# Patient Record
Sex: Female | Born: 1935 | Race: White | Hispanic: No | Marital: Married | Smoking: Never smoker
Health system: Southern US, Community
[De-identification: ages and names within clinical notes are randomized; demographics above are authoritative.]

## PROBLEM LIST (undated history)

## (undated) DIAGNOSIS — I10 Essential (primary) hypertension: Secondary | ICD-10-CM

## (undated) DIAGNOSIS — E1159 Type 2 diabetes mellitus with other circulatory complications: Secondary | ICD-10-CM

## (undated) DIAGNOSIS — I779 Disorder of arteries and arterioles, unspecified: Secondary | ICD-10-CM

## (undated) DIAGNOSIS — K3184 Gastroparesis: Secondary | ICD-10-CM

## (undated) DIAGNOSIS — K5790 Diverticulosis of intestine, part unspecified, without perforation or abscess without bleeding: Secondary | ICD-10-CM

## (undated) DIAGNOSIS — C8590 Non-Hodgkin lymphoma, unspecified, unspecified site: Secondary | ICD-10-CM

## (undated) DIAGNOSIS — C4490 Unspecified malignant neoplasm of skin, unspecified: Secondary | ICD-10-CM

## (undated) DIAGNOSIS — I152 Hypertension secondary to endocrine disorders: Secondary | ICD-10-CM

## (undated) DIAGNOSIS — E039 Hypothyroidism, unspecified: Secondary | ICD-10-CM

## (undated) DIAGNOSIS — R32 Unspecified urinary incontinence: Secondary | ICD-10-CM

## (undated) DIAGNOSIS — E109 Type 1 diabetes mellitus without complications: Secondary | ICD-10-CM

## (undated) DIAGNOSIS — K279 Peptic ulcer, site unspecified, unspecified as acute or chronic, without hemorrhage or perforation: Secondary | ICD-10-CM

## (undated) DIAGNOSIS — E119 Type 2 diabetes mellitus without complications: Secondary | ICD-10-CM

## (undated) DIAGNOSIS — K219 Gastro-esophageal reflux disease without esophagitis: Secondary | ICD-10-CM

## (undated) DIAGNOSIS — C8599 Non-Hodgkin lymphoma, unspecified, extranodal and solid organ sites: Secondary | ICD-10-CM

## (undated) DIAGNOSIS — N182 Chronic kidney disease, stage 2 (mild): Secondary | ICD-10-CM

## (undated) DIAGNOSIS — T7840XA Allergy, unspecified, initial encounter: Secondary | ICD-10-CM

## (undated) DIAGNOSIS — Z87442 Personal history of urinary calculi: Secondary | ICD-10-CM

## (undated) DIAGNOSIS — E785 Hyperlipidemia, unspecified: Secondary | ICD-10-CM

## (undated) DIAGNOSIS — M1990 Unspecified osteoarthritis, unspecified site: Secondary | ICD-10-CM

## (undated) DIAGNOSIS — G5603 Carpal tunnel syndrome, bilateral upper limbs: Secondary | ICD-10-CM

## (undated) DIAGNOSIS — M4800 Spinal stenosis, site unspecified: Secondary | ICD-10-CM

## (undated) HISTORY — DX: Gastro-esophageal reflux disease without esophagitis: K21.9

## (undated) HISTORY — PX: TONSILLECTOMY: SUR1361

## (undated) HISTORY — PX: CATARACT EXTRACTION, BILATERAL: SHX1313

## (undated) HISTORY — PX: OTHER SURGICAL HISTORY: SHX169

## (undated) HISTORY — DX: Type 2 diabetes mellitus without complications: E11.9

## (undated) HISTORY — DX: Peptic ulcer, site unspecified, unspecified as acute or chronic, without hemorrhage or perforation: K27.9

## (undated) HISTORY — DX: Non-Hodgkin lymphoma, unspecified, extranodal and solid organ sites: C85.99

## (undated) HISTORY — DX: Diverticulosis of intestine, part unspecified, without perforation or abscess without bleeding: K57.90

## (undated) HISTORY — DX: Allergy, unspecified, initial encounter: T78.40XA

## (undated) HISTORY — PX: BACK SURGERY: SHX140

## (undated) HISTORY — PX: EYE SURGERY: SHX253

## (undated) HISTORY — DX: Spinal stenosis, site unspecified: M48.00

## (undated) HISTORY — DX: Essential (primary) hypertension: I10

## (undated) HISTORY — DX: Hypothyroidism, unspecified: E03.9

## (undated) HISTORY — DX: Carpal tunnel syndrome, bilateral upper limbs: G56.03

## (undated) HISTORY — DX: Gastroparesis: K31.84

## (undated) HISTORY — PX: ABDOMINAL HYSTERECTOMY: SHX81

---

## 1958-01-03 HISTORY — PX: PARTIAL HYSTERECTOMY: SHX80

## 1978-02-03 HISTORY — PX: PARTIAL HYSTERECTOMY: SHX80

## 1980-01-04 HISTORY — PX: TONSILLECTOMY AND ADENOIDECTOMY: SUR1326

## 1992-01-04 HISTORY — PX: OTHER SURGICAL HISTORY: SHX169

## 1994-01-03 HISTORY — PX: OTHER SURGICAL HISTORY: SHX169

## 1997-01-03 HISTORY — PX: LUMBAR LAMINECTOMY: SHX95

## 1999-06-04 HISTORY — PX: LAPAROSCOPIC CHOLECYSTECTOMY: SUR755

## 2004-09-01 ENCOUNTER — Ambulatory Visit: Payer: Self-pay | Admitting: Unknown Physician Specialty

## 2005-02-22 ENCOUNTER — Ambulatory Visit: Payer: Self-pay | Admitting: Gastroenterology

## 2005-09-13 ENCOUNTER — Ambulatory Visit: Payer: Self-pay | Admitting: Unknown Physician Specialty

## 2006-11-15 ENCOUNTER — Ambulatory Visit: Payer: Self-pay | Admitting: Unknown Physician Specialty

## 2007-05-24 ENCOUNTER — Ambulatory Visit: Payer: Self-pay | Admitting: Unknown Physician Specialty

## 2007-05-24 HISTORY — PX: COLONOSCOPY: SHX174

## 2008-01-17 ENCOUNTER — Ambulatory Visit: Payer: Self-pay | Admitting: Unknown Physician Specialty

## 2008-10-03 HISTORY — PX: OTHER SURGICAL HISTORY: SHX169

## 2008-10-07 ENCOUNTER — Ambulatory Visit: Payer: Self-pay | Admitting: General Practice

## 2008-10-24 ENCOUNTER — Ambulatory Visit: Payer: Self-pay | Admitting: General Practice

## 2008-10-24 HISTORY — PX: CARPAL TUNNEL RELEASE: SHX101

## 2008-12-05 ENCOUNTER — Ambulatory Visit: Payer: Self-pay | Admitting: General Practice

## 2008-12-05 HISTORY — PX: KNEE ARTHROSCOPY: SHX127

## 2008-12-05 HISTORY — PX: OTHER SURGICAL HISTORY: SHX169

## 2008-12-23 ENCOUNTER — Encounter: Payer: Self-pay | Admitting: General Practice

## 2009-01-03 ENCOUNTER — Encounter: Payer: Self-pay | Admitting: General Practice

## 2009-01-27 ENCOUNTER — Ambulatory Visit: Payer: Self-pay | Admitting: Unknown Physician Specialty

## 2009-02-03 ENCOUNTER — Encounter: Payer: Self-pay | Admitting: General Practice

## 2009-04-20 ENCOUNTER — Ambulatory Visit: Payer: Self-pay | Admitting: Unknown Physician Specialty

## 2009-06-15 ENCOUNTER — Ambulatory Visit: Payer: Self-pay | Admitting: Anesthesiology

## 2009-06-19 ENCOUNTER — Ambulatory Visit: Payer: Self-pay | Admitting: General Practice

## 2009-06-19 HISTORY — PX: CARPAL TUNNEL RELEASE: SHX101

## 2009-06-19 HISTORY — PX: TRIGGER FINGER RELEASE: SHX641

## 2010-03-03 ENCOUNTER — Ambulatory Visit: Payer: Self-pay | Admitting: Unknown Physician Specialty

## 2010-04-25 ENCOUNTER — Emergency Department: Payer: Self-pay | Admitting: Emergency Medicine

## 2010-05-10 ENCOUNTER — Ambulatory Visit: Payer: Self-pay

## 2010-06-10 ENCOUNTER — Ambulatory Visit: Payer: Self-pay | Admitting: General Practice

## 2010-06-16 ENCOUNTER — Ambulatory Visit: Payer: Self-pay | Admitting: General Practice

## 2010-06-16 HISTORY — PX: KNEE ARTHROSCOPY: SHX127

## 2011-03-22 ENCOUNTER — Ambulatory Visit: Payer: Self-pay | Admitting: Internal Medicine

## 2011-12-09 ENCOUNTER — Encounter: Payer: Self-pay | Admitting: *Deleted

## 2011-12-12 ENCOUNTER — Ambulatory Visit (INDEPENDENT_AMBULATORY_CARE_PROVIDER_SITE_OTHER): Payer: Medicare Other | Admitting: Internal Medicine

## 2011-12-12 ENCOUNTER — Encounter: Payer: Self-pay | Admitting: Internal Medicine

## 2011-12-12 ENCOUNTER — Ambulatory Visit (INDEPENDENT_AMBULATORY_CARE_PROVIDER_SITE_OTHER)
Admission: RE | Admit: 2011-12-12 | Discharge: 2011-12-12 | Disposition: A | Discharge status: Home / Self Care | Payer: Medicare Other | Source: Ambulatory Visit | Attending: Internal Medicine | Admitting: Internal Medicine

## 2011-12-12 VITALS — BP 128/85 | HR 75 | Temp 98.6°F | Ht 65.0 in | Wt 196.0 lb

## 2011-12-12 DIAGNOSIS — R059 Cough, unspecified: Secondary | ICD-10-CM

## 2011-12-12 DIAGNOSIS — E7800 Pure hypercholesterolemia, unspecified: Secondary | ICD-10-CM

## 2011-12-12 DIAGNOSIS — E1165 Type 2 diabetes mellitus with hyperglycemia: Secondary | ICD-10-CM | POA: Insufficient documentation

## 2011-12-12 DIAGNOSIS — M4800 Spinal stenosis, site unspecified: Secondary | ICD-10-CM

## 2011-12-12 DIAGNOSIS — C8593 Non-Hodgkin lymphoma, unspecified, intra-abdominal lymph nodes: Secondary | ICD-10-CM | POA: Insufficient documentation

## 2011-12-12 DIAGNOSIS — C8583 Other specified types of non-Hodgkin lymphoma, intra-abdominal lymph nodes: Secondary | ICD-10-CM

## 2011-12-12 DIAGNOSIS — K5790 Diverticulosis of intestine, part unspecified, without perforation or abscess without bleeding: Secondary | ICD-10-CM | POA: Insufficient documentation

## 2011-12-12 DIAGNOSIS — K5730 Diverticulosis of large intestine without perforation or abscess without bleeding: Secondary | ICD-10-CM

## 2011-12-12 DIAGNOSIS — M549 Dorsalgia, unspecified: Secondary | ICD-10-CM

## 2011-12-12 DIAGNOSIS — E1065 Type 1 diabetes mellitus with hyperglycemia: Secondary | ICD-10-CM | POA: Insufficient documentation

## 2011-12-12 DIAGNOSIS — R05 Cough: Secondary | ICD-10-CM

## 2011-12-12 DIAGNOSIS — K279 Peptic ulcer, site unspecified, unspecified as acute or chronic, without hemorrhage or perforation: Secondary | ICD-10-CM

## 2011-12-12 DIAGNOSIS — C8599 Non-Hodgkin lymphoma, unspecified, extranodal and solid organ sites: Secondary | ICD-10-CM

## 2011-12-12 DIAGNOSIS — Z23 Encounter for immunization: Secondary | ICD-10-CM

## 2011-12-12 DIAGNOSIS — I10 Essential (primary) hypertension: Secondary | ICD-10-CM | POA: Insufficient documentation

## 2011-12-12 DIAGNOSIS — K3184 Gastroparesis: Secondary | ICD-10-CM | POA: Insufficient documentation

## 2011-12-12 DIAGNOSIS — E119 Type 2 diabetes mellitus without complications: Secondary | ICD-10-CM

## 2011-12-12 DIAGNOSIS — Z8579 Personal history of other malignant neoplasms of lymphoid, hematopoietic and related tissues: Secondary | ICD-10-CM | POA: Insufficient documentation

## 2011-12-12 DIAGNOSIS — E039 Hypothyroidism, unspecified: Secondary | ICD-10-CM | POA: Insufficient documentation

## 2011-12-12 NOTE — Patient Instructions (Addendum)
It was nice meeting you today.  We will get you scheduled for follow up labs and an appt with Dr Yves Dill - for your back.  Let me know if you need anything.

## 2011-12-12 NOTE — Progress Notes (Signed)
  Subjective:    Patient ID: Marissa Huff, female    DOB: 01/02/1932, 78 y.o.   MRN: [id number]  HPI 78 year old female with past history of hypertension, diabetes, and hypothyroidism who comes in today for a scheduled follow up and to establish care.  She sees Dr Tedd Sias for her diabetes.  States am sugars averaging 140.  Sugars before lunch 200.  Last a1c 6.7.  No chest pain or tightness.  No increased sob.  She does have pain in her lower mid to left back.  Limits her activity.  Has to sit and rest.  Saw Dr Michela Pitcher in [place].  Has tramadol to take prn.  Does not want surgery.  Went to [facility] pain [facility] and had an epidural.  Did not help.  Request referral for reevaluation and pain management.  She also reports she takes Protonix bid.  Still some increased gas at times, occasional acid reflux and occasional dysphagia.  States this has been present since her gastrectomy.  Discussed eating small meals and chewing food well.  Persistent cough.   Past Medical History  Diagnosis Date  . Diabetes mellitus without complication   . Hypothyroidism   . GERD (gastroesophageal reflux disease)   . Hypertension   . Gastric lymphoma   . Allergy   . Spinal stenosis     lumbar laminectomy  . PUD (peptic ulcer disease)   . Gastroparesis   . Diverticulosis   . Carpal tunnel syndrome, bilateral     Review of Systems Patient denies any headache, lightheadedness or dizziness.  No significant sinus or allergy symptoms   No chest pain, tightness or palpitations.  No increased shortness of breath or congestion. Does report some persistent cough.  No nausea or vomiting.  Some acid reflux as outlined.  No abdominal pain or cramping.  No bowel change, such as diarrhea, constipation, BRBPR or melana.  No urine change.  Back pain as outlined.  Some fatigue.  Sugars as outlined.         Objective:   Physical Exam Filed Vitals:   12/12/11 1321  BP: 128/85  Pulse: 75  Temp: 98.6 F (21 C)   78 year old female in no acute  distress.   HEENT:  Nares - clear.  OP- without lesions or erythema.  NECK:  Supple, nontender.  No audible bruit.   HEART:  Appears to be regular. LUNGS:  Without crackles or wheezing audible.  Respirations even and unlabored.   RADIAL PULSE:  Equal bilaterally.  ABDOMEN:  Soft, nontender.  No audible abdominal bruit.   EXTREMITIES:  No increased edema to be present.                   Assessment & Plan:  BACK PAIN.  Persistent. No radiation of pain.  Has been worked up in the past.  Had a previous MRI.  I do not have results.  Obtain records from [place] [facility].  She has been evaluated previously at [facility] pain clinic.  Had an epidural injection.  Request referral for pain management.  Will refer to Dr Yves Dill for evaluation and treatment.  FATIGUE.  Check cbc,met c and tsh.   PERSISTENT COUGH.  Protonix bid.  Check CXR.     HEALTH MAINTENANCE.  Will need to obtain outside records to review.  Schedule a physical - next visit.

## 2011-12-13 ENCOUNTER — Telehealth: Payer: Self-pay | Admitting: *Deleted

## 2011-12-13 NOTE — Telephone Encounter (Signed)
Patient notified

## 2011-12-13 NOTE — Telephone Encounter (Signed)
Message copied by Marlene Lard on Tue Dec 13, 2011  5:10 PM ------      Message from: Charm Barges      Created: Mon Dec 12, 2011  8:23 PM       Notify pt cxr negative.

## 2011-12-16 ENCOUNTER — Other Ambulatory Visit (INDEPENDENT_AMBULATORY_CARE_PROVIDER_SITE_OTHER): Payer: Medicare Other

## 2011-12-16 DIAGNOSIS — E119 Type 2 diabetes mellitus without complications: Secondary | ICD-10-CM

## 2011-12-16 DIAGNOSIS — E039 Hypothyroidism, unspecified: Secondary | ICD-10-CM

## 2011-12-16 DIAGNOSIS — C8599 Non-Hodgkin lymphoma, unspecified, extranodal and solid organ sites: Secondary | ICD-10-CM

## 2011-12-16 DIAGNOSIS — E7800 Pure hypercholesterolemia, unspecified: Secondary | ICD-10-CM

## 2011-12-16 DIAGNOSIS — C8583 Other specified types of non-Hodgkin lymphoma, intra-abdominal lymph nodes: Secondary | ICD-10-CM

## 2011-12-16 DIAGNOSIS — I10 Essential (primary) hypertension: Secondary | ICD-10-CM

## 2011-12-16 LAB — HEPATIC FUNCTION PANEL
ALT: 17 U/L (ref 0–35)
AST: 16 U/L (ref 0–37)
Albumin: 4 g/dL (ref 3.5–5.2)
Alkaline Phosphatase: 79 U/L (ref 39–117)
Bilirubin, Direct: 0.1 mg/dL (ref 0.0–0.3)
Total Bilirubin: 0.6 mg/dL (ref 0.3–1.2)
Total Protein: 7 g/dL (ref 6.0–8.3)

## 2011-12-16 LAB — CBC WITH DIFFERENTIAL/PLATELET
Basophils Absolute: 0 10*3/uL (ref 0.0–0.1)
Basophils Relative: 0.5 % (ref 0.0–3.0)
Eosinophils Absolute: 0.1 10*3/uL (ref 0.0–0.7)
Eosinophils Relative: 2.4 % (ref 0.0–5.0)
HCT: 35.8 % — ABNORMAL LOW (ref 36.0–46.0)
Hemoglobin: 12.3 g/dL (ref 12.0–15.0)
Lymphocytes Relative: 17.1 % (ref 12.0–46.0)
Lymphs Abs: 0.9 10*3/uL (ref 0.7–4.0)
MCHC: 34.4 g/dL (ref 30.0–36.0)
MCV: 87.7 fl (ref 78.0–100.0)
Monocytes Absolute: 0.5 10*3/uL (ref 0.1–1.0)
Monocytes Relative: 9.3 % (ref 3.0–12.0)
Neutro Abs: 3.8 10*3/uL (ref 1.4–7.7)
Neutrophils Relative %: 70.7 % (ref 43.0–77.0)
Platelets: 209 10*3/uL (ref 150.0–400.0)
RBC: 4.08 Mil/uL (ref 3.87–5.11)
RDW: 13.3 % (ref 11.5–14.6)
WBC: 5.4 10*3/uL (ref 4.5–10.5)

## 2011-12-16 LAB — LIPID PANEL
Cholesterol: 129 mg/dL (ref 0–200)
HDL: 36.3 mg/dL — ABNORMAL LOW (ref >39.00)
LDL Cholesterol: 77 mg/dL (ref 0–99)
Total CHOL/HDL Ratio: 4
Triglycerides: 80 mg/dL (ref 0.0–149.0)
VLDL: 16 mg/dL (ref 0.0–40.0)

## 2011-12-16 LAB — TSH: TSH: 2.13 u[IU]/mL (ref 0.35–5.50)

## 2011-12-16 LAB — BASIC METABOLIC PANEL
BUN: 22 mg/dL (ref 6–23)
CO2: 28 mEq/L (ref 19–32)
Calcium: 9.4 mg/dL (ref 8.4–10.5)
Chloride: 101 mEq/L (ref 96–112)
Creatinine, Ser: 0.7 mg/dL (ref 0.4–1.2)
GFR: 80.51 mL/min (ref >60.00)
Glucose, Bld: 268 mg/dL — ABNORMAL HIGH (ref 70–99)
Potassium: 3.8 mEq/L (ref 3.5–5.1)
Sodium: 136 mEq/L (ref 135–145)

## 2011-12-16 LAB — HEMOGLOBIN A1C: Hgb A1c MFr Bld: 7.6 % — ABNORMAL HIGH (ref 4.6–6.5)

## 2011-12-17 ENCOUNTER — Encounter: Payer: Self-pay | Admitting: Internal Medicine

## 2011-12-17 ENCOUNTER — Telehealth: Payer: Self-pay | Admitting: Internal Medicine

## 2011-12-17 NOTE — Assessment & Plan Note (Signed)
Blood pressure under control. Same medication.  Check metabolic panel.    

## 2011-12-17 NOTE — Assessment & Plan Note (Signed)
Low cholesterol diet and exercise.  Follow.   

## 2011-12-17 NOTE — Assessment & Plan Note (Addendum)
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  Follow. Some reflux as outlined.  Protonix bid. Follow.

## 2011-12-17 NOTE — Assessment & Plan Note (Signed)
On thyroid replacement.  Check tsh.  

## 2011-12-17 NOTE — Telephone Encounter (Signed)
Pt notified of lab results via my chart messaging.  

## 2011-12-17 NOTE — Assessment & Plan Note (Signed)
Continue protonix bid.   

## 2011-12-17 NOTE — Assessment & Plan Note (Signed)
See above.  Persistent intermittent back pain.  Will refer to Dr Yves Dill.  Follow.

## 2011-12-17 NOTE — Assessment & Plan Note (Signed)
Currently no bowel issues reported.  States her last colonoscopy was 2010.  Follow.    

## 2011-12-17 NOTE — Assessment & Plan Note (Signed)
Sees Dr Tedd Sias.  Sugars as outlined.  Last A1c 6.7.  Check met b and a1c.  States she is up to date with eye exams.  Seen at [facility].  Followed at [facility] for her foot exams.  States seen this year.

## 2012-01-04 HISTORY — PX: CATARACT EXTRACTION, BILATERAL: SHX1313

## 2012-01-09 ENCOUNTER — Ambulatory Visit: Payer: Self-pay | Admitting: Physical Medicine and Rehabilitation

## 2012-01-27 ENCOUNTER — Telehealth: Payer: Self-pay | Admitting: Internal Medicine

## 2012-01-27 ENCOUNTER — Encounter: Payer: Self-pay | Admitting: Adult Health

## 2012-01-27 ENCOUNTER — Ambulatory Visit (INDEPENDENT_AMBULATORY_CARE_PROVIDER_SITE_OTHER): Payer: Medicare Other | Admitting: Adult Health

## 2012-01-27 VITALS — BP 122/72 | HR 74 | Temp 98.3°F | Resp 14 | Ht 65.0 in | Wt 194.0 lb

## 2012-01-27 DIAGNOSIS — N390 Urinary tract infection, site not specified: Secondary | ICD-10-CM

## 2012-01-27 LAB — POCT URINALYSIS DIPSTICK
Bilirubin, UA: NEGATIVE
Glucose, UA: NEGATIVE
Ketones, UA: NEGATIVE
Nitrite, UA: POSITIVE
Protein, UA: NEGATIVE
Spec Grav, UA: 1.015
Urobilinogen, UA: 0.2
pH, UA: 5

## 2012-01-27 MED ORDER — CIPROFLOXACIN HCL 250 MG PO TABS: 250.0000 mg | ORAL_TABLET | Freq: Two times a day (BID) | ORAL | Status: DC

## 2012-01-27 NOTE — Patient Instructions (Addendum)
  I have sent in a prescription for an antibiotic to your pharmacy. Please start the antibiotic today.  You can also take AZO or Urostat (or a generic brand)  for urinary symptoms. You can take this for 3 days as needed but usually 1 or 2 doses will help your symptoms.  Drink plenty of water.  Please call if you are not feeling better by Monday.

## 2012-01-27 NOTE — Progress Notes (Signed)
  Subjective:    Patient ID: Marissa Huff, female    DOB: [birth year], 79 y.o.   MRN: [id number]  HPI  Patient is a pleasant 79 y/o female who presents to clinic today with c/o dysuria, urgency and frequency. She reports she has had several UTIs in the past and this feels exactly the same. She denies fever, chills or abdominal/pelvic pain.  Current Outpatient Prescriptions on File Prior to Visit  Medication Sig Dispense Refill  . amLODipine (NORVASC) 5 MG tablet Take 5 mg by mouth daily.      . Calcium Carbonate-Vitamin D (CALCIUM 500 + D PO) Take by mouth 2 (two) times daily.       . Cholecalciferol (VITAMIN D-3) 1000 UNITS CAPS Take by mouth daily.       . diphenhydramine-acetaminophen (TYLENOL PM) 25-500 MG TABS Take 1 tablet by mouth at bedtime as needed.      Marland Kitchen estradiol (ESTRACE) 0.1 MG/GM vaginal cream Insert vaginally 3x/week      . fexofenadine (ALLEGRA) 180 MG tablet Take 180 mg by mouth daily.      . fluticasone (FLONASE) 50 MCG/ACT nasal spray Place 2 sprays into the nose daily.      . insulin glargine (LANTUS) 100 UNIT/ML injection Inject 30 Units into the skin at bedtime.       . insulin lispro (HUMALOG) 100 UNIT/ML injection Inject into the skin 3 (three) times daily before meals. 14 units with breakfast and 12 dinner; 12 units with lunch      . levothyroxine (SYNTHROID, LEVOTHROID) 150 MCG tablet Take 150 mcg by mouth daily.      . Liraglutide 18 MG/3ML SOLN Inject 1.8 mg into the skin.      . metFORMIN (GLUCOPHAGE) 500 MG tablet Take 2 tablets bid      . Multiple Vitamins-Minerals (MULTIVITAMIN PO) Take by mouth daily.       . pantoprazole (PROTONIX) 40 MG tablet Take 40 mg by mouth 2 (two) times daily.      . traMADol (ULTRAM) 50 MG tablet 1 to 2 tabs every 4 to six hours      . valsartan-hydrochlorothiazide (DIOVAN-HCT) 160-12.5 MG per tablet Take 1 tablet by mouth daily.      Marland Kitchen acetaminophen (TYLENOL) 500 MG tablet Take 500 mg by mouth every 6 (six) hours as needed.      .  pioglitazone (ACTOS) 45 MG tablet .5 tab daily         Review of Systems  Constitutional: Negative.   Respiratory: Negative.   Cardiovascular: Negative.   Gastrointestinal: Negative.   Genitourinary: Positive for dysuria, urgency and frequency. Negative for flank pain and pelvic pain.  Neurological: Negative.   Psychiatric/Behavioral: Negative.         Objective:   Physical Exam  Constitutional: She is oriented to person, place, and time. She appears well-developed and well-nourished. No distress.  Cardiovascular: Normal rate and regular rhythm.   Pulmonary/Chest: Effort normal and breath sounds normal.  Abdominal: Soft.  Neurological: She is alert and oriented to person, place, and time.  Skin: Skin is warm and dry.  Psychiatric: She has a normal mood and affect. Her behavior is normal. Judgment and thought content normal.          Assessment & Plan:

## 2012-01-27 NOTE — Assessment & Plan Note (Signed)
UA dip + nitrites, leukocytes. Urine sent for culture. Start cipro x 3 days. Pyridium for urinary symptoms. RTC if no improvement by Monday.

## 2012-01-27 NOTE — Telephone Encounter (Signed)
Patient Information:  Caller Name: Nadeen  Phone: [phone]  Patient: Marissa Huff, Marissa Huff  Gender: Female  DOB: 19-Jan-1931  Age: 79 Years  PCP: Dale Cambria  Office Follow Up:  Does the office need to follow up with this patient?: No  Instructions For The Office: N/A   Symptoms  Reason For Call & Symptoms: Burning and pain with urination and going frequently.  Reviewed Health History In EMR: Yes  Reviewed Medications In EMR: Yes  Reviewed Allergies In EMR: Yes  Reviewed Surgeries / Procedures: Yes  Date of Onset of Symptoms: 01/25/2012  Guideline(s) Used:  Urination Pain - Female  Disposition Per Guideline:   See Today in Office  Reason For Disposition Reached:   > 2 UTIs in last year  Advice Given:  Fluids:   Drink extra fluids. Drink 8-10 glasses of liquids a day (Reason: to produce a dilute, non-irritating urine).  Cranberry Juice:   Some people think that drinking cranberry juice may help in fighting urinary tract infections. However, there is no good research that has ever proved this.  Dosage Cranberry Juice Cocktail: 8 oz (240 ml) twice a day.  Dosage 100% Cranberry Juice: 1 oz (30 ml) twice a day.  Warm Saline SITZ Baths to Reduce Pain:  Sit in a warm saline bath for 20 minutes to cleanse the area and to reduce pain. Add 2 oz. of table salt or baking soda to a tub of water.  Call Back If:  You become worse.  Cranberry Juice:   Some people think that drinking cranberry juice may help in fighting urinary tract infections. However, there is no good research that has ever proved this.  Dosage Cranberry Juice Cocktail: 8 oz (240 ml) twice a day.  Dosage 100% Cranberry Juice: 1 oz (30 ml) twice a day.  Caution: Do not drink more than 16 oz (480 ml). Here is the reason: too much cranberry juice can also be irritating to the bladder.  Appointment Scheduled:  01/27/2012 15:45:00 Appointment Scheduled Provider:  Orville Govern

## 2012-01-30 LAB — URINE CULTURE: Colony Count: >=100000

## 2012-02-06 ENCOUNTER — Telehealth: Payer: Self-pay | Admitting: Internal Medicine

## 2012-02-06 NOTE — Telephone Encounter (Signed)
Patient Information:  Caller Name: Zoanne  Phone: [phone]  Patient: Marissa Huff, Marissa Huff  Gender: Female  DOB: [birth year]  Age: 79 Years  PCP: Dale [place]  Office Follow Up:  Does the office need to follow up with this patient?: yes  Instructions For The Office: Requesting full course of antibitoic be called into [facility] on [address] In [place]   Symptoms  Reason For Call & Symptoms: Burning and pain with urination and going frequently. Was treated with only 6 pills of Cipro on 01/27/12 and normally takes it for one week.   Reviewed Health History In EMR: Yes  Reviewed Medications In EMR: Yes  Reviewed Allergies In EMR: Yes  Reviewed Surgeries / Procedures: Yes  Date of Onset of Symptoms: 02/05/2012  Guideline(s) Used:  Urination Pain - Female  Disposition Per Guideline:   See Today in Office  Reason For Disposition Reached:   > 2 UTIs in last year  Advice Given:  Fluids:   Drink extra fluids. Drink 8-10 glasses of liquids a day (Reason: to produce a dilute, non-irritating urine).  Cranberry Juice:   Some people think that drinking cranberry juice may help in fighting urinary tract infections. However, there is no good research that has ever proved this.  Dosage Cranberry Juice Cocktail: 8 oz (240 ml) twice a day.  Dosage 100% Cranberry Juice: 1 oz (30 ml) twice a day.  Warm Saline SITZ Baths to Reduce Pain:  Sit in a warm saline bath for 20 minutes to cleanse the area and to reduce pain. Add 2 oz. of table salt or baking soda to a tub of water.  Call Back If:  You become worse.  Cranberry Juice:   Some people think that drinking cranberry juice may help in fighting urinary tract infections. However, there is no good research that has ever proved this.  Dosage Cranberry Juice Cocktail: 8 oz (240 ml) twice a day.  Dosage 100% Cranberry Juice: 1 oz (30 ml) twice a day.  Caution: Do not drink more than 16 oz (480 ml). Here is the reason: too much cranberry juice can also be  irritating to the bladder. Last Appointment Scheduled:  01/27/2012 15:45:00

## 2012-02-07 NOTE — Telephone Encounter (Signed)
Given persistent sx - let me reevaluate her.  See if she can come in tomorrow (just before 12:30).  Urine prior to being seen.  Thanks.  Make sure she is ok with waiting.

## 2012-02-07 NOTE — Telephone Encounter (Signed)
Patient notified.  Appointment scheduled.

## 2012-02-07 NOTE — Telephone Encounter (Signed)
Pt has not received call back after speaking with Call  A Nurse adn she thinks she may have UTI ??

## 2012-02-08 ENCOUNTER — Ambulatory Visit (INDEPENDENT_AMBULATORY_CARE_PROVIDER_SITE_OTHER): Payer: Medicare Other | Admitting: Internal Medicine

## 2012-02-08 ENCOUNTER — Encounter: Payer: Self-pay | Admitting: Internal Medicine

## 2012-02-08 ENCOUNTER — Ambulatory Visit: Payer: Medicare Other | Admitting: Internal Medicine

## 2012-02-08 VITALS — BP 138/60 | HR 65 | Temp 97.9°F | Ht 65.0 in | Wt 193.2 lb

## 2012-02-08 DIAGNOSIS — N390 Urinary tract infection, site not specified: Secondary | ICD-10-CM

## 2012-02-08 DIAGNOSIS — I10 Essential (primary) hypertension: Secondary | ICD-10-CM

## 2012-02-08 LAB — POCT URINALYSIS DIPSTICK
Bilirubin, UA: NEGATIVE
Blood, UA: NEGATIVE
Glucose, UA: 250
Ketones, UA: NEGATIVE
Nitrite, UA: POSITIVE
Protein, UA: NEGATIVE
Spec Grav, UA: 1.015
Urobilinogen, UA: 0.2
pH, UA: 6.5

## 2012-02-08 MED ORDER — CIPROFLOXACIN HCL 500 MG PO TABS: 500.0000 mg | ORAL_TABLET | Freq: Two times a day (BID) | ORAL | Status: DC

## 2012-02-08 NOTE — Progress Notes (Signed)
  Subjective:    Patient ID: Marissa Huff, female    DOB: 10-09-1931, 79 y.o.   MRN: [id number]  Urinary Tract Infection   79 year old female with past history of hypertension, diabetes, and hypothyroidism who comes in today as a work in with concerns regarding a possible urinary tract infection. She states she saw Raquel on 1/24.  Was diagnosed with a uti and treated with three days of cipro. Symptoms improved, but then she notices return of symptoms a few days ago.  Describes dysuria and increased frequency.  No hematuria.  No abdominal pain.  No nausea or vomiting.  No bowel change.  Eating and drinking.     Past Medical History  Diagnosis Date  . Diabetes mellitus without complication   . Hypothyroidism   . GERD (gastroesophageal reflux disease)   . Hypertension   . Gastric lymphoma   . Allergy   . Spinal stenosis     lumbar laminectomy  . PUD (peptic ulcer disease)   . Gastroparesis   . Diverticulosis   . Carpal tunnel syndrome, bilateral     Review of Systems Patient denies any headache or fever.  Eating and drinking.  No nausea or vomiting.  No bowel change.  No vaginal symptoms.  Some dysuria and increased frequency.  No hematuria.  Has chronic back pain.  No change.  She reports that she has had several urinary tract infections this year - prior to this one.          Objective:   Physical Exam  Filed Vitals:   02/08/12 1136  BP: 138/60  Pulse: 65  Temp: 97.9 F (62.63 C)   79 year old female in no acute distress.  HEART:  Appears to be regular. LUNGS:  Without crackles or wheezing audible.  Respirations even and unlabored.   ABDOMEN:  Soft, nontender.  Palpable nodules c/w insulin injection sites.   BACK:  Nontender.  No cva tenderness.                    Assessment & Plan:  UTI.  Urinalysis consistent with a uti.  Treat with cipro 500mg  bid x 1 week.  Will send urine for culture.   Will refer to urology given persistent reoccurring urinary tract infections.     ABDOMINAL WALL NODULES.  She had questions regarding these nodules.  Appear to be c/w insulin injection sites.

## 2012-02-08 NOTE — Assessment & Plan Note (Signed)
Blood pressure under good control.  Follow.   

## 2012-02-09 ENCOUNTER — Ambulatory Visit: Payer: Self-pay | Admitting: Ophthalmology

## 2012-02-11 LAB — URINE CULTURE: Colony Count: >=100000

## 2012-02-12 ENCOUNTER — Telehealth: Payer: Self-pay | Admitting: Internal Medicine

## 2012-02-12 NOTE — Telephone Encounter (Signed)
Pt notified of urine culture results via my chart messaging.

## 2012-02-15 ENCOUNTER — Ambulatory Visit: Payer: Self-pay | Admitting: Ophthalmology

## 2012-02-20 ENCOUNTER — Other Ambulatory Visit: Payer: Self-pay | Admitting: *Deleted

## 2012-02-21 ENCOUNTER — Other Ambulatory Visit: Payer: Self-pay | Admitting: *Deleted

## 2012-02-21 MED ORDER — LEVOTHYROXINE SODIUM 150 MCG PO TABS: 150.0000 ug | ORAL_TABLET | Freq: Every day | ORAL | Status: DC

## 2012-02-21 MED ORDER — METFORMIN HCL 500 MG PO TABS: ORAL_TABLET | ORAL | Status: DC

## 2012-02-21 MED ORDER — AMLODIPINE BESYLATE 5 MG PO TABS: 5.0000 mg | ORAL_TABLET | Freq: Every day | ORAL | Status: DC

## 2012-02-21 MED ORDER — VALSARTAN-HYDROCHLOROTHIAZIDE 160-12.5 MG PO TABS: 1.0000 | ORAL_TABLET | Freq: Every day | ORAL | Status: DC

## 2012-02-21 NOTE — Telephone Encounter (Signed)
Sent in to pharmacy.  

## 2012-02-27 DIAGNOSIS — N3020 Other chronic cystitis without hematuria: Secondary | ICD-10-CM | POA: Insufficient documentation

## 2012-02-27 DIAGNOSIS — R399 Unspecified symptoms and signs involving the genitourinary system: Secondary | ICD-10-CM | POA: Insufficient documentation

## 2012-02-27 DIAGNOSIS — R339 Retention of urine, unspecified: Secondary | ICD-10-CM | POA: Insufficient documentation

## 2012-02-27 DIAGNOSIS — N3946 Mixed incontinence: Secondary | ICD-10-CM | POA: Insufficient documentation

## 2012-03-14 ENCOUNTER — Ambulatory Visit: Payer: Self-pay | Admitting: Ophthalmology

## 2012-03-26 ENCOUNTER — Ambulatory Visit: Payer: Self-pay | Admitting: Urology

## 2012-03-26 LAB — CREATININE, SERUM
Creatinine: 0.94 mg/dL (ref 0.60–1.30)
EGFR (African American): >60
EGFR (Non-African Amer.): 58 — ABNORMAL LOW

## 2012-04-10 ENCOUNTER — Encounter: Payer: Self-pay | Admitting: Internal Medicine

## 2012-04-12 ENCOUNTER — Encounter: Payer: Self-pay | Admitting: Internal Medicine

## 2012-04-12 ENCOUNTER — Ambulatory Visit (INDEPENDENT_AMBULATORY_CARE_PROVIDER_SITE_OTHER): Payer: Medicare Other | Admitting: Internal Medicine

## 2012-04-12 VITALS — BP 130/60 | HR 70 | Temp 97.7°F | Resp 18 | Wt 194.5 lb

## 2012-04-12 DIAGNOSIS — I10 Essential (primary) hypertension: Secondary | ICD-10-CM

## 2012-04-12 DIAGNOSIS — C8599 Non-Hodgkin lymphoma, unspecified, extranodal and solid organ sites: Secondary | ICD-10-CM

## 2012-04-12 DIAGNOSIS — Z1239 Encounter for other screening for malignant neoplasm of breast: Secondary | ICD-10-CM

## 2012-04-12 DIAGNOSIS — E039 Hypothyroidism, unspecified: Secondary | ICD-10-CM

## 2012-04-12 DIAGNOSIS — E7800 Pure hypercholesterolemia, unspecified: Secondary | ICD-10-CM

## 2012-04-12 DIAGNOSIS — K279 Peptic ulcer, site unspecified, unspecified as acute or chronic, without hemorrhage or perforation: Secondary | ICD-10-CM

## 2012-04-12 DIAGNOSIS — K5730 Diverticulosis of large intestine without perforation or abscess without bleeding: Secondary | ICD-10-CM

## 2012-04-12 DIAGNOSIS — K5790 Diverticulosis of intestine, part unspecified, without perforation or abscess without bleeding: Secondary | ICD-10-CM

## 2012-04-12 DIAGNOSIS — M4800 Spinal stenosis, site unspecified: Secondary | ICD-10-CM

## 2012-04-12 DIAGNOSIS — E119 Type 2 diabetes mellitus without complications: Secondary | ICD-10-CM

## 2012-04-12 DIAGNOSIS — C8583 Other specified types of non-Hodgkin lymphoma, intra-abdominal lymph nodes: Secondary | ICD-10-CM

## 2012-04-12 LAB — HM PAP SMEAR

## 2012-04-13 LAB — HM DIABETES EYE EXAM

## 2012-04-15 ENCOUNTER — Encounter: Payer: Self-pay | Admitting: Internal Medicine

## 2012-04-15 ENCOUNTER — Telehealth: Payer: Self-pay | Admitting: Internal Medicine

## 2012-04-15 MED ORDER — LEVOTHYROXINE SODIUM 150 MCG PO TABS: 150.0000 ug | ORAL_TABLET | Freq: Every day | ORAL | Status: DC

## 2012-04-15 MED ORDER — AMLODIPINE BESYLATE 5 MG PO TABS: 5.0000 mg | ORAL_TABLET | Freq: Every day | ORAL | Status: DC

## 2012-04-15 MED ORDER — PANTOPRAZOLE SODIUM 40 MG PO TBEC: 40.0000 mg | DELAYED_RELEASE_TABLET | Freq: Two times a day (BID) | ORAL | Status: DC

## 2012-04-15 NOTE — Assessment & Plan Note (Signed)
Continue protonix bid.   

## 2012-04-15 NOTE — Assessment & Plan Note (Signed)
Has been seeing Dr Tedd Sias.  Did not bring in any recorded sugar readings.  Last a1c 7.6.  Discussed importance of eating regular meals, appropriate snacks.  Need to see some readings.    Check met b and a1c.  States she is up to date with eye exams.  Seen at [facility].  Followed at [facility] for her foot exams.  Reports no significant lows.  Appears to have more trouble in the pm.

## 2012-04-15 NOTE — Progress Notes (Signed)
Subjective:    Patient ID: Marissa Huff, female    DOB: 1931/09/18, 79 y.o.   MRN: [id number]  HPI 79 year old female with past history of hypertension, diabetes, and hypothyroidism who comes in today to follow up on these issues as well as for a complete physical exam.  She has been seeing  Dr Tedd Sias for her diabetes.  States am sugars averaging 130-150.  Sugars in the pm vary.  May be 200. Last a1c 7.6.  No chest pain or tightness.  No increased sob.  She does have pain in her lower mid to left back.  Limits her activity.  Has to sit and rest.  Saw Dr Michela Pitcher in [place].  Has tramadol to take prn.  Does not want surgery.  Went to [facility] pain clinic and had an epidural.  Did not help.  She also reports she takes Protonix bid.  She is being evaluated by Dr Achilles Dunk. Had a CT.  States had an abnormal kidney.  Had stones.  Is s/p cystoscopy.  On daily suppressive abx now for 3 months.  Currently without urinary symptoms.     Past Medical History  Diagnosis Date  . Diabetes mellitus without complication   . Hypothyroidism   . GERD (gastroesophageal reflux disease)   . Hypertension   . Gastric lymphoma   . Allergy   . Spinal stenosis     lumbar laminectomy  . PUD (peptic ulcer disease)   . Gastroparesis   . Diverticulosis   . Carpal tunnel syndrome, bilateral     Current Outpatient Prescriptions on File Prior to Visit  Medication Sig Dispense Refill  . Calcium Carbonate-Vitamin D (CALCIUM 500 + D PO) Take by mouth 2 (two) times daily.       . diphenhydramine-acetaminophen (TYLENOL PM) 25-500 MG TABS Take 1 tablet by mouth at bedtime as needed.      . fexofenadine (ALLEGRA) 180 MG tablet Take 180 mg by mouth daily.      . fluticasone (FLONASE) 50 MCG/ACT nasal spray Place 2 sprays into the nose daily.      . insulin glargine (LANTUS) 100 UNIT/ML injection Inject 30 Units into the skin at bedtime.       . insulin lispro (HUMALOG) 100 UNIT/ML injection Inject into the skin 3 (three) times daily before  meals. 14 units with breakfast and 12 dinner; 12 units with lunch      . Liraglutide 18 MG/3ML SOLN Inject 1.8 mg into the skin.      . metFORMIN (GLUCOPHAGE) 500 MG tablet Take 2 tablets bid  180 tablet  1  . Multiple Vitamins-Minerals (MULTIVITAMIN PO) Take by mouth daily.       . Multiple Vitamins-Minerals (PRESERVISION AREDS PO) Take by mouth 2 (two) times daily.      . traMADol (ULTRAM) 50 MG tablet 1 to 2 tabs every 4 to six hours      . valsartan-hydrochlorothiazide (DIOVAN-HCT) 160-12.5 MG per tablet Take 1 tablet by mouth daily.  90 tablet  1  . acetaminophen (TYLENOL) 500 MG tablet Take 500 mg by mouth every 6 (six) hours as needed.      . Cholecalciferol (VITAMIN D-3) 1000 UNITS CAPS Take by mouth daily.       . ciprofloxacin (CIPRO) 250 MG tablet Take 1 tablet (250 mg total) by mouth 2 (two) times daily.  6 tablet  0  . ciprofloxacin (CIPRO) 500 MG tablet Take 1 tablet (500 mg total) by mouth 2 (two) times daily.  14 tablet  0  . estradiol (ESTRACE) 0.1 MG/GM vaginal cream Insert vaginally 3x/week      . pioglitazone (ACTOS) 45 MG tablet .5 tab daily       No current facility-administered medications on file prior to visit.    Review of Systems Patient denies any headache, lightheadedness or dizziness.  No significant sinus or allergy symptoms   No chest pain, tightness or palpitations.  No increased shortness of breath or congestion. No nausea or vomiting.  No acid reflux reported today.  No abdominal pain or cramping.  No bowel change, such as diarrhea, constipation, BRBPR or melana.  No urine change.  Back pain as outlined.   Sugars as outlined.         Objective:   Physical Exam  Filed Vitals:   04/12/12 1033  BP: 130/60  Pulse: 70  Temp: 97.7 F (36.5 C)  Resp: 18   Blood pressure recheck:  40/15  79 year old female in no acute distress.   HEENT:  Nares- clear.  Oropharynx - without lesions. NECK:  Supple.  Nontender.  No audible bruit.  HEART:  Appears to be  regular. LUNGS:  No crackles or wheezing audible.  Respirations even and unlabored.  RADIAL PULSE:  Equal bilaterally.    BREASTS:  No nipple discharge or nipple retraction present.  Could not appreciate any distinct nodules or axillary adenopathy.  ABDOMEN:  Soft, nontender.  Bowel sounds present and normal.  No audible abdominal bruit.  GU:  Normal external genitalia.  Vaginal vault without lesions.  S/P hysterectomy.  Could not appreciate any adnexal masses or tenderness.   RECTAL:  Heme negative.   EXTREMITIES:  No increased edema present.  DP pulses palpable and equal bilaterally.    SKIN:  Feet without lesions.        Assessment & Plan:  BACK PAIN.  Persistent. No radiation of pain.  Has been worked up in the past.  Had a previous MRI.  She has been evaluated previously at [facility] pain clinic.  Had an epidural injection.  Was referred to Dr Yves Dill last visit.   HEALTH MAINTENANCE.  Physical today.   Schedule mammogram.  Last mammogram 03/22/11 - BiRADS II.

## 2012-04-15 NOTE — Assessment & Plan Note (Signed)
Blood pressure under control. Same medication.  Check metabolic panel.    

## 2012-04-15 NOTE — Assessment & Plan Note (Signed)
Low cholesterol diet and exercise.  Follow.  Check lipid panel next fasting labs.     

## 2012-04-15 NOTE — Assessment & Plan Note (Signed)
See last note.  Persistent intermittent back pain.  Was referred to Dr Yves Dill.  Follow.

## 2012-04-15 NOTE — Assessment & Plan Note (Addendum)
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  Follow.  No acid reflux or upper symptoms reported today.  Follow.   

## 2012-04-15 NOTE — Assessment & Plan Note (Signed)
On thyroid replacement.  TSH just checked 12/13 - within normal limits.

## 2012-04-15 NOTE — Assessment & Plan Note (Signed)
Currently no bowel issues reported.  States her last colonoscopy was 2010.  Follow.    

## 2012-04-15 NOTE — Telephone Encounter (Signed)
Pt had wanted a refill on some of her meds [facility].  I sent everything except the metformin.  Need to clarify how she is taking the metformin before refill.  Thanks.

## 2012-04-16 MED ORDER — METFORMIN HCL 500 MG PO TABS: 500.0000 mg | ORAL_TABLET | Freq: Two times a day (BID) | ORAL | Status: DC

## 2012-04-16 NOTE — Telephone Encounter (Signed)
Called patient and she said she is taking the Metformin bid one after breakfast and one after dinner meal.

## 2012-04-16 NOTE — Telephone Encounter (Signed)
rx sent in for metformin.   

## 2012-04-16 NOTE — Telephone Encounter (Signed)
Clarify if 500mg  one tablet bid or two tablets bid

## 2012-04-16 NOTE — Telephone Encounter (Signed)
500 mg one tablet bid.Marland KitchenMarland Kitchen

## 2012-04-26 ENCOUNTER — Other Ambulatory Visit (INDEPENDENT_AMBULATORY_CARE_PROVIDER_SITE_OTHER): Payer: Medicare Other

## 2012-04-26 DIAGNOSIS — E119 Type 2 diabetes mellitus without complications: Secondary | ICD-10-CM

## 2012-04-26 DIAGNOSIS — E7800 Pure hypercholesterolemia, unspecified: Secondary | ICD-10-CM

## 2012-04-26 LAB — MICROALBUMIN / CREATININE URINE RATIO
Creatinine,U: 109.9 mg/dL
Microalb Creat Ratio: 0.5 mg/g (ref 0.0–30.0)
Microalb, Ur: 0.5 mg/dL (ref 0.0–1.9)

## 2012-04-26 LAB — LIPID PANEL
Cholesterol: 143 mg/dL (ref 0–200)
HDL: 38.6 mg/dL — ABNORMAL LOW (ref >39.00)
LDL Cholesterol: 84 mg/dL (ref 0–99)
Total CHOL/HDL Ratio: 4
Triglycerides: 101 mg/dL (ref 0.0–149.0)
VLDL: 20.2 mg/dL (ref 0.0–40.0)

## 2012-04-26 LAB — HEPATIC FUNCTION PANEL
ALT: 17 U/L (ref 0–35)
AST: 17 U/L (ref 0–37)
Albumin: 4 g/dL (ref 3.5–5.2)
Alkaline Phosphatase: 77 U/L (ref 39–117)
Bilirubin, Direct: 0.1 mg/dL (ref 0.0–0.3)
Total Bilirubin: 0.7 mg/dL (ref 0.3–1.2)
Total Protein: 6.8 g/dL (ref 6.0–8.3)

## 2012-04-26 LAB — BASIC METABOLIC PANEL
BUN: 20 mg/dL (ref 6–23)
CO2: 25 mEq/L (ref 19–32)
Calcium: 9.3 mg/dL (ref 8.4–10.5)
Chloride: 103 mEq/L (ref 96–112)
Creatinine, Ser: 0.8 mg/dL (ref 0.4–1.2)
GFR: 79.2 mL/min (ref >60.00)
Glucose, Bld: 181 mg/dL — ABNORMAL HIGH (ref 70–99)
Potassium: 3.7 mEq/L (ref 3.5–5.1)
Sodium: 137 mEq/L (ref 135–145)

## 2012-04-26 LAB — HEMOGLOBIN A1C: Hgb A1c MFr Bld: 7.2 % — ABNORMAL HIGH (ref 4.6–6.5)

## 2012-04-27 ENCOUNTER — Encounter: Payer: Self-pay | Admitting: Internal Medicine

## 2012-05-17 ENCOUNTER — Ambulatory Visit: Payer: Self-pay | Admitting: Internal Medicine

## 2012-05-17 LAB — HM MAMMOGRAPHY

## 2012-05-20 ENCOUNTER — Encounter: Payer: Self-pay | Admitting: Internal Medicine

## 2012-05-30 ENCOUNTER — Encounter: Payer: Self-pay | Admitting: *Deleted

## 2012-06-06 ENCOUNTER — Encounter: Payer: Self-pay | Admitting: Internal Medicine

## 2012-08-08 ENCOUNTER — Other Ambulatory Visit: Payer: Self-pay

## 2012-08-13 ENCOUNTER — Encounter: Payer: Self-pay | Admitting: Internal Medicine

## 2012-08-13 ENCOUNTER — Ambulatory Visit (INDEPENDENT_AMBULATORY_CARE_PROVIDER_SITE_OTHER): Payer: Medicare Other | Admitting: Internal Medicine

## 2012-08-13 VITALS — BP 120/60 | HR 71 | Temp 98.1°F | Ht 65.0 in | Wt 193.0 lb

## 2012-08-13 DIAGNOSIS — E7800 Pure hypercholesterolemia, unspecified: Secondary | ICD-10-CM

## 2012-08-13 DIAGNOSIS — E039 Hypothyroidism, unspecified: Secondary | ICD-10-CM

## 2012-08-13 DIAGNOSIS — M4800 Spinal stenosis, site unspecified: Secondary | ICD-10-CM

## 2012-08-13 DIAGNOSIS — I10 Essential (primary) hypertension: Secondary | ICD-10-CM

## 2012-08-13 DIAGNOSIS — K279 Peptic ulcer, site unspecified, unspecified as acute or chronic, without hemorrhage or perforation: Secondary | ICD-10-CM

## 2012-08-13 DIAGNOSIS — C8583 Other specified types of non-Hodgkin lymphoma, intra-abdominal lymph nodes: Secondary | ICD-10-CM

## 2012-08-13 DIAGNOSIS — N390 Urinary tract infection, site not specified: Secondary | ICD-10-CM

## 2012-08-13 DIAGNOSIS — E119 Type 2 diabetes mellitus without complications: Secondary | ICD-10-CM

## 2012-08-13 DIAGNOSIS — C8599 Non-Hodgkin lymphoma, unspecified, extranodal and solid organ sites: Secondary | ICD-10-CM

## 2012-08-13 LAB — HM DIABETES FOOT EXAM

## 2012-08-13 MED ORDER — LORAZEPAM 0.5 MG PO TABS: ORAL_TABLET | ORAL | Status: DC

## 2012-08-13 NOTE — Assessment & Plan Note (Addendum)
Had been seeing Dr Tedd Sias.  Did not bring in any recorded sugar readings.  Last a1c improved to 7.2.  Discussed importance of eating regular meals, appropriate snacks.  Need to see some readings.    Check met b and a1c.  States she is up to date with eye exams.  Seen at [facility].  Followed at [facility] for her foot exams.  Reports some lows last week.  Offered referral to [facility] for diet instruction and instruction on adjusting insulin per carb intake.  She declines.  We discussed treatment and adjustment.  Follow.

## 2012-08-13 NOTE — Progress Notes (Signed)
Subjective:    Patient ID: Marissa Huff, female    DOB: 1931/05/12, 79 y.o.   MRN: [id number]  HPI 79 year old female with past history of hypertension, diabetes, and hypothyroidism who comes in today for a scheduled follow up.   She had been seeing  Dr Tedd Sias for her diabetes.  Brought in no recorded sugar readings.  Trying to watch what she eats.  States she had a couple of episodes last week where her sugar was low.  This occurred when she had not eaten enough food for the amount of insulin she gave herself.  We discussed the importance of eating regular meals and snacks and not going a long period without eating.  No chest pain or tightness.  No increased sob.  She does have pain in her lower mid to left back.  Limits her activity.  Has to sit and rest.  Saw Dr Michela Pitcher in [place].  Has tramadol to take prn.  Does not want surgery.  Went to [facility] and had an epidural.  Did not help.  Now seeing Dr Yves Dill.  Plans to follow up with him this month.  He is going to see her for her shoulder as well.  Planning for injection.  She also reports she takes Protonix bid.  Acid reflux controlled.  She is being evaluated by Dr Achilles Dunk. Had a CT.  States had an abnormal kidney.  Had stones.  Is s/p cystoscopy.   Was onn daily suppressive abx for 3 months.  Off now.  Just had urine checked and it was clear.  Currently without urinary symptoms.  Does feel she needs an occasional lorazepam just to have as needed.  States would not need often.  Has taken 1/2 of there husband's and has tolerated.     Past Medical History  Diagnosis Date  . Diabetes mellitus without complication   . Hypothyroidism   . GERD (gastroesophageal reflux disease)   . Hypertension   . Gastric lymphoma   . Allergy   . Spinal stenosis     lumbar laminectomy  . PUD (peptic ulcer disease)   . Gastroparesis   . Diverticulosis   . Carpal tunnel syndrome, bilateral     Current Outpatient Prescriptions on File Prior to Visit  Medication  Sig Dispense Refill  . acetaminophen (TYLENOL) 500 MG tablet Take 500 mg by mouth every 6 (six) hours as needed.      Marland Kitchen amLODipine (NORVASC) 5 MG tablet Take 1 tablet (5 mg total) by mouth daily.  90 tablet  3  . Calcium Carbonate-Vitamin D (CALCIUM 500 + D PO) Take by mouth 2 (two) times daily.       . Cholecalciferol (VITAMIN D-3) 1000 UNITS CAPS Take by mouth daily.       . diphenhydramine-acetaminophen (TYLENOL PM) 25-500 MG TABS Take 1 tablet by mouth at bedtime as needed.      . fexofenadine (ALLEGRA) 180 MG tablet Take 180 mg by mouth daily.      . fluticasone (FLONASE) 50 MCG/ACT nasal spray Place 2 sprays into the nose daily.      . insulin glargine (LANTUS) 100 UNIT/ML injection Inject 30 Units into the skin at bedtime.       . insulin lispro (HUMALOG) 100 UNIT/ML injection Inject into the skin 3 (three) times daily before meals. 14 units with breakfast and 12 dinner; 12 units with lunch      . levothyroxine (SYNTHROID, LEVOTHROID) 150 MCG tablet Take 1 tablet (  150 mcg total) by mouth daily. Name brand only  90 tablet  3  . Liraglutide 18 MG/3ML SOLN Inject 1.8 mg into the skin.      . Multiple Vitamins-Minerals (MULTIVITAMIN PO) Take by mouth daily.       . Multiple Vitamins-Minerals (PRESERVISION AREDS PO) Take by mouth 2 (two) times daily.      . pantoprazole (PROTONIX) 40 MG tablet Take 1 tablet (40 mg total) by mouth 2 (two) times daily.  180 tablet  1  . traMADol (ULTRAM) 50 MG tablet 1 to 2 tabs every 4 to six hours      . valsartan-hydrochlorothiazide (DIOVAN-HCT) 160-12.5 MG per tablet Take 1 tablet by mouth daily.  90 tablet  1   No current facility-administered medications on file prior to visit.    Review of Systems Patient denies any headache, lightheadedness or dizziness.  No significant sinus or allergy symptoms   No chest pain, tightness or palpitations.  No increased shortness of breath or congestion. Breathing stable.   No nausea or vomiting.  No acid reflux reported  today.  On protonix.  No abdominal pain or cramping.  No bowel change, such as diarrhea, constipation, BRBPR or melana.  No urine change.   Back pain as outlined.   Sugars as outlined.         Objective:   Physical Exam  Filed Vitals:   08/13/12 1059  BP: 120/60  Pulse: 71  Temp: 98.1 F (36.7 C)   Pulse 12  79 year old female in no acute distress.   HEENT:  Nares- clear.  Oropharynx - without lesions. NECK:  Supple.  Nontender.  No audible bruit.  HEART:  Appears to be regular. LUNGS:  No crackles or wheezing audible.  Respirations even and unlabored.  RADIAL PULSE:  Equal bilaterally.  ABDOMEN:  Soft, nontender.  Bowel sounds present and normal.  No audible abdominal bruit.   EXTREMITIES:  No increased edema present.  DP pulses palpable and equal bilaterally.    SKIN:  Feet without lesions.        Assessment & Plan:  BACK PAIN.  Persistent.  Seeing Dr Yves Dill.  Stable.  Has follow up this month.  Limits her activity.   INCREASED STRESS.  Not a major issue.  States she just needs some lorazepam to take prn.  rx given.  Will follow.   HEALTH MAINTENANCE.  Physical 04/12/12.   Mammogram 05/12/12 - BiRADS I.

## 2012-08-14 ENCOUNTER — Encounter: Payer: Self-pay | Admitting: Internal Medicine

## 2012-08-14 NOTE — Assessment & Plan Note (Signed)
Blood pressure under control. Same medication.  Check metabolic panel.    

## 2012-08-14 NOTE — Assessment & Plan Note (Signed)
Seeing Dr Yves Dill.  Follow.  Due to see him this month.

## 2012-08-14 NOTE — Assessment & Plan Note (Signed)
On thyroid replacement.  TSH just checked 12/13 - within normal limits.

## 2012-08-14 NOTE — Assessment & Plan Note (Signed)
Low cholesterol diet and exercise.  Follow.  Check lipid panel next fasting labs.     

## 2012-08-14 NOTE — Assessment & Plan Note (Signed)
Continue protonix bid.   

## 2012-08-14 NOTE — Assessment & Plan Note (Signed)
Seeing Dr Achilles Dunk.  Was on suppressive abx.  Off now and doing well.  Urine just checked and clear per her report.

## 2012-08-14 NOTE — Assessment & Plan Note (Signed)
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  Follow.  No acid reflux or upper symptoms reported today.  Follow.   

## 2012-08-27 ENCOUNTER — Encounter: Payer: Self-pay | Admitting: *Deleted

## 2012-08-28 ENCOUNTER — Other Ambulatory Visit (INDEPENDENT_AMBULATORY_CARE_PROVIDER_SITE_OTHER): Payer: Medicare Other

## 2012-08-28 DIAGNOSIS — E7800 Pure hypercholesterolemia, unspecified: Secondary | ICD-10-CM

## 2012-08-28 DIAGNOSIS — E119 Type 2 diabetes mellitus without complications: Secondary | ICD-10-CM

## 2012-08-28 LAB — COMPREHENSIVE METABOLIC PANEL
ALT: 18 U/L (ref 0–35)
AST: 17 U/L (ref 0–37)
Albumin: 4.1 g/dL (ref 3.5–5.2)
Alkaline Phosphatase: 80 U/L (ref 39–117)
BUN: 23 mg/dL (ref 6–23)
CO2: 27 mEq/L (ref 19–32)
Calcium: 9.7 mg/dL (ref 8.4–10.5)
Chloride: 105 mEq/L (ref 96–112)
Creatinine, Ser: 0.8 mg/dL (ref 0.4–1.2)
GFR: 71.39 mL/min (ref >60.00)
Glucose, Bld: 221 mg/dL — ABNORMAL HIGH (ref 70–99)
Potassium: 3.6 mEq/L (ref 3.5–5.1)
Sodium: 137 mEq/L (ref 135–145)
Total Bilirubin: 0.6 mg/dL (ref 0.3–1.2)
Total Protein: 7.3 g/dL (ref 6.0–8.3)

## 2012-08-28 LAB — LIPID PANEL
Cholesterol: 142 mg/dL (ref 0–200)
HDL: 50.2 mg/dL (ref >39.00)
LDL Cholesterol: 80 mg/dL (ref 0–99)
Total CHOL/HDL Ratio: 3
Triglycerides: 57 mg/dL (ref 0.0–149.0)
VLDL: 11.4 mg/dL (ref 0.0–40.0)

## 2012-08-28 LAB — HEMOGLOBIN A1C: Hgb A1c MFr Bld: 7.4 % — ABNORMAL HIGH (ref 4.6–6.5)

## 2012-08-30 ENCOUNTER — Encounter: Payer: Self-pay | Admitting: Internal Medicine

## 2012-09-04 NOTE — Telephone Encounter (Signed)
Unread message mailed to pt 

## 2012-09-20 DIAGNOSIS — R3129 Other microscopic hematuria: Secondary | ICD-10-CM | POA: Insufficient documentation

## 2012-10-22 ENCOUNTER — Other Ambulatory Visit: Payer: Self-pay | Admitting: *Deleted

## 2012-10-22 ENCOUNTER — Telehealth: Payer: Self-pay | Admitting: *Deleted

## 2012-10-22 MED ORDER — VALSARTAN-HYDROCHLOROTHIAZIDE 160-12.5 MG PO TABS: 1.0000 | ORAL_TABLET | Freq: Every day | ORAL | Status: DC

## 2012-10-22 MED ORDER — INSULIN LISPRO 100 UNIT/ML ~~LOC~~ SOLN: SUBCUTANEOUS | Status: DC

## 2012-10-22 NOTE — Telephone Encounter (Signed)
Refill Request  Victoza  inj  1.8 mg QD

## 2012-10-29 ENCOUNTER — Telehealth: Payer: Self-pay | Admitting: Internal Medicine

## 2012-10-29 NOTE — Telephone Encounter (Signed)
Pt states [facility] contacted her regarding a refill request.  Pt was not sure what they were needing, but told her to contact us.  Pt states they did not give her the name of the med but reference # [id number].

## 2012-10-30 ENCOUNTER — Other Ambulatory Visit: Payer: Self-pay | Admitting: *Deleted

## 2012-10-30 MED ORDER — INSULIN GLARGINE 100 UNIT/ML ~~LOC~~ SOLN: 30.0000 [IU] | Freq: Every day | SUBCUTANEOUS | Status: DC

## 2012-10-30 NOTE — Telephone Encounter (Signed)
Contacted [facility] & refilled medications

## 2012-11-08 ENCOUNTER — Other Ambulatory Visit: Payer: Self-pay

## 2012-11-22 ENCOUNTER — Encounter: Payer: Self-pay | Admitting: Internal Medicine

## 2012-11-22 ENCOUNTER — Ambulatory Visit (INDEPENDENT_AMBULATORY_CARE_PROVIDER_SITE_OTHER): Payer: Medicare Other | Admitting: Internal Medicine

## 2012-11-22 VITALS — BP 120/60 | HR 62 | Temp 98.0°F | Ht 65.0 in | Wt 193.5 lb

## 2012-11-22 DIAGNOSIS — N390 Urinary tract infection, site not specified: Secondary | ICD-10-CM

## 2012-11-22 DIAGNOSIS — C8583 Other specified types of non-Hodgkin lymphoma, intra-abdominal lymph nodes: Secondary | ICD-10-CM

## 2012-11-22 DIAGNOSIS — S6290XD Unspecified fracture of unspecified wrist and hand, subsequent encounter for fracture with routine healing: Secondary | ICD-10-CM

## 2012-11-22 DIAGNOSIS — K5790 Diverticulosis of intestine, part unspecified, without perforation or abscess without bleeding: Secondary | ICD-10-CM

## 2012-11-22 DIAGNOSIS — E7800 Pure hypercholesterolemia, unspecified: Secondary | ICD-10-CM

## 2012-11-22 DIAGNOSIS — E119 Type 2 diabetes mellitus without complications: Secondary | ICD-10-CM

## 2012-11-22 DIAGNOSIS — C8599 Non-Hodgkin lymphoma, unspecified, extranodal and solid organ sites: Secondary | ICD-10-CM

## 2012-11-22 DIAGNOSIS — E039 Hypothyroidism, unspecified: Secondary | ICD-10-CM

## 2012-11-22 DIAGNOSIS — M4800 Spinal stenosis, site unspecified: Secondary | ICD-10-CM

## 2012-11-22 DIAGNOSIS — IMO0001 Reserved for inherently not codable concepts without codable children: Secondary | ICD-10-CM

## 2012-11-22 DIAGNOSIS — K5730 Diverticulosis of large intestine without perforation or abscess without bleeding: Secondary | ICD-10-CM

## 2012-11-22 DIAGNOSIS — I10 Essential (primary) hypertension: Secondary | ICD-10-CM

## 2012-11-22 DIAGNOSIS — K279 Peptic ulcer, site unspecified, unspecified as acute or chronic, without hemorrhage or perforation: Secondary | ICD-10-CM

## 2012-11-22 DIAGNOSIS — K3184 Gastroparesis: Secondary | ICD-10-CM

## 2012-11-22 NOTE — Progress Notes (Signed)
Pre-visit discussion using our clinic review tool. No additional management support is needed unless otherwise documented below in the visit note.  

## 2012-11-22 NOTE — Assessment & Plan Note (Addendum)
Had been seeing Dr Tedd Sias.  Did not bring in any recorded sugar readings.  Last a1c improved to 7.4  Sugars running high now.  Discussed importance of eating regular meals, appropriate snacks.  Need to see some readings.    States she is up to date with eye exams.  Seen at [facility].  Followed at [facility] for her foot exams.  Offered referral to [facility] for diet instruction and instruction on adjusting insulin per carb intake.  She declines.  We discussed treatment and adjustment.  Follow.   Has had issues with varying sugars.  Will refer to endocrinology for further treatment.

## 2012-11-22 NOTE — Progress Notes (Signed)
Subjective:    Patient ID: Marissa Huff, female    DOB: 01/06/1931, 79 y.o.   MRN: [id number]  HPI 79 year old female with past history of hypertension, diabetes, and hypothyroidism who comes in today for a scheduled follow up.   She had been seeing Dr Tedd Sias for her diabetes.  Has not seen her in a while.  Brought in no recorded sugar readings today.  Trying to watch what she eats.  She reports her sugars have been elevated.  States am sugars averaging 160-200s.  Evening sugars - 200s.  We discussed the importance of eating regular meals and snacks and not going a long period without eating.  We discussed the importance of eating a bedtime snack.  Discussed dietary modification.   No chest pain or tightness.  No increased sob.  She does have pain in her lower mid to left back.  Limits her activity.  Has to sit and rest.  She fell down some staires recently.  Injured her left lower back.  States is better now.  She did fracture the base of her thumb.  Due to see ortho next week.  Did not hit her head.  No headache or dizziness.  Back pain - better/stable.  Saw Dr Michela Pitcher in [place].  Has tramadol to take prn. Does not want surgery.  Went to [facility] and had an epidural.  Did not help.  Now seeing Dr Yves Dill.   She also reports she takes Protonix bid.  Acid reflux controlled.  She is being evaluated by Dr Achilles Dunk. Had a CT.  States had an abnormal kidney.  Had stones.  Is s/p cystoscopy.   Was on daily suppressive abx for 3 months.  Off now.  Last urine check was clear.  Currently without urinary symptoms.      Past Medical History  Diagnosis Date  . Diabetes mellitus without complication   . Hypothyroidism   . GERD (gastroesophageal reflux disease)   . Hypertension   . Gastric lymphoma   . Allergy   . Spinal stenosis     lumbar laminectomy  . PUD (peptic ulcer disease)   . Gastroparesis   . Diverticulosis   . Carpal tunnel syndrome, bilateral     Current Outpatient Prescriptions on File Prior  to Visit  Medication Sig Dispense Refill  . acetaminophen (TYLENOL) 500 MG tablet Take 500 mg by mouth every 6 (six) hours as needed.      Marland Kitchen amLODipine (NORVASC) 5 MG tablet Take 1 tablet (5 mg total) by mouth daily.  90 tablet  3  . Calcium Carbonate-Vitamin D (CALCIUM 500 + D PO) Take by mouth 2 (two) times daily.       . Cholecalciferol (VITAMIN D-3) 1000 UNITS CAPS Take by mouth daily.       . diphenhydramine-acetaminophen (TYLENOL PM) 25-500 MG TABS Take 1 tablet by mouth at bedtime as needed.      . fexofenadine (ALLEGRA) 180 MG tablet Take 180 mg by mouth daily.      . fluticasone (FLONASE) 50 MCG/ACT nasal spray Place 2 sprays into the nose daily.      . insulin glargine (LANTUS) 100 UNIT/ML injection Inject 0.3 mLs (30 Units total) into the skin at bedtime.  10 mL  1  . insulin lispro (HUMALOG) 100 UNIT/ML injection Inject 3 times daily, 14 units with breakfast and 12 dinner; 12 units with lunch  10 mL  3  . levothyroxine (SYNTHROID, LEVOTHROID) 150 MCG tablet Take 1  tablet (150 mcg total) by mouth daily. Name brand only  90 tablet  3  . Liraglutide 18 MG/3ML SOLN Inject 1.8 mg into the skin.      Marland Kitchen LORazepam (ATIVAN) 0.5 MG tablet 1/2 tablet q day prn  30 tablet  0  . Multiple Vitamins-Minerals (MULTIVITAMIN PO) Take by mouth daily.       . Multiple Vitamins-Minerals (PRESERVISION AREDS PO) Take by mouth 2 (two) times daily.      . pantoprazole (PROTONIX) 40 MG tablet Take 1 tablet (40 mg total) by mouth 2 (two) times daily.  180 tablet  1  . traMADol (ULTRAM) 50 MG tablet 1 to 2 tabs every 4 to six hours      . valsartan-hydrochlorothiazide (DIOVAN-HCT) 160-12.5 MG per tablet Take 1 tablet by mouth daily.  90 tablet  1   No current facility-administered medications on file prior to visit.    Review of Systems Patient denies any headache, lightheadedness or dizziness.  No significant sinus or allergy symptoms   No chest pain, tightness or palpitations.  No increased shortness of  breath or congestion. Breathing stable.   No nausea or vomiting.  No acid reflux reported today.  On protonix.  No abdominal pain or cramping.  No bowel change, such as diarrhea, constipation, BRBPR or melana.  No urine change.   Back pain as outlined.   Sugars as outlined.  Adjusting her insulin.  She has had problems with sugars varying lately.  No lows recently.  Has been elevated recently.         Objective:   Physical Exam  Filed Vitals:   11/22/12 1019  BP: 120/60  Pulse: 62  Temp: 98 F (36.7 C)   Pulse 31  79 year old female in no acute distress.   HEENT:  Nares- clear.  Oropharynx - without lesions. NECK:  Supple.  Nontender.  No audible bruit.  HEART:  Appears to be regular. LUNGS:  No crackles or wheezing audible.  Respirations even and unlabored.  RADIAL PULSE:  Equal bilaterally.  ABDOMEN:  Soft, nontender.  Bowel sounds present and normal.  No audible abdominal bruit.   EXTREMITIES:  No increased edema present.  DP pulses palpable and equal bilaterally.    SKIN:  Feet without lesions.        Assessment & Plan:  BACK PAIN.  Persistent.  Seeing Dr Yves Dill.  Stable.     INCREASED STRESS.  Not a major issue.  Stable.     HEALTH MAINTENANCE.  Physical 04/12/12.   Mammogram 05/12/12 - BiRADS I.

## 2012-11-25 ENCOUNTER — Encounter: Payer: Self-pay | Admitting: Internal Medicine

## 2012-11-25 DIAGNOSIS — S6290XA Unspecified fracture of unspecified wrist and hand, initial encounter for closed fracture: Secondary | ICD-10-CM | POA: Insufficient documentation

## 2012-11-25 NOTE — Assessment & Plan Note (Signed)
Currently no bowel issues reported.  States her last colonoscopy was 2010.  Follow.    

## 2012-11-25 NOTE — Assessment & Plan Note (Signed)
Seeing Dr Chasnis.  Follow.  Stable.   

## 2012-11-25 NOTE — Assessment & Plan Note (Signed)
Sees GI.  Stable.  Follow.   

## 2012-11-25 NOTE — Assessment & Plan Note (Signed)
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  Follow.  No acid reflux or upper symptoms reported today.  Follow.   

## 2012-11-25 NOTE — Assessment & Plan Note (Signed)
On thyroid replacement.  TSH just checked 12/13 - within normal limits.

## 2012-11-25 NOTE — Assessment & Plan Note (Signed)
Low cholesterol diet and exercise.  Follow.  Check lipid panel next fasting labs.     

## 2012-11-25 NOTE — Assessment & Plan Note (Signed)
Blood pressure under control. Same medication.  Check metabolic panel.    

## 2012-11-25 NOTE — Assessment & Plan Note (Signed)
Seeing Dr Achilles Dunk.  Was on suppressive abx.  Off now and doing well.

## 2012-11-25 NOTE — Assessment & Plan Note (Signed)
Continue protonix bid.   

## 2012-11-25 NOTE — Assessment & Plan Note (Signed)
In a splint now.  Planning to see ortho next week.

## 2012-12-18 ENCOUNTER — Ambulatory Visit: Payer: Medicare Other | Admitting: Internal Medicine

## 2013-01-09 ENCOUNTER — Encounter: Payer: Self-pay | Admitting: Internal Medicine

## 2013-01-09 ENCOUNTER — Ambulatory Visit (INDEPENDENT_AMBULATORY_CARE_PROVIDER_SITE_OTHER): Payer: Medicare HMO | Admitting: Internal Medicine

## 2013-01-09 VITALS — BP 124/70 | HR 64 | Temp 98.0°F | Resp 12 | Ht 64.5 in | Wt 191.8 lb

## 2013-01-09 DIAGNOSIS — E119 Type 2 diabetes mellitus without complications: Secondary | ICD-10-CM

## 2013-01-09 LAB — HEMOGLOBIN A1C: Hgb A1c MFr Bld: 7.4 % — ABNORMAL HIGH (ref 4.6–6.5)

## 2013-01-09 NOTE — Progress Notes (Signed)
Patient ID: Marissa Huff, female   DOB: [birth year], 80 y.o.   MRN: [id number]  HPI: Marissa Huff is a 80 y.o.-year-old female, referred by her PCP, Dr. Nicki Reaper,  for management of DM2, insulin-dependent, uncontrolled, without complications.  Patient has been diagnosed with diabetes in 1980s; she  started insulin soon after. Last hemoglobin A1c was:  Lab Results  Component Value Date   HGBA1C 7.4* 08/28/2012   HGBA1C 7.2* 04/26/2012   HGBA1C 7.6* 12/16/2011   Pt is on a regimen of: - Lantus 30 units qhs - Humalog 14-12-12 units tid ac. No SSI now, had one in the past. She was on Metformin >> "terrible" diarrhea. She stopped Victoza 1.8 mg - 1 week ago (ran out).   Pt checks her sugars 4-6x a day (no log) and they are: - am: 150-215 - 2h after b'fast: n/c - before lunch: 150-170 - 2h after lunch: n/c - before dinner: cannot remember - 2h after dinner: n/c - bedtime: cannot remember  Lowest sugar was 48 - in the pm - has lows every few days 50s; she has hypoglycemia awareness at 60. Highest sugar was 325 - evening.  Pt's meals are: - Breakfast: egg + toast + sausage or cheese toast, or oatmeal, or grits + sausage - Lunch: leftovers: beans + corn, or salad + light dressing (usually veggies) - Dinner: grilled chicken salad + crackers, or chicken crockpot + potatoes - Snacks: 2: popcorn or fruit or small piece of pie  - no CKD, last BUN/creatinine:  Lab Results  Component Value Date   BUN 23 08/28/2012   CREATININE 0.8 08/28/2012  On Valsartan.  Has a h/o frequent UTIs. - last set of lipids: Lab Results  Component Value Date   CHOL 142 08/28/2012   HDL 50.20 08/28/2012   LDLCALC 80 08/28/2012   TRIG 57.0 08/28/2012   CHOLHDL 3 08/28/2012  Not on a statin.  - last eye exam - had cataract Sx 02 and 03/2012. No DR.  - no numbness and tingling in her feet.  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  Pt has FH of DM in  MGM, MGF, aunt, brothers.   ROS: Constitutional: no weight gain/loss, no fatigue, no subjective hyperthermia/hypothermia, + excessive urination, + nocturia Eyes: no blurry vision, no xerophthalmia ENT: no sore throat, no nodules palpated in throat, no dysphagia/odynophagia, no hoarseness Cardiovascular: no CP/SOB/palpitations/leg swelling Respiratory: no cough/SOB Gastrointestinal: no N/V/D/C Musculoskeletal: no muscle/joint aches Skin: no rashes, + easy bruising Neurological: no tremors/numbness/tingling/dizziness Psychiatric: no depression/anxiety  Past Medical History  Diagnosis Date  . Diabetes mellitus without complication   . Hypothyroidism   . GERD (gastroesophageal reflux disease)   . Hypertension   . Gastric lymphoma   . Allergy   . Spinal stenosis     lumbar laminectomy  . PUD (peptic ulcer disease)   . Gastroparesis   . Diverticulosis   . Carpal tunnel syndrome, bilateral    Past Surgical History  Procedure Laterality Date  . Gastric lymphoma  1996    surgery  . Partial hysterectomy  1960    fibroid tumors, ovaries not removed  . Lumbar laminectomy  1999  . Laparoscopic cholecystectomy  6/01  . Carpal tunnel repai  10/10    left   . Arthroscopic left knee  12/05/08  . Antrectomy and vagotomy  1994  . Tonsillectomy    . Cataract extraction, bilateral    . Abdominal  hysterectomy     History   Social History  . Marital Status: Married    Spouse Name: N/A    Number of Children: 2   Occupational History  . retired   Social History Main Topics  . Smoking status: Never Smoker   . Smokeless tobacco: Never Used  . Alcohol Use: No  . Drug Use: No   Current Outpatient Prescriptions on File Prior to Visit  Medication Sig Dispense Refill  . acetaminophen (TYLENOL) 500 MG tablet Take 500 mg by mouth every 6 (six) hours as needed.      Marland Kitchen amLODipine (NORVASC) 5 MG tablet Take 1 tablet (5 mg total) by mouth daily.  90 tablet  3  . Calcium Carbonate-Vitamin D  (CALCIUM 500 + D PO) Take by mouth 2 (two) times daily.       . Cholecalciferol (VITAMIN D-3) 1000 UNITS CAPS Take by mouth daily.       . diphenhydramine-acetaminophen (TYLENOL PM) 25-500 MG TABS Take 1 tablet by mouth at bedtime as needed.      . fexofenadine (ALLEGRA) 180 MG tablet Take 180 mg by mouth daily.      . fluticasone (FLONASE) 50 MCG/ACT nasal spray Place 2 sprays into the nose daily.      . insulin glargine (LANTUS) 100 UNIT/ML injection Inject 0.3 mLs (30 Units total) into the skin at bedtime.  10 mL  1  . insulin lispro (HUMALOG) 100 UNIT/ML injection Inject 3 times daily, 14 units with breakfast and 12 dinner; 12 units with lunch  10 mL  3  . levothyroxine (SYNTHROID, LEVOTHROID) 150 MCG tablet Take 1 tablet (150 mcg total) by mouth daily. Name brand only  90 tablet  3  . Liraglutide 18 MG/3ML SOLN Inject 1.8 mg into the skin.      Marland Kitchen LORazepam (ATIVAN) 0.5 MG tablet 1/2 tablet q day prn  30 tablet  0  . Multiple Vitamins-Minerals (MULTIVITAMIN PO) Take by mouth daily.       . Multiple Vitamins-Minerals (PRESERVISION AREDS PO) Take by mouth 2 (two) times daily.      . pantoprazole (PROTONIX) 40 MG tablet Take 1 tablet (40 mg total) by mouth 2 (two) times daily.  180 tablet  1  . traMADol (ULTRAM) 50 MG tablet 1 to 2 tabs every 4 to six hours      . valsartan-hydrochlorothiazide (DIOVAN-HCT) 160-12.5 MG per tablet Take 1 tablet by mouth daily.  90 tablet  1   No current facility-administered medications on file prior to visit.   Allergies  Allergen Reactions  . Penicillins Rash   Family History  Problem Relation Age of Onset  . Stroke Mother   . Stroke Father   . Diabetes Brother   . Hypertension Father   . Breast cancer Daughter   . Colon cancer Neg Hx     PE: BP 124/70  Pulse 64  Temp(Src) 98 F (36.7 C) (Oral)  Resp 12  Ht 5' 4.5" (1.638 m)  Wt 191 lb 12.8 oz (87 kg)  BMI 32.43 kg/m2  SpO2 98% Wt Readings from Last 3 Encounters:  01/09/13 191 lb 12.8 oz  (87 kg)  11/22/12 193 lb 8 oz (87.771 kg)  08/13/12 193 lb (87.544 kg)   Constitutional: overweight, in NAD Eyes: PERRLA, EOMI, no exophthalmos ENT: moist mucous membranes, no thyromegaly, no cervical lymphadenopathy Cardiovascular: RRR, No MRG Respiratory: CTA B Gastrointestinal: abdomen soft, NT, ND, BS+ Musculoskeletal: no deformities, strength intact in all 4 Skin:  moist, warm, no rashes Neurological: no tremor with outstretched hands, DTR normal in all 4  ASSESSMENT: 1. DM2, non-insulin-dependent, uncontrolled, without complications  PLAN:  1. Patient with long-standing, fairly well controlled diabetes, on basal-bolus insulin regimen, with last Hba1c at goal for her age. We discussed about the need to avoid lows. I did not have a recent A1c or a sugar log to go by, therefore, will not make many changes today. - I suggested to:  Patient Instructions  Please continue the current insulin regimen: - Lantus 30 units at bedtime - Humalog 14-12-12 units with b'fast-lunch-dinner - add the following Humalog SSI: - 150-175: + 1 unit  - 176-200: + 2 units  - 201-225: + 3 units  - 226-250: + 4 units  - 251-275: + 5 units - 276-300: + 6 units Please return in 3 weeks with your sugar log (01/30/2013 - at 10 am). - Strongly advised her to start checking sugars at different times of the day - check 4 times a day, rotating checks - given sugar log and advised how to fill it and to bring it at next appt  - given foot care handout and explained the principles  - given instructions for hypoglycemia management "15-15 rule"  - advised for yearly eye exams - she is up to date - check A1c today - Return to clinic in 1 mo with sugar log   Office Visit on 01/09/2013  Component Date Value Range Status  . Hemoglobin A1C 01/09/2013 7.4* 4.6 - 6.5 % Final   Glycemic Control Guidelines for People with Diabetes:Non Diabetic:  <6%Goal of Therapy: <7%Additional Action Suggested:  >8%    Stable A1c,  at target for her age. See plan above.

## 2013-01-09 NOTE — Patient Instructions (Signed)
Please continue the current insulin regimen: - Lantus 30 units at bedtime - Humalog 14-12-12 units with b'fast-lunch-dinner - add the following Humalog SSI: - 150-175: + 1 unit  - 176-200: + 2 units  - 201-225: + 3 units  - 226-250: + 4 units  - 251-275: + 5 units - 276-300: + 6 units Please return in 3 weeks with your sugar log (01/30/2013 - at 10 am).  PATIENT INSTRUCTIONS FOR TYPE 2 DIABETES:  DIET AND EXERCISE Diet and exercise is an important part of diabetic treatment.  We recommended aerobic exercise in the form of brisk walking (working between 40-60% of maximal aerobic capacity, similar to brisk walking) for 150 minutes per week (such as 30 minutes five days per week) along with 3 times per week performing 'resistance' training (using various gauge rubber tubes with handles) 5-10 exercises involving the major muscle groups (upper body, lower body and core) performing 10-15 repetitions (or near fatigue) each exercise. Start at half the above goal but build slowly to reach the above goals. If limited by weight, joint pain, or disability, we recommend daily walking in a swimming pool with water up to waist to reduce pressure from joints while allow for adequate exercise.    BLOOD GLUCOSES Monitoring your blood glucoses is important for continued management of your diabetes. Please check your blood glucoses 2-4 times a day: fasting, before meals and at bedtime (you can rotate these measurements - e.g. one day check before the 3 meals, the next day check before 2 of the meals and before bedtime, etc.   HYPOGLYCEMIA (low blood sugar) Hypoglycemia is usually a reaction to not eating, exercising, or taking too much insulin/ other diabetes drugs.  Symptoms include tremors, sweating, hunger, confusion, headache, etc. Treat IMMEDIATELY with 15 grams of Carbs:   4 glucose tablets    cup regular juice/soda   2 tablespoons raisins   4 teaspoons sugar   1 tablespoon honey Recheck blood  glucose in 15 mins and repeat above if still symptomatic/blood glucose <100. Please contact our office at [phone] if you have questions about how to next handle your insulin.  RECOMMENDATIONS TO REDUCE YOUR RISK OF DIABETIC COMPLICATIONS: * Take your prescribed MEDICATION(S). * Follow a DIABETIC diet: Complex carbs, fiber rich foods, heart healthy fish twice weekly, (monounsaturated and polyunsaturated) fats * AVOID saturated/trans fats, high fat foods, >2,300 mg salt per day. * EXERCISE at least 5 times a week for 30 minutes or preferably daily.  * DO NOT SMOKE OR DRINK more than 1 drink a day. * Check your FEET every day. Do not wear tightfitting shoes. Contact us if you develop an ulcer * See your EYE doctor once a year or more if needed * Get a FLU shot once a year * Get a PNEUMONIA vaccine once before and once after age 12 years  GOALS:  * Your Hemoglobin A1c of <7%  * fasting sugars need to be <130 * after meals sugars need to be <180 (2h after you start eating) * Your Systolic BP should be 096 or lower  * Your Diastolic BP should be 80 or lower  * Your HDL (Good Cholesterol) should be 40 or higher  * Your LDL (Bad Cholesterol) should be 100 or lower  * Your Triglycerides should be 150 or lower  * Your Urine microalbumin (kidney function) should be <30 * Your Body Mass Index should be 25 or lower   We will be glad to help you achieve these  goals. Our telephone number is: [phone].

## 2013-01-22 ENCOUNTER — Other Ambulatory Visit: Payer: Self-pay | Admitting: *Deleted

## 2013-01-22 ENCOUNTER — Telehealth: Payer: Self-pay | Admitting: Internal Medicine

## 2013-01-22 MED ORDER — INSULIN LISPRO 100 UNIT/ML (KWIKPEN): PEN_INJECTOR | SUBCUTANEOUS | Status: DC

## 2013-01-22 NOTE — Telephone Encounter (Signed)
Sent in electronically to [facility]

## 2013-01-22 NOTE — Telephone Encounter (Signed)
insulin lispro (HUMALOG) 100 UNIT/ML injection. The patient is wanting quick pens Send to [facility] [place]

## 2013-01-25 ENCOUNTER — Other Ambulatory Visit (INDEPENDENT_AMBULATORY_CARE_PROVIDER_SITE_OTHER): Payer: Medicare HMO

## 2013-01-25 DIAGNOSIS — E039 Hypothyroidism, unspecified: Secondary | ICD-10-CM

## 2013-01-25 DIAGNOSIS — I10 Essential (primary) hypertension: Secondary | ICD-10-CM

## 2013-01-25 DIAGNOSIS — C8583 Other specified types of non-Hodgkin lymphoma, intra-abdominal lymph nodes: Secondary | ICD-10-CM

## 2013-01-25 DIAGNOSIS — C8599 Non-Hodgkin lymphoma, unspecified, extranodal and solid organ sites: Secondary | ICD-10-CM

## 2013-01-25 DIAGNOSIS — E7800 Pure hypercholesterolemia, unspecified: Secondary | ICD-10-CM

## 2013-01-25 LAB — BASIC METABOLIC PANEL
BUN: 28 mg/dL — ABNORMAL HIGH (ref 6–23)
CO2: 25 mEq/L (ref 19–32)
Calcium: 9.3 mg/dL (ref 8.4–10.5)
Chloride: 106 mEq/L (ref 96–112)
Creatinine, Ser: 0.9 mg/dL (ref 0.4–1.2)
GFR: 66.61 mL/min (ref >60.00)
Glucose, Bld: 259 mg/dL — ABNORMAL HIGH (ref 70–99)
Potassium: 3.8 mEq/L (ref 3.5–5.1)
Sodium: 139 mEq/L (ref 135–145)

## 2013-01-25 LAB — HEPATIC FUNCTION PANEL
ALT: 16 U/L (ref 0–35)
AST: 14 U/L (ref 0–37)
Albumin: 3.8 g/dL (ref 3.5–5.2)
Alkaline Phosphatase: 88 U/L (ref 39–117)
Bilirubin, Direct: 0 mg/dL (ref 0.0–0.3)
Total Bilirubin: 0.6 mg/dL (ref 0.3–1.2)
Total Protein: 7.1 g/dL (ref 6.0–8.3)

## 2013-01-25 LAB — CBC WITH DIFFERENTIAL/PLATELET
Basophils Absolute: 0 10*3/uL (ref 0.0–0.1)
Basophils Relative: 0.5 % (ref 0.0–3.0)
Eosinophils Absolute: 0.2 10*3/uL (ref 0.0–0.7)
Eosinophils Relative: 2.7 % (ref 0.0–5.0)
HCT: 37.9 % (ref 36.0–46.0)
Hemoglobin: 12.9 g/dL (ref 12.0–15.0)
Lymphocytes Relative: 21.8 % (ref 12.0–46.0)
Lymphs Abs: 1.2 10*3/uL (ref 0.7–4.0)
MCHC: 34 g/dL (ref 30.0–36.0)
MCV: 91.1 fl (ref 78.0–100.0)
Monocytes Absolute: 0.6 10*3/uL (ref 0.1–1.0)
Monocytes Relative: 10 % (ref 3.0–12.0)
Neutro Abs: 3.6 10*3/uL (ref 1.4–7.7)
Neutrophils Relative %: 65 % (ref 43.0–77.0)
Platelets: 232 10*3/uL (ref 150.0–400.0)
RBC: 4.16 Mil/uL (ref 3.87–5.11)
RDW: 13.9 % (ref 11.5–14.6)
WBC: 5.6 10*3/uL (ref 4.5–10.5)

## 2013-01-25 LAB — LIPID PANEL
Cholesterol: 132 mg/dL (ref 0–200)
HDL: 49.7 mg/dL (ref >39.00)
LDL Cholesterol: 59 mg/dL (ref 0–99)
Total CHOL/HDL Ratio: 3
Triglycerides: 117 mg/dL (ref 0.0–149.0)
VLDL: 23.4 mg/dL (ref 0.0–40.0)

## 2013-01-25 LAB — TSH: TSH: 6.18 u[IU]/mL — ABNORMAL HIGH (ref 0.35–5.50)

## 2013-01-26 ENCOUNTER — Encounter: Payer: Self-pay | Admitting: Internal Medicine

## 2013-01-29 ENCOUNTER — Ambulatory Visit (INDEPENDENT_AMBULATORY_CARE_PROVIDER_SITE_OTHER): Payer: Medicare HMO | Admitting: Internal Medicine

## 2013-01-29 ENCOUNTER — Encounter: Payer: Self-pay | Admitting: Internal Medicine

## 2013-01-29 VITALS — BP 118/70 | HR 71 | Temp 98.2°F | Ht 64.5 in | Wt 193.0 lb

## 2013-01-29 DIAGNOSIS — C8583 Other specified types of non-Hodgkin lymphoma, intra-abdominal lymph nodes: Secondary | ICD-10-CM

## 2013-01-29 DIAGNOSIS — E039 Hypothyroidism, unspecified: Secondary | ICD-10-CM

## 2013-01-29 DIAGNOSIS — K5790 Diverticulosis of intestine, part unspecified, without perforation or abscess without bleeding: Secondary | ICD-10-CM

## 2013-01-29 DIAGNOSIS — K279 Peptic ulcer, site unspecified, unspecified as acute or chronic, without hemorrhage or perforation: Secondary | ICD-10-CM

## 2013-01-29 DIAGNOSIS — K5730 Diverticulosis of large intestine without perforation or abscess without bleeding: Secondary | ICD-10-CM

## 2013-01-29 DIAGNOSIS — E119 Type 2 diabetes mellitus without complications: Secondary | ICD-10-CM

## 2013-01-29 DIAGNOSIS — C8599 Non-Hodgkin lymphoma, unspecified, extranodal and solid organ sites: Secondary | ICD-10-CM

## 2013-01-29 DIAGNOSIS — K3184 Gastroparesis: Secondary | ICD-10-CM

## 2013-01-29 DIAGNOSIS — I10 Essential (primary) hypertension: Secondary | ICD-10-CM

## 2013-01-29 DIAGNOSIS — E7800 Pure hypercholesterolemia, unspecified: Secondary | ICD-10-CM

## 2013-01-29 DIAGNOSIS — M4800 Spinal stenosis, site unspecified: Secondary | ICD-10-CM

## 2013-01-29 MED ORDER — VALSARTAN-HYDROCHLOROTHIAZIDE 160-12.5 MG PO TABS: 1.0000 | ORAL_TABLET | Freq: Every day | ORAL | Status: DC

## 2013-01-29 NOTE — Progress Notes (Signed)
Pre-visit discussion using our clinic review tool. No additional management support is needed unless otherwise documented below in the visit note.  

## 2013-01-30 ENCOUNTER — Telehealth: Payer: Self-pay | Admitting: Emergency Medicine

## 2013-01-30 NOTE — Telephone Encounter (Signed)
Referral for [place] has been filled out for [facility] and faxed.

## 2013-02-02 ENCOUNTER — Encounter: Payer: Self-pay | Admitting: Internal Medicine

## 2013-02-02 MED ORDER — PANTOPRAZOLE SODIUM 40 MG PO TBEC: 40.0000 mg | DELAYED_RELEASE_TABLET | Freq: Two times a day (BID) | ORAL | Status: DC

## 2013-02-02 MED ORDER — LEVOTHYROXINE SODIUM 150 MCG PO TABS: 150.0000 ug | ORAL_TABLET | Freq: Every day | ORAL | Status: DC

## 2013-02-02 MED ORDER — AMLODIPINE BESYLATE 5 MG PO TABS: 5.0000 mg | ORAL_TABLET | Freq: Every day | ORAL | Status: DC

## 2013-02-02 NOTE — Assessment & Plan Note (Signed)
Low cholesterol diet and exercise.  Follow.  Check lipid panel next fasting labs.     

## 2013-02-02 NOTE — Assessment & Plan Note (Signed)
Blood pressure under control. Same medication.  Check metabolic panel.

## 2013-02-02 NOTE — Assessment & Plan Note (Signed)
On thyroid replacement.  TSH just checked and slightly elevated.  Instructed her to take her thyroid medication by itself with water and wait an hour prior to taking other medications or eating.  Will start taking correctly.  Recheck tsh in one month.

## 2013-02-02 NOTE — Assessment & Plan Note (Signed)
Currently no bowel issues reported.  States her last colonoscopy was 2010.  Follow.

## 2013-02-02 NOTE — Assessment & Plan Note (Signed)
Sees GI.  Stable.  Follow.   

## 2013-02-02 NOTE — Assessment & Plan Note (Signed)
Seeing Dr Chasnis.  Follow.  Stable.   

## 2013-02-02 NOTE — Progress Notes (Signed)
Subjective:    Patient ID: Marissa Huff, female    DOB: August 29, 1931, 80 y.o.   MRN: [id number]  HPI 80 year old female with past history of hypertension, diabetes, and hypothyroidism who comes in today for a scheduled follow up.   Trying to watch what she eats.  She reports her sugars have been elevated.  States am sugars averaging 200. We discussed the importance of eating regular meals and snacks and not going a long period without eating.  We discussed the importance of eating a bedtime snack.  Discussed dietary modification.   No chest pain or tightness.  No increased sob.   No headache or dizziness.  Back pain - better/stable.  Saw Dr Pat Patrick in [place].  Has tramadol to take prn. Does not want surgery.  Went to [facility] and had an epidural.  Did not help.  Now seeing Dr Sharlet Salina.   She also reports she takes Protonix bid.  Acid reflux controlled.  She is being evaluated by Dr Jacqlyn Larsen. Had a CT.  States had an abnormal kidney.  Had stones.  Is s/p cystoscopy.   Was on daily suppressive abx for 3 months.  Off now.  Last urine check was clear.  Currently without urinary symptoms.   Seeing endocrinology for her diabetes.     Past Medical History  Diagnosis Date  . Diabetes mellitus without complication   . Hypothyroidism   . GERD (gastroesophageal reflux disease)   . Hypertension   . Gastric lymphoma   . Allergy   . Spinal stenosis     lumbar laminectomy  . PUD (peptic ulcer disease)   . Gastroparesis   . Diverticulosis   . Carpal tunnel syndrome, bilateral     Current Outpatient Prescriptions on File Prior to Visit  Medication Sig Dispense Refill  . acetaminophen (TYLENOL) 500 MG tablet Take 500 mg by mouth every 6 (six) hours as needed.      Marland Kitchen amLODipine (NORVASC) 5 MG tablet Take 1 tablet (5 mg total) by mouth daily.  90 tablet  3  . Calcium Carbonate-Vitamin D (CALCIUM 500 + D PO) Take by mouth 2 (two) times daily.       . diphenhydramine-acetaminophen (TYLENOL PM) 25-500 MG TABS Take  1 tablet by mouth at bedtime as needed.      . fexofenadine (ALLEGRA) 180 MG tablet Take 180 mg by mouth daily.      . fluticasone (FLONASE) 50 MCG/ACT nasal spray Place 2 sprays into the nose daily.      . insulin glargine (LANTUS) 100 UNIT/ML injection Inject 0.3 mLs (30 Units total) into the skin at bedtime.  10 mL  1  . levothyroxine (SYNTHROID, LEVOTHROID) 150 MCG tablet Take 1 tablet (150 mcg total) by mouth daily. Name brand only  90 tablet  3  . LORazepam (ATIVAN) 0.5 MG tablet 1/2 tablet q day prn  30 tablet  0  . Multiple Vitamins-Minerals (MULTIVITAMIN PO) Take by mouth daily.       . Multiple Vitamins-Minerals (PRESERVISION AREDS PO) Take by mouth 2 (two) times daily.      . pantoprazole (PROTONIX) 40 MG tablet Take 1 tablet (40 mg total) by mouth 2 (two) times daily.  180 tablet  1  . traMADol (ULTRAM) 50 MG tablet 1 to 2 tabs every 4 to six hours       No current facility-administered medications on file prior to visit.    Review of Systems Patient denies any headache, lightheadedness or  dizziness.  No significant sinus or allergy symptoms   No chest pain, tightness or palpitations.  No increased shortness of breath or congestion. Breathing stable.   No nausea or vomiting.  No acid reflux reported today.  On protonix.  No abdominal pain or cramping.  No bowel change, such as diarrhea, constipation, BRBPR or melana.  No urine change.   Back pain as outlined.   Sugars as outlined.  Adjusting her insulin.  She has had problems with sugars varying lately.  Has been elevated recently.  Now seeing endocrinology.  Has been taking her thyroid medication with her other medications.  TSH slightly elevated.        Objective:   Physical Exam  Filed Vitals:   01/29/13 1114  BP: 118/70  Pulse: 71  Temp: 98.2 F (36.8 C)   Blood pressure recheck:  23/75  80 year old female in no acute distress.   HEENT:  Nares- clear.  Oropharynx - without lesions. NECK:  Supple.  Nontender.  No  audible bruit.  HEART:  Appears to be regular. LUNGS:  No crackles or wheezing audible.  Respirations even and unlabored.  RADIAL PULSE:  Equal bilaterally.  ABDOMEN:  Soft, nontender.  Bowel sounds present and normal.  No audible abdominal bruit.   EXTREMITIES:  No increased edema present.  DP pulses palpable and equal bilaterally.    SKIN:  Feet without lesions.        Assessment & Plan:  BACK PAIN.  Persistent.  Seeing Dr Sharlet Salina.  Stable.     INCREASED STRESS.  Not a major issue.  Stable.     HEALTH MAINTENANCE.  Physical 04/12/12.   Mammogram 05/12/12 - BiRADS I.

## 2013-02-02 NOTE — Assessment & Plan Note (Signed)
Did not bring in any recorded sugar readings.  Last a1c improved to 7.2.  Discussed importance of eating regular meals, appropriate snacks.  States she is up to date with eye exams.   Followed at [facility] for her foot exams.  Varying sugars.  Seeing endocrinology.  Follow.  Sees Dr Joellyn Rued.

## 2013-02-02 NOTE — Assessment & Plan Note (Signed)
Continue protonix bid.   

## 2013-02-02 NOTE — Assessment & Plan Note (Signed)
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  Follow.  No acid reflux or upper symptoms reported today.  Follow.   

## 2013-02-04 NOTE — Telephone Encounter (Signed)
Pt has been approved to see Dr. Rudene Christians with 4 vists, esp 04/30/13. auth # [id number]

## 2013-02-07 ENCOUNTER — Telehealth: Payer: Self-pay | Admitting: *Deleted

## 2013-02-07 NOTE — Telephone Encounter (Signed)
Please contact pt how she would like refilled.  I thought she had told us previously name brand only.  Thanks.

## 2013-02-07 NOTE — Telephone Encounter (Signed)
Pharmacy Note:  New rx for levothyroxine but says name brand, should we fill for brand synthroid, brand levothroid or generic levothyroxine?

## 2013-02-07 NOTE — Telephone Encounter (Signed)
Please read below and advise about refill.

## 2013-02-08 NOTE — Telephone Encounter (Signed)
Referral for [facility] has been sent to [facility]

## 2013-02-08 NOTE — Telephone Encounter (Signed)
Faxed form back to pharmacy for the brand name Synthroid

## 2013-02-25 ENCOUNTER — Telehealth: Payer: Self-pay | Admitting: Internal Medicine

## 2013-02-25 NOTE — Telephone Encounter (Signed)
insulin glargine (LANTUS) 100 UNIT/ML injection    She wants the pens

## 2013-02-25 NOTE — Telephone Encounter (Signed)
Pt states she needs to have an x-ray on her right knee.  States she has been around this morning trying to get this done but was told Dr. Nicki Reaper would have to write an order.  Pt would like to have this done at [place].  She states she has not had a fall but she is having pain when she walks, when she bends her knee.

## 2013-02-25 NOTE — Telephone Encounter (Signed)
Pt has not been seen for this issue. Pt will need to schedule an appt to be evaluated before we can order anything.

## 2013-02-26 NOTE — Telephone Encounter (Signed)
Left vm for pt to call office to schedule appt °

## 2013-02-26 NOTE — Telephone Encounter (Signed)
She is seeing endocrinology now.  I think they are prescribing this now.  Let me know if a problem.

## 2013-02-26 NOTE — Telephone Encounter (Signed)
Notified pharmacy.

## 2013-02-27 ENCOUNTER — Other Ambulatory Visit: Payer: Self-pay | Admitting: Internal Medicine

## 2013-03-01 ENCOUNTER — Other Ambulatory Visit: Payer: Medicare HMO

## 2013-03-01 ENCOUNTER — Other Ambulatory Visit (INDEPENDENT_AMBULATORY_CARE_PROVIDER_SITE_OTHER): Payer: Commercial Managed Care - HMO

## 2013-03-01 DIAGNOSIS — E039 Hypothyroidism, unspecified: Secondary | ICD-10-CM

## 2013-03-01 LAB — TSH: TSH: 7.61 u[IU]/mL — ABNORMAL HIGH (ref 0.35–5.50)

## 2013-03-04 ENCOUNTER — Ambulatory Visit: Payer: Commercial Managed Care - HMO | Admitting: Adult Health

## 2013-03-04 ENCOUNTER — Telehealth: Payer: Self-pay | Admitting: *Deleted

## 2013-03-04 MED ORDER — SYNTHROID 175 MCG PO TABS: 175.0000 ug | ORAL_TABLET | Freq: Every day | ORAL | Status: DC

## 2013-03-04 NOTE — Telephone Encounter (Signed)
See result note.  

## 2013-03-04 NOTE — Telephone Encounter (Signed)
Message copied by Cecelia Byars on Mon Mar 04, 2013 12:46 PM ------      Message from: Alisa Graff      Created: Fri Mar 01, 2013  2:25 PM       Please call pt and confirm that she is on synthroid 137mcg q day.  If so, then increase to 167mcg q day.  Recheck tsh in 6 weeks. ------

## 2013-03-13 ENCOUNTER — Encounter: Payer: Self-pay | Admitting: Internal Medicine

## 2013-03-13 ENCOUNTER — Ambulatory Visit (INDEPENDENT_AMBULATORY_CARE_PROVIDER_SITE_OTHER): Payer: Medicare HMO | Admitting: Internal Medicine

## 2013-03-13 VITALS — BP 124/68 | HR 72 | Temp 98.0°F | Resp 12 | Wt 197.0 lb

## 2013-03-13 DIAGNOSIS — IMO0001 Reserved for inherently not codable concepts without codable children: Secondary | ICD-10-CM

## 2013-03-13 DIAGNOSIS — E1165 Type 2 diabetes mellitus with hyperglycemia: Principal | ICD-10-CM

## 2013-03-13 MED ORDER — INSULIN GLARGINE 100 UNIT/ML ~~LOC~~ SOLN: 30.0000 [IU] | Freq: Every day | SUBCUTANEOUS | Status: DC

## 2013-03-13 MED ORDER — INSULIN LISPRO 100 UNIT/ML (KWIKPEN): PEN_INJECTOR | SUBCUTANEOUS | Status: DC

## 2013-03-13 NOTE — Progress Notes (Signed)
Patient ID: Marissa Huff, female   DOB: 05/04/1931, 80 y.o.   MRN: [id number]  HPI: Marissa Huff is a 80 y.o.-year-old female, returning for f/u formanagement of DM2, dx 1980s, insulin-dependent, uncontrolled, without complications. Last visit 2 mo ago. She is here with her husband who offers part of the history.  Last hemoglobin A1c was: Lab Results  Component Value Date   HGBA1C 7.4* 01/09/2013   HGBA1C 7.4* 08/28/2012   HGBA1C 7.2* 04/26/2012   Pt is on a regimen of: - Lantus 30 units qhs - Humalog 14-12-12 units tid ac.  - Humalog SSi - 150-175: + 1 unit  - 176-200: + 2 units  - 201-225: + 3 units  - 226-250: + 4 units  - 251-275: + 5 units - 276-300: + 6 units  She was on Metformin >> "terrible" diarrhea. She stopped Victoza 1.8 mg - 1 week ago (ran out).   Pt checks her sugars 4-6x a day (no log) and they are: - am: 150-215 >> 100-238 (278) - 2h after b'fast: n/c >> 208, 229 - before lunch: 150-170 >> 93-208 - 2h after lunch: n/c >> 46, 47, 287 - before dinner: cannot remember >> 83-244 - 2h after dinner: n/c - bedtime: cannot remember >> 114-354, mostly 100-200s  Lowest sugar was 48 - in the pm - has lows every few days 50s; she has hypoglycemia awareness at 60. Highest sugar was 325 - evening.  Pt's meals are: - Breakfast: egg + toast + sausage or cheese toast, or oatmeal, or grits + sausage - Lunch: leftovers: beans + corn, or salad + light dressing (usually veggies) - Dinner: grilled chicken salad + crackers, or chicken crockpot + potatoes - Snacks: 2: popcorn or fruit or small piece of pie  - no CKD, last BUN/creatinine:  Lab Results  Component Value Date   BUN 28* 01/25/2013   CREATININE 0.9 01/25/2013  On Valsartan.  Has a h/o frequent UTIs. - last set of lipids: Lab Results  Component Value Date   CHOL 132 01/25/2013   HDL 49.70 01/25/2013   LDLCALC 59 01/25/2013   TRIG 117.0 01/25/2013   CHOLHDL 3 01/25/2013  Not on a statin.  - last eye exam - had  cataract Sx 02 and 03/2012. No DR.  - no numbness and tingling in her feet.  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  ROS: Constitutional: + weight gain, no fatigue, no subjective hyperthermia/hypothermia Eyes: no blurry vision, no xerophthalmia ENT: no sore throat, no nodules palpated in throat, no dysphagia/odynophagia, no hoarseness Cardiovascular: no CP/SOB/palpitations/leg swelling Respiratory: no cough/SOB Gastrointestinal: no N/V/D/C Musculoskeletal: no muscle/joint aches Skin: no rashes, + easy bruising Neurological: no tremors/numbness/tingling/dizziness  PE: BP 124/68  Pulse 72  Temp(Src) 98 F (36.7 C) (Oral)  Resp 12  Wt 197 lb (89.359 kg)  SpO2 97% Wt Readings from Last 3 Encounters:  03/13/13 197 lb (89.359 kg)  01/29/13 193 lb (87.544 kg)  01/09/13 191 lb 12.8 oz (87 kg)   Constitutional: overweight, in NAD Eyes: PERRLA, EOMI, no exophthalmos ENT: moist mucous membranes, no thyromegaly, no cervical lymphadenopathy Cardiovascular: RRR, No MRG Respiratory: CTA B Gastrointestinal: abdomen soft, NT, ND, BS+ Musculoskeletal: no deformities, strength intact in all 4 Skin: moist, warm, no rashes Neurological: no tremor with outstretched hands, DTR normal in all 4  ASSESSMENT: 1. DM2, non-insulin-dependent, uncontrolled, without complications  PLAN:  1. Patient with long-standing diabetes, on basal-bolus insulin  regimen, with last Hba1c at goal for her age. We again discussed about the need to avoid lows and she does have lows recently >> therefore I believe we need to change the mealtime insulin to be designed by the size of her meals. One other issue is that she tells me she can have a late b'fast (10 am) and an early lunch (12 pm) and she can have insulin stacking because of this >> hypoglycemia in pm. - I suggested to:  Patient Instructions  Please try to have consistent meal times and  amounts. Try to have at least 4h between b'fast and lunch. Change the insulin regimen as follows:  - Continue Lantus 30 units qhs - Use the Humalog as follows:  10 units for a small meal 12 units for a regular meal 14 units for a large meal - Continue the following Humalog sliding scale: - 150-175: + 1 unit  - 176-200: + 2 units  - 201-225: + 3 units  - 226-250: + 4 units  - >250: + 5 units  Please return in 1.5 month with your sugar log.   - Strongly advised her to start checking sugars at different times of the day - check 4 times a day, rotating checks - she is up to date with eye exams Return in about 6 weeks (around 04/24/2013).

## 2013-03-13 NOTE — Patient Instructions (Signed)
Please try to have consistent meal times and amounts. Try to have at least 4h between b'fast and lunch. Change the insulin regimen as follows:  - Continue Lantus 30 units qhs - Use the Humalog as follows:  10 units for a small meal 12 units for a regular meal 14 units for a large meal - Continue the following Humalog sliding scale: - 150-175: + 1 unit  - 176-200: + 2 units  - 201-225: + 3 units  - 226-250: + 4 units  - >250: + 5 units  Please return in 1.5 month with your sugar log.

## 2013-04-12 ENCOUNTER — Telehealth: Payer: Self-pay | Admitting: *Deleted

## 2013-04-12 DIAGNOSIS — E039 Hypothyroidism, unspecified: Secondary | ICD-10-CM

## 2013-04-12 NOTE — Telephone Encounter (Signed)
Pt coming in on Monday what labs and dx?  

## 2013-04-13 NOTE — Telephone Encounter (Signed)
Order placed for f/u tsh.  

## 2013-04-15 ENCOUNTER — Encounter: Payer: Self-pay | Admitting: Internal Medicine

## 2013-04-15 ENCOUNTER — Other Ambulatory Visit (INDEPENDENT_AMBULATORY_CARE_PROVIDER_SITE_OTHER): Payer: Commercial Managed Care - HMO

## 2013-04-15 DIAGNOSIS — E039 Hypothyroidism, unspecified: Secondary | ICD-10-CM

## 2013-04-15 DIAGNOSIS — G5600 Carpal tunnel syndrome, unspecified upper limb: Secondary | ICD-10-CM | POA: Insufficient documentation

## 2013-04-15 DIAGNOSIS — E119 Type 2 diabetes mellitus without complications: Secondary | ICD-10-CM | POA: Insufficient documentation

## 2013-04-15 DIAGNOSIS — E1069 Type 1 diabetes mellitus with other specified complication: Secondary | ICD-10-CM | POA: Insufficient documentation

## 2013-04-15 DIAGNOSIS — M6530 Trigger finger, unspecified finger: Secondary | ICD-10-CM | POA: Insufficient documentation

## 2013-04-15 DIAGNOSIS — D649 Anemia, unspecified: Secondary | ICD-10-CM | POA: Insufficient documentation

## 2013-04-15 LAB — TSH: TSH: 1.05 u[IU]/mL (ref 0.35–5.50)

## 2013-04-22 ENCOUNTER — Other Ambulatory Visit: Payer: Self-pay | Admitting: Internal Medicine

## 2013-04-24 ENCOUNTER — Ambulatory Visit (INDEPENDENT_AMBULATORY_CARE_PROVIDER_SITE_OTHER): Payer: Commercial Managed Care - HMO | Admitting: Internal Medicine

## 2013-04-24 ENCOUNTER — Encounter: Payer: Self-pay | Admitting: Internal Medicine

## 2013-04-24 VITALS — BP 130/78 | HR 60 | Temp 97.5°F | Wt 197.2 lb

## 2013-04-24 DIAGNOSIS — IMO0001 Reserved for inherently not codable concepts without codable children: Secondary | ICD-10-CM

## 2013-04-24 DIAGNOSIS — E1165 Type 2 diabetes mellitus with hyperglycemia: Principal | ICD-10-CM

## 2013-04-24 LAB — HEMOGLOBIN A1C: Hgb A1c MFr Bld: 7.9 % — ABNORMAL HIGH (ref 4.6–6.5)

## 2013-04-24 MED ORDER — DULAGLUTIDE 1.5 MG/0.5ML ~~LOC~~ SOAJ: SUBCUTANEOUS | Status: DC

## 2013-04-24 MED ORDER — DULAGLUTIDE 0.75 MG/0.5ML ~~LOC~~ SOAJ: 0.7500 mg | SUBCUTANEOUS | Status: DC

## 2013-04-24 NOTE — Progress Notes (Signed)
Pre visit review using our clinic review tool, if applicable. No additional management support is needed unless otherwise documented below in the visit note. 

## 2013-04-24 NOTE — Patient Instructions (Addendum)
Please increase Lantus to 33 units at night. Continue current Humalog doses: 10 units for a small meal 12 units for a regular meal 14 units for a large meal Continue the following Humalog sliding scale: - 150-175: + 1 unit  - 176-200: + 2 units  - 201-225: + 3 units  - 226-250: + 4 units  - >250: + 5 units Add Trulicity 9.78 mg under skin once a week on Sunday. After this, continue with 1.5 mg every week on Sundays.  Please return in 1.5 month with your sugar log.   Please stop at the lab.

## 2013-04-24 NOTE — Progress Notes (Signed)
Patient ID: Marissa Huff, female   DOB: 1931/10/11, 80 y.o.   MRN: [id number]  HPI: Marissa Huff is a 80 y.o.-year-old female, returning for f/u formanagement of DM2, dx 1980s, insulin-dependent, uncontrolled, without complications. Last visit 1.5 mo ago. She is here with her husband who offers part of the history.  Last hemoglobin A1c was: Lab Results  Component Value Date   HGBA1C 7.4* 01/09/2013   HGBA1C 7.4* 08/28/2012   HGBA1C 7.2* 04/26/2012   Pt is on a regimen of: - Lantus 30 units qhs - Humalog 10-14-12 for a small-regular-large meal - Humalog SSi - 150-175: + 1 unit  - 176-200: + 2 units  - 201-225: + 3 units  - 226-250: + 4 units  - 251-275: + 5 units - 276-300: + 6 units She was on Metformin >> "terrible" diarrhea. She stopped Victoza 1.8 mg - 1 week ago (ran out).   Pt checks her sugars 4-6x a day (no log) and they are: - am: 150-215 >> 100-238 (278) >> 166-248 - 2h after b'fast: n/c >> 208, 229 >> n/c - before lunch: 150-170 >> 93-208 >> 72, 88, 325 (most 100-200) - 2h after lunch: n/c >> 46, 47, 287 >> n/c  - before dinner: cannot remember >> 83-244 >> (67) 141-258 (301) - 2h after dinner: n/c - bedtime: cannot remember >> 114-354, mostly 100-200s >> 73-367  Lowest sugar was 49 - in the pm; she has hypoglycemia awareness at 60. Highest sugar was 367 - evening.  Pt's meals are: - Breakfast: egg + toast + sausage or cheese toast, or oatmeal, or grits + sausage - Lunch: leftovers: beans + corn, or salad + light dressing (usually veggies) - Dinner: grilled chicken salad + crackers, or chicken crockpot + potatoes - Snacks: 2: popcorn or fruit or small piece of pie  - no CKD, last BUN/creatinine:  Lab Results  Component Value Date   BUN 28* 01/25/2013   CREATININE 0.9 01/25/2013  On Valsartan.  Has a h/o frequent UTIs. - last set of lipids: Lab Results  Component Value Date   CHOL 132 01/25/2013   HDL 49.70 01/25/2013   LDLCALC 59 01/25/2013   TRIG 117.0  01/25/2013   CHOLHDL 3 01/25/2013  Not on a statin.  - last eye exam - had cataract Sx 02 and 03/2012. No DR.  - no numbness and tingling in her feet.  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  ROS: Constitutional: + weight gain, no fatigue, no subjective hyperthermia/hypothermia, + increased urination Eyes: no blurry vision, no xerophthalmia ENT: no sore throat, no nodules palpated in throat, no dysphagia/odynophagia, no hoarseness, + tinnitus, + hypoacusis Cardiovascular: no CP/SOB/palpitations/leg swelling Respiratory: + cough and wheezing/no SOB Gastrointestinal: no N/V/D/C Musculoskeletal: no muscle/joint aches Skin: no rashes, + easy bruising Neurological: no tremors/numbness/tingling/dizziness  I reviewed pt's medications, allergies, PMH, social hx, family hx and no changes required, except as mentioned above.  PE: BP 130/78  Pulse 60  Temp(Src) 97.5 F (36.4 C) (Oral)  Wt 197 lb 4 oz (89.472 kg)  SpO2 97% Wt Readings from Last 3 Encounters:  04/24/13 197 lb 4 oz (89.472 kg)  03/13/13 197 lb (89.359 kg)  01/29/13 193 lb (87.544 kg)   Constitutional: overweight, in NAD Eyes: PERRLA, EOMI, no exophthalmos ENT: moist mucous membranes, no thyromegaly, no cervical lymphadenopathy Cardiovascular: RRR, No MRG Respiratory: CTA B Gastrointestinal: abdomen soft, NT, ND, BS+ Musculoskeletal: no deformities, strength  intact in all 4 Skin: moist, warm, no rashes Neurological: no tremor with outstretched hands, DTR normal in all 4  ASSESSMENT: 1. DM2, non-insulin-dependent, uncontrolled, without complications  PLAN:  1. Patient with long-standing diabetes, on basal-bolus insulin regimen. She has very fluctuating sugars, mostly hyperglycemia but also some low CBGs. She tells me she liked Victoza as she lost 30 lbs on it >> we can try the once weekly Trulicity, I hope this is covered by her insurance. If she starts  this, may need decrease in Humalog dose. - I suggested to:  Patient Instructions  Please increase Lantus to 33 units at night. Continue current Humalog doses: 10 units for a small meal 12 units for a regular meal 14 units for a large meal Continue the following Humalog sliding scale: - 150-175: + 1 unit  - 176-200: + 2 units  - 201-225: + 3 units  - 226-250: + 4 units  - >250: + 5 units Add Trulicity 0.98 mg under skin once a week on Sunday. After this, continue with 1.5 mg every week on Sundays.  Please return in 1.5 month with your sugar log.   Please stop at the lab.  - continue checking sugars at different times of the day - check 4 times a day, rotating checks - last eye exam in 03/2012 >> needs a new one - check A1c today - RTC in 6 weeks.   Office Visit on 04/24/2013  Component Date Value Ref Range Status  . Hemoglobin A1C 04/24/2013 7.9* 4.6 - 6.5 % Final   Glycemic Control Guidelines for People with Diabetes:Non Diabetic:  <6%Goal of Therapy: <7%Additional Action Suggested:  >8%    HbA1c higher. See plan above.

## 2013-04-29 ENCOUNTER — Telehealth: Payer: Self-pay | Admitting: *Deleted

## 2013-04-29 NOTE — Telephone Encounter (Signed)
Pharmacy called stating the pt's insurance will not cover the trulicity. Please advise. Thank you.

## 2013-04-29 NOTE — Telephone Encounter (Signed)
Marissa Huff, can you please ask her to call and check with her insurance whether Tanzeum or Bydureon would be covered and how much would she have to pay out of pocket?

## 2013-05-01 ENCOUNTER — Other Ambulatory Visit: Payer: Self-pay | Admitting: *Deleted

## 2013-05-01 NOTE — Telephone Encounter (Signed)
Called pt and lvm asking her to check with her ins to see whether they will cover Tanzeum or Bydureon (spelled both names out for her) and asked her to call me back.

## 2013-05-03 ENCOUNTER — Ambulatory Visit (INDEPENDENT_AMBULATORY_CARE_PROVIDER_SITE_OTHER): Payer: Commercial Managed Care - HMO | Admitting: Internal Medicine

## 2013-05-03 ENCOUNTER — Encounter: Payer: Self-pay | Admitting: Internal Medicine

## 2013-05-03 VITALS — BP 118/70 | HR 66 | Temp 98.2°F | Ht 63.5 in | Wt 195.2 lb

## 2013-05-03 DIAGNOSIS — Z1239 Encounter for other screening for malignant neoplasm of breast: Secondary | ICD-10-CM

## 2013-05-03 DIAGNOSIS — K3184 Gastroparesis: Secondary | ICD-10-CM

## 2013-05-03 DIAGNOSIS — E1165 Type 2 diabetes mellitus with hyperglycemia: Secondary | ICD-10-CM

## 2013-05-03 DIAGNOSIS — C8583 Other specified types of non-Hodgkin lymphoma, intra-abdominal lymph nodes: Secondary | ICD-10-CM

## 2013-05-03 DIAGNOSIS — R198 Other specified symptoms and signs involving the digestive system and abdomen: Secondary | ICD-10-CM

## 2013-05-03 DIAGNOSIS — C8599 Non-Hodgkin lymphoma, unspecified, extranodal and solid organ sites: Secondary | ICD-10-CM

## 2013-05-03 DIAGNOSIS — I10 Essential (primary) hypertension: Secondary | ICD-10-CM

## 2013-05-03 DIAGNOSIS — K5790 Diverticulosis of intestine, part unspecified, without perforation or abscess without bleeding: Secondary | ICD-10-CM

## 2013-05-03 DIAGNOSIS — E7800 Pure hypercholesterolemia, unspecified: Secondary | ICD-10-CM

## 2013-05-03 DIAGNOSIS — M4800 Spinal stenosis, site unspecified: Secondary | ICD-10-CM

## 2013-05-03 DIAGNOSIS — E039 Hypothyroidism, unspecified: Secondary | ICD-10-CM

## 2013-05-03 DIAGNOSIS — K5730 Diverticulosis of large intestine without perforation or abscess without bleeding: Secondary | ICD-10-CM

## 2013-05-03 DIAGNOSIS — IMO0001 Reserved for inherently not codable concepts without codable children: Secondary | ICD-10-CM

## 2013-05-03 NOTE — Progress Notes (Signed)
Pre visit review using our clinic review tool, if applicable. No additional management support is needed unless otherwise documented below in the visit note. 

## 2013-05-03 NOTE — Progress Notes (Signed)
Subjective:    Patient ID: Marissa Huff, female    DOB: January 14, 1931, 80 y.o.   MRN: [id number]  HPI 80 year old female with past history of hypertension, diabetes, and hypothyroidism who comes in today to follow up on these issues as well as for a complete physical exam.   Trying to watch what she eats.  She reports her sugars have been elevated.  States am sugars averaging 130-165 and pm sugars 180-200s.  No lows.  Seeing endocrinology now. We discussed the importance of eating regular meals and snacks and not going a long period without eating.  We have discussed the importance of eating a bedtime snack.  Discussed dietary modification.   No chest pain or tightness.  No increased sob.   No headache or dizziness.  Back pain - better/stable.  Saw Dr Pat Patrick in [place].  Has tramadol to take prn. Does not want surgery.  Went to [facility] and had an epidural.  Did not help.  Now seeing Dr Sharlet Salina.   Due to f/u with him on 05/10/13.  She also reports she takes Protonix bid.  Acid reflux controlled.  She is being evaluated by Dr Jacqlyn Larsen. Had a CT.  States had an abnormal kidney.  Had stones.  Is s/p cystoscopy.   Was on daily suppressive abx for 3 months. Off now.  Last urine check was clear.  Currently without urinary symptoms.  She has noticed some bowel change.  Will have to strain with first bowel movement and then loose stool.  This is a change for her.  No abdominal pain.      Past Medical History  Diagnosis Date  . Diabetes mellitus without complication   . Hypothyroidism   . GERD (gastroesophageal reflux disease)   . Hypertension   . Gastric lymphoma   . Allergy   . Spinal stenosis     lumbar laminectomy  . PUD (peptic ulcer disease)   . Gastroparesis   . Diverticulosis   . Carpal tunnel syndrome, bilateral     Current Outpatient Prescriptions on File Prior to Visit  Medication Sig Dispense Refill  . acetaminophen (TYLENOL) 500 MG tablet Take 500 mg by mouth every 6 (six) hours as needed.       Marland Kitchen amLODipine (NORVASC) 5 MG tablet Take 1 tablet (5 mg total) by mouth daily.  90 tablet  3  . Calcium Carbonate-Vitamin D (CALCIUM 500 + D PO) Take by mouth 2 (two) times daily.       . diphenhydramine-acetaminophen (TYLENOL PM) 25-500 MG TABS Take 1 tablet by mouth at bedtime as needed.      . fexofenadine (ALLEGRA) 180 MG tablet Take 180 mg by mouth daily.      . fexofenadine-pseudoephedrine (ALLEGRA-D 24) 180-240 MG per 24 hr tablet       . fluticasone (FLONASE) 50 MCG/ACT nasal spray Place 2 sprays into the nose daily.      . insulin glargine (LANTUS) 100 UNIT/ML injection Inject 0.3 mLs (30 Units total) into the skin at bedtime. Pens please  - SOLOSTAR  15 mL  3  . insulin lispro (HUMALOG KWIKPEN) 100 UNIT/ML KiwkPen Inject under skin up to 42 units daily as advised  15 mL  3  . LORazepam (ATIVAN) 0.5 MG tablet 1/2 tablet q day prn  30 tablet  0  . Multiple Vitamins-Minerals (MULTIVITAMIN PO) Take by mouth daily.       . Multiple Vitamins-Minerals (PRESERVISION AREDS PO) Take by mouth 2 (two)  times daily.      . pantoprazole (PROTONIX) 40 MG tablet Take 1 tablet (40 mg total) by mouth 2 (two) times daily.  180 tablet  1  . SYNTHROID 175 MCG tablet TAKE 1 TABLET (175 MCG TOTAL) BY MOUTH DAILY BEFORE BREAKFAST.  30 tablet  11  . traMADol (ULTRAM) 50 MG tablet 1 to 2 tabs every 4 to six hours      . valsartan-hydrochlorothiazide (DIOVAN-HCT) 160-12.5 MG per tablet TAKE 1 TABLET BY MOUTH EVERY DAY  30 tablet  5   No current facility-administered medications on file prior to visit.    Review of Systems Patient denies any headache, lightheadedness or dizziness.  No significant sinus or allergy symptoms   No chest pain, tightness or palpitations.  No increased shortness of breath or congestion.  Breathing stable.   No nausea or vomiting.  No acid reflux reported today.  On protonix.  No abdominal pain or cramping.  Bowel change as outlined.  No urine change.   Back pain as outlined.   Planning  to follow up with Dr Sharlet Salina 05/10/13.  Sugars as outlined.  Adjusting her insulin.  She has had problems with sugars varying lately.  Has been elevated recently.  Now seeing endocrinology.         Objective:   Physical Exam  Filed Vitals:   05/03/13 1327  BP: 118/70  Pulse: 66  Temp: 98.2 F (36.8 C)   Blood pressure recheck:  79/68  80 year old female in no acute distress.   HEENT:  Nares- clear.  Oropharynx - without lesions. NECK:  Supple.  Nontender.  No audible bruit.  HEART:  Appears to be regular. LUNGS:  No crackles or wheezing audible.  Respirations even and unlabored.  RADIAL PULSE:  Equal bilaterally.  ABDOMEN:  Soft, nontender.  Bowel sounds present and normal.  No audible abdominal bruit.   EXTREMITIES:  No increased edema present.  DP pulses palpable and equal bilaterally.    SKIN:  Feet without lesions.        Assessment & Plan:  BACK PAIN.  Persistent.  Seeing Dr Sharlet Salina.  Due to follow up 05/10/13.     INCREASED STRESS.  Not a major issue.  Stable.     HEALTH MAINTENANCE.  Physical today.   Mammogram 05/12/12 - BiRADS I.  Schedule f/u mammogram.  Has been five  Years since last colonoscopy.  With the bowel issues and changes, will refer back to GI for evaluation and question of need for repeat colonoscopy.

## 2013-05-05 ENCOUNTER — Encounter: Payer: Self-pay | Admitting: Internal Medicine

## 2013-05-05 NOTE — Assessment & Plan Note (Signed)
On thyroid replacement.  Follow tsh.  

## 2013-05-05 NOTE — Assessment & Plan Note (Signed)
Seeing Dr Sharlet Salina.  Follow.  Has f/u 05/10/13.

## 2013-05-05 NOTE — Assessment & Plan Note (Signed)
Did not bring in any recorded sugar readings.  Have discussed importance of eating regular meals, appropriate snacks.  States she is up to date with eye exams.   Followed at [facility] for her foot exams.  Varying sugars.  Seeing endocrinology.  Follow.  Sees Dr Reid Patty.       

## 2013-05-05 NOTE — Assessment & Plan Note (Signed)
Sees GI.  Stable.  Follow.   

## 2013-05-05 NOTE — Assessment & Plan Note (Signed)
Low cholesterol diet and exercise.  Follow.  Check lipid panel next fasting labs.     

## 2013-05-05 NOTE — Assessment & Plan Note (Signed)
Blood pressure under control. Same medication.  Check metabolic panel.

## 2013-05-05 NOTE — Assessment & Plan Note (Signed)
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  Follow.  No acid reflux or upper symptoms reported today.  Follow.   

## 2013-05-05 NOTE — Assessment & Plan Note (Signed)
States her last colonoscopy was 2010.  Has the bowel change as outlined.  Refer back to GI for question of need for f/u colonoscopy.

## 2013-05-10 ENCOUNTER — Encounter: Payer: Self-pay | Admitting: *Deleted

## 2013-05-10 DIAGNOSIS — M25511 Pain in right shoulder: Secondary | ICD-10-CM | POA: Insufficient documentation

## 2013-05-10 DIAGNOSIS — M5416 Radiculopathy, lumbar region: Secondary | ICD-10-CM | POA: Insufficient documentation

## 2013-05-17 ENCOUNTER — Other Ambulatory Visit (INDEPENDENT_AMBULATORY_CARE_PROVIDER_SITE_OTHER): Payer: Commercial Managed Care - HMO

## 2013-05-17 ENCOUNTER — Telehealth: Payer: Self-pay | Admitting: Internal Medicine

## 2013-05-17 DIAGNOSIS — E1165 Type 2 diabetes mellitus with hyperglycemia: Principal | ICD-10-CM

## 2013-05-17 DIAGNOSIS — IMO0001 Reserved for inherently not codable concepts without codable children: Secondary | ICD-10-CM

## 2013-05-17 DIAGNOSIS — Z1239 Encounter for other screening for malignant neoplasm of breast: Secondary | ICD-10-CM

## 2013-05-17 DIAGNOSIS — E039 Hypothyroidism, unspecified: Secondary | ICD-10-CM

## 2013-05-17 DIAGNOSIS — E7800 Pure hypercholesterolemia, unspecified: Secondary | ICD-10-CM

## 2013-05-17 LAB — LIPID PANEL
Cholesterol: 138 mg/dL (ref 0–200)
HDL: 46.4 mg/dL (ref >39.00)
LDL Cholesterol: 77 mg/dL (ref 0–99)
Total CHOL/HDL Ratio: 3
Triglycerides: 73 mg/dL (ref 0.0–149.0)
VLDL: 14.6 mg/dL (ref 0.0–40.0)

## 2013-05-17 LAB — BASIC METABOLIC PANEL
BUN: 18 mg/dL (ref 6–23)
CO2: 28 mEq/L (ref 19–32)
Calcium: 9.4 mg/dL (ref 8.4–10.5)
Chloride: 101 mEq/L (ref 96–112)
Creatinine, Ser: 0.9 mg/dL (ref 0.4–1.2)
GFR: 64.83 mL/min (ref >60.00)
Glucose, Bld: 193 mg/dL — ABNORMAL HIGH (ref 70–99)
Potassium: 3.7 mEq/L (ref 3.5–5.1)
Sodium: 137 mEq/L (ref 135–145)

## 2013-05-17 LAB — HEPATIC FUNCTION PANEL
ALT: 18 U/L (ref 0–35)
AST: 20 U/L (ref 0–37)
Albumin: 3.9 g/dL (ref 3.5–5.2)
Alkaline Phosphatase: 75 U/L (ref 39–117)
Bilirubin, Direct: 0.1 mg/dL (ref 0.0–0.3)
Total Bilirubin: 0.7 mg/dL (ref 0.2–1.2)
Total Protein: 6.8 g/dL (ref 6.0–8.3)

## 2013-05-17 LAB — TSH: TSH: 2.11 u[IU]/mL (ref 0.35–4.50)

## 2013-05-17 LAB — MICROALBUMIN / CREATININE URINE RATIO
Creatinine,U: 55.1 mg/dL
Microalb Creat Ratio: 0.9 mg/g (ref 0.0–30.0)
Microalb, Ur: 0.5 mg/dL (ref 0.0–1.9)

## 2013-05-17 NOTE — Telephone Encounter (Signed)
Received fax from [place] back Auth# [id number] Treating Provider Dr. Gaylyn Cheers # of Visits 6 Start Date 5.11.15 End Date 8.9.15 CPT [phone] DX [phone]

## 2013-05-20 ENCOUNTER — Encounter: Payer: Self-pay | Admitting: Internal Medicine

## 2013-05-22 NOTE — Telephone Encounter (Signed)
Unread mychart message mailed to patient 

## 2013-05-29 ENCOUNTER — Ambulatory Visit (INDEPENDENT_AMBULATORY_CARE_PROVIDER_SITE_OTHER): Payer: Medicare HMO | Admitting: Internal Medicine

## 2013-05-29 ENCOUNTER — Encounter: Payer: Self-pay | Admitting: Internal Medicine

## 2013-05-29 VITALS — BP 118/64 | HR 72 | Temp 97.5°F | Resp 12 | Wt 196.0 lb

## 2013-05-29 DIAGNOSIS — IMO0001 Reserved for inherently not codable concepts without codable children: Secondary | ICD-10-CM

## 2013-05-29 DIAGNOSIS — E1165 Type 2 diabetes mellitus with hyperglycemia: Principal | ICD-10-CM

## 2013-05-29 NOTE — Patient Instructions (Signed)
Please increase Lantus to 35 units at night. Continue current Humalog doses: 10 units for a small meal 12 units for a regular meal 14 units for a large meal Continue the following Humalog sliding scale: - 150-175: + 1 unit  - 176-200: + 2 units  - 201-225: + 3 units  - 226-250: + 4 units  - >250: + 5 units  You will need another appointment in ~2 months.

## 2013-05-29 NOTE — Progress Notes (Signed)
Patient ID: Marissa Huff, female   DOB: [birth year], 80 y.o.   MRN: [id number]  HPI: Marissa Huff is a 80 y.o.-year-old female, returning for f/u formanagement of DM2, dx 1980s, insulin-dependent, uncontrolled, without complications. Last visit 1 mo ago. She is here with her husband who offers part of the history.  Last hemoglobin A1c was: Lab Results  Component Value Date   HGBA1C 7.9* 04/24/2013   HGBA1C 7.4* 01/09/2013   HGBA1C 7.4* 08/28/2012   Pt is on a regimen of: - Lantus 30 >> 33 units qhs - Humalog 10-14-12 for a small-regular-large meal - Humalog SSi - 150-175: + 1 unit  - 176-200: + 2 units  - 201-225: + 3 units  - 226-250: + 4 units  - 251-275: + 5 units - 276-300: + 6 units She was on Metformin >> "terrible" diarrhea. She stopped Victoza 1.8 mg - 1 week ago (ran out).   Pt checks her sugars 4-6x a day (reviewed log) and they are: - am: 150-215 >> 100-238 (278) >> 166-248 >> 190-309 - 2h after b'fast: n/c >> 208, 229 >> n/c - before lunch: 150-170 >> 93-208 >> 72, 88, 325 (most 100-200) >> 76-369 - 2h after lunch: n/c >> 46, 47, 287 >> n/c  - before dinner: cannot remember >> 83-244 >> (67) 141-258 (301) >> 87-378 - 2h after dinner: n/c - bedtime: cannot remember >> 114-354, mostly 100-200s >> 73-367 >> 63-362  Lowest sugar was 63; she has hypoglycemia awareness at 60. Highest sugar was 362 - evening.  Pt's meals are: - Breakfast: egg + toast + sausage or cheese toast, or oatmeal, or grits + sausage - Lunch: leftovers: beans + corn, or salad + light dressing (usually veggies) - Dinner: grilled chicken salad + crackers, or chicken crockpot + potatoes - Snacks: 2: popcorn or fruit or small piece of pie  - no CKD, last BUN/creatinine:  Lab Results  Component Value Date   BUN 18 05/17/2013   CREATININE 0.9 05/17/2013  On Valsartan.  Has a h/o frequent UTIs. - last set of lipids: Lab Results  Component Value Date   CHOL 138 05/17/2013   HDL 46.40 05/17/2013    LDLCALC 77 05/17/2013   TRIG 73.0 05/17/2013   CHOLHDL 3 05/17/2013  Not on a statin.  - last eye exam - had cataract Sx 02 and 03/2012. No DR.  - no numbness and tingling in her feet.  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  ROS: Constitutional: + weight gain, no fatigue, no subjective hyperthermia/hypothermia, + increased urination, + poor sleep Eyes: no blurry vision, no xerophthalmia ENT: no sore throat, no nodules palpated in throat, no dysphagia/odynophagia, no hoarseness, + tinnitus, + hypoacusis Cardiovascular: no CP/SOB/palpitations/leg swelling Respiratory: + cough and wheezing/no SOB Gastrointestinal: no N/V/D/C/+ heartburn Musculoskeletal: no muscle/joint aches Skin: no rash Neurological: no tremors/numbness/tingling/dizziness  I reviewed pt's medications, allergies, PMH, social hx, family hx and no changes required, except as mentioned above.  PE: BP 118/64  Pulse 72  Temp(Src) 97.5 F (36.4 C) (Oral)  Resp 12  Wt 196 lb (88.905 kg)  SpO2 98% Wt Readings from Last 3 Encounters:  05/29/13 196 lb (88.905 kg)  05/03/13 195 lb 4 oz (88.565 kg)  04/24/13 197 lb 4 oz (89.472 kg)   Constitutional: overweight, in NAD Eyes: PERRLA, EOMI, no exophthalmos ENT: moist mucous membranes, no thyromegaly, no cervical lymphadenopathy Cardiovascular: RRR, No MRG Respiratory: CTA B  Gastrointestinal: abdomen soft, NT, ND, BS+ Musculoskeletal: no deformities, strength intact in all 4 Skin: moist, warm, no rashes Neurological: no tremor with outstretched hands, DTR normal in all 4  ASSESSMENT: 1. DM2, non-insulin-dependent, uncontrolled, without complications  PLAN:  1. Patient with long-standing diabetes, on basal-bolus insulin regimen. She has very fluctuating sugars, mostly hyperglycemia but also some low CBGs. She really behaves as a LADA - we decided not to check Abs as positivity would not change  management. - Since sugars are higher in am, I suggested to increase the Lantus by 2 units. I believe we have reached the limit as for what we can do with her regimen >> if we increase the doses of insulin further >> lows. Patient Instructions  Please increase Lantus to 35 units at night. Continue current Humalog doses: 10 units for a small meal 12 units for a regular meal 14 units for a large meal Continue the following Humalog sliding scale: - 150-175: + 1 unit  - 176-200: + 2 units  - 201-225: + 3 units  - 226-250: + 4 units  - >250: + 5 units - continue checking sugars at different times of the day - check 4 times a day, rotating checks - last eye exam in 03/2012 >> needs a new one - RTC in 2 mo >> she would like to stay in the area and not travel to [place] >> will see Dr Howell Rucks. I advise them to call and schedule an appt with her in 2 mo from now.

## 2013-06-10 ENCOUNTER — Ambulatory Visit: Payer: Self-pay | Admitting: Physician Assistant

## 2013-06-11 ENCOUNTER — Ambulatory Visit: Payer: Commercial Managed Care - HMO | Admitting: Adult Health

## 2013-07-08 ENCOUNTER — Ambulatory Visit: Payer: Self-pay | Admitting: Internal Medicine

## 2013-07-08 LAB — HM MAMMOGRAPHY: HM Mammogram: NEGATIVE

## 2013-07-10 ENCOUNTER — Encounter: Payer: Self-pay | Admitting: Internal Medicine

## 2013-07-10 DIAGNOSIS — N1330 Unspecified hydronephrosis: Secondary | ICD-10-CM

## 2013-07-11 ENCOUNTER — Encounter: Payer: Self-pay | Admitting: Endocrinology

## 2013-07-11 ENCOUNTER — Ambulatory Visit (INDEPENDENT_AMBULATORY_CARE_PROVIDER_SITE_OTHER): Payer: Commercial Managed Care - HMO | Admitting: Endocrinology

## 2013-07-11 VITALS — BP 132/58 | HR 67 | Temp 97.9°F | Ht 63.5 in | Wt 191.5 lb

## 2013-07-11 DIAGNOSIS — I10 Essential (primary) hypertension: Secondary | ICD-10-CM

## 2013-07-11 DIAGNOSIS — E1165 Type 2 diabetes mellitus with hyperglycemia: Principal | ICD-10-CM

## 2013-07-11 DIAGNOSIS — IMO0001 Reserved for inherently not codable concepts without codable children: Secondary | ICD-10-CM

## 2013-07-11 MED ORDER — INSULIN LISPRO 100 UNIT/ML (KWIKPEN): PEN_INJECTOR | SUBCUTANEOUS | Status: DC

## 2013-07-11 MED ORDER — INSULIN GLARGINE 100 UNIT/ML ~~LOC~~ SOLN: 35.0000 [IU] | Freq: Every day | SUBCUTANEOUS | Status: DC

## 2013-07-11 NOTE — Progress Notes (Signed)
Patient ID: Marissa Huff, female   DOB: [birth year], 80 y.o.   MRN: [id number]  Marissa Huff is a 80 year old female who is accompanied by her husband and here for follow up of Diabetes Mellitus.  She was last seen by Dr Cruzita Lederer for her Diabetes care about 2 months ago, however doesn't wish to travel to [facility] and hence is transferring care here to [place].  She reports being diagnosed with type 2 diabetes mellitus about 40 years ago around 20s. Initially was tried on Metformin in the past, however was taken off due to GI intolerance/dairrhea and later has been on insulin therapy.She recalls being tried on Victoza in the past and reports tolerating it well. Was taken off this medication at last visit, as believed to be behaving like LADA and plan was to manage on current insulins.  Currently on basal bolus therapy with Lantus at 35 units daily and Humalog sliding scale at 10 units for a small meal, 12 units for a medium meal and 14 units for a large meal plus additional sliding scale.  Reports taking Humalog three times daily with her meals, no more than 10 minutes prior to a meal. Husband reports that occasionally she has been developing hypoglycemia right after she finishes her meal.  She brings her BS log and checks her sugars 4 times daily pre meals. Sugars are still high with occasional mild hypoglycemia. High variability.  Recently had bronchitis from which she is still recovering.   She has HTN and is on Diovan HCT and tolerating well. BP controlled at this visit.   PREMEAL Breakfast Lunch Dinner Bedtime Overall  Glucose range: 200-280 142-290s 200-300s 77-300s   Mean/median:        Lab Results  Component Value Date   HGBA1C 7.9* 04/24/2013   She has been monitoring her diet and trying to control her carbohydrates. Exercise and physical activity is limited due to back pain.   Past Medical History  Diagnosis Date  . Diabetes mellitus without complication   . Hypothyroidism   . GERD  (gastroesophageal reflux disease)   . Hypertension   . Gastric lymphoma   . Allergy   . Spinal stenosis     lumbar laminectomy  . PUD (peptic ulcer disease)   . Gastroparesis   . Diverticulosis   . Carpal tunnel syndrome, bilateral    Past Surgical History  Procedure Laterality Date  . Gastric lymphoma  1996    surgery  . Partial hysterectomy  1960    fibroid tumors, ovaries not removed  . Lumbar laminectomy  1999  . Laparoscopic cholecystectomy  6/01  . Carpal tunnel repai  10/10    left   . Arthroscopic left knee  12/05/08  . Antrectomy and vagotomy  1994  . Tonsillectomy    . Cataract extraction, bilateral    . Abdominal hysterectomy     History   Social History  . Marital Status: Married    Spouse Name: N/A    Number of Children: N/A  . Years of Education: N/A   Occupational History  . Not on file.   Social History Main Topics  . Smoking status: Never Smoker   . Smokeless tobacco: Never Used  . Alcohol Use: No  . Drug Use: No  . Sexual Activity: Not on file   Other Topics Concern  . Not on file   Social History Narrative  . No narrative on file   Current Outpatient Prescriptions on File Prior to Visit  Medication Sig Dispense Refill  . acetaminophen (TYLENOL) 500 MG tablet Take 500 mg by mouth every 6 (six) hours as needed.      Marland Kitchen amLODipine (NORVASC) 5 MG tablet Take 1 tablet (5 mg total) by mouth daily.  90 tablet  3  . Calcium Carbonate-Vitamin D (CALCIUM 500 + D PO) Take by mouth 2 (two) times daily.       . Cholecalciferol (VITAMIN D-3 PO) Take 1 capsule by mouth daily.      . diphenhydramine-acetaminophen (TYLENOL PM) 25-500 MG TABS Take 1 tablet by mouth at bedtime as needed.      . fexofenadine (ALLEGRA) 180 MG tablet Take 180 mg by mouth daily.      . fluticasone (FLONASE) 50 MCG/ACT nasal spray Place 2 sprays into the nose daily.      Marland Kitchen LORazepam (ATIVAN) 0.5 MG tablet 1/2 tablet q day prn  30 tablet  0  . Multiple Vitamins-Minerals  (MULTIVITAMIN PO) Take by mouth daily.       . Multiple Vitamins-Minerals (PRESERVISION AREDS PO) Take by mouth 2 (two) times daily.      . pantoprazole (PROTONIX) 40 MG tablet Take 1 tablet (40 mg total) by mouth 2 (two) times daily.  180 tablet  1  . SYNTHROID 175 MCG tablet TAKE 1 TABLET (175 MCG TOTAL) BY MOUTH DAILY BEFORE BREAKFAST.  30 tablet  11  . traMADol (ULTRAM) 50 MG tablet 1 to 2 tabs every 4 to six hours      . valsartan-hydrochlorothiazide (DIOVAN-HCT) 160-12.5 MG per tablet TAKE 1 TABLET BY MOUTH EVERY DAY  30 tablet  5   No current facility-administered medications on file prior to visit.   Allergies  Allergen Reactions  . Penicillins Rash   Family History  Problem Relation Age of Onset  . Stroke Mother   . Stroke Father   . Diabetes Brother   . Hypertension Father   . Breast cancer Daughter   . Colon cancer Neg Hx    Review of Systems  Constitutional: Negative for fever.  Respiratory: Positive for cough. Negative for shortness of breath and wheezing.   Cardiovascular: Negative for chest pain and leg swelling.  Gastrointestinal: Negative for nausea, vomiting, abdominal pain, diarrhea and constipation.  Neurological: Negative for tingling, sensory change and focal weakness.     Physical Exam  Constitutional: She appears well-developed and well-nourished. No distress.  HENT:  Head: Normocephalic and atraumatic.  Mouth/Throat: Oropharynx is clear and moist.  Eyes: Pupils are equal, round, and reactive to light.  Neck: Normal range of motion. Neck supple. No tracheal deviation present. No thyromegaly present.  Cardiovascular: Normal rate, regular rhythm, normal heart sounds and intact distal pulses.   Pulmonary/Chest: Effort normal and breath sounds normal.  Musculoskeletal: She exhibits no edema.  Lymphadenopathy:    She has no cervical adenopathy.  Psychiatric: She has a normal mood and affect.    Lab Results  Component Value Date   CREATININE 0.9  05/17/2013   CREATININE 0.9 01/25/2013   CREATININE 0.8 08/28/2012   Lab Results  Component Value Date   CHOL 138 05/17/2013   HDL 46.40 05/17/2013   LDLCALC 77 05/17/2013   TRIG 73.0 05/17/2013   CHOLHDL 3 05/17/2013   Lab Results  Component Value Date   ALT 18 05/17/2013   AST 20 05/17/2013   ALKPHOS 75 05/17/2013   BILITOT 0.7 05/17/2013   Lab Results  Component Value Date   HGBA1C 7.9* 04/24/2013   Lab Results  Component Value Date   MICROALBUR 0.5 05/17/2013    Assessment and Plan:  1. Type 2 diabetes mellitus, uncontrolled. Last A1c was not far from target given her age, and would aim for a less stringent A1c goal of around 7.5%. Goal sugars between 110-180. Has occasional mild hypoglycemia with other wise elevated sugars readings through the day. Most recent elevations could be secondary to her bronchitis episode in June. Expect some improvement in sugars with recovery. Continue checking sugars four times daily. Discussed adjusting basal bolus regimen at this time and patient is agreeable. Little early to update A1c, will do so at next visit in 1 month.  Could retry GLP-1 therapy at next visit, if patient is willing and tolerating it, as might still be helpful in controlling sugars.   Patient Instructions   Please continue Lantus at 35 units at night.  Change current Humalog doses:  10 units for a small meal  12 units for a regular meal  14 units for a large meal  Continue the following Humalog sliding scale:  - 150-175: + 1 unit  - 176-200: + 3 units  - 201-225: + 4 units  - 226-250: + 5 units  - >250: + 6 units  - continue checking sugars at different times of the day - check 4 times a day, rotating checks  Take humalog right after a meal based on pre meal sugars to see if incidence of postprandial hypoglycemia decreases.  - last eye exam in 12/2012 Follow up in 1 month  2. Hypertension- BP well controlled at this visit on current regimen. Continue.

## 2013-07-11 NOTE — Patient Instructions (Addendum)
PLAN:  1. Patient with long-standing diabetes, on basal-bolus insulin regimen.  Patient Instructions   Please icontinue Lantus at 35 units at night.  Change current Humalog doses:  10 units for a small meal  12 units for a regular meal  14 units for a large meal  Continue the following Humalog sliding scale:  - 150-175: + 1 unit  - 176-200: + 3 units  - 201-225: + 4 units  - 226-250: + 5 units  - >250: + 6 units  - continue checking sugars at different times of the day - check 4 times a day, rotating checks  Take humalog right after a meal based on pre meal sugars - last eye exam in 12/2012 Follow up in 1 month

## 2013-07-11 NOTE — Progress Notes (Signed)
Pre visit review using our clinic review tool, if applicable. No additional management support is needed unless otherwise documented below in the visit note. 

## 2013-07-15 DIAGNOSIS — M5136 Other intervertebral disc degeneration, lumbar region: Secondary | ICD-10-CM | POA: Insufficient documentation

## 2013-07-15 DIAGNOSIS — M51369 Other intervertebral disc degeneration, lumbar region without mention of lumbar back pain or lower extremity pain: Secondary | ICD-10-CM | POA: Insufficient documentation

## 2013-08-05 DIAGNOSIS — K5900 Constipation, unspecified: Secondary | ICD-10-CM | POA: Insufficient documentation

## 2013-08-13 ENCOUNTER — Encounter: Payer: Self-pay | Admitting: *Deleted

## 2013-08-13 ENCOUNTER — Other Ambulatory Visit: Payer: Self-pay | Admitting: *Deleted

## 2013-08-13 ENCOUNTER — Encounter: Payer: Self-pay | Admitting: Endocrinology

## 2013-08-13 ENCOUNTER — Ambulatory Visit (INDEPENDENT_AMBULATORY_CARE_PROVIDER_SITE_OTHER): Payer: Commercial Managed Care - HMO | Admitting: Endocrinology

## 2013-08-13 VITALS — BP 110/50 | HR 73 | Temp 98.1°F | Ht 63.5 in | Wt 187.8 lb

## 2013-08-13 DIAGNOSIS — IMO0001 Reserved for inherently not codable concepts without codable children: Secondary | ICD-10-CM

## 2013-08-13 DIAGNOSIS — E1165 Type 2 diabetes mellitus with hyperglycemia: Principal | ICD-10-CM

## 2013-08-13 DIAGNOSIS — I10 Essential (primary) hypertension: Secondary | ICD-10-CM

## 2013-08-13 LAB — HEMOGLOBIN A1C: Hgb A1c MFr Bld: 9.1 % — ABNORMAL HIGH (ref 4.6–6.5)

## 2013-08-13 MED ORDER — GLUCOSE BLOOD VI STRP: ORAL_STRIP | Status: DC

## 2013-08-13 MED ORDER — GLUCAGON (RDNA) 1 MG IJ KIT: 1.0000 mg | PACK | Freq: Once | INTRAMUSCULAR | Status: DC | PRN

## 2013-08-13 NOTE — Progress Notes (Signed)
Pre visit review using our clinic review tool, if applicable. No additional management support is needed unless otherwise documented below in the visit note. 

## 2013-08-13 NOTE — Patient Instructions (Addendum)
Insulin changes as follows- Labs today. Report is start to notice low blood sugars , especially after about 1 week, when effect of steroids is wearing off.    Blood Glucose (mg/dL)  Breakfast  (Units Humalog Insulin)  Lunch  (Units Humalog Insulin)  Supper  (Units Humalog Insulin)  Nighttime (Units Humalog Insulin)   <70 Treat the low blood sugar.  Recheck blood glucose  in 15 mins. If sugar is more than 70, then take the number of units of insulin in the 70-90 row, if before a meal.   70-90   7  10  10   0  91-130 (Base Dose)  12  17  17   0   131-150  13  18  18   0   151-200  14  19  19   0   201-250  15  20  20  0   251-300  16  21  21  0   301-350  17  22  22  0   351-400  18  23  23  1    401-450  19  24  24 2    >450  20  25  25  3    Lantus insulin 38 units subcutaneous Nightly

## 2013-08-13 NOTE — Progress Notes (Signed)
Reason for visit- Marissa Huff is a 80 year old female who is accompanied by her husband and here for follow up of Diabetes Mellitus.   HPI-  Last seen here about 1 month ago. Diagnosed with DM around 1970s, now on basal/bolus regimen with Lantus/Humalog. Was on GLP-1 before, but taken off as thought to be behaving like LADA. Has not tolerated Metformin in the past.  Currently, she is on - - lantus 35 units at bedtime. Has been occasionally titrating her dose upto 45 units when her sugars are running higher.  - Humalog scale after each meal ( taking this way to cut back on hypoglycemia) 10 units for a small meal  12 units for a regular meal  14 units for a large meal  Continue the following Humalog sliding scale:  - 150-175: + 1 unit  - 176-200: + 3 units  - 201-225: + 4 units  - 226-250: + 5 units  - >250: + 6 units   Lab Results  Component Value Date   HGBA1C 7.9* 04/24/2013    She has been monitoring her diet and trying to control her carbohydrates. Exercise and physical activity is limited due to back pain.   She brings her BS log and checks her sugars 4 times daily pre meals. Sugars are still high with occasional mild hypoglycemia. Now a days- consistently running higher due to recent steroid injection for back pain (~ 08/03/13).   PREMEAL Breakfast Lunch Dinner Bedtime Overall  Glucose range: 216-278 283-354 201-402 137-478   Mean/median:        Hypoglycemia- Few readings that are low- 2 episodes in the earlier part of July when they decreased to 47-59 range. Could be related to later meal that day versus increased physical activity per patient.   She has HTN and is on Diovan HCT and tolerating well. BP controlled at this visit.   I have reviewed her past medical history, medications and allergies.    Current Outpatient Prescriptions on File Prior to Visit  Medication Sig Dispense Refill  . acetaminophen (TYLENOL) 500 MG tablet Take 500 mg by mouth every 6 (six) hours as  needed.      Marland Kitchen amLODipine (NORVASC) 5 MG tablet Take 1 tablet (5 mg total) by mouth daily.  90 tablet  3  . Calcium Carbonate-Vitamin D (CALCIUM 500 + D PO) Take by mouth 2 (two) times daily.       . Cholecalciferol (VITAMIN D-3 PO) Take 1 capsule by mouth daily.      . fexofenadine (ALLEGRA) 180 MG tablet Take 180 mg by mouth daily.      . fluticasone (FLONASE) 50 MCG/ACT nasal spray Place 2 sprays into the nose daily.      . insulin glargine (LANTUS) 100 UNIT/ML injection Inject 0.35 mLs (35 Units total) into the skin at bedtime. Pens please  - SOLOSTAR  15 mL  3  . insulin lispro (HUMALOG KWIKPEN) 100 UNIT/ML KiwkPen Inject under skin up to 42 units daily as advised  15 mL  3  . LORazepam (ATIVAN) 0.5 MG tablet 1/2 tablet q day prn  30 tablet  0  . Multiple Vitamins-Minerals (MULTIVITAMIN PO) Take by mouth daily.       . Multiple Vitamins-Minerals (PRESERVISION AREDS PO) Take by mouth 2 (two) times daily.      . pantoprazole (PROTONIX) 40 MG tablet Take 1 tablet (40 mg total) by mouth 2 (two) times daily.  180 tablet  1  . SYNTHROID 175  MCG tablet TAKE 1 TABLET (175 MCG TOTAL) BY MOUTH DAILY BEFORE BREAKFAST.  30 tablet  11  . traMADol (ULTRAM) 50 MG tablet 1 to 2 tabs every 4 to six hours      . valsartan-hydrochlorothiazide (DIOVAN-HCT) 160-12.5 MG per tablet TAKE 1 TABLET BY MOUTH EVERY DAY  30 tablet  5   No current facility-administered medications on file prior to visit.   Allergies  Allergen Reactions  . Penicillins Rash    Review of Systems  Constitutional: Negative for weight loss and malaise/fatigue.  Eyes: Negative for blurred vision.  Respiratory: Positive for cough. Negative for shortness of breath.   Cardiovascular: Negative for chest pain and leg swelling.  Gastrointestinal: Negative for nausea, vomiting, abdominal pain, diarrhea and constipation.  Genitourinary: Negative for frequency.  Neurological: Negative for tingling and sensory change.   Filed Vitals:    08/13/13 1328  BP: 110/50  Pulse: 73  Temp: 98.1 F (36.7 C)    Physical Exam  Vitals reviewed. Constitutional: She appears well-developed and well-nourished. No distress.    Lab Results  Component Value Date   CREATININE 0.9 05/17/2013   CREATININE 0.9 01/25/2013   CREATININE 0.8 08/28/2012   Lab Results  Component Value Date   CHOL 138 05/17/2013   HDL 46.40 05/17/2013   LDLCALC 77 05/17/2013   TRIG 73.0 05/17/2013   CHOLHDL 3 05/17/2013   Lab Results  Component Value Date   ALT 18 05/17/2013   AST 20 05/17/2013   ALKPHOS 75 05/17/2013   BILITOT 0.7 05/17/2013   Lab Results  Component Value Date   HGBA1C 7.9* 04/24/2013   Lab Results  Component Value Date   MICROALBUR 0.5 05/17/2013    Assessment and Plan:  Problem List Items Addressed This Visit     Cardiovascular and Mediastinum   Hypertension     BP well controlled at this visit. Continue current medications.       Endocrine   Type II or unspecified type diabetes mellitus without mention of complication, uncontrolled - Primary (Chronic)     Last A1c was not far from target. Update today. Goal would be less stringent around 7.5%, with sugars between 110-180.  Elevated sugars recently likely from recent epidural injection. Adjusted her Lantus to 38 units daily at bedtime.  Change Humalog to 12+1 scale for BF, 17+1 lunch and 17+1 supper. Night time corrections of elevated sugars to be done with Humalog if sugars are > 350.  Detailed chart given to patient and included in instructions.  She will report back if she starts seeing lows again, expect that to happen in about 1 week once the effect of steroids wears off.  She will continue to take Humalog right after the meal based on premeal sugars.  Discussed hypoglycemia regimen and will send in RF for test strips and Glucagon kit for emergency use.  RTC 6 weeks.     Relevant Medications      glucose blood (ONE TOUCH ULTRA TEST) test strip      glucagon (GLUCAGON  EMERGENCY) 1 MG injection   Other Relevant Orders      Hemoglobin A1c      25 minutes spent with the patient, > 50% time spent on coordination of care and patient counselling on topics mentioned above.

## 2013-08-13 NOTE — Assessment & Plan Note (Signed)
Last A1c was not far from target. Update today. Goal would be less stringent around 7.5%, with sugars between 110-180.  Elevated sugars recently likely from recent epidural injection. Adjusted her Lantus to 38 units daily at bedtime.  Change Humalog to 12+1 scale for BF, 17+1 lunch and 17+1 supper. Night time corrections of elevated sugars to be done with Humalog if sugars are > 350.  Detailed chart given to patient and included in instructions.  She will report back if she starts seeing lows again, expect that to happen in about 1 week once the effect of steroids wears off.  She will continue to take Humalog right after the meal based on premeal sugars.  Discussed hypoglycemia regimen and will send in RF for test strips and Glucagon kit for emergency use.  RTC 6 weeks.

## 2013-08-13 NOTE — Assessment & Plan Note (Signed)
BP well controlled at this visit. Continue current medications.

## 2013-08-14 ENCOUNTER — Encounter: Payer: Self-pay | Admitting: *Deleted

## 2013-08-14 NOTE — Progress Notes (Signed)
Quick Note:  Marissa Huff-  Please could you call the patient with the results.   Diabetes is not controlled. A1c is above target of around 7.5% and increased as compared to last time. Please follow the plan discussed in clinic with respect to the dietary modifications, exercise and medication adjustments.  Please let me know if your sugars do not improve in a few weeks inspite of this.  ______

## 2013-08-15 NOTE — Telephone Encounter (Signed)
Mailed unread message to pt  

## 2013-08-16 ENCOUNTER — Telehealth: Payer: Self-pay | Admitting: Internal Medicine

## 2013-08-16 NOTE — Telephone Encounter (Signed)
Pt called in and stated she went to pick up glucose strips from [facility] and they told her they were needing a code from [facility] to be able to process them. Stated they have faxed and called over 3 times with no response and she is in need of the strips. Please Advise.

## 2013-08-16 NOTE — Telephone Encounter (Signed)
Rx faxed to [facility] with updated corrections & quantity

## 2013-08-26 DIAGNOSIS — N1330 Unspecified hydronephrosis: Secondary | ICD-10-CM | POA: Insufficient documentation

## 2013-09-03 ENCOUNTER — Ambulatory Visit (INDEPENDENT_AMBULATORY_CARE_PROVIDER_SITE_OTHER): Payer: Commercial Managed Care - HMO | Admitting: Internal Medicine

## 2013-09-03 ENCOUNTER — Encounter: Payer: Self-pay | Admitting: Internal Medicine

## 2013-09-03 VITALS — BP 122/80 | HR 81 | Temp 98.2°F | Ht 63.5 in | Wt 191.5 lb

## 2013-09-03 DIAGNOSIS — C8599 Non-Hodgkin lymphoma, unspecified, extranodal and solid organ sites: Secondary | ICD-10-CM

## 2013-09-03 DIAGNOSIS — I10 Essential (primary) hypertension: Secondary | ICD-10-CM

## 2013-09-03 DIAGNOSIS — N1330 Unspecified hydronephrosis: Secondary | ICD-10-CM

## 2013-09-03 DIAGNOSIS — E7800 Pure hypercholesterolemia, unspecified: Secondary | ICD-10-CM

## 2013-09-03 DIAGNOSIS — K3184 Gastroparesis: Secondary | ICD-10-CM

## 2013-09-03 DIAGNOSIS — K5730 Diverticulosis of large intestine without perforation or abscess without bleeding: Secondary | ICD-10-CM

## 2013-09-03 DIAGNOSIS — C8583 Other specified types of non-Hodgkin lymphoma, intra-abdominal lymph nodes: Secondary | ICD-10-CM

## 2013-09-03 DIAGNOSIS — IMO0001 Reserved for inherently not codable concepts without codable children: Secondary | ICD-10-CM

## 2013-09-03 DIAGNOSIS — L989 Disorder of the skin and subcutaneous tissue, unspecified: Secondary | ICD-10-CM

## 2013-09-03 DIAGNOSIS — E1165 Type 2 diabetes mellitus with hyperglycemia: Secondary | ICD-10-CM

## 2013-09-03 DIAGNOSIS — M4800 Spinal stenosis, site unspecified: Secondary | ICD-10-CM

## 2013-09-03 DIAGNOSIS — E039 Hypothyroidism, unspecified: Secondary | ICD-10-CM

## 2013-09-03 DIAGNOSIS — K279 Peptic ulcer, site unspecified, unspecified as acute or chronic, without hemorrhage or perforation: Secondary | ICD-10-CM

## 2013-09-03 DIAGNOSIS — Z23 Encounter for immunization: Secondary | ICD-10-CM

## 2013-09-03 MED ORDER — PANTOPRAZOLE SODIUM 40 MG PO TBEC: 40.0000 mg | DELAYED_RELEASE_TABLET | Freq: Two times a day (BID) | ORAL | Status: DC

## 2013-09-03 MED ORDER — LORAZEPAM 0.5 MG PO TABS: ORAL_TABLET | ORAL | Status: DC

## 2013-09-03 MED ORDER — FLUTICASONE PROPIONATE 50 MCG/ACT NA SUSP: 2.0000 | Freq: Every day | NASAL | Status: DC

## 2013-09-03 MED ORDER — ALBUTEROL SULFATE HFA 108 (90 BASE) MCG/ACT IN AERS: 2.0000 | INHALATION_SPRAY | Freq: Four times a day (QID) | RESPIRATORY_TRACT | Status: DC | PRN

## 2013-09-03 MED ORDER — VALSARTAN-HYDROCHLOROTHIAZIDE 160-12.5 MG PO TABS: 1.0000 | ORAL_TABLET | Freq: Every day | ORAL | Status: DC

## 2013-09-03 NOTE — Progress Notes (Signed)
Pre visit review using our clinic review tool, if applicable. No additional management support is needed unless otherwise documented below in the visit note. 

## 2013-09-08 ENCOUNTER — Encounter: Payer: Self-pay | Admitting: Internal Medicine

## 2013-09-08 DIAGNOSIS — L989 Disorder of the skin and subcutaneous tissue, unspecified: Secondary | ICD-10-CM | POA: Insufficient documentation

## 2013-09-08 NOTE — Assessment & Plan Note (Signed)
On thyroid replacement.  Follow tsh.  

## 2013-09-08 NOTE — Assessment & Plan Note (Signed)
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  Follow.  No acid reflux or upper symptoms reported today.  Follow.

## 2013-09-08 NOTE — Assessment & Plan Note (Signed)
States her last colonoscopy was 2010.  Was referred back to GI for further evaluation and treatment.  See Dr Myrna Blazer note for details.  Appears to be stable.

## 2013-09-08 NOTE — Assessment & Plan Note (Signed)
Persistent nasal lesion.  Refer to dermatology for further evaluation.

## 2013-09-08 NOTE — Progress Notes (Signed)
Subjective:    Patient ID: Marissa Huff, female    DOB: 16-Apr-1931, 80 y.o.   MRN: [id number]  HPI 80 year old female with past history of hypertension, diabetes, and hypothyroidism who comes in today for a scheduled follow up.  Trying to watch what she eats.  Seeing endocrinology.  They have adjusted her medications.  See note for details.  We have discussed the importance of eating regular meals and snacks and not going a long period without eating.  We have discussed the importance of eating a bedtime snack.  Discussed dietary modification.   No chest pain or tightness.  No increased sob.   No headache or dizziness.  Back pain - better/stable.  Saw Dr Pat Patrick in [place].  Has tramadol to take prn. Does not want surgery.  Went to [facility] pain [facility] and had an epidural.  Did not help.  Now seeing Dr Sharlet Salina.   Taking tramadol and muscle relaxer.  Stable.  Takes Protonix bid.  Acid reflux controlled.  She is being evaluated by Dr Jacqlyn Larsen. Had a CT.  States had an abnormal kidney.  Had stones.  Is s/p cystoscopy.   Currently without urinary symptoms.  She has noticed some bowel change.   No abdominal pain.  She was seen recently at urgent care ([place]).  Diagnosed with acute bronchitis.  Being treated.  Feels better.      Past Medical History  Diagnosis Date  . Diabetes mellitus without complication   . Hypothyroidism   . GERD (gastroesophageal reflux disease)   . Hypertension   . Gastric lymphoma   . Allergy   . Spinal stenosis     lumbar laminectomy  . PUD (peptic ulcer disease)   . Gastroparesis   . Diverticulosis   . Carpal tunnel syndrome, bilateral     Current Outpatient Prescriptions on File Prior to Visit  Medication Sig Dispense Refill  . acetaminophen (TYLENOL) 500 MG tablet Take 500 mg by mouth every 6 (six) hours as needed.      Marland Kitchen amLODipine (NORVASC) 5 MG tablet Take 1 tablet (5 mg total) by mouth daily.  90 tablet  3  . Cholecalciferol (VITAMIN D-3 PO) Take 1 capsule by mouth daily.       Marland Kitchen DOXYLAMINE SUCCINATE, SLEEP, PO Take by mouth.      . fexofenadine (ALLEGRA) 180 MG tablet Take 180 mg by mouth daily.      Marland Kitchen glucagon (GLUCAGON EMERGENCY) 1 MG injection Inject 1 mg into the muscle once as needed.  1 each  12  . glucose blood (ONE TOUCH ULTRA TEST) test strip Use as instructed four times daily  400 each  2  . insulin glargine (LANTUS) 100 UNIT/ML injection Inject 0.35 mLs (35 Units total) into the skin at bedtime. Pens please  - SOLOSTAR  15 mL  3  . insulin lispro (HUMALOG KWIKPEN) 100 UNIT/ML KiwkPen Inject under skin up to 42 units daily as advised  15 mL  3  . Multiple Vitamins-Minerals (MULTIVITAMIN PO) Take by mouth daily.       . Multiple Vitamins-Minerals (PRESERVISION AREDS PO) Take by mouth 2 (two) times daily.      Marland Kitchen SYNTHROID 175 MCG tablet TAKE 1 TABLET (175 MCG TOTAL) BY MOUTH DAILY BEFORE BREAKFAST.  30 tablet  11  . traMADol (ULTRAM) 50 MG tablet 1 to 2 tabs every 4 to six hours       No current facility-administered medications on file prior to visit.  Review of Systems Patient denies any headache, lightheadedness or dizziness.  No significant sinus or allergy symptoms   No chest pain, tightness or palpitations.  No increased shortness of breath.  Some previous congestion.  Recently treated for acute bronchitis.  Breathing stable.   No nausea or vomiting.  No acid reflux reported today.  On protonix.  No abdominal pain or cramping.  Bowels stable.  No urine change.   Back pain as outlined.   Seeing Dr Sharlet Salina.   Adjusting her insulin.  She has had problems with sugars varying lately.  Has been elevated recently.  Now seeing endocrinology.  We discussed her memory issues.  She states she has more problems with recalling words at times.  Still pays her bills, drives, cooks with recipes, etc.  No problems with doing these activities.         Objective:   Physical Exam  Filed Vitals:   09/03/13 1452  BP: 122/80  Pulse: 81  Temp: 98.2 F (36.8 C)    Blood pressure recheck:  62/56  80 year old female in no acute distress.   HEENT:  Nares- clear.  Oropharynx - without lesions. NECK:  Supple.  Nontender.  No audible bruit.  HEART:  Appears to be regular. LUNGS:  No crackles or wheezing audible.  Respirations even and unlabored.  RADIAL PULSE:  Equal bilaterally.  ABDOMEN:  Soft, nontender.  Bowel sounds present and normal.  No audible abdominal bruit.   EXTREMITIES:  No increased edema present.  DP pulses palpable and equal bilaterally.    SKIN:  Feet without lesions.        Assessment & Plan:  BACK PAIN.  Persistent.  Seeing Dr Sharlet Salina.     INCREASED STRESS.  Not a major issue.  Stable.     HEALTH MAINTENANCE.  Physical 05/03/13.   Mammogram 07/08/13 - BiRADS I.  Has been five  Years since last colonoscopy.  With the bowel issues and changes, was referred back to GI.  See Dr Myrna Blazer note for details.

## 2013-09-08 NOTE — Assessment & Plan Note (Signed)
Did not bring in any recorded sugar readings.  Have discussed importance of eating regular meals, appropriate snacks.  States she is up to date with eye exams.   Followed at [facility] for her foot exams.  Varying sugars.  Seeing endocrinology.  Follow.  Sees Dr Joellyn Rued.

## 2013-09-08 NOTE — Assessment & Plan Note (Signed)
Low cholesterol diet and exercise.  Follow.  Check lipid panel next fasting labs.     

## 2013-09-08 NOTE — Assessment & Plan Note (Signed)
Continue protonix bid.   

## 2013-09-08 NOTE — Assessment & Plan Note (Signed)
Sees GI.  Stable.  Follow.

## 2013-09-08 NOTE — Assessment & Plan Note (Signed)
Seeing Dr Chasnis.  On tramadol and has muscle relaxer if needed.  Stable.   

## 2013-09-08 NOTE — Assessment & Plan Note (Signed)
Continue to follow up with Dr Jacqlyn Larsen.  Follow renal function.

## 2013-09-08 NOTE — Assessment & Plan Note (Signed)
BP well controlled at this visit. Continue current medications.

## 2013-09-12 ENCOUNTER — Ambulatory Visit: Payer: Self-pay | Admitting: Physical Medicine and Rehabilitation

## 2013-09-18 ENCOUNTER — Telehealth: Payer: Self-pay | Admitting: *Deleted

## 2013-09-18 DIAGNOSIS — I10 Essential (primary) hypertension: Secondary | ICD-10-CM

## 2013-09-18 DIAGNOSIS — E7800 Pure hypercholesterolemia, unspecified: Secondary | ICD-10-CM

## 2013-09-18 DIAGNOSIS — E039 Hypothyroidism, unspecified: Secondary | ICD-10-CM

## 2013-09-18 DIAGNOSIS — E1165 Type 2 diabetes mellitus with hyperglycemia: Secondary | ICD-10-CM

## 2013-09-18 DIAGNOSIS — IMO0001 Reserved for inherently not codable concepts without codable children: Secondary | ICD-10-CM

## 2013-09-18 NOTE — Telephone Encounter (Signed)
Pt is coming tomorrow what labs and dx?  

## 2013-09-18 NOTE — Telephone Encounter (Signed)
Orders placed for labs

## 2013-09-19 ENCOUNTER — Other Ambulatory Visit (INDEPENDENT_AMBULATORY_CARE_PROVIDER_SITE_OTHER): Payer: Commercial Managed Care - HMO

## 2013-09-19 DIAGNOSIS — E7800 Pure hypercholesterolemia, unspecified: Secondary | ICD-10-CM

## 2013-09-19 DIAGNOSIS — I10 Essential (primary) hypertension: Secondary | ICD-10-CM

## 2013-09-19 LAB — LIPID PANEL
Cholesterol: 129 mg/dL (ref 0–200)
HDL: 35.2 mg/dL — ABNORMAL LOW (ref >39.00)
LDL Cholesterol: 79 mg/dL (ref 0–99)
NonHDL: 93.8
Total CHOL/HDL Ratio: 4
Triglycerides: 72 mg/dL (ref 0.0–149.0)
VLDL: 14.4 mg/dL (ref 0.0–40.0)

## 2013-09-19 LAB — BASIC METABOLIC PANEL
BUN: 20 mg/dL (ref 6–23)
CO2: 26 mEq/L (ref 19–32)
Calcium: 9.4 mg/dL (ref 8.4–10.5)
Chloride: 101 mEq/L (ref 96–112)
Creatinine, Ser: 0.9 mg/dL (ref 0.4–1.2)
GFR: 64.78 mL/min (ref >60.00)
Glucose, Bld: 333 mg/dL — ABNORMAL HIGH (ref 70–99)
Potassium: 4 mEq/L (ref 3.5–5.1)
Sodium: 135 mEq/L (ref 135–145)

## 2013-09-19 LAB — HEPATIC FUNCTION PANEL
ALT: 15 U/L (ref 0–35)
AST: 17 U/L (ref 0–37)
Albumin: 3.8 g/dL (ref 3.5–5.2)
Alkaline Phosphatase: 97 U/L (ref 39–117)
Bilirubin, Direct: 0.1 mg/dL (ref 0.0–0.3)
Total Bilirubin: 0.5 mg/dL (ref 0.2–1.2)
Total Protein: 7.2 g/dL (ref 6.0–8.3)

## 2013-09-24 ENCOUNTER — Ambulatory Visit (INDEPENDENT_AMBULATORY_CARE_PROVIDER_SITE_OTHER): Payer: Commercial Managed Care - HMO | Admitting: Endocrinology

## 2013-09-24 VITALS — BP 122/60 | HR 77 | Wt 190.5 lb

## 2013-09-24 DIAGNOSIS — E1165 Type 2 diabetes mellitus with hyperglycemia: Principal | ICD-10-CM

## 2013-09-24 DIAGNOSIS — IMO0001 Reserved for inherently not codable concepts without codable children: Secondary | ICD-10-CM

## 2013-09-24 DIAGNOSIS — I10 Essential (primary) hypertension: Secondary | ICD-10-CM

## 2013-09-24 MED ORDER — INSULIN LISPRO 100 UNIT/ML (KWIKPEN): PEN_INJECTOR | SUBCUTANEOUS | Status: DC

## 2013-09-24 MED ORDER — LIRAGLUTIDE 18 MG/3ML ~~LOC~~ SOPN: PEN_INJECTOR | SUBCUTANEOUS | Status: DC

## 2013-09-24 NOTE — Progress Notes (Signed)
Pre visit review using our clinic review tool, if applicable. No additional management support is needed unless otherwise documented below in the visit note. 

## 2013-09-24 NOTE — Assessment & Plan Note (Addendum)
Last A1c was not far from target elevated. Patient having elevated sugars due to recent steroid injections in her back as well. Goal sugars could be little higher between 110-180.  Discussed about adding GLP-1 agent back up versus try to control on basal/bolus regimen.  She would like to try the Victoza again. Aware of side effects and risk profile. Start at 0.6 mg [place] daily and increase to 1.2 mg [place] daily. Continue Lantus at 38 units daily at bedtime.  Change Humalog to 14+1 scale for BF, 18+1 lunch and 18+1 supper. Night time corrections of elevated sugars to be done with Humalog if sugars are > 350.  Detailed chart given to patient and included in instructions.  She will report back if she starts seeing lows again, expect that to happen in about 1 week once the effect of steroids wears off.  She will continue to take Humalog right after the meal based on premeal sugars.   RTC 6 weeks.

## 2013-09-24 NOTE — Assessment & Plan Note (Signed)
BP well controlled at this visit. Continue current medications.  Last urine MA negative May 2015.        

## 2013-09-24 NOTE — Patient Instructions (Signed)
Please follow insulin scale as below-  Discussed with the patient the nature of GLP-1 drugs, the action on various organ systems, how they benefit blood glucose control, as well as the benefit of weight loss and  increase satiety . Explained possible side effects especially nausea and vomiting; discussed safety information in package insert. Described injection technique and dosage titration of Victoza  starting with 0.6 mg once a day at the same time for the first 1-2 week and then increasing to 1.2 mg if no symptoms of nausea.                  Blood Glucose (mg/dL)  Breakfast  (Units Humalog Insulin)  Lunch  (Units Humalog Insulin)  Supper  (Units Humalog Insulin)  Nighttime (Units Humalog Insulin)   <70 Treat the low blood sugar.  Recheck blood glucose  in 15 mins. If sugar is more than 70, then take the number of units of insulin in the 70-90 row, if before a meal.   70-90   8  11  11   0  91-130 (Base Dose)  14  18  18   0   131-150  15  19  19   0   151-200  16  20  20   0   201-250  17  21  21  0   251-300  18  22  22 1    301-350  19  23  23 2    351-400  20  24  24  3    401-450  21  25  25 4    >450  22  26  26  5    Lantus insulin 38 units subcutaneous Nightly    Check sugars 4 x daily ( before each meal and at bedtime).  Record them in a log book and bring that/meter to next appointment.   RTC 6 weeks

## 2013-09-24 NOTE — Progress Notes (Signed)
Reason for visit- Marissa Huff is a 80 year old female who is accompanied by her husband and here for follow up of  type 2 Diabetes Mellitus, uncontrolled, with complications (?Stage 2 CKD).   HPI-  Last seen here about 6 weeks ago. Diagnosed with DM around 1970s, now on basal/bolus regimen with Lantus/Humalog. Was on GLP-1 before, but taken off as thought to be behaving like LADA versus ran out of medication around Spring/Summer 2015. Was on Victoza in the past, and reports tolerating it well. Trulicity not covered under her plan and was never filled. Has not tolerated Metformin in the past due to diarrhea.   Currently, Marissa Huff is on - - lantus 38 units at bedtime.  - Humalog scale after each meal ( taking this way to cut back on hypoglycemia) 12+1 BF, 17+1 lunch and 17+1 supper Has been following scale as instructed.  Lab Results  Component Value Date   HGBA1C 9.1 Repeated and verified X2.* 08/13/2013   HGBA1C 7.9* 04/24/2013   HGBA1C 7.4* 01/09/2013     Marissa Huff has been monitoring her diet and trying to control her carbohydrates. Exercise and physical activity is limited due to back pain. Reports having another epidural injection last week and sugars have been higher due to this.   Marissa Huff brings her BS log and checks her sugars 4 times daily pre meals. Sugars are still high with occasional pre dinner hypoglycemia on 2 days ( ? Unknown trigger, but possibly related to less food intake at lunch)   PREMEAL Breakfast Lunch Dinner Bedtime Overall  Glucose range: 152-280s 200-300 194-357 169-324   Mean/median:        Hypoglycemia- Few readings that are low- 2 days in early sept, when her FS decreased to 40-60 range prior to dinner. Has had modified symptoms of hypoglycemia awareness since her diagnosis, now just feels unwell and doesn't get the typical symptoms.    Nephropathy- ?Yes, Stage 2 CKD, last BUN/Cr were- GFR 65  Lab Results  Component Value Date   BUN 20 09/19/2013   CREATININE 0.9  09/19/2013   Lab Results  Component Value Date   MICROALBUR 0.5 05/17/2013   Retinopathy- No, Last DEE was 3 weeks ago No known neuropathy. Denies any tingling/numbness in her feet No prior CAD. No hx stroke. Has hypothyroidism that is being treated with Synthroid.  Last TSH was - Lab Results  Component Value Date   TSH 2.11 05/17/2013   Lipids- on dietary therapy. Lab Results  Component Value Date   CHOL 129 09/19/2013   HDL 35.20* 09/19/2013   LDLCALC 79 09/19/2013   TRIG 72.0 09/19/2013   CHOLHDL 4 09/19/2013      Hypertension- Marissa Huff  is on Diovan HCT and tolerating well. BP controlled at this visit.   I have reviewed her past medical history, medications and allergies.    Current Outpatient Prescriptions on File Prior to Visit  Medication Sig Dispense Refill  . acetaminophen (TYLENOL) 500 MG tablet Take 500 mg by mouth every 6 (six) hours as needed.      Marland Kitchen albuterol (PROVENTIL HFA;VENTOLIN HFA) 108 (90 BASE) MCG/ACT inhaler Inhale 2 puffs into the lungs every 6 (six) hours as needed for wheezing or shortness of breath.  1 Inhaler  1  . amLODipine (NORVASC) 5 MG tablet Take 1 tablet (5 mg total) by mouth daily.  90 tablet  3  . Cholecalciferol (VITAMIN D-3 PO) Take 1 capsule by mouth daily.      Marland Kitchen DOXYLAMINE SUCCINATE,  SLEEP, PO Take by mouth.      . fexofenadine (ALLEGRA) 180 MG tablet Take 180 mg by mouth daily.      . fluticasone (FLONASE) 50 MCG/ACT nasal spray Place 2 sprays into both nostrils daily.  48 g  3  . glucagon (GLUCAGON EMERGENCY) 1 MG injection Inject 1 mg into the muscle once as needed.  1 each  12  . glucose blood (ONE TOUCH ULTRA TEST) test strip Use as instructed four times daily  400 each  2  . imipramine (TOFRANIL) 25 MG tablet Take 25 mg by mouth at bedtime.      Marland Kitchen LORazepam (ATIVAN) 0.5 MG tablet 1/2 tablet q day prn  30 tablet  0  . Multiple Vitamins-Minerals (MULTIVITAMIN PO) Take by mouth daily.       . Multiple Vitamins-Minerals (PRESERVISION AREDS  PO) Take by mouth 2 (two) times daily.      . pantoprazole (PROTONIX) 40 MG tablet Take 1 tablet (40 mg total) by mouth 2 (two) times daily.  180 tablet  1  . SYNTHROID 175 MCG tablet TAKE 1 TABLET (175 MCG TOTAL) BY MOUTH DAILY BEFORE BREAKFAST.  30 tablet  11  . traMADol (ULTRAM) 50 MG tablet 1 to 2 tabs every 4 to six hours      . valsartan-hydrochlorothiazide (DIOVAN-HCT) 160-12.5 MG per tablet Take 1 tablet by mouth daily.  90 tablet  3   No current facility-administered medications on file prior to visit.   Allergies  Allergen Reactions  . Penicillins Rash    Review of Systems  Constitutional: Negative for weight loss and malaise/fatigue.  Eyes: Negative for blurred vision.  Respiratory: Positive for cough. Negative for shortness of breath.   Cardiovascular: Negative for chest pain and leg swelling.  Gastrointestinal: Negative for nausea, vomiting, abdominal pain, diarrhea and constipation.  Genitourinary: Negative for frequency.  Neurological: Negative for tingling and sensory change.   Filed Vitals:   09/24/13 1410  BP: 122/60  Pulse: 77   Wt Readings from Last 3 Encounters:  09/24/13 190 lb 8 oz (86.41 kg)  09/03/13 191 lb 8 oz (86.864 kg)  08/13/13 187 lb 12 oz (85.163 kg)   Physical Exam  Vitals reviewed. Constitutional: Marissa Huff appears well-developed and well-nourished. No distress.  Remainder exam deferred    Assessment and Plan:  Problem List Items Addressed This Visit     Cardiovascular and Mediastinum   Hypertension     BP well controlled at this visit. Continue current medications.  Last urine MA negative May 2015.       Endocrine   Type II or unspecified type diabetes mellitus without mention of complication, uncontrolled - Primary (Chronic)     Last A1c was not far from target elevated. Patient having elevated sugars due to recent steroid injections in her back as well. Goal sugars could be little higher between 110-180.  Discussed about adding GLP-1  agent back up versus try to control on basal/bolus regimen.  Marissa Huff would like to try the Victoza again. Aware of side effects and risk profile. Start at 0.6 mg [place] daily and increase to 1.2 mg [place] daily. Continue Lantus at 38 units daily at bedtime.  Change Humalog to 14+1 scale for BF, 18+1 lunch and 18+1 supper. Night time corrections of elevated sugars to be done with Humalog if sugars are > 350.  Detailed chart given to patient and included in instructions.  Marissa Huff will report back if Marissa Huff starts seeing lows again, expect that to  happen in about 1 week once the effect of steroids wears off.  Marissa Huff will continue to take Humalog right after the meal based on premeal sugars.   RTC 6 weeks.       Relevant Medications      insulin glargine (LANTUS) 100 UNIT/ML injection      Liraglutide 18 MG/3ML SOPN      insulin lispro (HUMALOG KWIKPEN) 100 UNIT/ML KiwkPen      25 minutes spent with the patient, > 50% time spent on coordination of care and patient counselling on topics mentioned above.

## 2013-09-25 ENCOUNTER — Encounter: Payer: Self-pay | Admitting: Internal Medicine

## 2013-09-27 NOTE — Telephone Encounter (Signed)
Mailed unread message to pt  

## 2013-10-21 ENCOUNTER — Telehealth: Payer: Self-pay | Admitting: Internal Medicine

## 2013-10-21 NOTE — Telephone Encounter (Signed)
Ms. Marissa Huff called saying since Dr. Howell Rucks prescribed the new "pen" for her to use for her medication, she's been having low blood sugar. She said it's been lower for at least a week now. She's wondering if she can come in sooner than her scheduled appt. Please call the patient. Pt ph# [phone] Thank you.

## 2013-10-21 NOTE — Telephone Encounter (Signed)
pts appoint rescheduled as requested

## 2013-10-23 ENCOUNTER — Encounter: Payer: Self-pay | Admitting: Endocrinology

## 2013-10-23 ENCOUNTER — Ambulatory Visit (INDEPENDENT_AMBULATORY_CARE_PROVIDER_SITE_OTHER): Payer: Commercial Managed Care - HMO | Admitting: Endocrinology

## 2013-10-23 VITALS — BP 116/70 | HR 78 | Wt 190.2 lb

## 2013-10-23 DIAGNOSIS — I10 Essential (primary) hypertension: Secondary | ICD-10-CM

## 2013-10-23 DIAGNOSIS — N1831 Chronic kidney disease, stage 3a: Secondary | ICD-10-CM | POA: Insufficient documentation

## 2013-10-23 DIAGNOSIS — E1122 Type 2 diabetes mellitus with diabetic chronic kidney disease: Secondary | ICD-10-CM

## 2013-10-23 DIAGNOSIS — E1165 Type 2 diabetes mellitus with hyperglycemia: Secondary | ICD-10-CM

## 2013-10-23 DIAGNOSIS — N182 Chronic kidney disease, stage 2 (mild): Secondary | ICD-10-CM | POA: Insufficient documentation

## 2013-10-23 DIAGNOSIS — IMO0002 Reserved for concepts with insufficient information to code with codable children: Secondary | ICD-10-CM

## 2013-10-23 MED ORDER — INSULIN LISPRO 100 UNIT/ML (KWIKPEN): PEN_INJECTOR | SUBCUTANEOUS | Status: DC

## 2013-10-23 NOTE — Assessment & Plan Note (Signed)
Early to update A1c. Will do so at next visit in 1 month.  She has responded well to Victoza and is tolerating it well. Having hypoglycemia at various times of the day, but mainly at night.  Treating lows appropriately.   She will continue to check sugars 4 x daily.  Adjust Lantus to 35 units daily at bedtime. Adjusted Novolog to 14+1 BF/16+1 Lunch/14+1 supper ( detailed chart in pt instructions).  Move victoza 1.8 mg daily to post Breakfast.  Notify me if she were to continue having lows, as she may need a further decrease in her insulin doses as Victoza continues to work.  RTC 1 month

## 2013-10-23 NOTE — Assessment & Plan Note (Signed)
BP well controlled at this visit. Continue current medications.  Last urine MA negative May 2015.        

## 2013-10-23 NOTE — Progress Notes (Signed)
Reason for visit- Anniebell Bedore is a 80 y.o. female  who is accompanied by her husband and here for follow up of  type 2 Diabetes Mellitus, uncontrolled, with complications (Stage 2 CKD).   HPI-  Last seen here about 4 weeks ago. Diagnosed with DM around 1970s, now on basal/bolus regimen with Lantus/Humalog. Was on GLP-1 before, but taken off as thought to be behaving like LADA versus ran out of medication around Spring/Summer 2015. Was on Victoza in the past, and reports tolerating it well. Trulicity not covered under her plan and was never filled. Has not tolerated Metformin in the past due to diarrhea.  Restarted Victoza 09/2013  Currently, she is on - - lantus 38 units at bedtime.  - Humalog scale after each meal ( taking this way to cut back on hypoglycemia) 14+1 BF, 18+1 lunch and 18+1 supper -Victoza 1.8 mg [place] daily ( 09/2013)- take post lunch Has been following scale as instructed.  Lab Results  Component Value Date   HGBA1C 9.1 Repeated and verified X2.* 08/13/2013   HGBA1C 7.9* 04/24/2013   HGBA1C 7.4* 01/09/2013     She has been monitoring her diet and trying to control her carbohydrates. Exercise and physical activity is limited due to back pain. No more steroid shots in her back since last time.   She brings her BS log and checks her sugars 4 times daily pre meals. Starting to note hypoglycemia mainly at bedtime, also some during the daytime.     PREMEAL Breakfast Lunch Dinner Bedtime Overall  Glucose range: 121-156 67-225 104-254 37-238   Mean/median:        Hypoglycemia- Lot of low readings at bedtime, which sometimes wake her up from her sleep.  Correctly appropriately. Not needed assistance recently for this.  Some day time lows as well, possibly related to inadequate food intake.  Has had modified symptoms of hypoglycemia awareness since her diagnosis, now just feels unwell and doesn't get the typical symptoms.    Nephropathy- Yes, Stage 2 CKD, last BUN/Cr  were-   Lab Results  Component Value Date   BUN 20 09/19/2013   Lab Results  Component Value Date   CREATININE 0.9 09/19/2013   GFR 64.78 09/19/2013     Retinopathy- No, Last DEE was august 2015 No known neuropathy. Denies any tingling/numbness in her feet No prior CAD. No hx stroke. Has hypothyroidism that is being treated with Synthroid.  Last TSH was - Lab Results  Component Value Date   TSH 2.11 05/17/2013    Lipids- on dietary therapy. Lab Results  Component Value Date   CHOL 129 09/19/2013   HDL 35.20* 09/19/2013   LDLCALC 79 09/19/2013   TRIG 72.0 09/19/2013   CHOLHDL 4 09/19/2013      Hypertension- She  is on Diovan HCT and tolerating well. BP controlled at this visit.   I have reviewed her past medical history, medications and allergies.   Current Outpatient Prescriptions on File Prior to Visit  Medication Sig Dispense Refill  . acetaminophen (TYLENOL) 500 MG tablet Take 500 mg by mouth every 6 (six) hours as needed.      Marland Kitchen albuterol (PROVENTIL HFA;VENTOLIN HFA) 108 (90 BASE) MCG/ACT inhaler Inhale 2 puffs into the lungs every 6 (six) hours as needed for wheezing or shortness of breath.  1 Inhaler  1  . amLODipine (NORVASC) 5 MG tablet Take 1 tablet (5 mg total) by mouth daily.  90 tablet  3  . Cholecalciferol (VITAMIN D-3  PO) Take 1 capsule by mouth daily.      Marland Kitchen DOXYLAMINE SUCCINATE, SLEEP, PO Take by mouth.      . fexofenadine (ALLEGRA) 180 MG tablet Take 180 mg by mouth daily.      . fluticasone (FLONASE) 50 MCG/ACT nasal spray Place 2 sprays into both nostrils daily.  48 g  3  . glucagon (GLUCAGON EMERGENCY) 1 MG injection Inject 1 mg into the muscle once as needed.  1 each  12  . glucose blood (ONE TOUCH ULTRA TEST) test strip Use as instructed four times daily  400 each  2  . imipramine (TOFRANIL) 25 MG tablet Take 25 mg by mouth at bedtime.      . insulin glargine (LANTUS) 100 UNIT/ML injection Inject 38 Units into the skin at bedtime. Pens please  - SOLOSTAR       . Liraglutide 18 MG/3ML SOPN Inject 0.6 mg [place] daily and increase in 1-2 weeks to 1.2 mg [place] daily  3 pen  3  . LORazepam (ATIVAN) 0.5 MG tablet 1/2 tablet q day prn  30 tablet  0  . Multiple Vitamins-Minerals (MULTIVITAMIN PO) Take by mouth daily.       . Multiple Vitamins-Minerals (PRESERVISION AREDS PO) Take by mouth 2 (two) times daily.      . pantoprazole (PROTONIX) 40 MG tablet Take 1 tablet (40 mg total) by mouth 2 (two) times daily.  180 tablet  1  . SYNTHROID 175 MCG tablet TAKE 1 TABLET (175 MCG TOTAL) BY MOUTH DAILY BEFORE BREAKFAST.  30 tablet  11  . traMADol (ULTRAM) 50 MG tablet 1 to 2 tabs every 4 to six hours      . valsartan-hydrochlorothiazide (DIOVAN-HCT) 160-12.5 MG per tablet Take 1 tablet by mouth daily.  90 tablet  3   No current facility-administered medications on file prior to visit.   Allergies  Allergen Reactions  . Penicillins Rash    Review of Systems- [ x ]  Complains of    [  ]  denies [  ] Recent weight change [  ]  Fatigue [  ] polydipsia [  ] polyuria [  ]  nocturia [  ]  vision difficulty [  ] chest pain [  ] shortness of breath [  ] leg swelling [  ] cough [  ] nausea/vomiting [  ] diarrhea [  ] constipation [  ] abdominal pain [  ]  tingling/numbness in extremities [  ]  concern with feet ( wounds/sores)   Filed Vitals:   10/23/13 0956  BP: 116/70  Pulse: 78   Wt Readings from Last 3 Encounters:  10/23/13 190 lb 4 oz (86.297 kg)  09/24/13 190 lb 8 oz (86.41 kg)  09/03/13 191 lb 8 oz (86.864 kg)    Physical Exam   Exam: deferred   Assessment and Plan:  1. Type 2 Diabetes  2. Stage 2 CKD 3. Diabetic Hypoglycemia 4. HTN  Problem List Items Addressed This Visit     Cardiovascular and Mediastinum   Hypertension     BP well controlled at this visit. Continue current medications.  Last urine MA negative May 2015.         Genitourinary   CKD stage 2 due to type 2 diabetes mellitus     Checked recently and stable.      Relevant Medications      insulin lispro (HUMALOG KWIKPEN) 100 UNIT/ML KiwkPen     Other   Type  2 diabetes mellitus, uncontrolled - Primary      Early to update A1c. Will do so at next visit in 1 month.  She has responded well to Victoza and is tolerating it well. Having hypoglycemia at various times of the day, but mainly at night.  Treating lows appropriately.   She will continue to check sugars 4 x daily.  Adjust Lantus to 35 units daily at bedtime. Adjusted Novolog to 14+1 BF/16+1 Lunch/14+1 supper ( detailed chart in pt instructions).  Move victoza 1.8 mg daily to post Breakfast.  Notify me if she were to continue having lows, as she may need a further decrease in her insulin doses as Victoza continues to work.  RTC 1 month    Relevant Medications      insulin lispro (HUMALOG KWIKPEN) 100 UNIT/ML KiwkPen       -RTC 1 month with meter/log  [facility] 10/23/2013 10:58 AM

## 2013-10-23 NOTE — Assessment & Plan Note (Signed)
Checked recently and stable.

## 2013-10-23 NOTE — Progress Notes (Signed)
Pre visit review using our clinic review tool, if applicable. No additional management support is needed unless otherwise documented below in the visit note. 

## 2013-10-23 NOTE — Patient Instructions (Signed)
Check sugars 4 x daily ( before each meal and at bedtime).  Record them in a log book and bring that/meter to next appointment.   Decrease lantus to 35 units at bedtime.  Continue current Victoza. Take it between BF and lunch.  Adjust Novolog as scale below-    Blood Glucose (mg/dL)  Breakfast  (Units Novolog Insulin)  Lunch  (Units Novolog Insulin)  Supper  (Units Novolog Insulin)  Nighttime (Units Novolog Insulin)   <70 Treat the low blood sugar.  Recheck blood glucose  in 15 mins. If sugar is more than 70, then take the number of units of insulin in the 70-90 row, if before a meal.   70-90   8  10   8   0  91-130 (Base Dose)  14  16  14   0   131-150  15  17  15   0   151-200  16  18  16   0   201-250  17  19  17  0   251-300  18  20  18 1    301-350  19  21  19 2    351-400  20  22  20  3    401-450  21  23  21 4    >450  22  24  22  5    Lantus insulin 35 units subcutaneous Nightly     Please come back for a follow-up appointment in 1 month.

## 2013-11-05 ENCOUNTER — Ambulatory Visit: Payer: Commercial Managed Care - HMO | Admitting: Endocrinology

## 2013-11-26 ENCOUNTER — Encounter: Payer: Self-pay | Admitting: Endocrinology

## 2013-11-26 ENCOUNTER — Ambulatory Visit (INDEPENDENT_AMBULATORY_CARE_PROVIDER_SITE_OTHER): Payer: Commercial Managed Care - HMO | Admitting: Endocrinology

## 2013-11-26 VITALS — BP 122/58 | HR 86 | Wt 187.5 lb

## 2013-11-26 DIAGNOSIS — I10 Essential (primary) hypertension: Secondary | ICD-10-CM

## 2013-11-26 DIAGNOSIS — E1165 Type 2 diabetes mellitus with hyperglycemia: Secondary | ICD-10-CM

## 2013-11-26 DIAGNOSIS — IMO0002 Reserved for concepts with insufficient information to code with codable children: Secondary | ICD-10-CM

## 2013-11-26 DIAGNOSIS — N182 Chronic kidney disease, stage 2 (mild): Secondary | ICD-10-CM

## 2013-11-26 DIAGNOSIS — E1122 Type 2 diabetes mellitus with diabetic chronic kidney disease: Secondary | ICD-10-CM

## 2013-11-26 LAB — HEMOGLOBIN A1C: Hgb A1c MFr Bld: 7.2 % — ABNORMAL HIGH (ref 4.6–6.5)

## 2013-11-26 NOTE — Progress Notes (Signed)
Reason for visit- Marissa Huff is a 80 y.o. female  who is accompanied by her husband and here for follow up of  type 2 Diabetes Mellitus, uncontrolled, with complications (Stage 2 CKD).   HPI-  Last seen here about 4 weeks ago. Diagnosed with DM around 1970s, now on basal/bolus regimen with Lantus/Humalog. Was on GLP-1 before, but taken off as thought to be behaving like LADA versus ran out of medication around Spring/Summer 2015. Was on Victoza in the past, and reports tolerating it well. Trulicity not covered under her plan and was never filled. Has not tolerated Metformin in the past due to diarrhea.  Restarted Victoza 09/2013  Currently, she is on - - lantus 35 units at bedtime.  - Humalog scale after each meal ( taking this way to cut back on hypoglycemia) 14+1 BF, 16+1 lunch and 14+1 supper -Victoza 1.8 mg [place] daily ( 09/2013)- takes post Bf Has been following scale as instructed.  Lab Results  Component Value Date   HGBA1C 7.2* 11/26/2013   HGBA1C 9.1 Repeated and verified X2.* 08/13/2013   HGBA1C 7.9* 04/24/2013     She has been monitoring her diet and trying to control her carbohydrates. Exercise and physical activity is limited due to back pain. No more steroid shots in her back since last time.   She brings her BS log and checks her sugars 4 times daily pre meals. Starting to note hypoglycemia throughout the day and also in the evening now.     PREMEAL Breakfast Lunch Dinner Bedtime Overall  Glucose range: 152-261 68-252 54-273 50-266   Mean/median:        Hypoglycemia- Lot of low readings at evening and closer to bedtime.  Correctly appropriately. Not needed assistance recently for this.  Some day time lows as well, possibly related to inadequate food intake.  Has had modified symptoms of hypoglycemia awareness since her diagnosis, now just feels unwell and doesn't get the typical symptoms.    Nephropathy- Yes, Stage 2 CKD, last BUN/Cr were-   Lab Results   Component Value Date   BUN 20 09/19/2013   Lab Results  Component Value Date   CREATININE 0.9 09/19/2013   GFR 64.78 09/19/2013     Retinopathy- No, Last DEE was august 2015 No known neuropathy. Denies any tingling/numbness in her feet No prior CAD. No hx stroke. Has hypothyroidism that is being treated with Synthroid.  Last TSH was - Lab Results  Component Value Date   TSH 2.11 05/17/2013    Lipids- on dietary therapy. Lab Results  Component Value Date   CHOL 129 09/19/2013   HDL 35.20* 09/19/2013   LDLCALC 79 09/19/2013   TRIG 72.0 09/19/2013   CHOLHDL 4 09/19/2013      Hypertension- She  is on Diovan HCT and tolerating well. BP controlled at this visit.   I have reviewed her past medical history, medications and allergies.   Current Outpatient Prescriptions on File Prior to Visit  Medication Sig Dispense Refill  . acetaminophen (TYLENOL) 500 MG tablet Take 500 mg by mouth every 6 (six) hours as needed.    Marland Kitchen albuterol (PROVENTIL HFA;VENTOLIN HFA) 108 (90 BASE) MCG/ACT inhaler Inhale 2 puffs into the lungs every 6 (six) hours as needed for wheezing or shortness of breath. 1 Inhaler 1  . amLODipine (NORVASC) 5 MG tablet Take 1 tablet (5 mg total) by mouth daily. 90 tablet 3  . Cholecalciferol (VITAMIN D-3 PO) Take 1 capsule by mouth daily.    Marland Kitchen  DOXYLAMINE SUCCINATE, SLEEP, PO Take by mouth.    . fexofenadine (ALLEGRA) 180 MG tablet Take 180 mg by mouth daily.    . fluticasone (FLONASE) 50 MCG/ACT nasal spray Place 2 sprays into both nostrils daily. 48 g 3  . glucagon (GLUCAGON EMERGENCY) 1 MG injection Inject 1 mg into the muscle once as needed. 1 each 12  . glucose blood (ONE TOUCH ULTRA TEST) test strip Use as instructed four times daily 400 each 2  . imipramine (TOFRANIL) 25 MG tablet Take 25 mg by mouth at bedtime.    . insulin glargine (LANTUS) 100 UNIT/ML injection Inject 38 Units into the skin at bedtime. Pens please  - SOLOSTAR    . insulin lispro (HUMALOG  KWIKPEN) 100 UNIT/ML KiwkPen Inject on a sliding scale between 14-28 units with each meal. Max daily dose 90 90 mL 3  . Liraglutide 18 MG/3ML SOPN Inject 0.6 mg [place] daily and increase in 1-2 weeks to 1.2 mg [place] daily 3 pen 3  . LORazepam (ATIVAN) 0.5 MG tablet 1/2 tablet q day prn 30 tablet 0  . Multiple Vitamins-Minerals (MULTIVITAMIN PO) Take by mouth daily.     . Multiple Vitamins-Minerals (PRESERVISION AREDS PO) Take by mouth 2 (two) times daily.    . pantoprazole (PROTONIX) 40 MG tablet Take 1 tablet (40 mg total) by mouth 2 (two) times daily. 180 tablet 1  . SYNTHROID 175 MCG tablet TAKE 1 TABLET (175 MCG TOTAL) BY MOUTH DAILY BEFORE BREAKFAST. 30 tablet 11  . traMADol (ULTRAM) 50 MG tablet 1 to 2 tabs every 4 to six hours    . valsartan-hydrochlorothiazide (DIOVAN-HCT) 160-12.5 MG per tablet Take 1 tablet by mouth daily. 90 tablet 3   No current facility-administered medications on file prior to visit.   Allergies  Allergen Reactions  . Penicillins Rash    Review of Systems- [ x ]  Complains of    [  ]  denies [  ] Recent weight change [  ]  Fatigue [  ] polydipsia [  ] polyuria [  ]  nocturia [  ]  vision difficulty [  ] chest pain [  ] shortness of breath [  ] leg swelling [  ] cough [  ] nausea/vomiting [  ] diarrhea [  ] constipation [  ] abdominal pain [  ]  tingling/numbness in extremities [  ]  concern with feet ( wounds/sores)   Filed Vitals:   11/26/13 1326  BP: 122/58  Pulse: 86   Wt Readings from Last 3 Encounters:  11/26/13 187 lb 8 oz (85.049 kg)  10/23/13 190 lb 4 oz (86.297 kg)  09/24/13 190 lb 8 oz (86.41 kg)    Physical Exam   Exam: deferred   Assessment and Plan:  1. Type 2 Diabetes  2. Stage 2 CKD 3. Diabetic Hypoglycemia 4. HTN  Problem List Items Addressed This Visit      Cardiovascular and Mediastinum   Hypertension    BP well controlled at this visit. Continue current medications.  Last urine MA negative May 2015.            Genitourinary   CKD stage 2 due to type 2 diabetes mellitus    Checked recently and stable.         Other   Type 2 diabetes mellitus, uncontrolled - Primary    Update A1c today. Meter avg has come down.  She has responded well to Victoza and is tolerating it well.  Having hypoglycemia at various times of the day, but mainly at pm and night.  Treating lows appropriately.   She will continue to check sugars 4 x daily.  Adjust Lantus to 33 units daily at bedtime, inspite of the the morning higher readings as there is sporadic hypoglycemia through the day. Adjusted Humalog to 14+1 BF/16+1 Lunch/15+1 supper ( detailed chart in pt instructions).  Continue victoza 1.8 mg daily to post Breakfast.  We discussed about trying to take the Humalog right at the start of the meal rather than 10 minutes after the meal to avoid hypoglycemia.  Notify me if she were to continue having lows, as she may need a further decrease in her insulin doses as Victoza continues to work.  RTC 3 months      Relevant Orders      Hemoglobin A1c (Completed)       -RTC 3 month with meter/log  [facility] 11/27/2013 10:32 AM

## 2013-11-26 NOTE — Patient Instructions (Signed)
Change lantus to 33 units daily.  Continue Victoza.  Update labs today. Avoid aspartame.  Take Humalog after checking your sugars and right as you are ready to eat a meal rather than after a meal. Please come back for a follow-up appointment in 3 months    Blood Glucose (mg/dL)  Breakfast  (Units Novolog Insulin)  Lunch  (Units Novolog Insulin)  Supper  (Units Novolog Insulin)  Nighttime (Units Novolog Insulin)   <70 Treat the low blood sugar.  Recheck blood glucose  in 15 mins. If sugar is more than 70, then take the number of units of insulin in the 70-90 row, if before a meal.   70-90   8  10   9   0  91-130 (Base Dose)  14  16  15   0   131-150  15  17  16   0   151-200  16  18  17   0   201-250  17  19  18  0   251-300  18  20  19 1    301-350  19  21  20 2    351-400  20  22  21  3    401-450  21  23  22 4    >450  22  24  23  5    Lantus insulin 33 units subcutaneous Nightly

## 2013-11-26 NOTE — Progress Notes (Signed)
Pre visit review using our clinic review tool, if applicable. No additional management support is needed unless otherwise documented below in the visit note. 

## 2013-11-27 ENCOUNTER — Encounter: Payer: Self-pay | Admitting: *Deleted

## 2013-11-27 NOTE — Assessment & Plan Note (Signed)
BP well controlled at this visit. Continue current medications.  Last urine MA negative May 2015.        

## 2013-11-27 NOTE — Assessment & Plan Note (Signed)
Checked recently and stable.

## 2013-11-27 NOTE — Assessment & Plan Note (Signed)
Update A1c today. Meter avg has come down.  She has responded well to Victoza and is tolerating it well. Having hypoglycemia at various times of the day, but mainly at pm and night.  Treating lows appropriately.   She will continue to check sugars 4 x daily.  Adjust Lantus to 33 units daily at bedtime, inspite of the the morning higher readings as there is sporadic hypoglycemia through the day. Adjusted Humalog to 14+1 BF/16+1 Lunch/15+1 supper ( detailed chart in pt instructions).  Continue victoza 1.8 mg daily to post Breakfast.  We discussed about trying to take the Humalog right at the start of the meal rather than 10 minutes after the meal to avoid hypoglycemia.  Notify me if she were to continue having lows, as she may need a further decrease in her insulin doses as Victoza continues to work.  RTC 3 months

## 2014-01-06 ENCOUNTER — Encounter: Payer: Self-pay | Admitting: Internal Medicine

## 2014-01-06 ENCOUNTER — Ambulatory Visit (INDEPENDENT_AMBULATORY_CARE_PROVIDER_SITE_OTHER): Payer: Commercial Managed Care - HMO | Admitting: Internal Medicine

## 2014-01-06 VITALS — BP 120/50 | HR 76 | Temp 98.0°F | Ht 63.5 in | Wt 185.5 lb

## 2014-01-06 DIAGNOSIS — M4800 Spinal stenosis, site unspecified: Secondary | ICD-10-CM

## 2014-01-06 DIAGNOSIS — K3184 Gastroparesis: Secondary | ICD-10-CM

## 2014-01-06 DIAGNOSIS — N182 Chronic kidney disease, stage 2 (mild): Secondary | ICD-10-CM

## 2014-01-06 DIAGNOSIS — E1122 Type 2 diabetes mellitus with diabetic chronic kidney disease: Secondary | ICD-10-CM

## 2014-01-06 DIAGNOSIS — E7800 Pure hypercholesterolemia, unspecified: Secondary | ICD-10-CM

## 2014-01-06 DIAGNOSIS — I10 Essential (primary) hypertension: Secondary | ICD-10-CM

## 2014-01-06 DIAGNOSIS — E1165 Type 2 diabetes mellitus with hyperglycemia: Secondary | ICD-10-CM

## 2014-01-06 DIAGNOSIS — Z91048 Other nonmedicinal substance allergy status: Secondary | ICD-10-CM

## 2014-01-06 DIAGNOSIS — E669 Obesity, unspecified: Secondary | ICD-10-CM

## 2014-01-06 DIAGNOSIS — K219 Gastro-esophageal reflux disease without esophagitis: Secondary | ICD-10-CM

## 2014-01-06 DIAGNOSIS — Z9109 Other allergy status, other than to drugs and biological substances: Secondary | ICD-10-CM

## 2014-01-06 DIAGNOSIS — E039 Hypothyroidism, unspecified: Secondary | ICD-10-CM

## 2014-01-06 DIAGNOSIS — IMO0002 Reserved for concepts with insufficient information to code with codable children: Secondary | ICD-10-CM

## 2014-01-06 DIAGNOSIS — K279 Peptic ulcer, site unspecified, unspecified as acute or chronic, without hemorrhage or perforation: Secondary | ICD-10-CM

## 2014-01-06 DIAGNOSIS — R6889 Other general symptoms and signs: Secondary | ICD-10-CM

## 2014-01-06 MED ORDER — PANTOPRAZOLE SODIUM 40 MG PO TBEC: 40.0000 mg | DELAYED_RELEASE_TABLET | Freq: Two times a day (BID) | ORAL | Status: DC

## 2014-01-06 MED ORDER — AMLODIPINE BESYLATE 5 MG PO TABS: 5.0000 mg | ORAL_TABLET | Freq: Every day | ORAL | Status: DC

## 2014-01-06 NOTE — Patient Instructions (Signed)
Saline nasal spray - flush nose at least 2-3x/day 

## 2014-01-06 NOTE — Progress Notes (Signed)
Pre visit review using our clinic review tool, if applicable. No additional management support is needed unless otherwise documented below in the visit note. 

## 2014-01-06 NOTE — Progress Notes (Signed)
Subjective:    Patient ID: Marissa Huff, female    DOB: 1931/12/20, 81 y.o.   MRN: [id number]  HPI 81 year old female with past history of hypertension, diabetes, and hypothyroidism who comes in today for a scheduled follow up.  Trying to watch what she eats.  Seeing endocrinology.  They have adjusted her medications.  See notes for details.  We have discussed the importance of eating regular meals and snacks and not going a long period without eating.  States am sugars averaging 120-160 and pm sugar 200.   No chest pain or tightness.  No increased sob.   No headache or dizziness.  Does report some increased drainage.  Some mucus in her throat.  Back pain - better/stable.  Saw Dr Pat Patrick in [place].  Has tramadol to take prn. Does not want surgery.  Went to [facility] pain [facility] and had an epidural.  Did not help.  Now seeing Dr Sharlet Salina.   Taking tramadol and muscle relaxer.  Stable.  Takes Protonix bid.  Acid reflux controlled.  She is being evaluated by Dr Jacqlyn Larsen. Had a CT.  States had an abnormal kidney.  Had stones.  Is s/p cystoscopy.   Currently without urinary symptoms.  No abdominal pain.  Bowels appear to be stable.     Past Medical History  Diagnosis Date  . Diabetes mellitus without complication   . Hypothyroidism   . GERD (gastroesophageal reflux disease)   . Hypertension   . Gastric lymphoma   . Allergy   . Spinal stenosis     lumbar laminectomy  . PUD (peptic ulcer disease)   . Gastroparesis   . Diverticulosis   . Carpal tunnel syndrome, bilateral     Current Outpatient Prescriptions on File Prior to Visit  Medication Sig Dispense Refill  . acetaminophen (TYLENOL) 500 MG tablet Take 500 mg by mouth every 6 (six) hours as needed.    Marland Kitchen albuterol (PROVENTIL HFA;VENTOLIN HFA) 108 (90 BASE) MCG/ACT inhaler Inhale 2 puffs into the lungs every 6 (six) hours as needed for wheezing or shortness of breath. 1 Inhaler 1  . amLODipine (NORVASC) 5 MG tablet Take 1 tablet (5 mg total) by mouth  daily. 90 tablet 3  . Cholecalciferol (VITAMIN D-3 PO) Take 1 capsule by mouth daily.    Marland Kitchen DOXYLAMINE SUCCINATE, SLEEP, PO Take by mouth.    . fexofenadine (ALLEGRA) 180 MG tablet Take 180 mg by mouth daily.    . fluticasone (FLONASE) 50 MCG/ACT nasal spray Place 2 sprays into both nostrils daily. 48 g 3  . glucagon (GLUCAGON EMERGENCY) 1 MG injection Inject 1 mg into the muscle once as needed. 1 each 12  . glucose blood (ONE TOUCH ULTRA TEST) test strip Use as instructed four times daily 400 each 2  . imipramine (TOFRANIL) 25 MG tablet Take 25 mg by mouth at bedtime.    . insulin glargine (LANTUS) 100 UNIT/ML injection Inject 38 Units into the skin at bedtime. Pens please  - SOLOSTAR    . insulin lispro (HUMALOG KWIKPEN) 100 UNIT/ML KiwkPen Inject on a sliding scale between 14-28 units with each meal. Max daily dose 90 90 mL 3  . Liraglutide 18 MG/3ML SOPN Inject 0.6 mg [place] daily and increase in 1-2 weeks to 1.2 mg [place] daily 3 pen 3  . LORazepam (ATIVAN) 0.5 MG tablet 1/2 tablet q day prn 30 tablet 0  . Multiple Vitamins-Minerals (MULTIVITAMIN PO) Take by mouth daily.     . Multiple  Vitamins-Minerals (PRESERVISION AREDS PO) Take by mouth 2 (two) times daily.    . pantoprazole (PROTONIX) 40 MG tablet Take 1 tablet (40 mg total) by mouth 2 (two) times daily. 180 tablet 1  . SYNTHROID 175 MCG tablet TAKE 1 TABLET (175 MCG TOTAL) BY MOUTH DAILY BEFORE BREAKFAST. 30 tablet 11  . traMADol (ULTRAM) 50 MG tablet 1 to 2 tabs every 4 to six hours    . valsartan-hydrochlorothiazide (DIOVAN-HCT) 160-12.5 MG per tablet Take 1 tablet by mouth daily. 90 tablet 3   No current facility-administered medications on file prior to visit.    Review of Systems Patient denies any headache, lightheadedness or dizziness.  Some mucus in her throat and drainage as outlined.  Started improving a few days ago.   No chest pain, tightness or palpitations.  No increased shortness of breath.   Breathing stable.   No nausea or  vomiting.  On protonix.  No abdominal pain or cramping.  Bowels stable.  No urine change.   Back pain as outlined.   Seeing Dr Sharlet Salina.  Seeing endocrinology.  Sugars as outlined.         Objective:   Physical Exam  Filed Vitals:   01/06/14 1442  BP: 120/50  Pulse: 76  Temp: 98 F (36.7 C)   Blood pressure recheck: 68/16  81 year old female in no acute distress.   HEENT:  Nares- clear.  Oropharynx - without lesions. NECK:  Supple.  Nontender.  No audible bruit.  HEART:  Appears to be regular. LUNGS:  No crackles or wheezing audible.  Respirations even and unlabored.  RADIAL PULSE:  Equal bilaterally.  ABDOMEN:  Soft, nontender.  Bowel sounds present and normal.  No audible abdominal bruit.   EXTREMITIES:  No increased edema present.  DP pulses palpable and equal bilaterally.    FEET:   without lesions.        Assessment & Plan:  1. Environmental allergies Persistent symptoms as outlined.  On allegra and steroid nasal spray.  Given persistent symptoms, refer to ENT for evaluation.   - Ambulatory referral to ENT  2. Throat congestion See above.  Persistent despite antihistamine, steroid nasal spray and protonix.   - Ambulatory referral to ENT  3. Obesity (BMI 30-39.9) Diet and exercise.    4. Essential hypertension Blood pressure well controlled.  Same medication regimen.   - Basic metabolic panel; Future  5. Peptic ulcer disease Stable on protonix.    6. Gastroparesis Sees GI.  Stable.    7. Hypothyroidism, unspecified hypothyroidism type On thyroid replacement.  Follow tsh.    8. Spinal stenosis, unspecified spinal region Stable.  Seeing Dr Sharlet Salina.    9. Hypercholesterolemia Low cholesterol diet and exercise.  Follow lipid panel and liver function tests.   - Hepatic function panel; Future - Lipid panel; Future  10. Type 2 diabetes mellitus, uncontrolled Seeing Dr Eddie Dibbles.  Sugars as outlined.  States up to date with eye checks.    11. Gastroesophageal  reflux disease, esophagitis presence not specified On protonix.  No acid.  Does report the mucus in her throat.  Treating allergies.  Refer to ENT.    12. BACK PAIN.  Persistent.  Seeing Dr Sharlet Salina.     13. INCREASED STRESS.  Not a major issue.  Stable.     HEALTH MAINTENANCE.  Physical 05/03/13.   Mammogram 07/08/13 - BiRADS I.  Has been five  Years since last colonoscopy.  With the bowel issues and changes, was  referred back to GI.  See Dr Myrna Blazer note for details.

## 2014-01-11 ENCOUNTER — Encounter: Payer: Self-pay | Admitting: Internal Medicine

## 2014-01-11 DIAGNOSIS — E669 Obesity, unspecified: Secondary | ICD-10-CM | POA: Insufficient documentation

## 2014-01-11 DIAGNOSIS — Z9109 Other allergy status, other than to drugs and biological substances: Secondary | ICD-10-CM | POA: Insufficient documentation

## 2014-01-11 DIAGNOSIS — K219 Gastro-esophageal reflux disease without esophagitis: Secondary | ICD-10-CM | POA: Insufficient documentation

## 2014-01-28 ENCOUNTER — Encounter: Payer: Self-pay | Admitting: Internal Medicine

## 2014-02-03 ENCOUNTER — Other Ambulatory Visit: Payer: Self-pay | Admitting: *Deleted

## 2014-02-03 DIAGNOSIS — E1165 Type 2 diabetes mellitus with hyperglycemia: Secondary | ICD-10-CM

## 2014-02-03 DIAGNOSIS — IMO0002 Reserved for concepts with insufficient information to code with codable children: Secondary | ICD-10-CM

## 2014-02-03 MED ORDER — LIRAGLUTIDE 18 MG/3ML ~~LOC~~ SOPN: PEN_INJECTOR | SUBCUTANEOUS | Status: DC

## 2014-02-26 ENCOUNTER — Encounter: Payer: Self-pay | Admitting: Endocrinology

## 2014-02-26 ENCOUNTER — Ambulatory Visit (INDEPENDENT_AMBULATORY_CARE_PROVIDER_SITE_OTHER): Payer: Commercial Managed Care - HMO | Admitting: Endocrinology

## 2014-02-26 VITALS — BP 116/54 | HR 80 | Resp 16 | Ht 63.5 in | Wt 185.5 lb

## 2014-02-26 DIAGNOSIS — I10 Essential (primary) hypertension: Secondary | ICD-10-CM

## 2014-02-26 DIAGNOSIS — E1165 Type 2 diabetes mellitus with hyperglycemia: Secondary | ICD-10-CM

## 2014-02-26 DIAGNOSIS — N182 Chronic kidney disease, stage 2 (mild): Secondary | ICD-10-CM

## 2014-02-26 DIAGNOSIS — E7800 Pure hypercholesterolemia, unspecified: Secondary | ICD-10-CM

## 2014-02-26 DIAGNOSIS — IMO0002 Reserved for concepts with insufficient information to code with codable children: Secondary | ICD-10-CM

## 2014-02-26 DIAGNOSIS — E669 Obesity, unspecified: Secondary | ICD-10-CM

## 2014-02-26 DIAGNOSIS — E1122 Type 2 diabetes mellitus with diabetic chronic kidney disease: Secondary | ICD-10-CM

## 2014-02-26 LAB — POCT GLUCOSE (DEVICE FOR HOME USE)

## 2014-02-26 MED ORDER — LIRAGLUTIDE 18 MG/3ML ~~LOC~~ SOPN: PEN_INJECTOR | SUBCUTANEOUS | Status: DC

## 2014-02-26 NOTE — Assessment & Plan Note (Signed)
Checked recently and stable.  Due for repeat levels next week.

## 2014-02-26 NOTE — Progress Notes (Signed)
Reason for visit- Marissa Huff is a 81 y.o. female  who is accompanied by her husband and here for follow up of  type 2 Diabetes Mellitus, uncontrolled, with complications (Stage 2 CKD).   HPI-  Last seen here Nov 2015. Diagnosed with DM around 1970s, now on basal/bolus regimen with Lantus/Humalog. *Was on GLP-1 before, but taken off as thought to be behaving like LADA versus ran out of medication around Spring/Summer 2015. Was on Victoza in the past, and reports tolerating it well. Trulicity not covered under her plan and was never filled.  *Has not tolerated Metformin in the past due to diarrhea.  * Restarted Victoza 09/2013   Currently, she is on - - lantus 33 units at bedtime.  - Humalog scale after each meal ( taking this way to cut back on hypoglycemia) 14+1 BF, 16+1 lunch and 15+1 supper -Victoza 1.8 mg [place] daily ( 09/2013)- takes post Bf most of the times, but lately has been very variable with taking this medication- feels that it is working more on some days than the others- controlling appetite though Has been following scale as instructed. Moving insulin injections around. Avoiding the "knots"  Lab Results  Component Value Date   HGBA1C 7.2* 11/26/2013   HGBA1C 9.1 Repeated and verified X2.* 08/13/2013   HGBA1C 7.9* 04/24/2013     She has been monitoring her diet and trying to control her carbohydrates. Exercise and physical activity is limited due to back pain. No more steroid shots in her back since last time.   She brings her BS log and checks her sugars 4 times daily pre meals. Starting to note hyperglycemia throughout the day and some hypoglycemia in the afternoon and evening time.     PREMEAL Breakfast Lunch Dinner Bedtime Overall  Glucose range: 136-293 83-252 71-248 64-318   Mean/median:        Hypoglycemia- some low readings at evening and closer to bedtime.  Correctly appropriately. Not needed assistance recently for this.  Some day time lows as well,  possibly related to inadequate food intake.  Has had modified symptoms of hypoglycemia awareness since her diagnosis, now just feels unwell and doesn't get the typical symptoms.    Nephropathy- Yes, Stage 2 CKD, last BUN/Cr were-   Lab Results  Component Value Date   BUN 20 09/19/2013   Lab Results  Component Value Date   CREATININE 0.9 09/19/2013   GFR 64.78 09/19/2013     Retinopathy- No, Last DEE was august 2015 No known neuropathy. Denies any tingling/numbness in her feet No prior CAD. No hx stroke. Has hypothyroidism that is being treated with Synthroid.  Last TSH was - Lab Results  Component Value Date   TSH 2.11 05/17/2013    Lipids- on dietary therapy. Lab Results  Component Value Date   CHOL 129 09/19/2013   HDL 35.20* 09/19/2013   LDLCALC 79 09/19/2013   TRIG 72.0 09/19/2013   CHOLHDL 4 09/19/2013      Hypertension- She  is on Diovan HCT and tolerating well. BP controlled at this visit.   I have reviewed her past medical history, medications and allergies.   Current Outpatient Prescriptions on File Prior to Visit  Medication Sig Dispense Refill  . acetaminophen (TYLENOL) 500 MG tablet Take 500 mg by mouth every 6 (six) hours as needed.    Marland Kitchen albuterol (PROVENTIL HFA;VENTOLIN HFA) 108 (90 BASE) MCG/ACT inhaler Inhale 2 puffs into the lungs every 6 (six) hours as needed for wheezing or  shortness of breath. 1 Inhaler 1  . amLODipine (NORVASC) 5 MG tablet Take 1 tablet (5 mg total) by mouth daily. 90 tablet 3  . Cholecalciferol (VITAMIN D-3 PO) Take 1 capsule by mouth daily.    Marland Kitchen DOXYLAMINE SUCCINATE, SLEEP, PO Take by mouth.    . fexofenadine (ALLEGRA) 180 MG tablet Take 180 mg by mouth daily.    . fluticasone (FLONASE) 50 MCG/ACT nasal spray Place 2 sprays into both nostrils daily. 48 g 3  . glucagon (GLUCAGON EMERGENCY) 1 MG injection Inject 1 mg into the muscle once as needed. 1 each 12  . glucose blood (ONE TOUCH ULTRA TEST) test strip Use as instructed  four times daily 400 each 2  . imipramine (TOFRANIL) 25 MG tablet Take 25 mg by mouth at bedtime.    . insulin glargine (LANTUS) 100 UNIT/ML injection Inject 33 Units into the skin at bedtime. Pens please  - SOLOSTAR    . insulin lispro (HUMALOG KWIKPEN) 100 UNIT/ML KiwkPen Inject on a sliding scale between 14-28 units with each meal. Max daily dose 90 90 mL 3  . LORazepam (ATIVAN) 0.5 MG tablet 1/2 tablet q day prn 30 tablet 0  . Multiple Vitamins-Minerals (MULTIVITAMIN PO) Take by mouth daily.     . Multiple Vitamins-Minerals (PRESERVISION AREDS PO) Take by mouth 2 (two) times daily.    . pantoprazole (PROTONIX) 40 MG tablet Take 1 tablet (40 mg total) by mouth 2 (two) times daily. 180 tablet 1  . SYNTHROID 175 MCG tablet TAKE 1 TABLET (175 MCG TOTAL) BY MOUTH DAILY BEFORE BREAKFAST. 30 tablet 11  . traMADol (ULTRAM) 50 MG tablet 1 to 2 tabs every 4 to six hours    . valsartan-hydrochlorothiazide (DIOVAN-HCT) 160-12.5 MG per tablet Take 1 tablet by mouth daily. 90 tablet 3   No current facility-administered medications on file prior to visit.   Allergies  Allergen Reactions  . Penicillins Rash    Review of Systems- [ x ]  Complains of    [  ]  denies [  ] Recent weight change [  ]  Fatigue [  ] polydipsia [  ] polyuria [  ]  nocturia [  ]  vision difficulty [  ] chest pain [  ] shortness of breath [  ] leg swelling [  ] cough [  ] nausea/vomiting [ x ] diarrhea [  ] constipation [  ] abdominal pain [  ]  tingling/numbness in extremities [  ]  concern with feet ( wounds/sores)  * having diarrhea on and off since last week. Staying hydrated.   Filed Vitals:   02/26/14 1336  BP: 116/54  Pulse: 80  Resp: 16   Wt Readings from Last 3 Encounters:  02/26/14 185 lb 8 oz (84.142 kg)  01/06/14 185 lb 8 oz (84.142 kg)  11/26/13 187 lb 8 oz (85.049 kg)    Physical Exam   GENERAL: No acute distress, well developed HEENT:  Eye exam shows normal external appearance. Oral exam shows  normal mucosa .  NECK:   Neck exam shows no lymphadenopathy. Thyroid is not enlarged and no nodules felt.   LUNGS:         Chest is symmetrical. Lungs are clear to auscultation.Marland Kitchen   HEART:         Heart sounds:  S1 and S2 are normal. No murmurs or clicks heard. EXTREMITIES:     There is no edema. 2+ DP pulses  NEUROLOGICAL:  Grossly intact.               Assessment and Plan:    Problem List Items Addressed This Visit      Cardiovascular and Mediastinum   Hypertension    BP well controlled at this visit. Continue current medications.  Last urine MA negative May 2015.               Genitourinary   CKD stage 2 due to type 2 diabetes mellitus    Checked recently and stable.  Due for repeat levels next week.       Relevant Medications   Liraglutide 18 MG/3ML SOPN     Other   Type 2 diabetes mellitus, uncontrolled - Primary    Update A1c next week with fasting labs. Meter avg has gone up.   She has responded well to Victoza and is tolerating it well. Having hypoglycemia at various times of the day, but mainly at pm and night. Some recent higher sugars could be associated with her taking Victoza at various times of the day. Asked her to take this med consistently after BF.  Treating lows appropriately.   She will continue to check sugars 4 x daily.  Adjust Lantus to 34 units daily at bedtime. Adjusted Humalog to 16+1 BF/16+1 Lunch/16+1 supper ( detailed chart in pt instructions).  Continue victoza 1.8 mg daily to post Breakfast.   Notify me if she were to continue having lows, as she may need a decrease in her insulin doses as Victoza continues to work.  RTC 3 months          Relevant Medications   Liraglutide 18 MG/3ML SOPN   Other Relevant Orders   Hemoglobin A1c   Hypercholesterolemia    Low cholesterol diet and exercise.  Follow.  Check lipid panel next fasting labs.          Obesity (BMI 30-39.9)    Recent weight stable to decreasing on GLP-1.  Tolerating well.      Relevant Medications   Liraglutide 18 MG/3ML SOPN       -RTC 3 month with meter/log  Bynum Bellows [facility] 02/26/2014 2:25 PM

## 2014-02-26 NOTE — Patient Instructions (Addendum)
Check sugars 4 x daily ( before each meal and at bedtime).  Record them in a log book and bring that/meter to next appointment.   Stay consistent with taking Victoza post BF around 10 am . Change insulin as follows-  Blood Glucose (mg/dL)  Breakfast  (Units Humalog Insulin)  Lunch  (Units Humalog Insulin)  Supper  (Units Humalog Insulin)  Nighttime (Units Humalog Insulin)   <70 Treat the low blood sugar.  Recheck blood glucose  in 15 mins. If sugar is more than 70, then take the number of units of insulin in the 70-90 row, if before a meal.   70-90  10  10  10   0  91-130 (Base Dose)  16  16  16   0   131-150  17  17  17   0   151-200  18  18  18   0   201-250  19  19  19  0   251-300  20  20  20 1    301-350  21  21  21 2    351-400  22  22  22  3    401-450  23  23  23 4    >450  24  24  24  5    Lantus insulin 34 units subcutaneous Nightly    Please come back for a follow-up appointment in 3 months Notify if low sugars.

## 2014-02-26 NOTE — Assessment & Plan Note (Signed)
BP well controlled at this visit. Continue current medications.  Last urine MA negative May 2015.

## 2014-02-26 NOTE — Assessment & Plan Note (Signed)
Recent weight stable to decreasing on GLP-1. Tolerating well.

## 2014-02-26 NOTE — Assessment & Plan Note (Signed)
Update A1c next week with fasting labs. Meter avg has gone up.   She has responded well to Victoza and is tolerating it well. Having hypoglycemia at various times of the day, but mainly at pm and night. Some recent higher sugars could be associated with her taking Victoza at various times of the day. Asked her to take this med consistently after BF.  Treating lows appropriately.   She will continue to check sugars 4 x daily.  Adjust Lantus to 34 units daily at bedtime. Adjusted Humalog to 16+1 BF/16+1 Lunch/16+1 supper ( detailed chart in pt instructions).  Continue victoza 1.8 mg daily to post Breakfast.   Notify me if she were to continue having lows, as she may need a decrease in her insulin doses as Victoza continues to work.  RTC 3 months

## 2014-02-26 NOTE — Assessment & Plan Note (Signed)
Low cholesterol diet and exercise.  Follow.  Check lipid panel next fasting labs.

## 2014-02-27 ENCOUNTER — Telehealth: Payer: Self-pay | Admitting: *Deleted

## 2014-02-27 NOTE — Telephone Encounter (Signed)
It diarrhea has been persistent since Sunday, she needs to be evaluated.  May need stool studies, etc.  Will need to confirm etiology.  If unable to come this pm - can be seen at acute care or tomorrow.

## 2014-02-27 NOTE — Telephone Encounter (Signed)
Pt left voicemail stating that she has had diarrhea since Sunday. It stopped for about a day an then restarted. OTC Immodium not helping. Pt stated that she mentioned this to Dr. Howell Rucks yesterday as well. I offered patient an appt today @ 3:30 with Morey Hummingbird, NP & she does not want to come in because she just washed her hair & can hardly stay out of the bathroom. Please advise.

## 2014-02-27 NOTE — Telephone Encounter (Signed)
Pt scheduled to see [facility] tomorrow. (Copy of note sent to [facility] as an [place])

## 2014-02-28 ENCOUNTER — Ambulatory Visit (INDEPENDENT_AMBULATORY_CARE_PROVIDER_SITE_OTHER): Payer: Medicare HMO | Admitting: Nurse Practitioner

## 2014-02-28 ENCOUNTER — Encounter: Payer: Self-pay | Admitting: Nurse Practitioner

## 2014-02-28 VITALS — BP 118/60 | HR 75 | Temp 97.8°F | Resp 14 | Ht 63.5 in | Wt 183.2 lb

## 2014-02-28 DIAGNOSIS — K529 Noninfective gastroenteritis and colitis, unspecified: Secondary | ICD-10-CM

## 2014-02-28 NOTE — Progress Notes (Signed)
Subjective:    Patient ID: Marissa Huff, female    DOB: Jan 28, 1931, 81 y.o.   MRN: [id number]  HPI  Marissa Huff is a 81 yo female with a CC of diarrhea x 6 days.  1)  Started Sunday. Has this problem often. Dressed to go to church and could not go. Watery stools, a lot, yesterday went "several times" then improved last night. Goes about 4-5 x. No abdominal pain. No BM today  Treatment- Immodium- helpful     BS- 138 this morning   Review of Systems  Constitutional: Negative for fever, chills, diaphoresis and fatigue.  Respiratory: Negative for chest tightness, shortness of breath and wheezing.   Cardiovascular: Negative for chest pain, palpitations and leg swelling.  Gastrointestinal: Positive for diarrhea. Negative for nausea, vomiting and rectal pain.  Skin: Negative for rash.  Neurological: Negative for dizziness, weakness, numbness and headaches.  Psychiatric/Behavioral: The patient is not nervous/anxious.    Past Medical History  Diagnosis Date  . Diabetes mellitus without complication   . Hypothyroidism   . GERD (gastroesophageal reflux disease)   . Hypertension   . Gastric lymphoma   . Allergy   . Spinal stenosis     lumbar laminectomy  . PUD (peptic ulcer disease)   . Gastroparesis   . Diverticulosis   . Carpal tunnel syndrome, bilateral     History   Social History  . Marital Status: Married    Spouse Name: N/A  . Number of Children: N/A  . Years of Education: N/A   Occupational History  . Not on file.   Social History Main Topics  . Smoking status: Never Smoker   . Smokeless tobacco: Never Used  . Alcohol Use: No  . Drug Use: No  . Sexual Activity: Not on file   Other Topics Concern  . Not on file   Social History Narrative    Past Surgical History  Procedure Laterality Date  . Gastric lymphoma  1996    surgery  . Partial hysterectomy  1960    fibroid tumors, ovaries not removed  . Lumbar laminectomy  1999  . Laparoscopic  cholecystectomy  6/01  . Carpal tunnel repai  10/10    left   . Arthroscopic left knee  12/05/08  . Antrectomy and vagotomy  1994  . Tonsillectomy    . Cataract extraction, bilateral    . Abdominal hysterectomy      Family History  Problem Relation Age of Onset  . Stroke Mother   . Stroke Father   . Diabetes Brother   . Hypertension Father   . Breast cancer Daughter   . Colon cancer Neg Hx     Allergies  Allergen Reactions  . Penicillins Rash    Current Outpatient Prescriptions on File Prior to Visit  Medication Sig Dispense Refill  . acetaminophen (TYLENOL) 500 MG tablet Take 500 mg by mouth every 6 (six) hours as needed.    Marland Kitchen albuterol (PROVENTIL HFA;VENTOLIN HFA) 108 (90 BASE) MCG/ACT inhaler Inhale 2 puffs into the lungs every 6 (six) hours as needed for wheezing or shortness of breath. 1 Inhaler 1  . amLODipine (NORVASC) 5 MG tablet Take 1 tablet (5 mg total) by mouth daily. 90 tablet 3  . Cholecalciferol (VITAMIN D-3 PO) Take 1 capsule by mouth daily.    Marland Kitchen DOXYLAMINE SUCCINATE, SLEEP, PO Take by mouth.    . fexofenadine (ALLEGRA) 180 MG tablet Take 180 mg by mouth daily.    . fluticasone (  FLONASE) 50 MCG/ACT nasal spray Place 2 sprays into both nostrils daily. 48 g 3  . glucagon (GLUCAGON EMERGENCY) 1 MG injection Inject 1 mg into the muscle once as needed. 1 each 12  . glucose blood (ONE TOUCH ULTRA TEST) test strip Use as instructed four times daily 400 each 2  . imipramine (TOFRANIL) 25 MG tablet Take 25 mg by mouth at bedtime.    . insulin glargine (LANTUS) 100 UNIT/ML injection Inject 33 Units into the skin at bedtime. Pens please  - SOLOSTAR    . insulin lispro (HUMALOG KWIKPEN) 100 UNIT/ML KiwkPen Inject on a sliding scale between 14-28 units with each meal. Max daily dose 90 90 mL 3  . Liraglutide 18 MG/3ML SOPN Inject 1.8 mg [place] daily 3 pen 3  . LORazepam (ATIVAN) 0.5 MG tablet 1/2 tablet q day prn 30 tablet 0  . Multiple Vitamins-Minerals (MULTIVITAMIN PO) Take  by mouth daily.     . Multiple Vitamins-Minerals (PRESERVISION AREDS PO) Take by mouth 2 (two) times daily.    . pantoprazole (PROTONIX) 40 MG tablet Take 1 tablet (40 mg total) by mouth 2 (two) times daily. 180 tablet 1  . SYNTHROID 175 MCG tablet TAKE 1 TABLET (175 MCG TOTAL) BY MOUTH DAILY BEFORE BREAKFAST. 30 tablet 11  . traMADol (ULTRAM) 50 MG tablet 1 to 2 tabs every 4 to six hours    . valsartan-hydrochlorothiazide (DIOVAN-HCT) 160-12.5 MG per tablet Take 1 tablet by mouth daily. 90 tablet 3   No current facility-administered medications on file prior to visit.      Objective:   Physical Exam  Constitutional: She is oriented to person, place, and time. She appears well-developed and well-nourished. No distress.  BP 118/60 mmHg  Pulse 75  Temp(Src) 97.8 F (36.6 C) (Oral)  Resp 14  Ht 5' 3.5" (1.613 m)  Wt 183 lb 4 oz (83.122 kg)  BMI 31.95 kg/m2  SpO2 97%   HENT:  Head: Normocephalic and atraumatic.  Right Ear: External ear normal.  Left Ear: External ear normal.  Eyes: Right eye exhibits no discharge. Left eye exhibits no discharge. No scleral icterus.  Cardiovascular: Normal rate, regular rhythm, normal heart sounds and intact distal pulses.  Exam reveals no gallop and no friction rub.   No murmur heard. Pulmonary/Chest: Effort normal and breath sounds normal. No respiratory distress. She has no wheezes. She has no rales. She exhibits no tenderness.  Abdominal: Soft. Bowel sounds are normal. She exhibits no distension and no mass. There is no tenderness. There is no rebound and no guarding.  Neurological: She is alert and oriented to person, place, and time. No cranial nerve deficit. She exhibits normal muscle tone. Coordination normal.  Skin: Skin is warm and dry. No rash noted. She is not diaphoretic.  Psychiatric: She has a normal mood and affect. Her behavior is normal. Judgment and thought content normal.      Assessment & Plan:

## 2014-02-28 NOTE — Patient Instructions (Signed)
Please visit the lab and get your supplies and instructions.   Bring your stool sample 8-5 Monday through Friday to drop off at the lab.

## 2014-03-03 ENCOUNTER — Other Ambulatory Visit (INDEPENDENT_AMBULATORY_CARE_PROVIDER_SITE_OTHER): Payer: Commercial Managed Care - HMO

## 2014-03-03 DIAGNOSIS — E7800 Pure hypercholesterolemia, unspecified: Secondary | ICD-10-CM

## 2014-03-03 DIAGNOSIS — I10 Essential (primary) hypertension: Secondary | ICD-10-CM

## 2014-03-03 DIAGNOSIS — E1122 Type 2 diabetes mellitus with diabetic chronic kidney disease: Secondary | ICD-10-CM

## 2014-03-03 DIAGNOSIS — E1165 Type 2 diabetes mellitus with hyperglycemia: Secondary | ICD-10-CM

## 2014-03-03 DIAGNOSIS — N182 Chronic kidney disease, stage 2 (mild): Principal | ICD-10-CM

## 2014-03-03 DIAGNOSIS — IMO0002 Reserved for concepts with insufficient information to code with codable children: Secondary | ICD-10-CM

## 2014-03-03 LAB — CBC WITH DIFFERENTIAL/PLATELET
Basophils Absolute: 0 10*3/uL (ref 0.0–0.1)
Basophils Relative: 0.6 % (ref 0.0–3.0)
Eosinophils Absolute: 0.1 10*3/uL (ref 0.0–0.7)
Eosinophils Relative: 3.1 % (ref 0.0–5.0)
HCT: 36.4 % (ref 36.0–46.0)
Hemoglobin: 12.5 g/dL (ref 12.0–15.0)
Lymphocytes Relative: 24 % (ref 12.0–46.0)
Lymphs Abs: 1.2 10*3/uL (ref 0.7–4.0)
MCHC: 34.3 g/dL (ref 30.0–36.0)
MCV: 85.8 fl (ref 78.0–100.0)
Monocytes Absolute: 0.5 10*3/uL (ref 0.1–1.0)
Monocytes Relative: 10.2 % (ref 3.0–12.0)
Neutro Abs: 3 10*3/uL (ref 1.4–7.7)
Neutrophils Relative %: 62.1 % (ref 43.0–77.0)
Platelets: 290 10*3/uL (ref 150.0–400.0)
RBC: 4.24 Mil/uL (ref 3.87–5.11)
RDW: 13.9 % (ref 11.5–15.5)
WBC: 4.8 10*3/uL (ref 4.0–10.5)

## 2014-03-03 LAB — HEPATIC FUNCTION PANEL
ALT: 17 U/L (ref 0–35)
AST: 14 U/L (ref 0–37)
Albumin: 4 g/dL (ref 3.5–5.2)
Alkaline Phosphatase: 98 U/L (ref 39–117)
Bilirubin, Direct: 0.1 mg/dL (ref 0.0–0.3)
Total Bilirubin: 0.4 mg/dL (ref 0.2–1.2)
Total Protein: 6.9 g/dL (ref 6.0–8.3)

## 2014-03-03 LAB — BASIC METABOLIC PANEL
BUN: 16 mg/dL (ref 6–23)
CO2: 23 mEq/L (ref 19–32)
Calcium: 9.5 mg/dL (ref 8.4–10.5)
Chloride: 106 mEq/L (ref 96–112)
Creatinine, Ser: 0.81 mg/dL (ref 0.40–1.20)
GFR: 72.13 mL/min (ref >60.00)
Glucose, Bld: 167 mg/dL — ABNORMAL HIGH (ref 70–99)
Potassium: 3.7 mEq/L (ref 3.5–5.1)
Sodium: 139 mEq/L (ref 135–145)

## 2014-03-03 LAB — HEMOGLOBIN A1C: Hgb A1c MFr Bld: 7.8 % — ABNORMAL HIGH (ref 4.6–6.5)

## 2014-03-03 LAB — LIPID PANEL
Cholesterol: 101 mg/dL (ref 0–200)
HDL: 33.5 mg/dL — ABNORMAL LOW (ref >39.00)
LDL Cholesterol: 54 mg/dL (ref 0–99)
NonHDL: 67.5
Total CHOL/HDL Ratio: 3
Triglycerides: 69 mg/dL (ref 0.0–149.0)
VLDL: 13.8 mg/dL (ref 0.0–40.0)

## 2014-03-04 ENCOUNTER — Encounter: Payer: Self-pay | Admitting: Internal Medicine

## 2014-03-04 LAB — CLOSTRIDIUM DIFFICILE BY PCR: Toxigenic C. Difficile by PCR: NOT DETECTED

## 2014-03-04 LAB — OVA AND PARASITE EXAMINATION: OP: NONE SEEN

## 2014-03-05 DIAGNOSIS — K529 Noninfective gastroenteritis and colitis, unspecified: Secondary | ICD-10-CM | POA: Insufficient documentation

## 2014-03-05 NOTE — Assessment & Plan Note (Signed)
Pt reports chronic diarrhea. She reports this acute episode of 6 days. Followed by GI and seeing soon. Would like to see what is going on currently. Will obtain Stool cultures, O&P, and C diff by PCR.

## 2014-03-06 NOTE — Telephone Encounter (Signed)
Unread mychart message mailed to patient 

## 2014-03-07 LAB — STOOL CULTURE

## 2014-03-10 ENCOUNTER — Encounter: Payer: Self-pay | Admitting: Endocrinology

## 2014-03-17 ENCOUNTER — Other Ambulatory Visit: Payer: Self-pay | Admitting: *Deleted

## 2014-03-17 MED ORDER — INSULIN GLARGINE 100 UNIT/ML ~~LOC~~ SOLN: 33.0000 [IU] | Freq: Every day | SUBCUTANEOUS | Status: DC

## 2014-04-03 ENCOUNTER — Other Ambulatory Visit: Payer: Self-pay | Admitting: *Deleted

## 2014-04-03 DIAGNOSIS — IMO0002 Reserved for concepts with insufficient information to code with codable children: Secondary | ICD-10-CM

## 2014-04-03 DIAGNOSIS — E1165 Type 2 diabetes mellitus with hyperglycemia: Secondary | ICD-10-CM

## 2014-04-03 DIAGNOSIS — E118 Type 2 diabetes mellitus with unspecified complications: Principal | ICD-10-CM

## 2014-04-03 MED ORDER — GLUCOSE BLOOD VI STRP: ORAL_STRIP | Status: DC

## 2014-04-03 MED ORDER — ACCU-CHEK AVIVA DEVI
Status: AC
Start: 2014-04-03 — End: 2015-04-03

## 2014-04-03 MED ORDER — ACCU-CHEK SOFT TOUCH LANCETS MISC: Status: DC

## 2014-04-03 NOTE — Telephone Encounter (Signed)
Fax from [facility], insurance will not cover One [employer], requesting Rx for Accu-chek meter, strips, and lancets. Rx sent to pharmacy by escript

## 2014-04-03 NOTE — Addendum Note (Signed)
Addended by: Wynonia Lawman E on: 04/03/2014 02:13 PM   Modules accepted: Orders

## 2014-04-10 ENCOUNTER — Other Ambulatory Visit: Payer: Self-pay | Admitting: Internal Medicine

## 2014-05-08 ENCOUNTER — Encounter: Payer: Self-pay | Admitting: Internal Medicine

## 2014-05-08 ENCOUNTER — Ambulatory Visit (INDEPENDENT_AMBULATORY_CARE_PROVIDER_SITE_OTHER): Payer: Commercial Managed Care - HMO | Admitting: Internal Medicine

## 2014-05-08 VITALS — BP 118/60 | HR 76 | Temp 98.1°F | Ht 63.5 in | Wt 180.1 lb

## 2014-05-08 DIAGNOSIS — K219 Gastro-esophageal reflux disease without esophagitis: Secondary | ICD-10-CM

## 2014-05-08 DIAGNOSIS — I10 Essential (primary) hypertension: Secondary | ICD-10-CM | POA: Diagnosis not present

## 2014-05-08 DIAGNOSIS — Z0000 Encounter for general adult medical examination without abnormal findings: Secondary | ICD-10-CM

## 2014-05-08 DIAGNOSIS — Z9109 Other allergy status, other than to drugs and biological substances: Secondary | ICD-10-CM

## 2014-05-08 DIAGNOSIS — E1165 Type 2 diabetes mellitus with hyperglycemia: Secondary | ICD-10-CM | POA: Diagnosis not present

## 2014-05-08 DIAGNOSIS — Z91048 Other nonmedicinal substance allergy status: Secondary | ICD-10-CM

## 2014-05-08 DIAGNOSIS — E7800 Pure hypercholesterolemia, unspecified: Secondary | ICD-10-CM

## 2014-05-08 DIAGNOSIS — E669 Obesity, unspecified: Secondary | ICD-10-CM

## 2014-05-08 DIAGNOSIS — E039 Hypothyroidism, unspecified: Secondary | ICD-10-CM | POA: Diagnosis not present

## 2014-05-08 DIAGNOSIS — IMO0002 Reserved for concepts with insufficient information to code with codable children: Secondary | ICD-10-CM

## 2014-05-08 LAB — POCT CBG (FASTING - GLUCOSE)-MANUAL ENTRY: Glucose Fasting, POC: 161 mg/dL — AB (ref 70–99)

## 2014-05-08 MED ORDER — PANTOPRAZOLE SODIUM 40 MG PO TBEC: 40.0000 mg | DELAYED_RELEASE_TABLET | Freq: Two times a day (BID) | ORAL | Status: DC

## 2014-05-08 MED ORDER — VALSARTAN-HYDROCHLOROTHIAZIDE 160-12.5 MG PO TABS: 1.0000 | ORAL_TABLET | Freq: Every day | ORAL | Status: DC

## 2014-05-08 MED ORDER — LORAZEPAM 0.5 MG PO TABS: ORAL_TABLET | ORAL | Status: DC

## 2014-05-08 NOTE — Progress Notes (Signed)
Patient ID: Marissa Huff, female   DOB: 11-Jun-1931, 81 y.o.   MRN: [id number]   Subjective:    Patient ID: Marissa Huff, female    DOB: 11-Apr-1931, 81 y.o.   MRN: [id number]  HPI  Patient here for a scheduled follow up.  Receiving allergy shots.  Seeing Dr Kathyrn Sheriff.  Stable.  Sees Dr Howell Rucks.  They have been adjusting her medications.  See Dr Boyd Kerbs note for details.  Having lowe sugars.  States time varies.  May occur 1-3x/week.  Tries to stay active.  breathing stable.     Past Medical History  Diagnosis Date  . Diabetes mellitus without complication   . Hypothyroidism   . GERD (gastroesophageal reflux disease)   . Hypertension   . Gastric lymphoma   . Allergy   . Spinal stenosis     lumbar laminectomy  . PUD (peptic ulcer disease)   . Gastroparesis   . Diverticulosis   . Carpal tunnel syndrome, bilateral     Current Outpatient Prescriptions on File Prior to Visit  Medication Sig Dispense Refill  . acetaminophen (TYLENOL) 500 MG tablet Take 500 mg by mouth every 6 (six) hours as needed.    Marland Kitchen albuterol (PROVENTIL HFA;VENTOLIN HFA) 108 (90 BASE) MCG/ACT inhaler Inhale 2 puffs into the lungs every 6 (six) hours as needed for wheezing or shortness of breath. 1 Inhaler 1  . amLODipine (NORVASC) 5 MG tablet Take 1 tablet (5 mg total) by mouth daily. 90 tablet 3  . Blood Glucose Monitoring Suppl (ACCU-CHEK AVIVA) device Check sugar 4 times daily Dx E11.8 1 each 0  . Cholecalciferol (VITAMIN D-3 PO) Take 1 capsule by mouth daily.    Marland Kitchen DOXYLAMINE SUCCINATE, SLEEP, PO Take by mouth.    . fexofenadine (ALLEGRA) 180 MG tablet Take 180 mg by mouth daily.    . fluticasone (FLONASE) 50 MCG/ACT nasal spray Place 2 sprays into both nostrils daily. 48 g 3  . glucagon (GLUCAGON EMERGENCY) 1 MG injection Inject 1 mg into the muscle once as needed. 1 each 12  . glucose blood (ACCU-CHEK AVIVA) test strip Check sugar 4 times daily Dx E11.8 200 each 5  . imipramine (TOFRANIL) 25 MG tablet  Take 25 mg by mouth at bedtime.    . insulin glargine (LANTUS) 100 UNIT/ML injection Inject 0.33 mLs (33 Units total) into the skin at bedtime. Pens please  - SOLOSTAR 10 mL 5  . insulin lispro (HUMALOG KWIKPEN) 100 UNIT/ML KiwkPen Inject on a sliding scale between 14-28 units with each meal. Max daily dose 90 90 mL 3  . Lancets (ACCU-CHEK SOFT TOUCH) lancets Use as instructed. Dx E11.8 200 each 5  . Liraglutide 18 MG/3ML SOPN Inject 1.8 mg [place] daily 3 pen 3  . Multiple Vitamins-Minerals (MULTIVITAMIN PO) Take by mouth daily.     . Multiple Vitamins-Minerals (PRESERVISION AREDS PO) Take by mouth 2 (two) times daily.    Marland Kitchen SYNTHROID 175 MCG tablet TAKE 1 TABLET (175 MCG TOTAL) BY MOUTH DAILY BEFORE BREAKFAST. 30 tablet 11  . traMADol (ULTRAM) 50 MG tablet 1 to 2 tabs every 4 to six hours     No current facility-administered medications on file prior to visit.    Review of Systems  Constitutional: Negative for appetite change and unexpected weight change.  HENT: Negative for congestion and sinus pressure.   Respiratory: Negative for cough, chest tightness and shortness of breath.   Cardiovascular: Negative for chest pain, palpitations and leg swelling.  Gastrointestinal: Negative  for nausea, vomiting, abdominal pain and diarrhea.  Genitourinary: Negative for dysuria and difficulty urinating.  Skin: Negative for color change and rash.  Neurological: Negative for dizziness, light-headedness and headaches.  Psychiatric/Behavioral: Negative for dysphoric mood and agitation.       Objective:    Physical Exam  Constitutional: She appears well-developed and well-nourished. No distress.  HENT:  Nose: Nose normal.  Mouth/Throat: Oropharynx is clear and moist.  Neck: Neck supple. No thyromegaly present.  Cardiovascular: Normal rate and regular rhythm.   Pulmonary/Chest: Breath sounds normal. No respiratory distress. She has no wheezes.  Abdominal: Soft. Bowel sounds are normal. There is no  tenderness.  Musculoskeletal: She exhibits no edema or tenderness.  Lymphadenopathy:    She has no cervical adenopathy.  Skin: No rash noted. No erythema.  Psychiatric: She has a normal mood and affect. Her behavior is normal.    BP 118/60 mmHg  Pulse 76  Temp(Src) 98.1 F (36.7 C) (Oral)  Ht 5' 3.5" (1.613 m)  Wt 180 lb 2 oz (81.704 kg)  BMI 31.40 kg/m2  SpO2 97% Wt Readings from Last 3 Encounters:  05/08/14 180 lb 2 oz (81.704 kg)  02/28/14 183 lb 4 oz (83.122 kg)  02/26/14 185 lb 8 oz (84.142 kg)     Lab Results  Component Value Date   WBC 4.8 03/03/2014   HGB 12.5 03/03/2014   HCT 36.4 03/03/2014   PLT 290.0 03/03/2014   GLUCOSE 167* 03/03/2014   CHOL 101 03/03/2014   TRIG 69.0 03/03/2014   HDL 33.50* 03/03/2014   LDLCALC 54 03/03/2014   ALT 17 03/03/2014   AST 14 03/03/2014   NA 139 03/03/2014   K 3.7 03/03/2014   CL 106 03/03/2014   CREATININE 0.81 03/03/2014   BUN 16 03/03/2014   CO2 23 03/03/2014   TSH 2.11 05/17/2013   HGBA1C 7.8* 03/03/2014   MICROALBUR 0.5 05/17/2013       Assessment & Plan:   Problem List Items Addressed This Visit    Environmental allergies    Seeing Dr Kathyrn Sheriff.  Receiving allergy injections.  Follow. Stable.       GERD (gastroesophageal reflux disease)    No upper symptoms reported.  Follow.  On protonix.        Relevant Medications   pantoprazole (PROTONIX) 40 MG tablet   Health care maintenance    Physical 05/08/14.  Mammogram 07/08/13.   Saw Dr Candace Cruise recently.  Felt no further w/up warranted.        Hypercholesterolemia    Low cholesterol diet and exercise.  Follow lipid panel.        Relevant Medications   valsartan-hydrochlorothiazide (DIOVAN-HCT) 160-12.5 MG per tablet   Other Relevant Orders   Lipid panel   Hepatic function panel   Hypertension    Blood pressure doing well.  Same medication regimen.  Follow pressures.        Relevant Medications   valsartan-hydrochlorothiazide (DIOVAN-HCT) 160-12.5 MG per  tablet   Hypothyroidism    Follow tsh.  Check with next labs.       Relevant Orders   TSH   Obesity (BMI 30-39.9)    Diet and exercise.  Follow.        Type 2 diabetes mellitus, uncontrolled    With the lows as outlined.  Decrease lantus by 2 units.  Send in readings over the next week.  May need to adjust more.        Relevant Medications   valsartan-hydrochlorothiazide (  DIOVAN-HCT) 160-12.5 MG per tablet    Other Visit Diagnoses    Type II diabetes mellitus, uncontrolled    -  Primary    Relevant Medications    valsartan-hydrochlorothiazide (DIOVAN-HCT) 160-12.5 MG per tablet    Other Relevant Orders    POCT CBG (Fasting - Glucose) (Completed)    Hemoglobin T5H    Basic metabolic panel    Microalbumin / creatinine urine ratio      I spent 25 minutes with the patient and more than 50% of the time was spent in consultation regarding the above.     Einar Pheasant, MD

## 2014-05-08 NOTE — Patient Instructions (Signed)
Decrease your lantus to 32 units.

## 2014-05-08 NOTE — Progress Notes (Signed)
Pre visit review using our clinic review tool, if applicable. No additional management support is needed unless otherwise documented below in the visit note. 

## 2014-05-11 ENCOUNTER — Encounter: Payer: Self-pay | Admitting: Internal Medicine

## 2014-05-11 DIAGNOSIS — Z0000 Encounter for general adult medical examination without abnormal findings: Secondary | ICD-10-CM | POA: Insufficient documentation

## 2014-05-11 NOTE — Assessment & Plan Note (Signed)
No upper symptoms reported.  Follow.  On protonix.

## 2014-05-11 NOTE — Assessment & Plan Note (Signed)
With the lows as outlined.  Decrease lantus by 2 units.  Send in readings over the next week.  May need to adjust more.

## 2014-05-11 NOTE — Assessment & Plan Note (Signed)
Follow tsh.  Check with next labs.

## 2014-05-11 NOTE — Assessment & Plan Note (Signed)
Diet and exercise.  Follow.  

## 2014-05-11 NOTE — Assessment & Plan Note (Signed)
Blood pressure doing well.  Same medication regimen.  Follow pressures.   

## 2014-05-11 NOTE — Assessment & Plan Note (Signed)
Seeing Dr Kathyrn Sheriff.  Receiving allergy injections.  Follow. Stable.

## 2014-05-11 NOTE — Assessment & Plan Note (Signed)
Low cholesterol diet and exercise.  Follow lipid panel.   

## 2014-05-11 NOTE — Assessment & Plan Note (Signed)
Physical 05/08/14.  Mammogram 07/08/13.   Saw Dr Candace Cruise recently.  Felt no further w/up warranted.

## 2014-05-28 ENCOUNTER — Ambulatory Visit (INDEPENDENT_AMBULATORY_CARE_PROVIDER_SITE_OTHER): Payer: Medicare PPO | Admitting: Endocrinology

## 2014-05-28 ENCOUNTER — Encounter: Payer: Self-pay | Admitting: Endocrinology

## 2014-05-28 VITALS — BP 110/62 | HR 77 | Resp 14 | Ht 63.5 in | Wt 180.0 lb

## 2014-05-28 DIAGNOSIS — E7800 Pure hypercholesterolemia, unspecified: Secondary | ICD-10-CM

## 2014-05-28 DIAGNOSIS — N182 Chronic kidney disease, stage 2 (mild): Secondary | ICD-10-CM

## 2014-05-28 DIAGNOSIS — IMO0002 Reserved for concepts with insufficient information to code with codable children: Secondary | ICD-10-CM

## 2014-05-28 DIAGNOSIS — E1165 Type 2 diabetes mellitus with hyperglycemia: Secondary | ICD-10-CM | POA: Diagnosis not present

## 2014-05-28 DIAGNOSIS — E1122 Type 2 diabetes mellitus with diabetic chronic kidney disease: Secondary | ICD-10-CM | POA: Diagnosis not present

## 2014-05-28 DIAGNOSIS — I10 Essential (primary) hypertension: Secondary | ICD-10-CM | POA: Diagnosis not present

## 2014-05-28 NOTE — Progress Notes (Signed)
Pre visit review using our clinic review tool, if applicable. No additional management support is needed unless otherwise documented below in the visit note. 

## 2014-05-28 NOTE — Progress Notes (Signed)
Reason for visit- Marissa Huff is a 81 y.o. female  who is accompanied by her husband and here for follow up of  type 2 Diabetes Mellitus, uncontrolled, with complications (Stage 2 CKD).   HPI-  Last seen here Feb 2016. Diagnosed with DM around 1970s, now on basal/bolus regimen with Lantus/Humalog.  *Was on GLP-1 before, but taken off as thought to be behaving like LADA versus ran out of medication around Spring/Summer 2015. Was on Victoza in the past, and reports tolerating it well. Trulicity not covered under her plan and was never filled.  *Has not tolerated Metformin in the past due to diarrhea.  * Restarted Victoza 09/2013, doing well   Currently, she is on - - lantus 32 units at bedtime ( interim dose reduction due to hypoglycemia).  - Humalog scale after each meal ( taking this way to cut back on hypoglycemia) 16+1 BF, 16+1 lunch and 16+1 supper -Victoza 1.8 mg [place] daily ( 09/2013)- takes post Bf most of the times, but lately has been very variable with taking this medication- feels that it is working more on some days than the others- controlling appetite though Has been following scale as instructed. Moving insulin injections around. Avoiding the "knots"   Lab Results  Component Value Date   HGBA1C 7.8* 03/03/2014   HGBA1C 7.2* 11/26/2013   HGBA1C 9.1 Repeated and verified X2.* 08/13/2013   * now in donut hole and wants to try come off Victoza *moved to accuchek meter and supplies  She has been monitoring her diet and trying to control her carbohydrates. Exercise and physical activity is limited due to back pain. No more steroid shots in her back since last time.   She brings her BS log and checks her sugars 4 times daily pre meals. Some mild premeal hypoglycemia at various times of the day. Suspect that she is sensitive to excess exertion     PREMEAL Breakfast Lunch Dinner Bedtime Overall  Glucose range: 146-237 86-303 85-287 107-329   Mean/median:         Hypoglycemia- some low readings through the day.  Correctly appropriately. Not needed assistance recently for this.  Some day time lows as well, possibly related to inadequate food intake/excess exertion.  Has had modified symptoms of hypoglycemia awareness since her diagnosis, now just feels unwell and doesn't get the typical symptoms.    Nephropathy- Yes, Stage 2 CKD, last BUN/Cr were-   Lab Results  Component Value Date   BUN 16 03/03/2014   Lab Results  Component Value Date   CREATININE 0.81 03/03/2014   GFR 72.13 03/03/2014   Lab Results  Component Value Date   MICRALBCREAT 0.9 05/17/2013      Retinopathy- No, Last DEE was august 2015 No known neuropathy. Denies any tingling/numbness in her feet No prior CAD. No hx stroke. Has hypothyroidism that is being treated with Synthroid.  Last TSH was - Lab Results  Component Value Date   TSH 2.11 05/17/2013    Lipids- on dietary therapy. Lab Results  Component Value Date   CHOL 101 03/03/2014   HDL 33.50* 03/03/2014   LDLCALC 54 03/03/2014   TRIG 69.0 03/03/2014   CHOLHDL 3 03/03/2014      Hypertension- She  is on Diovan HCT and tolerating well. BP controlled at this visit.   I have reviewed her past medical history, medications and allergies.   Current Outpatient Prescriptions on File Prior to Visit  Medication Sig Dispense Refill  . acetaminophen (TYLENOL)  500 MG tablet Take 500 mg by mouth every 6 (six) hours as needed.    Marland Kitchen albuterol (PROVENTIL HFA;VENTOLIN HFA) 108 (90 BASE) MCG/ACT inhaler Inhale 2 puffs into the lungs every 6 (six) hours as needed for wheezing or shortness of breath. 1 Inhaler 1  . amLODipine (NORVASC) 5 MG tablet Take 1 tablet (5 mg total) by mouth daily. 90 tablet 3  . Blood Glucose Monitoring Suppl (ACCU-CHEK AVIVA) device Check sugar 4 times daily Dx E11.8 1 each 0  . Cholecalciferol (VITAMIN D-3 PO) Take 1 capsule by mouth daily.    Marland Kitchen DOXYLAMINE SUCCINATE, SLEEP, PO Take by mouth.     . fexofenadine (ALLEGRA) 180 MG tablet Take 180 mg by mouth daily.    . fluticasone (FLONASE) 50 MCG/ACT nasal spray Place 2 sprays into both nostrils daily. 48 g 3  . glucagon (GLUCAGON EMERGENCY) 1 MG injection Inject 1 mg into the muscle once as needed. 1 each 12  . glucose blood (ACCU-CHEK AVIVA) test strip Check sugar 4 times daily Dx E11.8 200 each 5  . imipramine (TOFRANIL) 25 MG tablet Take 25 mg by mouth at bedtime.    . insulin glargine (LANTUS) 100 UNIT/ML injection Inject 0.33 mLs (33 Units total) into the skin at bedtime. Pens please  - SOLOSTAR 10 mL 5  . insulin lispro (HUMALOG KWIKPEN) 100 UNIT/ML KiwkPen Inject on a sliding scale between 14-28 units with each meal. Max daily dose 90 90 mL 3  . Lancets (ACCU-CHEK SOFT TOUCH) lancets Use as instructed. Dx E11.8 200 each 5  . Liraglutide 18 MG/3ML SOPN Inject 1.8 mg [place] daily 3 pen 3  . LORazepam (ATIVAN) 0.5 MG tablet 1/2 tablet q day prn 30 tablet 0  . Multiple Vitamins-Minerals (MULTIVITAMIN PO) Take by mouth daily.     . Multiple Vitamins-Minerals (PRESERVISION AREDS PO) Take by mouth 2 (two) times daily.    . pantoprazole (PROTONIX) 40 MG tablet Take 1 tablet (40 mg total) by mouth 2 (two) times daily. 180 tablet 1  . SYNTHROID 175 MCG tablet TAKE 1 TABLET (175 MCG TOTAL) BY MOUTH DAILY BEFORE BREAKFAST. 30 tablet 11  . traMADol (ULTRAM) 50 MG tablet 1 to 2 tabs every 4 to six hours    . valsartan-hydrochlorothiazide (DIOVAN-HCT) 160-12.5 MG per tablet Take 1 tablet by mouth daily. 90 tablet 3   No current facility-administered medications on file prior to visit.   Allergies  Allergen Reactions  . Penicillins Rash    Review of Systems- [ x ]  Complains of    [  ]  denies [  ] Recent weight change [  ]  Fatigue [  ] polydipsia [  ] polyuria [  ]  nocturia [  ]  vision difficulty [  ] chest pain [  ] shortness of breath [  ] leg swelling [  ] cough [  ] nausea/vomiting [  ] diarrhea [  ] constipation [  ] abdominal  pain [  ]  tingling/numbness in extremities [  ]  concern with feet ( wounds/sores)  *had a recent UTI, for which she is being treated  Filed Vitals:   05/28/14 1341  BP: 110/62  Pulse: 77  Resp: 14   Wt Readings from Last 3 Encounters:  05/28/14 180 lb (81.647 kg)  05/08/14 180 lb 2 oz (81.704 kg)  02/28/14 183 lb 4 oz (83.122 kg)    Exam: deferred        Assessment and Plan:  Problem List Items Addressed This Visit      Cardiovascular and Mediastinum   Hypertension - Primary    BP well controlled at this visit. Continue current medications.  Last urine MA negative May 2015. Update with next labs               Relevant Orders   Hemoglobin A1c   Microalbumin / creatinine urine ratio     Genitourinary   CKD stage 2 due to type 2 diabetes mellitus    Checked recently and stable.  Due for repeat levels next week.         Relevant Orders   Hemoglobin A1c   Microalbumin / creatinine urine ratio     Other   Type 2 diabetes mellitus, uncontrolled    Update A1c next week with fasting labs.  She has responded well to Victoza and is tolerating it well. However, cost if a concern. Without Victoza she will likely see her sugars go up and need for more insulin. She wants to try this.l  Have asked her to stop Victoza. Return next week for labs and drop off sugars for further adjustments. Continue current Lantus and Humalog for now. Take extra snack when doing more exertion than usual.               Relevant Orders   Hemoglobin A1c   Microalbumin / creatinine urine ratio   Hypercholesterolemia    Low cholesterol diet and exercise.  Follow.           Relevant Orders   Hemoglobin A1c   Microalbumin / creatinine urine ratio       -RTC 3 month with meter/log  [facility] 06/01/2014 2:17 PM

## 2014-05-28 NOTE — Patient Instructions (Addendum)
Stop Victoza.  Continue current lantus.  Labs next week.  Drop off sugars next week after stopping victoza  Please come back for a follow-up appointment in 3 months

## 2014-06-01 NOTE — Assessment & Plan Note (Signed)
Low cholesterol diet and exercise.  Follow.   

## 2014-06-01 NOTE — Assessment & Plan Note (Signed)
BP well controlled at this visit. Continue current medications.  Last urine MA negative May 2015. Update with next labs

## 2014-06-01 NOTE — Assessment & Plan Note (Signed)
Update A1c next week with fasting labs.  She has responded well to Victoza and is tolerating it well. However, cost if a concern. Without Victoza she will likely see her sugars go up and need for more insulin. She wants to try this.l  Have asked her to stop Victoza. Return next week for labs and drop off sugars for further adjustments. Continue current Lantus and Humalog for now. Take extra snack when doing more exertion than usual.

## 2014-06-01 NOTE — Assessment & Plan Note (Signed)
Checked recently and stable.  Due for repeat levels next week.

## 2014-06-11 ENCOUNTER — Other Ambulatory Visit (INDEPENDENT_AMBULATORY_CARE_PROVIDER_SITE_OTHER): Payer: Medicare PPO

## 2014-06-11 ENCOUNTER — Telehealth: Payer: Self-pay | Admitting: Internal Medicine

## 2014-06-11 DIAGNOSIS — E1165 Type 2 diabetes mellitus with hyperglycemia: Secondary | ICD-10-CM | POA: Diagnosis not present

## 2014-06-11 DIAGNOSIS — E7800 Pure hypercholesterolemia, unspecified: Secondary | ICD-10-CM

## 2014-06-11 DIAGNOSIS — IMO0002 Reserved for concepts with insufficient information to code with codable children: Secondary | ICD-10-CM

## 2014-06-11 DIAGNOSIS — E039 Hypothyroidism, unspecified: Secondary | ICD-10-CM | POA: Diagnosis not present

## 2014-06-11 DIAGNOSIS — N182 Chronic kidney disease, stage 2 (mild): Secondary | ICD-10-CM

## 2014-06-11 DIAGNOSIS — I10 Essential (primary) hypertension: Secondary | ICD-10-CM

## 2014-06-11 DIAGNOSIS — E1122 Type 2 diabetes mellitus with diabetic chronic kidney disease: Secondary | ICD-10-CM

## 2014-06-11 LAB — BASIC METABOLIC PANEL
BUN: 18 mg/dL (ref 6–23)
CO2: 26 mEq/L (ref 19–32)
Calcium: 9 mg/dL (ref 8.4–10.5)
Chloride: 102 mEq/L (ref 96–112)
Creatinine, Ser: 0.8 mg/dL (ref 0.40–1.20)
GFR: 73.12 mL/min (ref >60.00)
Glucose, Bld: 231 mg/dL — ABNORMAL HIGH (ref 70–99)
Potassium: 3.7 mEq/L (ref 3.5–5.1)
Sodium: 134 mEq/L — ABNORMAL LOW (ref 135–145)

## 2014-06-11 LAB — LIPID PANEL
Cholesterol: 123 mg/dL (ref 0–200)
HDL: 46 mg/dL (ref >39.00)
LDL Cholesterol: 65 mg/dL (ref 0–99)
NonHDL: 77
Total CHOL/HDL Ratio: 3
Triglycerides: 58 mg/dL (ref 0.0–149.0)
VLDL: 11.6 mg/dL (ref 0.0–40.0)

## 2014-06-11 LAB — HEPATIC FUNCTION PANEL
ALT: 13 U/L (ref 0–35)
AST: 14 U/L (ref 0–37)
Albumin: 3.8 g/dL (ref 3.5–5.2)
Alkaline Phosphatase: 107 U/L (ref 39–117)
Bilirubin, Direct: 0.1 mg/dL (ref 0.0–0.3)
Total Bilirubin: 0.5 mg/dL (ref 0.2–1.2)
Total Protein: 6.6 g/dL (ref 6.0–8.3)

## 2014-06-11 LAB — MICROALBUMIN / CREATININE URINE RATIO
Creatinine,U: 83.4 mg/dL
Microalb Creat Ratio: 1.1 mg/g (ref 0.0–30.0)
Microalb, Ur: 0.9 mg/dL (ref 0.0–1.9)

## 2014-06-11 LAB — HEMOGLOBIN A1C: Hgb A1c MFr Bld: 7.2 % — ABNORMAL HIGH (ref 4.6–6.5)

## 2014-06-11 LAB — TSH: TSH: 0.91 u[IU]/mL (ref 0.35–4.50)

## 2014-06-11 NOTE — Telephone Encounter (Signed)
Pt dropped off letter. Letter in Dr. Boyd Kerbs box/msn

## 2014-06-11 NOTE — Telephone Encounter (Signed)
Left message for pt to return my call.

## 2014-06-11 NOTE — Telephone Encounter (Signed)
Sugar readings given to Dr. Howell Rucks

## 2014-06-11 NOTE — Telephone Encounter (Signed)
Reviewed sugar log- overall sugars are not bad at all majority of the times- but there are multiple low readings in the night time.  These lows are almost always associated with taking higher doses of Humalog at dinner ( when her sugars are higher at dinner, she needs more insulin to cover that per the scale and then results in low).   Please be sure that she is not checking her before supper reading after eating a snack- this could artificially raise her "premeal sugar".  I am also going to change her scale for meal times- hopefully this will work better than what we are using now- this scale is similar but calls for lower amount of insulin at higher sugar readings.   Okay to stay off Victoza.  Continue current lantus at 32 units qhs Change Humalog scale qac as follows-  Blood Glucose (mg/dL)  Breakfast  (Units Humalog Insulin)  Lunch  (Units Humalog Insulin)  Supper  (Units Humalog Insulin)  Nighttime (Units Humalog Insulin)   <70 Treat the low blood sugar.  Recheck blood glucose  in 15 mins. If sugar is more than 70, then take the number of units of insulin in the 70-90 row, if before a meal.   70-90  10  10  10   0  91-130 (Base Dose)  16  16  16   0   131-150  17  17  17   0   151-200  18  18  18   0   201-250  19  19  19  0   251-300  19 19  19  0   301-350 20  20  20 2    351-400  20  20  20  2    401-450  21  21  21 3    >450  21  21  21  4    Lantus insulin 32 units subcutaneous Nightly   [name], 11/28/1931 New insulin scale as of-06/11/14  Dr. Howell Rucks

## 2014-06-12 ENCOUNTER — Encounter: Payer: Self-pay | Admitting: Internal Medicine

## 2014-06-12 NOTE — Telephone Encounter (Signed)
Left message for pt to return my call.

## 2014-06-12 NOTE — Telephone Encounter (Signed)
Pt notified and  verbalized understanding. Mailed new SS to pt per request.

## 2014-06-16 NOTE — Telephone Encounter (Signed)
Unread mychart message mailed to patient 

## 2014-08-13 ENCOUNTER — Other Ambulatory Visit (INDEPENDENT_AMBULATORY_CARE_PROVIDER_SITE_OTHER): Payer: Medicare PPO

## 2014-08-13 ENCOUNTER — Telehealth: Payer: Self-pay | Admitting: *Deleted

## 2014-08-13 DIAGNOSIS — E871 Hypo-osmolality and hyponatremia: Secondary | ICD-10-CM | POA: Diagnosis not present

## 2014-08-13 NOTE — Telephone Encounter (Signed)
Order placed for met b 

## 2014-08-13 NOTE — Telephone Encounter (Signed)
Labs and dX?  

## 2014-08-14 LAB — BASIC METABOLIC PANEL WITH GFR
BUN: 23 mg/dL (ref 6–23)
CO2: 26 meq/L (ref 19–32)
Calcium: 9.5 mg/dL (ref 8.4–10.5)
Chloride: 103 meq/L (ref 96–112)
Creatinine, Ser: 0.85 mg/dL (ref 0.40–1.20)
GFR: 68.15 mL/min
Glucose, Bld: 200 mg/dL — ABNORMAL HIGH (ref 70–99)
Potassium: 4.2 meq/L (ref 3.5–5.1)
Sodium: 139 meq/L (ref 135–145)

## 2014-08-15 ENCOUNTER — Encounter: Payer: Self-pay | Admitting: Internal Medicine

## 2014-08-15 ENCOUNTER — Ambulatory Visit (INDEPENDENT_AMBULATORY_CARE_PROVIDER_SITE_OTHER): Payer: Medicare PPO | Admitting: Internal Medicine

## 2014-08-15 VITALS — BP 104/50 | HR 67 | Temp 98.1°F | Ht 63.5 in | Wt 179.2 lb

## 2014-08-15 DIAGNOSIS — Z1239 Encounter for other screening for malignant neoplasm of breast: Secondary | ICD-10-CM

## 2014-08-15 DIAGNOSIS — Z0000 Encounter for general adult medical examination without abnormal findings: Secondary | ICD-10-CM | POA: Diagnosis not present

## 2014-08-15 DIAGNOSIS — K3184 Gastroparesis: Secondary | ICD-10-CM

## 2014-08-15 DIAGNOSIS — N182 Chronic kidney disease, stage 2 (mild): Secondary | ICD-10-CM

## 2014-08-15 DIAGNOSIS — I10 Essential (primary) hypertension: Secondary | ICD-10-CM

## 2014-08-15 DIAGNOSIS — E1165 Type 2 diabetes mellitus with hyperglycemia: Secondary | ICD-10-CM

## 2014-08-15 DIAGNOSIS — E1122 Type 2 diabetes mellitus with diabetic chronic kidney disease: Secondary | ICD-10-CM

## 2014-08-15 DIAGNOSIS — E7800 Pure hypercholesterolemia, unspecified: Secondary | ICD-10-CM

## 2014-08-15 DIAGNOSIS — E039 Hypothyroidism, unspecified: Secondary | ICD-10-CM

## 2014-08-15 DIAGNOSIS — M4800 Spinal stenosis, site unspecified: Secondary | ICD-10-CM

## 2014-08-15 DIAGNOSIS — K219 Gastro-esophageal reflux disease without esophagitis: Secondary | ICD-10-CM

## 2014-08-15 DIAGNOSIS — E669 Obesity, unspecified: Secondary | ICD-10-CM

## 2014-08-15 DIAGNOSIS — IMO0002 Reserved for concepts with insufficient information to code with codable children: Secondary | ICD-10-CM

## 2014-08-15 MED ORDER — LORAZEPAM 0.5 MG PO TABS: ORAL_TABLET | ORAL | Status: DC

## 2014-08-15 NOTE — Progress Notes (Signed)
Pre visit review using our clinic review tool, if applicable. No additional management support is needed unless otherwise documented below in the visit note. 

## 2014-08-15 NOTE — Progress Notes (Signed)
Patient ID: Marissa Huff, female   DOB: Jul 06, 1931, 81 y.o.   MRN: [id number]   Subjective:    Patient ID: Marissa Huff, female    DOB: Nov 23, 1931, 81 y.o.   MRN: [id number]  HPI  Patient here to follow up on her current medical issues as well as for a complete physical exam.  Is now seeing Dr Eddie Dibbles for her diabetes.  Was evaluated two weeks ago and again yesterday.  She reviewed her sugars and did not recommend any change in medication.  Had the one episode of low sugars, but no other significant low sugars.  Tries to stay active.  No cardiac symptoms with increased activity or exertion.  No sob.  No increased cough or congestion.  No acid reflux reported.  Bowels stable.  Up to date with eye exams.  Sees Dr Chong Sicilian.  Persistent lesion - right anterior chest - Dr Kellie Moor.  Discussed diet and exercise.     Past Medical History  Diagnosis Date  . Diabetes mellitus without complication   . Hypothyroidism   . GERD (gastroesophageal reflux disease)   . Hypertension   . Gastric lymphoma   . Allergy   . Spinal stenosis     lumbar laminectomy  . PUD (peptic ulcer disease)   . Gastroparesis   . Diverticulosis   . Carpal tunnel syndrome, bilateral    Family history and social history reviewed.    Outpatient Encounter Prescriptions as of 08/15/2014  Medication Sig  . acetaminophen (TYLENOL) 500 MG tablet Take 500 mg by mouth every 6 (six) hours as needed.  Marland Kitchen albuterol (PROVENTIL HFA;VENTOLIN HFA) 108 (90 BASE) MCG/ACT inhaler Inhale 2 puffs into the lungs every 6 (six) hours as needed for wheezing or shortness of breath.  Marland Kitchen amLODipine (NORVASC) 5 MG tablet Take 1 tablet (5 mg total) by mouth daily.  . Blood Glucose Monitoring Suppl (ACCU-CHEK AVIVA) device Check sugar 4 times daily Dx E11.8  . Cholecalciferol (VITAMIN D-3 PO) Take 1 capsule by mouth daily.  Marland Kitchen DOXYLAMINE SUCCINATE, SLEEP, PO Take by mouth.  . fexofenadine (ALLEGRA) 180 MG tablet Take 180 mg by mouth daily.  .  fluticasone (FLONASE) 50 MCG/ACT nasal spray Place 2 sprays into both nostrils daily.  Marland Kitchen glucagon (GLUCAGON EMERGENCY) 1 MG injection Inject 1 mg into the muscle once as needed.  Marland Kitchen glucose blood (ACCU-CHEK AVIVA) test strip Check sugar 4 times daily Dx E11.8  . imipramine (TOFRANIL) 25 MG tablet Take 25 mg by mouth at bedtime.  . insulin glargine (LANTUS) 100 UNIT/ML injection Inject 0.33 mLs (33 Units total) into the skin at bedtime. Pens please  - SOLOSTAR  . insulin lispro (HUMALOG KWIKPEN) 100 UNIT/ML KiwkPen Inject on a sliding scale between 14-28 units with each meal. Max daily dose 90  . Lancets (ACCU-CHEK SOFT TOUCH) lancets Use as instructed. Dx E11.8  . Liraglutide 18 MG/3ML SOPN Inject 1.8 mg [place] daily  . LORazepam (ATIVAN) 0.5 MG tablet 1/2 tablet q day prn  . metFORMIN (GLUCOPHAGE-XR) 500 MG 24 hr tablet Take 500 mg by mouth 2 (two) times daily.  . Multiple Vitamins-Minerals (MULTIVITAMIN PO) Take by mouth daily.   . Multiple Vitamins-Minerals (PRESERVISION AREDS PO) Take by mouth 2 (two) times daily.  . pantoprazole (PROTONIX) 40 MG tablet Take 1 tablet (40 mg total) by mouth 2 (two) times daily.  Marland Kitchen SYNTHROID 175 MCG tablet TAKE 1 TABLET (175 MCG TOTAL) BY MOUTH DAILY BEFORE BREAKFAST.  Marland Kitchen traMADol (ULTRAM) 50 MG tablet 1  to 2 tabs every 4 to six hours  . valsartan-hydrochlorothiazide (DIOVAN-HCT) 160-12.5 MG per tablet Take 1 tablet by mouth daily.  . [DISCONTINUED] LORazepam (ATIVAN) 0.5 MG tablet 1/2 tablet q day prn   No facility-administered encounter medications on file as of 08/15/2014.    Review of Systems  Constitutional: Negative for appetite change and unexpected weight change.  HENT: Negative for congestion and sinus pressure.   Eyes: Negative for pain and visual disturbance.  Respiratory: Negative for cough, chest tightness and shortness of breath.   Cardiovascular: Negative for chest pain, palpitations and leg swelling.  Gastrointestinal: Negative for nausea,  vomiting, abdominal pain and diarrhea.  Genitourinary: Negative for dysuria and difficulty urinating.  Musculoskeletal: Negative for back pain and joint swelling.  Skin: Negative for color change and rash.  Neurological: Negative for dizziness, light-headedness and headaches.  Hematological: Negative for adenopathy. Does not bruise/bleed easily.  Psychiatric/Behavioral: Negative for dysphoric mood and agitation.       Objective:     Blood pressure recheck:  124/62  Physical Exam  Constitutional: She is oriented to person, place, and time. She appears well-developed and well-nourished.  HENT:  Nose: Nose normal.  Mouth/Throat: Oropharynx is clear and moist.  Eyes: Right eye exhibits no discharge. Left eye exhibits no discharge. No scleral icterus.  Neck: Neck supple. No thyromegaly present.  Cardiovascular: Normal rate and regular rhythm.   Pulmonary/Chest: Breath sounds normal. No accessory muscle usage. No tachypnea. No respiratory distress. She has no decreased breath sounds. She has no wheezes. She has no rhonchi. Right breast exhibits no inverted nipple, no mass, no nipple discharge and no tenderness (no axillary adenopathy). Left breast exhibits no inverted nipple, no mass, no nipple discharge and no tenderness (no axilarry adenopathy).  Abdominal: Soft. Bowel sounds are normal. There is no tenderness.  Musculoskeletal: She exhibits no edema or tenderness.  Lymphadenopathy:    She has no cervical adenopathy.  Neurological: She is alert and oriented to person, place, and time.  Skin: Skin is warm. No rash noted.  Psychiatric: She has a normal mood and affect. Her behavior is normal.    BP 104/50 mmHg  Pulse 67  Temp(Src) 98.1 F (36.7 C) (Oral)  Ht 5' 3.5" (1.613 m)  Wt 179 lb 4 oz (81.307 kg)  BMI 31.25 kg/m2  SpO2 100% Wt Readings from Last 3 Encounters:  08/15/14 179 lb 4 oz (81.307 kg)  05/28/14 180 lb (81.647 kg)  05/08/14 180 lb 2 oz (81.704 kg)     Lab  Results  Component Value Date   WBC 4.8 03/03/2014   HGB 12.5 03/03/2014   HCT 36.4 03/03/2014   PLT 290.0 03/03/2014   GLUCOSE 200* 08/13/2014   CHOL 123 06/11/2014   TRIG 58.0 06/11/2014   HDL 46.00 06/11/2014   LDLCALC 65 06/11/2014   ALT 13 06/11/2014   AST 14 06/11/2014   NA 139 08/13/2014   K 4.2 08/13/2014   CL 103 08/13/2014   CREATININE 0.85 08/13/2014   BUN 23 08/13/2014   CO2 26 08/13/2014   TSH 0.91 06/11/2014   HGBA1C 7.2* 06/11/2014   MICROALBUR 0.9 06/11/2014       Assessment & Plan:   Problem List Items Addressed This Visit    CKD stage 2 due to type 2 diabetes mellitus    Follow met b.  Stay hydrated.        Relevant Medications   metFORMIN (GLUCOPHAGE-XR) 500 MG 24 hr tablet   Gastroparesis  Sees GI.  Stable.        GERD (gastroesophageal reflux disease)    On protonix.  No upper symptoms reported.        Health care maintenance    Physical 05/08/14.  Mammogram 07/18/13.  Schedule f/u mammogram.  Saw Dr Candace Cruise.  Felt no further w/up warranted.        Hypercholesterolemia    Low cholesterol diet and exercise.  Follow lipid panel.        Relevant Orders   Lipid panel   Hepatic function panel   Hypertension    Blood pressure under good control.  Continue same medication regimen.  Follow pressures.  Follow metabolic panel.        Hypothyroidism    On thyroid replacement.  Follow tsh.        Obesity (BMI 30-39.9)    Discussed diet and exercise.        Relevant Medications   metFORMIN (GLUCOPHAGE-XR) 500 MG 24 hr tablet   Spinal stenosis    Seeing Dr Sharlet Salina.  On tramadol and has muscle relaxer if needed.  Stable.        Type 2 diabetes mellitus, uncontrolled    Now seeing Dr Eddie Dibbles.  Sugars attached.  Just saw Dr Eddie Dibbles yesterday.  No changes made in her medication.  Follow.  Saw Dr Chong Sicilian 03/2014.  Follow met b and a1c.        Relevant Medications   metFORMIN (GLUCOPHAGE-XR) 500 MG 24 hr tablet   Other Relevant Orders   Basic metabolic  panel    Other Visit Diagnoses    Screening breast examination    -  Primary    Relevant Orders    MM DIGITAL SCREENING BILATERAL        Einar Pheasant, MD

## 2014-08-17 ENCOUNTER — Encounter: Payer: Self-pay | Admitting: Internal Medicine

## 2014-08-17 NOTE — Assessment & Plan Note (Signed)
Follow met b.  Stay hydrated.   

## 2014-08-17 NOTE — Assessment & Plan Note (Signed)
On thyroid replacement.  Follow tsh.  

## 2014-08-17 NOTE — Assessment & Plan Note (Signed)
Low cholesterol diet and exercise.  Follow lipid panel.   

## 2014-08-17 NOTE — Assessment & Plan Note (Signed)
Discussed diet and exercise 

## 2014-08-17 NOTE — Assessment & Plan Note (Signed)
Sees GI.  Stable.

## 2014-08-17 NOTE — Assessment & Plan Note (Signed)
Now seeing Dr Eddie Dibbles.  Sugars attached.  Just saw Dr Eddie Dibbles yesterday.  No changes made in her medication.  Follow.  Saw Dr Chong Sicilian 03/2014.  Follow met b and a1c.

## 2014-08-17 NOTE — Assessment & Plan Note (Signed)
Blood pressure under good control.  Continue same medication regimen.  Follow pressures.  Follow metabolic panel.   

## 2014-08-17 NOTE — Assessment & Plan Note (Signed)
On protonix.  No upper symptoms reported.   

## 2014-08-17 NOTE — Assessment & Plan Note (Signed)
Seeing Dr Sharlet Salina.  On tramadol and has muscle relaxer if needed.  Stable.

## 2014-08-17 NOTE — Assessment & Plan Note (Signed)
Physical 05/08/14.  Mammogram 07/18/13.  Schedule f/u mammogram.  Saw Dr Candace Cruise.  Felt no further w/up warranted.

## 2014-08-18 ENCOUNTER — Telehealth: Payer: Self-pay | Admitting: Internal Medicine

## 2014-08-18 NOTE — Telephone Encounter (Signed)
Schedule a f/u appt in 10 weeks (57min appt).  Pt may have not stopped on her way out.  I always type it in the box - can pull up note and see when f/u is supposed to be  Thanks

## 2014-08-18 NOTE — Telephone Encounter (Signed)
Pt called to sch her follow up appt, However pt does not remember when it is and does not see it on her papers from her visit.  Thank You!

## 2014-08-22 ENCOUNTER — Other Ambulatory Visit: Payer: Self-pay | Admitting: Internal Medicine

## 2014-08-22 ENCOUNTER — Ambulatory Visit
Admission: RE | Admit: 2014-08-22 | Discharge: 2014-08-22 | Disposition: A | Discharge status: Home / Self Care | Payer: Medicare PPO | Source: Ambulatory Visit | Attending: Internal Medicine | Admitting: Internal Medicine

## 2014-08-22 DIAGNOSIS — Z1231 Encounter for screening mammogram for malignant neoplasm of breast: Secondary | ICD-10-CM | POA: Insufficient documentation

## 2014-08-22 DIAGNOSIS — Z1239 Encounter for other screening for malignant neoplasm of breast: Secondary | ICD-10-CM

## 2014-08-22 HISTORY — DX: Unspecified malignant neoplasm of skin, unspecified: C44.90

## 2014-08-28 ENCOUNTER — Ambulatory Visit: Payer: Medicare PPO | Admitting: Endocrinology

## 2014-09-10 ENCOUNTER — Ambulatory Visit
Admission: EM | Admit: 2014-09-10 | Discharge: 2014-09-10 | Disposition: A | Discharge status: Home / Self Care | Payer: Medicare PPO | Attending: Family Medicine | Admitting: Family Medicine

## 2014-09-10 ENCOUNTER — Encounter: Payer: Self-pay | Admitting: Emergency Medicine

## 2014-09-10 DIAGNOSIS — J069 Acute upper respiratory infection, unspecified: Secondary | ICD-10-CM

## 2014-09-10 MED ORDER — AZITHROMYCIN 250 MG PO TABS: ORAL_TABLET | ORAL | Status: DC

## 2014-09-10 MED ORDER — HYDROCOD POLST-CPM POLST ER 10-8 MG/5ML PO SUER: ORAL | Status: DC

## 2014-09-10 NOTE — ED Notes (Signed)
Pt with cough x 2 days

## 2014-09-10 NOTE — ED Provider Notes (Signed)
SUBJECTIVE:  Marissa Huff is a 81 y.o. female who complains of sore throat, nasal congestion, productive cough for the last few days. Denies fever, CP, SOB, N/V/D, abdominal pain, severe headache. Has tried OTC Medication without much relief.   ROS: Negative except mentioned above.   OBJECTIVE: She appears well, vital signs are as noted.  General: NAD HEENT: mild pharyngeal erythema, no exudate, no erythema of TMs, no cervical LAD Respiratory: CTA B Cardiology: RRR  ASSESSMENT:  URI  PLAN: Z-pk, Tussionex per patient request, takes allergy shots, monitor blood sugars closely, rest, hydration, hydration, seek medical attention if symptoms persist or worsen.        Paulina Fusi, MD 09/10/14 1309

## 2014-09-22 ENCOUNTER — Ambulatory Visit
Admission: EM | Admit: 2014-09-22 | Discharge: 2014-09-22 | Disposition: A | Discharge status: Home / Self Care | Payer: Medicare PPO | Attending: Family Medicine | Admitting: Family Medicine

## 2014-09-22 ENCOUNTER — Encounter: Payer: Self-pay | Admitting: Emergency Medicine

## 2014-09-22 ENCOUNTER — Ambulatory Visit: Payer: Medicare PPO

## 2014-09-22 DIAGNOSIS — J0110 Acute frontal sinusitis, unspecified: Secondary | ICD-10-CM

## 2014-09-22 DIAGNOSIS — J40 Bronchitis, not specified as acute or chronic: Secondary | ICD-10-CM

## 2014-09-22 DIAGNOSIS — J0100 Acute maxillary sinusitis, unspecified: Secondary | ICD-10-CM

## 2014-09-22 MED ORDER — GUAIFENESIN-CODEINE 100-10 MG/5ML PO SOLN: 5.0000 mL | Freq: Four times a day (QID) | ORAL | Status: DC | PRN

## 2014-09-22 MED ORDER — LEVOFLOXACIN 500 MG PO TABS: 500.0000 mg | ORAL_TABLET | Freq: Every day | ORAL | Status: DC

## 2014-09-22 NOTE — ED Provider Notes (Signed)
[facility] Emergency Department Provider Note  ____________________________________________  Time seen: Approximately 1:16 PM  I have reviewed the triage vital signs and the nursing notes.   HISTORY  Chief Complaint Cough  HPI Maliaka Brasington is a 81 y.o. female presents for complaints of 2-3 weeks of runny nose, congestion and cough. Reports started out with mostly runny nose similar to her seasonal allergies but has since continued and worsened. Reports continues to eat and drink well. Reports occasional wheezing and states wheezing only when coughing. States this is resolved with her home nebulizer.  Denies chest pain or shortness of breath. States gets allergies shots for her allergies.   Also reports facial sinus pressure. Reports seen here at urgent 09/10/2014 for same and rx z-pack and tessalon Perles, states this helped but did not resolve. Denies pain. States felt warm with chills, but no fever.   Denies weakness, chest pain, shortness of breath, abdominal pain, dizziness, fever or other complaints.     Past Medical History  Diagnosis Date  . Diabetes mellitus without complication   . Hypothyroidism   . GERD (gastroesophageal reflux disease)   . Hypertension   . Allergy   . Spinal stenosis     lumbar laminectomy  . PUD (peptic ulcer disease)   . Gastroparesis   . Diverticulosis   . Carpal tunnel syndrome, bilateral   . Gastric lymphoma   . Skin cancer     Patient Active Problem List   Diagnosis Date Noted  . Health care maintenance 05/11/2014  . Chronic diarrhea 03/05/2014  . Obesity (BMI 30-39.9) 01/11/2014  . GERD (gastroesophageal reflux disease) 01/11/2014  . Environmental allergies 01/11/2014  . CKD stage 2 due to type 2 diabetes mellitus 10/23/2013  . External nasal lesion 09/08/2013  . Hydronephrosis, bilateral 08/26/2013  . Hand fracture 11/25/2012  . Frequent UTI 01/27/2012  . Gastric lymphoma 12/12/2011  . Spinal stenosis  12/12/2011  . Diverticulosis 12/12/2011  . Gastroparesis 12/12/2011  . Peptic ulcer disease 12/12/2011  . Hypothyroidism 12/12/2011  . Hypercholesterolemia 12/12/2011  . Hypertension 12/12/2011  . Type 2 diabetes mellitus, uncontrolled 12/12/2011    Past Surgical History  Procedure Laterality Date  . Gastric lymphoma  1996    surgery  . Partial hysterectomy  1960    fibroid tumors, ovaries not removed  . Lumbar laminectomy  1999  . Laparoscopic cholecystectomy  6/01  . Carpal tunnel repai  10/10    left   . Arthroscopic left knee  12/05/08  . Antrectomy and vagotomy  1994  . Tonsillectomy    . Cataract extraction, bilateral    . Abdominal hysterectomy      Current Outpatient Rx  Name  Route  Sig  Dispense  Refill  . acetaminophen (TYLENOL) 500 MG tablet   Oral   Take 500 mg by mouth every 6 (six) hours as needed.         Marland Kitchen albuterol (PROVENTIL HFA;VENTOLIN HFA) 108 (90 BASE) MCG/ACT inhaler   Inhalation   Inhale 2 puffs into the lungs every 6 (six) hours as needed for wheezing or shortness of breath.   1 Inhaler   1   . amLODipine (NORVASC) 5 MG tablet   Oral   Take 1 tablet (5 mg total) by mouth daily.   90 tablet   3   . azithromycin (ZITHROMAX Z-PAK) 250 MG tablet      Use as directed for 5 days.   6 each   0   .  Blood Glucose Monitoring Suppl (ACCU-CHEK AVIVA) device      Check sugar 4 times daily Dx E11.8   1 each   0   . chlorpheniramine-HYDROcodone (TUSSIONEX PENNKINETIC ER) 10-8 MG/5ML SUER      2.81mls PO bid prn cough. Can cause drowsiness.   100 mL   0   . Cholecalciferol (VITAMIN D-3 PO)   Oral   Take 1 capsule by mouth daily.         Marland Kitchen DOXYLAMINE SUCCINATE, SLEEP, PO   Oral   Take by mouth.         . fexofenadine (ALLEGRA) 180 MG tablet   Oral   Take 180 mg by mouth daily.         . fluticasone (FLONASE) 50 MCG/ACT nasal spray   Each Nare   Place 2 sprays into both nostrils daily.   48 g   3   . glucagon (GLUCAGON  EMERGENCY) 1 MG injection   Intramuscular   Inject 1 mg into the muscle once as needed.   1 each   12   . glucose blood (ACCU-CHEK AVIVA) test strip      Check sugar 4 times daily Dx E11.8   200 each   5   . imipramine (TOFRANIL) 25 MG tablet   Oral   Take 25 mg by mouth at bedtime.         . insulin glargine (LANTUS) 100 UNIT/ML injection   Subcutaneous   Inject 0.33 mLs (33 Units total) into the skin at bedtime. Pens please  - SOLOSTAR   10 mL   5   . insulin lispro (HUMALOG KWIKPEN) 100 UNIT/ML KiwkPen      Inject on a sliding scale between 14-28 units with each meal. Max daily dose 90   90 mL   3   . Lancets (ACCU-CHEK SOFT TOUCH) lancets      Use as instructed. Dx E11.8   200 each   5   . Liraglutide 18 MG/3ML SOPN      Inject 1.8 mg [place] daily   3 pen   3   . LORazepam (ATIVAN) 0.5 MG tablet      1/2 tablet q day prn   30 tablet   0   . metFORMIN (GLUCOPHAGE-XR) 500 MG 24 hr tablet   Oral   Take 500 mg by mouth 2 (two) times daily.      5   . Multiple Vitamins-Minerals (MULTIVITAMIN PO)   Oral   Take by mouth daily.          . Multiple Vitamins-Minerals (PRESERVISION AREDS PO)   Oral   Take by mouth 2 (two) times daily.         . pantoprazole (PROTONIX) 40 MG tablet   Oral   Take 1 tablet (40 mg total) by mouth 2 (two) times daily.   180 tablet   1   . SYNTHROID 175 MCG tablet      TAKE 1 TABLET (175 MCG TOTAL) BY MOUTH DAILY BEFORE BREAKFAST.   30 tablet   11     Dispense as written.   . traMADol (ULTRAM) 50 MG tablet      1 to 2 tabs every 4 to six hours         . valsartan-hydrochlorothiazide (DIOVAN-HCT) 160-12.5 MG per tablet   Oral   Take 1 tablet by mouth daily.   90 tablet   3     Allergies Penicillins  Family History  Problem Relation Age of Onset  . Stroke Mother   . Stroke Father   . Hypertension Father   . Diabetes Brother   . Breast cancer Daughter 57  . Colon cancer Neg Hx     Social  History Social History  Substance Use Topics  . Smoking status: Never Smoker   . Smokeless tobacco: Never Used  . Alcohol Use: No    Review of Systems Constitutional: No fever/chills Eyes: No visual changes. ENT: positive for cough, congestion, sore throat. Cardiovascular: Denies chest pain. Respiratory: Denies shortness of breath. Gastrointestinal: No abdominal pain.  No nausea, no vomiting.  No diarrhea.  No constipation. Genitourinary: Negative for dysuria. Musculoskeletal: Negative for back pain. Skin: Negative for rash. Neurological: Negative for headaches, focal weakness or numbness.  10-point ROS otherwise negative.  ____________________________________________   PHYSICAL EXAM:  VITAL SIGNS: ED Triage Vitals  Enc Vitals Group     BP 09/22/14 1308 112/92 mmHg     Pulse Rate 09/22/14 1308 74     Resp 09/22/14 1308 17     Temp 09/22/14 1308 98.4 F (36.9 C)     Temp Source 09/22/14 1308 Oral     SpO2 09/22/14 1308 100 %     Weight 09/22/14 1308 181 lb (82.101 kg)     Height 09/22/14 1308 5' 4.5" (1.638 m)     Head Cir --      Peak Flow --      Pain Score --      Pain Loc --      Pain Edu? --      Excl. in [place]? --     Constitutional: Alert and oriented. Well appearing and in no acute distress. Eyes: Conjunctivae are normal. PERRL. EOMI. Head: Atraumatic. Mild TTP bilateral frontal and maxillary sinus TTP.   Ears: no erythema, bilateral fluid present at TMs, no bulging or retraction. No discharge or drainage.   Nose: mild clear rhinorrhea. Bilateral nasal turbinate edema, bilateral nares patent.   Mouth/Throat: Mucous membranes are moist.  Oropharynx non-erythematous. Neck: No stridor.  No cervical spine tenderness to palpation. Hematological/Lymphatic/Immunilogical: No cervical lymphadenopathy. Cardiovascular: Normal rate, regular rhythm. Grossly normal heart sounds.  Good peripheral circulation. Respiratory: Normal respiratory effort.  No retractions. Lungs  CTAB.No wheezes, rales or rhonchi. Good air movement.  Gastrointestinal: Soft and nontender. No distention. Normal Bowel sounds.  No abdominal bruits. No CVA tenderness. Musculoskeletal: No lower or upper extremity tenderness nor edema.  No joint effusions. Bilateral pedal pulses equal and easily palpated.  Neurologic:  Normal speech and language. No gross focal neurologic deficits are appreciated. No gait instability. Skin:  Skin is warm, dry and intact. No rash noted. Psychiatric: Mood and affect are normal. Speech and behavior are normal.  ____________________________________________   LABS (all labs ordered are listed, but only abnormal results are displayed)  Labs Reviewed - No data to display  RADIOLOGY  EXAM: CHEST 2 VIEW  COMPARISON: 06/10/2013  FINDINGS: Heart size and pulmonary vascularity are normal and the lungs are clear. Diffuse degenerative changes in the thoracic spine. No acute abnormalities.  IMPRESSION: No active cardiopulmonary disease.   Electronically Signed By: Lorriane Shire M.D. On: 09/22/2014 13:36      I, Marylene Land, personally viewed and evaluated these images (plain radiographs) as part of my medical decision making.   __________________________________________   INITIAL IMPRESSION / ASSESSMENT AND PLAN / ED COURSE  Pertinent labs & imaging results that were available during my care of the patient were reviewed  by me and considered in my medical decision making (see chart for details).  Very well appearing. No acute distress. Husband at bedside. Presents for complaints of 2-3 weeks of runny nose, congestion, cough. History of seasonal allergies as well as bronchitis. Denies chest pain, shortness of breath, fever, or abdominal pain. Afebrile. Lungs clear throughout. Suspect sinusitis and bronchitis, will evaluate chest xray.   Chest xray negative for acute cardiopulmonary changes. Will treat acute on chronic bronchitis and frontal  and maxillary sinusitis with levaquin 500mg  x 7 days, prn guaifenesin/codeine and discussed supportive treatments including rest, fluids, monitoring blood sugars and follow up with PCP. Follow up with PCP this week. Discussed follow up and return parameters. Discussed follow up with Primary care physician this week. Discussed follow up and return parameters including no resolution or any worsening concerns. Patient verbalized understanding and agreed to plan.   ____________________________________________   FINAL CLINICAL IMPRESSION(S) / ED DIAGNOSES  Final diagnoses:  Acute frontal sinusitis, recurrence not specified  Acute maxillary sinusitis, recurrence not specified  Bronchitis       Marylene Land, NP 09/22/14 1452

## 2014-09-22 NOTE — ED Notes (Signed)
Patient c/o cough and chest congestion for 3 weeks.  Patient denies fevers.  

## 2014-09-22 NOTE — Discharge Instructions (Signed)
Take medication as prescribed. Rest. Drink and eat regularly.Drink plenty of water.  Monitor your blood sugars closely.   Follow up with your primary care physician closely this week.   Return to Urgent care for new or worsening concerns.   Sinusitis Sinusitis is redness, soreness, and puffiness (inflammation) of the air pockets in the bones of your face (sinuses). The redness, soreness, and puffiness can cause air and mucus to get trapped in your sinuses. This can allow germs to grow and cause an infection.  HOME CARE   Drink enough fluids to keep your pee (urine) clear or pale yellow.  Use a humidifier in your home.  Run a hot shower to create steam in the bathroom. Sit in the bathroom with the door closed. Breathe in the steam 3-4 times a day.  Put a warm, moist washcloth on your face 3-4 times a day, or as told by your doctor.  Use salt water sprays (saline sprays) to wet the thick fluid in your nose. This can help the sinuses drain.  Only take medicine as told by your doctor. GET HELP RIGHT AWAY IF:   Your pain gets worse.  You have very bad headaches.  You are sick to your stomach (nauseous).  You throw up (vomit).  You are very sleepy (drowsy) all the time.  Your face is puffy (swollen).  Your vision changes.  You have a stiff neck.  You have trouble breathing. MAKE SURE YOU:   Understand these instructions.  Will watch your condition.  Will get help right away if you are not doing well or get worse. Document Released: 06/08/2007 Document Revised: 09/14/2011 Document Reviewed: 07/26/2011 [facility] Patient Information 2015 [place], [place]. This information is not intended to replace advice given to you by your health care provider. Make sure you discuss any questions you have with your health care provider.

## 2014-10-01 ENCOUNTER — Other Ambulatory Visit: Payer: Self-pay | Admitting: Endocrinology

## 2014-10-27 ENCOUNTER — Encounter: Payer: Self-pay | Admitting: Internal Medicine

## 2014-10-27 ENCOUNTER — Ambulatory Visit (INDEPENDENT_AMBULATORY_CARE_PROVIDER_SITE_OTHER): Payer: Medicare PPO | Admitting: Internal Medicine

## 2014-10-27 ENCOUNTER — Other Ambulatory Visit: Payer: Self-pay

## 2014-10-27 VITALS — BP 110/60 | HR 73 | Temp 97.9°F | Resp 18 | Ht 63.5 in | Wt 175.8 lb

## 2014-10-27 DIAGNOSIS — C8593 Non-Hodgkin lymphoma, unspecified, intra-abdominal lymph nodes: Secondary | ICD-10-CM | POA: Diagnosis not present

## 2014-10-27 DIAGNOSIS — Z794 Long term (current) use of insulin: Secondary | ICD-10-CM

## 2014-10-27 DIAGNOSIS — M4800 Spinal stenosis, site unspecified: Secondary | ICD-10-CM

## 2014-10-27 DIAGNOSIS — Z23 Encounter for immunization: Secondary | ICD-10-CM | POA: Diagnosis not present

## 2014-10-27 DIAGNOSIS — C8599 Non-Hodgkin lymphoma, unspecified, extranodal and solid organ sites: Secondary | ICD-10-CM

## 2014-10-27 DIAGNOSIS — E1122 Type 2 diabetes mellitus with diabetic chronic kidney disease: Secondary | ICD-10-CM

## 2014-10-27 DIAGNOSIS — I10 Essential (primary) hypertension: Secondary | ICD-10-CM | POA: Diagnosis not present

## 2014-10-27 DIAGNOSIS — K219 Gastro-esophageal reflux disease without esophagitis: Secondary | ICD-10-CM | POA: Diagnosis not present

## 2014-10-27 DIAGNOSIS — E1165 Type 2 diabetes mellitus with hyperglycemia: Secondary | ICD-10-CM

## 2014-10-27 DIAGNOSIS — E039 Hypothyroidism, unspecified: Secondary | ICD-10-CM

## 2014-10-27 DIAGNOSIS — E669 Obesity, unspecified: Secondary | ICD-10-CM

## 2014-10-27 DIAGNOSIS — IMO0001 Reserved for inherently not codable concepts without codable children: Secondary | ICD-10-CM

## 2014-10-27 DIAGNOSIS — E7800 Pure hypercholesterolemia, unspecified: Secondary | ICD-10-CM

## 2014-10-27 DIAGNOSIS — N182 Chronic kidney disease, stage 2 (mild): Secondary | ICD-10-CM

## 2014-10-27 DIAGNOSIS — K3184 Gastroparesis: Secondary | ICD-10-CM

## 2014-10-27 MED ORDER — SYNTHROID 175 MCG PO TABS: ORAL_TABLET | ORAL | Status: DC

## 2014-10-27 MED ORDER — LORAZEPAM 0.5 MG PO TABS: ORAL_TABLET | ORAL | Status: DC

## 2014-10-27 NOTE — Patient Instructions (Signed)

## 2014-10-27 NOTE — Progress Notes (Signed)
Pre-visit discussion using our clinic review tool. No additional management support is needed unless otherwise documented below in the visit note.  

## 2014-10-27 NOTE — Progress Notes (Signed)
Patient ID: Marissa Huff, female   DOB: 1931/07/12, 81 y.o.   MRN: [id number]   Subjective:    Patient ID: Marissa Huff, female    DOB: [birth year], 81 y.o.   MRN: [id number]  HPI  Patient with past history of diabetes, hypothyroidism, GERD and hypertension who comes in today to follow up on these issues.  She is seeing Dr Eddie Dibbles for her diabetes.  States am sugars averaging 130s.  Discussed staying active.  No cardiac symptoms with increased activity or exertion.  No sob.  Breathing stable.  No abdominal pain or cramping.  Bowels stable.     Past Medical History  Diagnosis Date  . Diabetes mellitus without complication ([place])   . Hypothyroidism   . GERD (gastroesophageal reflux disease)   . Hypertension   . Allergy   . Spinal stenosis     lumbar laminectomy  . PUD (peptic ulcer disease)   . Gastroparesis   . Diverticulosis   . Carpal tunnel syndrome, bilateral   . 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 Skin cancer    Past Surgical History  Procedure Laterality Date  . Gastric lymphoma  1996    surgery  . Partial hysterectomy  1960    fibroid tumors, ovaries not removed  . Lumbar laminectomy  1999  . Laparoscopic cholecystectomy  6/01  . Carpal tunnel repai  10/10    left   . Arthroscopic left knee  12/05/08  . Antrectomy and vagotomy  1994  . Tonsillectomy    . Cataract extraction, bilateral    . Abdominal hysterectomy     Family History  Problem Relation Age of Onset  . Stroke Mother   . Stroke Father   . Hypertension Father   . Diabetes Brother   . Breast cancer Daughter 38  . Colon cancer Neg Hx    Social History   Social History  . Marital Status: Married    Spouse Name: N/A  . Number of Children: N/A  . Years of Education: N/A   Social History Main Topics  . Smoking status: Never Smoker   . Smokeless tobacco: Never Used  . Alcohol Use: No  . Drug Use: No  . Sexual Activity: Not Asked   Other Topics Concern  . None   Social History Narrative     Outpatient Encounter Prescriptions as of 10/27/2014  Medication Sig  . acetaminophen (TYLENOL) 500 MG tablet Take 500 mg by mouth every 6 (six) hours as needed.  Marland Kitchen albuterol (PROVENTIL HFA;VENTOLIN HFA) 108 (90 BASE) MCG/ACT inhaler Inhale 2 puffs into the lungs every 6 (six) hours as needed for wheezing or shortness of breath.  Marland Kitchen amLODipine (NORVASC) 5 MG tablet Take 1 tablet (5 mg total) by mouth daily.  . Blood Glucose Monitoring Suppl (ACCU-CHEK AVIVA) device Check sugar 4 times daily Dx E11.8  . Cholecalciferol (VITAMIN D-3 PO) Take 1 capsule by mouth daily.  Marland Kitchen DOXYLAMINE SUCCINATE, SLEEP, PO Take by mouth.  . fexofenadine (ALLEGRA) 180 MG tablet Take 180 mg by mouth daily.  . fluticasone (FLONASE) 50 MCG/ACT nasal spray Place 2 sprays into both nostrils daily.  Marland Kitchen glucagon (GLUCAGON EMERGENCY) 1 MG injection Inject 1 mg into the muscle once as needed.  Marland Kitchen glucose blood (ACCU-CHEK AVIVA) test strip Check sugar 4 times daily Dx E11.8  . imipramine (TOFRANIL) 25 MG tablet Take 25 mg by mouth at bedtime.  . insulin glargine (LANTUS) 100 UNIT/ML injection Inject 0.33 mLs (33 Units total) into the skin  at bedtime. Pens please  - SOLOSTAR  . insulin lispro (HUMALOG KWIKPEN) 100 UNIT/ML KiwkPen Inject on a sliding scale between 14-28 units with each meal. Max daily dose 90  . Lancets (ACCU-CHEK SOFT TOUCH) lancets Use as instructed. Dx E11.8  . LORazepam (ATIVAN) 0.5 MG tablet 1/2 tablet q day prn  . metFORMIN (GLUCOPHAGE-XR) 500 MG 24 hr tablet Take 500 mg by mouth 2 (two) times daily.  . Multiple Vitamins-Minerals (MULTIVITAMIN PO) Take by mouth daily.   . Multiple Vitamins-Minerals (PRESERVISION AREDS PO) Take by mouth 2 (two) times daily.  . traMADol (ULTRAM) 50 MG tablet 1 to 2 tabs every 4 to six hours  . valsartan-hydrochlorothiazide (DIOVAN-HCT) 160-12.5 MG per tablet Take 1 tablet by mouth daily.  . [DISCONTINUED] guaiFENesin-codeine 100-10 MG/5ML syrup Take 5 mLs by mouth every 6  (six) hours as needed for cough.  . [DISCONTINUED] levofloxacin (LEVAQUIN) 500 MG tablet Take 1 tablet (500 mg total) by mouth daily.  . [DISCONTINUED] Liraglutide 18 MG/3ML SOPN Inject 1.8 mg [place] daily  . [DISCONTINUED] LORazepam (ATIVAN) 0.5 MG tablet 1/2 tablet q day prn  . [DISCONTINUED] pantoprazole (PROTONIX) 40 MG tablet Take 1 tablet (40 mg total) by mouth 2 (two) times daily.  . [DISCONTINUED] SYNTHROID 175 MCG tablet TAKE 1 TABLET (175 MCG TOTAL) BY MOUTH DAILY BEFORE BREAKFAST.   No facility-administered encounter medications on file as of 10/27/2014.    Review of Systems  Constitutional: Negative for appetite change and unexpected weight change.  HENT: Positive for sinus pressure. Negative for congestion.   Respiratory: Negative for cough, chest tightness and shortness of breath.   Cardiovascular: Negative for chest pain, palpitations and leg swelling.  Gastrointestinal: Negative for nausea, vomiting, abdominal pain and diarrhea.  Genitourinary: Negative for dysuria and difficulty urinating.  Musculoskeletal: Negative for back pain and joint swelling.  Skin: Negative for color change and rash.  Neurological: Negative for dizziness, light-headedness and headaches.  Psychiatric/Behavioral: Negative for dysphoric mood and agitation.       Objective:     Blood pressure rechecked by me:  122/64  Physical Exam  Constitutional: She appears well-developed and well-nourished. No distress.  HENT:  Nose: Nose normal.  Mouth/Throat: Oropharynx is clear and moist.  Eyes: Conjunctivae are normal. Right eye exhibits no discharge. Left eye exhibits no discharge.  Neck: Neck supple. No thyromegaly present.  Cardiovascular: Normal rate and regular rhythm.   Pulmonary/Chest: Breath sounds normal. No respiratory distress. She has no wheezes.  Abdominal: Soft. Bowel sounds are normal. There is no tenderness.  Musculoskeletal: She exhibits no edema or tenderness.  Lymphadenopathy:    She  has no cervical adenopathy.  Skin: No rash noted. No erythema.  Psychiatric: She has a normal mood and affect. Her behavior is normal.    BP 110/60 mmHg  Pulse 73  Temp(Src) 97.9 F (36.6 C) (Oral)  Resp 18  Ht 5' 3.5" (1.613 m)  Wt 175 lb 12 oz (79.72 kg)  BMI 30.64 kg/m2  SpO2 97% Wt Readings from Last 3 Encounters:  10/27/14 175 lb 12 oz (79.72 kg)  09/22/14 181 lb (82.101 kg)  09/10/14 180 lb (81.647 kg)     Lab Results  Component Value Date   WBC 4.8 03/03/2014   HGB 12.5 03/03/2014   HCT 36.4 03/03/2014   PLT 290.0 03/03/2014   GLUCOSE 200* 08/13/2014   CHOL 123 06/11/2014   TRIG 58.0 06/11/2014   HDL 46.00 06/11/2014   LDLCALC 65 06/11/2014   ALT 13 06/11/2014  AST 14 06/11/2014   NA 139 08/13/2014   K 4.2 08/13/2014   CL 103 08/13/2014   CREATININE 0.85 08/13/2014   BUN 23 08/13/2014   CO2 26 08/13/2014   TSH 0.91 06/11/2014   HGBA1C 7.2* 06/11/2014   MICROALBUR 0.9 06/11/2014    Dg Chest 2 View  09/22/2014  CLINICAL DATA:  Productive cough and chest congestion and shortness of breath. EXAM: CHEST  2 VIEW COMPARISON:  06/10/2013 FINDINGS: Heart size and pulmonary vascularity are normal and the lungs are clear. Diffuse degenerative changes in the thoracic spine. No acute abnormalities. IMPRESSION: No active cardiopulmonary disease. Electronically Signed   By: Lorriane Shire M.D.   On: 09/22/2014 13:36       Assessment & Plan:   Problem List Items Addressed This Visit    CKD stage 2 due to type 2 diabetes mellitus ([place])    Stay hydrated.  Follow met b.       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  Follow.  No upper symptoms.        Relevant Medications   LORazepam (ATIVAN) 0.5 MG tablet   Gastroparesis    Followed by GI.  Stable.        GERD (gastroesophageal reflux disease)    On protonix.  No upper symptoms.        Hypercholesterolemia    Low cholesterol diet and exercise.  Follow lipid panel.         Hypertension    Blood pressure under good control.  Continue same medication regimen.  Follow pressures.  Follow metabolic panel.        Hypothyroidism    On thyroid replacement.  Follow tsh.       Obesity (BMI 30-39.9)    Diet and exercise.  Follow.        Spinal stenosis    Seeing Dr Sharlet [place]ina.  Follow.  Stable.        Type 2 diabetes mellitus, uncontrolled ([facility])    Seeing Dr Eddie Dibbles.  Sugars as outlined.  Saw Dr Chong Sicilian 03/2014.  Follow met b and a1c.         Other Visit Diagnoses    Encounter for immunization    -  Primary      I spent 25 minutes with the patient and more than 50% of the time was spent in consultation regarding the above.     Einar Pheasant, MD

## 2014-10-29 ENCOUNTER — Other Ambulatory Visit: Payer: Self-pay | Admitting: Internal Medicine

## 2014-11-02 ENCOUNTER — Encounter: Payer: Self-pay | Admitting: Internal Medicine

## 2014-11-02 NOTE — Assessment & Plan Note (Signed)
S/p gastrectomy 1994 for lymphoma.  Follow.  No upper symptoms.

## 2014-11-02 NOTE — Assessment & Plan Note (Signed)
Seeing Dr Eddie Dibbles.  Sugars as outlined.  Saw Dr Chong Sicilian 03/2014.  Follow met b and a1c.

## 2014-11-02 NOTE — Assessment & Plan Note (Signed)
Seeing Dr Sharlet Salina.  Follow.  Stable.

## 2014-11-02 NOTE — Assessment & Plan Note (Signed)
On protonix.  No upper symptoms.   

## 2014-11-02 NOTE — Assessment & Plan Note (Signed)
Blood pressure under good control.  Continue same medication regimen.  Follow pressures.  Follow metabolic panel.   

## 2014-11-02 NOTE — Assessment & Plan Note (Signed)
Diet and exercise.  Follow.  

## 2014-11-02 NOTE — Assessment & Plan Note (Signed)
Low cholesterol diet and exercise.  Follow lipid panel.   

## 2014-11-02 NOTE — Assessment & Plan Note (Signed)
Stay hydrated.  Follow met b.

## 2014-11-02 NOTE — Assessment & Plan Note (Signed)
Followed by GI.  Stable.   

## 2014-11-02 NOTE — Assessment & Plan Note (Signed)
On thyroid replacement.  Follow tsh.  

## 2014-11-10 ENCOUNTER — Ambulatory Visit: Payer: Medicare PPO

## 2014-11-11 ENCOUNTER — Ambulatory Visit (INDEPENDENT_AMBULATORY_CARE_PROVIDER_SITE_OTHER): Payer: Medicare PPO

## 2014-11-11 DIAGNOSIS — IMO0002 Reserved for concepts with insufficient information to code with codable children: Secondary | ICD-10-CM

## 2014-11-11 DIAGNOSIS — Z23 Encounter for immunization: Secondary | ICD-10-CM | POA: Diagnosis not present

## 2014-11-11 DIAGNOSIS — E1165 Type 2 diabetes mellitus with hyperglycemia: Secondary | ICD-10-CM

## 2014-11-11 DIAGNOSIS — E7800 Pure hypercholesterolemia, unspecified: Secondary | ICD-10-CM

## 2014-11-11 LAB — LIPID PANEL
Cholesterol: 125 mg/dL (ref 0–200)
HDL: 39 mg/dL — ABNORMAL LOW (ref >39.00)
LDL Cholesterol: 73 mg/dL (ref 0–99)
NonHDL: 85.74
Total CHOL/HDL Ratio: 3
Triglycerides: 66 mg/dL (ref 0.0–149.0)
VLDL: 13.2 mg/dL (ref 0.0–40.0)

## 2014-11-11 LAB — HEPATIC FUNCTION PANEL
ALT: 14 U/L (ref 0–35)
AST: 14 U/L (ref 0–37)
Albumin: 3.9 g/dL (ref 3.5–5.2)
Alkaline Phosphatase: 88 U/L (ref 39–117)
Bilirubin, Direct: 0.1 mg/dL (ref 0.0–0.3)
Total Bilirubin: 0.5 mg/dL (ref 0.2–1.2)
Total Protein: 6.7 g/dL (ref 6.0–8.3)

## 2014-11-11 LAB — BASIC METABOLIC PANEL
BUN: 21 mg/dL (ref 6–23)
CO2: 26 mEq/L (ref 19–32)
Calcium: 9.3 mg/dL (ref 8.4–10.5)
Chloride: 102 mEq/L (ref 96–112)
Creatinine, Ser: 0.74 mg/dL (ref 0.40–1.20)
GFR: 79.92 mL/min (ref >60.00)
Glucose, Bld: 226 mg/dL — ABNORMAL HIGH (ref 70–99)
Potassium: 4 mEq/L (ref 3.5–5.1)
Sodium: 137 mEq/L (ref 135–145)

## 2014-11-11 NOTE — Progress Notes (Signed)
Patient came in for Prevnar injection.  REceived in left deltoid.  Patient tolerated well.

## 2014-11-13 ENCOUNTER — Encounter: Payer: Self-pay | Admitting: Internal Medicine

## 2014-12-02 ENCOUNTER — Ambulatory Visit (INDEPENDENT_AMBULATORY_CARE_PROVIDER_SITE_OTHER): Payer: Medicare PPO | Admitting: Family Medicine

## 2014-12-02 ENCOUNTER — Encounter: Payer: Self-pay | Admitting: Family Medicine

## 2014-12-02 VITALS — BP 122/60 | HR 99 | Temp 98.1°F | Resp 18 | Ht 63.5 in | Wt 173.2 lb

## 2014-12-02 DIAGNOSIS — R05 Cough: Secondary | ICD-10-CM | POA: Diagnosis not present

## 2014-12-02 DIAGNOSIS — N3001 Acute cystitis with hematuria: Secondary | ICD-10-CM | POA: Diagnosis not present

## 2014-12-02 DIAGNOSIS — R059 Cough, unspecified: Secondary | ICD-10-CM

## 2014-12-02 DIAGNOSIS — N390 Urinary tract infection, site not specified: Secondary | ICD-10-CM | POA: Insufficient documentation

## 2014-12-02 DIAGNOSIS — R053 Chronic cough: Secondary | ICD-10-CM | POA: Insufficient documentation

## 2014-12-02 LAB — POCT URINALYSIS DIPSTICK
Bilirubin, UA: NEGATIVE
Glucose, UA: 500
Ketones, UA: NEGATIVE
Nitrite, UA: POSITIVE
Protein, UA: 100
Spec Grav, UA: >=1.03
Urobilinogen, UA: 0.2
pH, UA: 5.5

## 2014-12-02 MED ORDER — LEVOFLOXACIN 750 MG PO TABS: 750.0000 mg | ORAL_TABLET | Freq: Every day | ORAL | Status: DC

## 2014-12-02 MED ORDER — BENZONATATE 100 MG PO CAPS: 100.0000 mg | ORAL_CAPSULE | Freq: Three times a day (TID) | ORAL | Status: DC | PRN

## 2014-12-02 NOTE — Assessment & Plan Note (Addendum)
Likely viral in nature but was quite severe. Given UTI, I elected to use an antibiotic that would cover respiratory pathogens. Rx for levaquin sent.  Also treating with Tessalon.

## 2014-12-02 NOTE — Progress Notes (Signed)
Subjective:  Patient ID: Marissa Huff, female    DOB: 06/23/1931  Age: 81 y.o. MRN: [id number]  CC: UTI, Cough HPI:  81 year old female with a PMH of DM-2, HTN, HLD, and Frequent UTI presents with the above complaints.  1) UTI  Patient reports dysuria, urgency and frequency over the past few days.  No associated fever, chills.  No exacerbating or relieving factors.  2) Cough  Began Sunday.  Cough is productive of yellow sputum.  Cough is persistent and severe.  No exacerbating or relieving factors.   No associated fever, chills, SOB.  Social Hx   Social History   Social History  . Marital Status: Married    Spouse Name: N/A  . Number of Children: N/A  . Years of Education: N/A   Social History Main Topics  . Smoking status: Never Smoker   . Smokeless tobacco: Never Used  . Alcohol Use: No  . Drug Use: No  . Sexual Activity: Not Asked   Other Topics Concern  . None   Social History Narrative   Review of Systems  Constitutional: Negative for fever and chills.  Respiratory: Positive for cough. Negative for shortness of breath.   Genitourinary: Positive for dysuria, urgency and frequency.   Objective:  BP 122/60 mmHg  Pulse 99  Temp(Src) 98.1 F (36.7 C) (Oral)  Resp 18  Ht 5' 3.5" (1.613 m)  Wt 173 lb 4 oz (78.586 kg)  BMI 30.20 kg/m2  SpO2 97%  BP/Weight 12/02/2014 10/27/2014 [id number]  Systolic BP [id number] [id number]  Diastolic BP 60 60 92  Wt. (Lbs) 173.25 175.75 181  BMI 30.2 30.64 30.6    Physical Exam  Constitutional: She appears well-developed. No distress.  HENT:  Head: Normocephalic and atraumatic.  Mouth/Throat: No oropharyngeal exudate.  Cardiovascular: Normal rate and regular rhythm.   Pulmonary/Chest: Effort normal and breath sounds normal.  Abdominal: Soft. She exhibits no distension. There is no tenderness. There is no rebound and no guarding.  Neurological: She is alert.  Vitals reviewed.  Lab Results  Component Value  Date   WBC 4.8 03/03/2014   HGB 12.5 03/03/2014   HCT 36.4 03/03/2014   PLT 290.0 03/03/2014   GLUCOSE 226* 11/11/2014   CHOL 125 11/11/2014   TRIG 66.0 11/11/2014   HDL 39.00* 11/11/2014   LDLCALC 73 11/11/2014   ALT 14 11/11/2014   AST 14 11/11/2014   NA 137 11/11/2014   K 4.0 11/11/2014   CL 102 11/11/2014   CREATININE 0.74 11/11/2014   BUN 21 11/11/2014   CO2 26 11/11/2014   TSH 0.91 06/11/2014   HGBA1C 7.2* 06/11/2014   MICROALBUR 0.9 06/11/2014   Results for orders placed or performed in visit on 12/02/14 (from the past 24 hour(s))  POCT Urinalysis Dipstick     Status: Abnormal   Collection Time: 12/02/14  3:48 PM  Result Value Ref Range   Color, UA yellow    Clarity, UA light coudy    Glucose, UA 500    Bilirubin, UA neg    Ketones, UA neg    Spec Grav, UA >=1.030    Blood, UA moderate    pH, UA 5.5    Protein, UA 100    Urobilinogen, UA 0.2    Nitrite, UA pos    Leukocytes, UA Trace (A) Negative    Assessment & Plan:   Problem List Items Addressed This Visit    UTI (urinary tract infection) - Primary  Patient with frequent UTI's. Established problem with recurrence. Urinalysis consistent with UTI. Treating with Levaquin (as it covers respiratory pathogens as well).      Relevant Medications   levofloxacin (LEVAQUIN) 750 MG tablet   Other Relevant Orders   POCT Urinalysis Dipstick (Completed)   Urine Culture   Cough    Likely viral in nature but was quite severe. Given UTI, I elected to use an antibiotic that would cover respiratory pathogens. Rx for levaquin sent.  Also treating with Tessalon.          Meds ordered this encounter  Medications  . levofloxacin (LEVAQUIN) 750 MG tablet    Sig: Take 1 tablet (750 mg total) by mouth daily.    Dispense:  5 tablet    Refill:  0  . benzonatate (TESSALON) 100 MG capsule    Sig: Take 1 capsule (100 mg total) by mouth 3 (three) times daily as needed for cough.    Dispense:  30 capsule    Refill:   0    Follow-up: Return if symptoms worsen or fail to improve.  [place]

## 2014-12-02 NOTE — Patient Instructions (Signed)
Take the antibiotic as prescribed.  Follow up as needed.  Take care  Dr. Zelda Reames  

## 2014-12-02 NOTE — Assessment & Plan Note (Signed)
Patient with frequent UTI's. Established problem with recurrence. Urinalysis consistent with UTI. Treating with Levaquin (as it covers respiratory pathogens as well).

## 2014-12-04 LAB — URINE CULTURE: Colony Count: >=100000

## 2014-12-09 ENCOUNTER — Ambulatory Visit (INDEPENDENT_AMBULATORY_CARE_PROVIDER_SITE_OTHER): Payer: Medicare PPO

## 2014-12-09 VITALS — BP 118/64 | HR 83 | Temp 98.2°F | Resp 14 | Ht 64.0 in | Wt 172.4 lb

## 2014-12-09 DIAGNOSIS — Z0000 Encounter for general adult medical examination without abnormal findings: Secondary | ICD-10-CM

## 2014-12-09 DIAGNOSIS — Z13820 Encounter for screening for osteoporosis: Secondary | ICD-10-CM | POA: Diagnosis not present

## 2014-12-09 NOTE — Progress Notes (Signed)
Subjective:   Marissa Huff is a 81 y.o. female who presents for an Initial Medicare Annual Wellness Visit.  Review of Systems    No ROS.  Medicare Wellness Visit.  Cardiac Risk Factors include: advanced age (>7men, >7 women);diabetes mellitus;hypertension     Objective:    Today's Vitals   12/09/14 1314  BP: 118/64  Pulse: 83  Temp: 98.2 F (36.8 C)  TempSrc: Oral  Resp: 14  Height: 5\' 4"  (1.626 m)  Weight: 172 lb 6.4 oz (78.2 kg)  SpO2: 99%    Current Medications (verified) Outpatient Encounter Prescriptions as of 12/09/2014  Medication Sig  . acetaminophen (TYLENOL) 500 MG tablet Take 500 mg by mouth every 6 (six) hours as needed.  Marland Kitchen albuterol (PROVENTIL HFA;VENTOLIN HFA) 108 (90 BASE) MCG/ACT inhaler Inhale 2 puffs into the lungs every 6 (six) hours as needed for wheezing or shortness of breath.  Marland Kitchen amLODipine (NORVASC) 5 MG tablet Take 1 tablet (5 mg total) by mouth daily.  . benzonatate (TESSALON) 100 MG capsule Take 1 capsule (100 mg total) by mouth 3 (three) times daily as needed for cough.  . Blood Glucose Monitoring Suppl (ACCU-CHEK AVIVA) device Check sugar 4 times daily Dx E11.8  . Cholecalciferol (VITAMIN D-3 PO) Take 1 capsule by mouth daily.  Marland Kitchen DOXYLAMINE SUCCINATE, SLEEP, PO Take by mouth.  . fexofenadine (ALLEGRA) 180 MG tablet Take 180 mg by mouth daily.  . fluticasone (FLONASE) 50 MCG/ACT nasal spray Place 2 sprays into both nostrils daily.  Marland Kitchen glucagon (GLUCAGON EMERGENCY) 1 MG injection Inject 1 mg into the muscle once as needed.  Marland Kitchen glucose blood (ACCU-CHEK AVIVA) test strip Check sugar 4 times daily Dx E11.8  . imipramine (TOFRANIL) 25 MG tablet Take 25 mg by mouth at bedtime.  . insulin glargine (LANTUS) 100 UNIT/ML injection Inject 0.33 mLs (33 Units total) into the skin at bedtime. Pens please  - SOLOSTAR  . insulin lispro (HUMALOG KWIKPEN) 100 UNIT/ML KiwkPen Inject on a sliding scale between 14-28 units with each meal. Max daily dose 90  .  Lancets (ACCU-CHEK SOFT TOUCH) lancets Use as instructed. Dx E11.8  . LORazepam (ATIVAN) 0.5 MG tablet 1/2 tablet q day prn  . metFORMIN (GLUCOPHAGE-XR) 500 MG 24 hr tablet Take 500 mg by mouth 2 (two) times daily.  . Multiple Vitamins-Minerals (MULTIVITAMIN PO) Take by mouth daily.   . Multiple Vitamins-Minerals (PRESERVISION AREDS PO) Take by mouth 2 (two) times daily.  . pantoprazole (PROTONIX) 40 MG tablet TAKE 1 TABLET (40 MG TOTAL) BY MOUTH 2 (TWO) TIMES DAILY.  . SYNTHROID 175 MCG tablet TAKE 1 TABLET (175 MCG TOTAL) BY MOUTH DAILY BEFORE BREAKFAST.  Marland Kitchen traMADol (ULTRAM) 50 MG tablet 1 to 2 tabs every 4 to six hours  . valsartan-hydrochlorothiazide (DIOVAN-HCT) 160-12.5 MG per tablet Take 1 tablet by mouth daily.  . [DISCONTINUED] levofloxacin (LEVAQUIN) 750 MG tablet Take 1 tablet (750 mg total) by mouth daily.   No facility-administered encounter medications on file as of 12/09/2014.    Allergies (verified) Penicillins   History: Past Medical History  Diagnosis Date  . Diabetes mellitus without complication ([place])   . Hypothyroidism   . GERD (gastroesophageal reflux disease)   . Hypertension   . Allergy   . Spinal stenosis     lumbar laminectomy  . PUD (peptic ulcer disease)   . Gastroparesis   . Diverticulosis   . Carpal tunnel syndrome, bilateral   . 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 Skin cancer  Past Surgical History  Procedure Laterality Date  . Gastric lymphoma  1996    surgery  . Partial hysterectomy  1960    fibroid tumors, ovaries not removed  . Lumbar laminectomy  1999  . Laparoscopic cholecystectomy  6/01  . Carpal tunnel repai  10/10    left   . Arthroscopic left knee  12/05/08  . Antrectomy and vagotomy  1994  . Tonsillectomy    . Cataract extraction, bilateral    . Abdominal hysterectomy     Family History  Problem Relation Age of Onset  . Stroke Mother   . Stroke Father   . Hypertension Father   . Diabetes Brother   . Breast cancer Daughter 2  .  Colon cancer Neg Hx    Social History   Occupational History  . Not on file.   Social History Main Topics  . Smoking status: Never Smoker   . Smokeless tobacco: Never Used  . Alcohol Use: No  . Drug Use: No  . Sexual Activity: No    Tobacco Counseling Counseling given: Not Answered   Activities of Daily Living In your present state of health, do you have any difficulty performing the following activities: 12/09/2014  Hearing? Y  Vision? N  Difficulty concentrating or making decisions? N  Walking or climbing stairs? Y  Dressing or bathing? N  Doing errands, shopping? N  Preparing Food and eating ? N  Using the Toilet? N  In the past six months, have you accidently leaked urine? N  Do you have problems with loss of bowel control? N  Managing your Medications? N  Managing your Finances? N  Housekeeping or managing your Housekeeping? Y    Immunizations and Health Maintenance Immunization History  Administered Date(s) Administered  . Influenza Split 12/12/2011, 10/01/2012  . Influenza,inj,Quad PF,36+ Mos 09/03/2013, 10/27/2014  . Pneumococcal Conjugate-13 11/11/2014   Health Maintenance Due  Topic Date Due  . DEXA SCAN  03/10/1998    Patient Care Team: Einar Pheasant, MD as PCP - General (Internal Medicine)  Indicate any recent Medical Services you may have received from other than [facility] providers in the past year (date may be approximate).     Assessment:   This is a routine wellness examination for Marissa Huff.  The goal of the wellness visit is to assist the patient how to close the gaps in care and create a preventative care plan for the patient.   Calcium and Vit D as appropriate/ Osteoporosis risk reviewed.    Taking meds without issues; no barriers identified.  Safety issues reviewed; smoke detectors in the home. Firearms locked in a secure area. Wears seatbelts when driving or riding with others. No violence in the home.  No identified risk were noted;  The patient was oriented x 3; appropriate in dress and manner and no objective failures at ADL's or IADL's.   TDAP postponed, per patient request.  Referrals placed today: Bone Density.  Patient Concerns: None at this time.  Follow up with PCP as needed.   Hearing/Vision screen Hearing Screening Comments: Followed by [facility] Bilateral hearing aids Vision Screening Comments: Followed by [facility], Dr. Glennon Mac Annual visits Bilateral cataracts removed Wears glasses  Dietary issues and exercise activities discussed: Current Exercise Habits:: Home exercise routine (Walks the dog), Type of exercise: walking, Time (Minutes): 20, Frequency (Times/Week): 3, Weekly Exercise (Minutes/Week): 60, Intensity: Mild  Goals    . Increase physical activity     Currently walks the dog every other  day.  Incorporate chair exercises, as demonstrated, as tolerated      Depression Screen PHQ 2/9 Scores 12/09/2014 08/15/2014 05/08/2014 01/29/2013 01/27/2012 12/12/2011  PHQ - 2 Score 0 0 0 0 0 0    Fall Risk Fall Risk  12/09/2014 08/15/2014 05/08/2014 01/29/2013 11/22/2012  Falls in the past year? Yes No No Yes Yes  Number falls in past yr: 1 - - 1 1  Injury with Fall? No - - Yes Yes  Risk Factor Category  - - - - -  Follow up Education provided;Falls prevention discussed - - - -    Cognitive Function: MMSE - Mini Mental State Exam 12/09/2014  Orientation to time 5  Orientation to Place 5  Registration 3  Attention/ Calculation 5  Recall 3  Language- name 2 objects 2  Language- repeat 1  Language- follow 3 step command 3  Language- read & follow direction 1  Write a sentence 1  Copy design 1  Total score 30    Screening Tests Health Maintenance  Topic Date Due  . DEXA SCAN  03/10/1998  . TETANUS/TDAP  11/04/2015 (Originally 03/09/1950)  . HEMOGLOBIN A1C  12/11/2014  . OPHTHALMOLOGY EXAM  05/13/2015  . URINE MICROALBUMIN  06/11/2015  . INFLUENZA VACCINE  08/04/2015  . FOOT EXAM   08/15/2015  . MAMMOGRAM  08/22/2015  . PNA vac Low Risk Adult (2 of 2 - PPSV23) 11/11/2015  . ZOSTAVAX  Addressed      Plan:    End of life planning was discussed; aging in home or other; Advanced directives; additional material declined.    Return for follow up in February for scheduled follow up.  Dexa Scan as directed.  During the course of the visit, Marissa Huff was educated and counseled about the following appropriate screening and preventive services:   Vaccines to include Pneumoccal, Influenza, Hepatitis B, Td, Zostavax, HCV  Electrocardiogram  Cardiovascular disease screening  Colorectal cancer screening  Bone density screening  Diabetes screening  Glaucoma screening  Mammography/PAP  Nutrition counseling  Smoking cessation counseling  Patient Instructions (the written plan) were given to the patient.    Varney Biles, LPN   [id number]    Reviewed above information.  Agree with plan.   Dr Nicki Reaper

## 2014-12-09 NOTE — Patient Instructions (Addendum)
Marissa Huff,  Thank you for taking time to come for your Medicare Wellness Visit.  I appreciate your ongoing commitment to your health goals. Please review the following plan we discussed and let me know if I can assist you in the future.  DEXA Scan as directed  Happy Holidays!  Bone Densitometry Bone densitometry is an imaging test that uses a special X-ray to measure the amount of calcium and other minerals in your bones (bone density). This test is also known as a bone mineral density test or dual-energy X-ray absorptiometry (DXA). The test can measure bone density at your hip and your spine. It is similar to having a regular X-ray. You may have this test to:  Diagnose a condition that causes weak or thin bones (osteoporosis).  Predict your risk of a broken bone (fracture).  Determine how well osteoporosis treatment is working. LET [facility] CARE PROVIDER KNOW ABOUT:  Any allergies you have.  All medicines you are taking, including vitamins, herbs, eye drops, creams, and over-the-counter medicines.  Previous problems you or members of your family have had with the use of anesthetics.  Any blood disorders you have.  Previous surgeries you have had.  Medical conditions you have.  Possibility of pregnancy.  Any other medical test you had within the previous 14 days that used contrast material. RISKS AND COMPLICATIONS Generally, this is a safe procedure. However, problems can occur and may include the following:  This test exposes you to a very small amount of radiation.  The risks of radiation exposure may be greater to unborn children. BEFORE THE PROCEDURE  Do not take any calcium supplements for 24 hours before having the test. You can otherwise eat and drink what you usually do.  Take off all metal jewelry, eyeglasses, dental appliances, and any other metal objects. PROCEDURE  You may lie on an exam table. There will be an X-ray generator below you and an imaging  device above you.  Other devices, such as boxes or braces, may be used to position your body properly for the scan.  You will need to lie still while the machine slowly scans your body.  The images will show up on a computer monitor. AFTER THE PROCEDURE You may need more testing at a later time.   This information is not intended to replace advice given to you by your health care provider. Make sure you discuss any questions you have with your health care provider.   Document Released: 01/12/2004 Document Revised: 01/10/2014 Document Reviewed: 05/30/2013 Elsevier Interactive Patient Education [employer].

## 2014-12-16 ENCOUNTER — Other Ambulatory Visit: Payer: Self-pay | Admitting: Internal Medicine

## 2014-12-23 ENCOUNTER — Other Ambulatory Visit: Payer: Self-pay | Admitting: Internal Medicine

## 2015-01-08 DIAGNOSIS — J301 Allergic rhinitis due to pollen: Secondary | ICD-10-CM | POA: Diagnosis not present

## 2015-01-22 DIAGNOSIS — J301 Allergic rhinitis due to pollen: Secondary | ICD-10-CM | POA: Diagnosis not present

## 2015-01-23 DIAGNOSIS — M5416 Radiculopathy, lumbar region: Secondary | ICD-10-CM | POA: Diagnosis not present

## 2015-01-23 DIAGNOSIS — M5136 Other intervertebral disc degeneration, lumbar region: Secondary | ICD-10-CM | POA: Diagnosis not present

## 2015-01-26 ENCOUNTER — Ambulatory Visit
Admission: RE | Admit: 2015-01-26 | Discharge: 2015-01-26 | Disposition: A | Discharge status: Home / Self Care | Payer: PPO | Source: Ambulatory Visit | Attending: Internal Medicine | Admitting: Internal Medicine

## 2015-01-26 DIAGNOSIS — M8580 Other specified disorders of bone density and structure, unspecified site: Secondary | ICD-10-CM | POA: Diagnosis not present

## 2015-01-26 DIAGNOSIS — Z13820 Encounter for screening for osteoporosis: Secondary | ICD-10-CM | POA: Diagnosis not present

## 2015-01-26 DIAGNOSIS — Z780 Asymptomatic menopausal state: Secondary | ICD-10-CM | POA: Diagnosis not present

## 2015-01-26 DIAGNOSIS — M8589 Other specified disorders of bone density and structure, multiple sites: Secondary | ICD-10-CM | POA: Diagnosis not present

## 2015-01-29 DIAGNOSIS — J301 Allergic rhinitis due to pollen: Secondary | ICD-10-CM | POA: Diagnosis not present

## 2015-01-30 ENCOUNTER — Encounter: Payer: Self-pay | Admitting: Internal Medicine

## 2015-02-02 DIAGNOSIS — J301 Allergic rhinitis due to pollen: Secondary | ICD-10-CM | POA: Diagnosis not present

## 2015-02-05 ENCOUNTER — Other Ambulatory Visit: Payer: Self-pay | Admitting: Internal Medicine

## 2015-02-06 ENCOUNTER — Other Ambulatory Visit: Payer: Self-pay

## 2015-02-06 MED ORDER — BLOOD GLUCOSE MONITOR KIT: PACK | Status: DC

## 2015-02-06 MED ORDER — ACCU-CHEK SOFT TOUCH LANCETS MISC: Status: DC

## 2015-02-06 MED ORDER — GLUCOSE BLOOD VI STRP: ORAL_STRIP | Status: DC

## 2015-02-12 DIAGNOSIS — J301 Allergic rhinitis due to pollen: Secondary | ICD-10-CM | POA: Diagnosis not present

## 2015-02-16 DIAGNOSIS — J301 Allergic rhinitis due to pollen: Secondary | ICD-10-CM | POA: Diagnosis not present

## 2015-02-26 DIAGNOSIS — J301 Allergic rhinitis due to pollen: Secondary | ICD-10-CM | POA: Diagnosis not present

## 2015-02-27 ENCOUNTER — Ambulatory Visit (INDEPENDENT_AMBULATORY_CARE_PROVIDER_SITE_OTHER): Payer: PPO | Admitting: Family Medicine

## 2015-02-27 ENCOUNTER — Ambulatory Visit: Payer: Medicare PPO | Admitting: Internal Medicine

## 2015-02-27 ENCOUNTER — Encounter: Payer: Self-pay | Admitting: Family Medicine

## 2015-02-27 VITALS — BP 136/68 | HR 76 | Temp 98.3°F | Ht 63.5 in | Wt 176.4 lb

## 2015-02-27 DIAGNOSIS — R300 Dysuria: Secondary | ICD-10-CM | POA: Diagnosis not present

## 2015-02-27 DIAGNOSIS — N390 Urinary tract infection, site not specified: Secondary | ICD-10-CM | POA: Diagnosis not present

## 2015-02-27 LAB — POCT URINALYSIS DIPSTICK
Bilirubin, UA: NEGATIVE
Blood, UA: NEGATIVE
Glucose, UA: NEGATIVE
Ketones, UA: NEGATIVE
Nitrite, UA: POSITIVE
Spec Grav, UA: 1.02
Urobilinogen, UA: 0.2
pH, UA: 6.5

## 2015-02-27 LAB — URINALYSIS, MICROSCOPIC ONLY

## 2015-02-27 MED ORDER — CIPROFLOXACIN HCL 250 MG PO TABS: 250.0000 mg | ORAL_TABLET | Freq: Two times a day (BID) | ORAL | Status: DC

## 2015-02-27 NOTE — Progress Notes (Signed)
Patient ID: Marissa Huff, female   DOB: 1931/04/18, 82 y.o.   MRN: [id number]  Tommi Rumps, MD Phone: [phone]  Marissa Huff is a 82 y.o. female who presents today for same-day visit.  Patient notes several days of dysuria, urinary frequency, and urinary urgency. No hematuria or fevers. No abdominal pain. No vaginal discharge or irritation. She's been taking AZO tablets with some benefit. She has a history of UTI in the past. She feels well overall.  PMH: History of UTI.   ROS see history of present illness  Objective  Physical Exam Filed Vitals:   02/27/15 1310  BP: 136/68  Pulse: 76  Temp: 98.3 F (36.8 C)    BP Readings from Last 3 Encounters:  02/27/15 136/68  12/09/14 118/64  12/02/14 122/60   Wt Readings from Last 3 Encounters:  02/27/15 176 lb 6.4 oz (80.015 kg)  12/09/14 172 lb 6.4 oz (78.2 kg)  12/02/14 173 lb 4 oz (78.586 kg)    Physical Exam  Constitutional: She is well-developed, well-nourished, and in no distress.  HENT:  Head: Normocephalic and atraumatic.  Right Ear: External ear normal.  Left Ear: External ear normal.  Mouth/Throat: Oropharynx is clear and moist.  Cardiovascular: Normal rate, regular rhythm and normal heart sounds.  Exam reveals no gallop and no friction rub.   No murmur heard. Pulmonary/Chest: Effort normal and breath sounds normal. No respiratory distress. She has no wheezes. She has no rales.  Abdominal: Soft. Bowel sounds are normal. She exhibits no distension. There is no tenderness. There is no rebound and no guarding.  Neurological: She is alert. Gait normal.  Skin: Skin is warm and dry. She is not diaphoretic.     Assessment/Plan: Please see individual problem list.  Frequent UTI Patient with symptoms and UA consistent with UTI. Given patient's allergy to penicillin, age, and that she is on an ARB that increases risk for hyperkalemia we will treat with ciprofloxacin. I discussed the risks of this medicine  with her. We will send her urine for culture and microscopy. She's given return precautions.    Orders Placed This Encounter  Procedures  . Urine Culture  . Urine Microscopic Only  . POCT Urinalysis Dipstick    Meds ordered this encounter  Medications  . ciprofloxacin (CIPRO) 250 MG tablet    Sig: Take 1 tablet (250 mg total) by mouth 2 (two) times daily.    Dispense:  6 tablet    Refill:  0    Tommi Rumps

## 2015-02-27 NOTE — Addendum Note (Signed)
Addended by: Leeanne Rio on: 02/27/2015 02:37 PM   Modules accepted: Miquel Dunn

## 2015-02-27 NOTE — Assessment & Plan Note (Signed)
Patient with symptoms and UA consistent with UTI. Given patient's allergy to penicillin, age, and that she is on an ARB that increases risk for hyperkalemia we will treat with ciprofloxacin. I discussed the risks of this medicine with her. We will send her urine for culture and microscopy. She's given return precautions.

## 2015-02-27 NOTE — Patient Instructions (Signed)
Nice to meet you. You have UTI. We'll treat this with ciprofloxacin. We'll send for urine culture microscopy. If you develops abdominal pain, fever, or any new or change in symptoms please seek medical attention.

## 2015-02-27 NOTE — Progress Notes (Signed)
Pre visit review using our clinic review tool, if applicable. No additional management support is needed unless otherwise documented below in the visit note. 

## 2015-03-03 DIAGNOSIS — Z794 Long term (current) use of insulin: Secondary | ICD-10-CM | POA: Diagnosis not present

## 2015-03-03 DIAGNOSIS — E11649 Type 2 diabetes mellitus with hypoglycemia without coma: Secondary | ICD-10-CM | POA: Diagnosis not present

## 2015-03-03 LAB — URINE CULTURE: Colony Count: >=100000

## 2015-03-05 DIAGNOSIS — J301 Allergic rhinitis due to pollen: Secondary | ICD-10-CM | POA: Diagnosis not present

## 2015-03-09 DIAGNOSIS — J301 Allergic rhinitis due to pollen: Secondary | ICD-10-CM | POA: Diagnosis not present

## 2015-03-10 DIAGNOSIS — Z794 Long term (current) use of insulin: Secondary | ICD-10-CM | POA: Diagnosis not present

## 2015-03-10 DIAGNOSIS — E11649 Type 2 diabetes mellitus with hypoglycemia without coma: Secondary | ICD-10-CM | POA: Diagnosis not present

## 2015-03-11 ENCOUNTER — Ambulatory Visit (INDEPENDENT_AMBULATORY_CARE_PROVIDER_SITE_OTHER): Payer: PPO | Admitting: Internal Medicine

## 2015-03-11 ENCOUNTER — Encounter: Payer: Self-pay | Admitting: Internal Medicine

## 2015-03-11 VITALS — BP 120/60 | HR 80 | Temp 97.6°F | Resp 18 | Ht 63.5 in | Wt 174.2 lb

## 2015-03-11 DIAGNOSIS — Z0000 Encounter for general adult medical examination without abnormal findings: Secondary | ICD-10-CM

## 2015-03-11 DIAGNOSIS — E039 Hypothyroidism, unspecified: Secondary | ICD-10-CM

## 2015-03-11 DIAGNOSIS — R2681 Unsteadiness on feet: Secondary | ICD-10-CM

## 2015-03-11 DIAGNOSIS — I10 Essential (primary) hypertension: Secondary | ICD-10-CM

## 2015-03-11 DIAGNOSIS — Z794 Long term (current) use of insulin: Secondary | ICD-10-CM

## 2015-03-11 DIAGNOSIS — R42 Dizziness and giddiness: Secondary | ICD-10-CM

## 2015-03-11 DIAGNOSIS — N182 Chronic kidney disease, stage 2 (mild): Secondary | ICD-10-CM

## 2015-03-11 DIAGNOSIS — E7800 Pure hypercholesterolemia, unspecified: Secondary | ICD-10-CM

## 2015-03-11 DIAGNOSIS — E1122 Type 2 diabetes mellitus with diabetic chronic kidney disease: Secondary | ICD-10-CM

## 2015-03-11 DIAGNOSIS — IMO0001 Reserved for inherently not codable concepts without codable children: Secondary | ICD-10-CM

## 2015-03-11 DIAGNOSIS — E1165 Type 2 diabetes mellitus with hyperglycemia: Secondary | ICD-10-CM

## 2015-03-11 NOTE — Progress Notes (Signed)
Pre-visit discussion using our clinic review tool. No additional management support is needed unless otherwise documented below in the visit note.  

## 2015-03-11 NOTE — Progress Notes (Signed)
Patient ID: Marissa Huff, female   DOB: [birth year], 82 y.o.   MRN: [id number]   Subjective:    Patient ID: Marissa Huff, female    DOB: [birth year], 82 y.o.   MRN: [id number]  HPI  Patient with past history of diabetes, hypothyroidism, GERD and hypertension.  She comes in today for a scheduled follow up.  She reports that she has been having dizziness over the past several weeks.  Saw Dr Kathyrn Sheriff.  Was told not vertigo.  Has been taking otc meclizine.  Some days are better, but it never resolves.  Feels off balance.  States woke up this way when if first occurred.  Has been present since.  Has never resolved.  Feels light headed and off balance.  She is afraid she is going to fall.  Has not fallen.  Blood pressure has been doing well.  No low sugars.  Sugar overall has been doing better.  No chest pain or tightness.  No sob.  No nausea or vomiting.  Bowels stable.  Has been eating and drinking.     Past Medical History  Diagnosis Date  . Diabetes mellitus without complication ([place])   . Hypothyroidism   . GERD (gastroesophageal reflux disease)   . Hypertension   . Allergy   . Spinal stenosis     lumbar laminectomy  . PUD (peptic ulcer disease)   . Gastroparesis   . Diverticulosis   . Carpal tunnel syndrome, bilateral   . 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 Skin cancer    Past Surgical History  Procedure Laterality Date  . Gastric lymphoma  1996    surgery  . Partial hysterectomy  1960    fibroid tumors, ovaries not removed  . Lumbar laminectomy  1999  . Laparoscopic cholecystectomy  6/01  . Carpal tunnel repai  10/10    left   . Arthroscopic left knee  12/05/08  . Antrectomy and vagotomy  1994  . Tonsillectomy    . Cataract extraction, bilateral    . Abdominal hysterectomy     Family History  Problem Relation Age of Onset  . Stroke Mother   . Stroke Father   . Hypertension Father   . Diabetes Brother   . Breast cancer Daughter 19  . Colon cancer Neg Hx    Social History     Social History  . Marital Status: Married    Spouse Name: N/A  . Number of Children: N/A  . Years of Education: N/A   Social History Main Topics  . Smoking status: Never Smoker   . Smokeless tobacco: Never Used  . Alcohol Use: No  . Drug Use: No  . Sexual Activity: No   Other Topics Concern  . None   Social History Narrative    Outpatient Encounter Prescriptions as of 03/11/2015  Medication Sig  . ACCU-CHEK AVIVA PLUS test strip CHECK SUGAR 4 TIMES DAILY DX E11.8  . acetaminophen (TYLENOL) 500 MG tablet Take 500 mg by mouth every 6 (six) hours as needed.  Marland Kitchen albuterol (PROVENTIL HFA;VENTOLIN HFA) 108 (90 BASE) MCG/ACT inhaler Inhale 2 puffs into the lungs every 6 (six) hours as needed for wheezing or shortness of breath.  Marland Kitchen amLODipine (NORVASC) 5 MG tablet TAKE 1 TABLET (5 MG TOTAL) BY MOUTH DAILY.  . blood glucose meter kit and supplies KIT Dispense based on patient and insurance preference. Use up to four times daily as directed. (FOR ICD-10:E11.65)  . Blood Glucose Monitoring Suppl (ACCU-CHEK AVIVA) device  Check sugar 4 times daily Dx E11.8  . Cholecalciferol (VITAMIN D-3 PO) Take 1 capsule by mouth daily.  Marland Kitchen DOXYLAMINE SUCCINATE, SLEEP, PO Take by mouth.  . fexofenadine (ALLEGRA) 180 MG tablet Take 180 mg by mouth daily.  . fluticasone (FLONASE) 50 MCG/ACT nasal spray Place 2 sprays into both nostrils daily.  Marland Kitchen glucagon (GLUCAGON EMERGENCY) 1 MG injection Inject 1 mg into the muscle once as needed.  Marland Kitchen glucose blood test strip Use as instructed  . imipramine (TOFRANIL) 25 MG tablet Take 25 mg by mouth at bedtime.  . insulin glargine (LANTUS) 100 UNIT/ML injection Inject 0.33 mLs (33 Units total) into the skin at bedtime. Pens please  - SOLOSTAR  . insulin lispro (HUMALOG KWIKPEN) 100 UNIT/ML KiwkPen Inject on a sliding scale between 14-28 units with each meal. Max daily dose 90  . Lancets (ACCU-CHEK SOFT TOUCH) lancets Use as instructed. Dx E11.8  . LORazepam (ATIVAN) 0.5 MG  tablet 1/2 tablet q day prn  . metFORMIN (GLUCOPHAGE-XR) 500 MG 24 hr tablet TAKE 1 TABLET (500 MG TOTAL) BY MOUTH 2 (TWO) TIMES DAILY.  . Multiple Vitamins-Minerals (MULTIVITAMIN PO) Take by mouth daily.   . Multiple Vitamins-Minerals (PRESERVISION AREDS PO) Take by mouth 2 (two) times daily.  . pantoprazole (PROTONIX) 40 MG tablet TAKE 1 TABLET (40 MG TOTAL) BY MOUTH 2 (TWO) TIMES DAILY.  . SYNTHROID 175 MCG tablet TAKE 1 TABLET (175 MCG TOTAL) BY MOUTH DAILY BEFORE BREAKFAST.  Marland Kitchen traMADol (ULTRAM) 50 MG tablet 1 to 2 tabs every 4 to six hours  . valsartan-hydrochlorothiazide (DIOVAN-HCT) 160-12.5 MG per tablet Take 1 tablet by mouth daily.  . [DISCONTINUED] ciprofloxacin (CIPRO) 250 MG tablet Take 1 tablet (250 mg total) by mouth 2 (two) times daily.  . [DISCONTINUED] benzonatate (TESSALON) 100 MG capsule Take 1 capsule (100 mg total) by mouth 3 (three) times daily as needed for cough. (Patient not taking: Reported on 03/11/2015)   No facility-administered encounter medications on file as of 03/11/2015.    Review of Systems  Constitutional: Negative for appetite change and unexpected weight change.  HENT: Negative for congestion and sinus pressure.   Respiratory: Negative for cough, chest tightness and shortness of breath.   Cardiovascular: Negative for chest pain, palpitations and leg swelling.  Gastrointestinal: Negative for nausea, vomiting, abdominal pain and diarrhea.  Genitourinary: Negative for dysuria and difficulty urinating.  Musculoskeletal: Negative for myalgias and joint swelling.  Skin: Negative for color change and rash.  Neurological: Positive for dizziness and light-headedness. Negative for headaches.       Off balance.  Persistent.    Psychiatric/Behavioral: Negative for dysphoric mood and agitation.       Objective:     Blood pressure rechecked by me:  Standing 130/62 - not orthostatic on exam.    Physical Exam  Constitutional: She appears well-developed and  well-nourished. No distress.  HENT:  Nose: Nose normal.  Mouth/Throat: Oropharynx is clear and moist.  Eyes: Conjunctivae are normal. Right eye exhibits no discharge. Left eye exhibits no discharge.  Neck: Neck supple. No thyromegaly present.  Cardiovascular: Normal rate and regular rhythm.   Pulmonary/Chest: Breath sounds normal. No respiratory distress. She has no wheezes.  Abdominal: Soft. Bowel sounds are normal. There is no tenderness.  Musculoskeletal: She exhibits no edema or tenderness.  Lymphadenopathy:    She has no cervical adenopathy.  Neurological:  Some light headedness with changing positions.   Skin: No rash noted. No erythema.  Psychiatric: She has a normal mood  and affect. Her behavior is normal.    BP 120/60 mmHg  Pulse 80  Temp(Src) 97.6 F (36.4 C) (Oral)  Resp 18  Ht 5' 3.5" (1.613 m)  Wt 174 lb 4 oz (79.039 kg)  BMI 30.38 kg/m2  SpO2 97% Wt Readings from Last 3 Encounters:  03/11/15 174 lb 4 oz (79.039 kg)  02/27/15 176 lb 6.4 oz (80.015 kg)  12/09/14 172 lb 6.4 oz (78.2 kg)     Lab Results  Component Value Date   WBC 4.8 03/03/2014   HGB 12.5 03/03/2014   HCT 36.4 03/03/2014   PLT 290.0 03/03/2014   GLUCOSE 226* 11/11/2014   CHOL 125 11/11/2014   TRIG 66.0 11/11/2014   HDL 39.00* 11/11/2014   LDLCALC 73 11/11/2014   ALT 14 11/11/2014   AST 14 11/11/2014   NA 137 11/11/2014   K 4.0 11/11/2014   CL 102 11/11/2014   CREATININE 0.74 11/11/2014   BUN 21 11/11/2014   CO2 26 11/11/2014   TSH 0.91 06/11/2014   HGBA1C 7.2* 06/11/2014   MICROALBUR 0.9 06/11/2014       Assessment & Plan:   Problem List Items Addressed This Visit    CKD stage 2 due to type 2 diabetes mellitus ([place])    Stay hydrated.  Follow met b.        Dizziness    Acute onset and persistent.  Not resolving.  Has seen ENT.  Not vertigo.  Feels off balance.  Not orthostatic on exam.  No cardiac symptoms.  Concern over CVA.  Will obtain MRI brain and carotid ultrasound.   Start ECASA 78m q day.  Discussed the possibility of GI side effects.  She is on protonix.  Further w/up pending results.        Relevant Orders   MR Brain W Wo Contrast   [place]id Duplex Bilateral   Vitamin B12   Health care maintenance - Primary   Hypercholesterolemia    Low cholesterol diet and exercise.  Follow lipid panel.   Lab Results  Component Value Date   CHOL 125 11/11/2014   HDL 39.00* 11/11/2014   LDLCALC 73 11/11/2014   TRIG 66.0 11/11/2014   CHOLHDL 3 11/11/2014        Relevant Orders   Lipid panel   Hepatic function panel   Hypertension    Blood pressure doing well.  Not orthostatic on exam.  Follow.        Relevant Orders   CBC with Differential/Platelet   Hypothyroidism    On thyroid replacement.  Follow tsh.        Type 2 diabetes mellitus, uncontrolled ([place]    Just saw Dr PEddie Dibblesyesterday.  Sugars have been doing better.  No low sugars.  Follow.        Relevant Orders   Hemoglobin AJ3H  Basic metabolic panel    Other Visit Diagnoses    Unsteady gait        Relevant Orders    MR Brain W Wo Contrast    [place]orea[place]otid Duplex Bilateral        SEinar Pheasant MD

## 2015-03-13 ENCOUNTER — Encounter: Payer: Self-pay | Admitting: Internal Medicine

## 2015-03-13 ENCOUNTER — Telehealth: Payer: Self-pay | Admitting: *Deleted

## 2015-03-13 DIAGNOSIS — R42 Dizziness and giddiness: Secondary | ICD-10-CM | POA: Insufficient documentation

## 2015-03-13 NOTE — Telephone Encounter (Signed)
Pt is requesting an order for an MRI and a referral for a Neurologist. Please advise, thanks

## 2015-03-13 NOTE — Telephone Encounter (Signed)
Patient stated that she was to have a order for a MRI , however never received a call. Please advise Pt Contact [phone]

## 2015-03-13 NOTE — Telephone Encounter (Signed)
Orders placed.  Please schedule and notify pt.  Thanks

## 2015-03-15 ENCOUNTER — Encounter: Payer: Self-pay | Admitting: Internal Medicine

## 2015-03-15 NOTE — Assessment & Plan Note (Signed)
Just saw Dr Eddie Dibbles yesterday.  Sugars have been doing better.  No low sugars.  Follow.

## 2015-03-15 NOTE — Assessment & Plan Note (Signed)
On thyroid replacement.  Follow tsh.  

## 2015-03-15 NOTE — Assessment & Plan Note (Signed)
Blood pressure doing well.  Not orthostatic on exam.  Follow.   

## 2015-03-15 NOTE — Assessment & Plan Note (Signed)
Stay hydrated.  Follow met b.  

## 2015-03-15 NOTE — Assessment & Plan Note (Signed)
Acute onset and persistent.  Not resolving.  Has seen ENT.  Not vertigo.  Feels off balance.  Not orthostatic on exam.  No cardiac symptoms.  Concern over CVA.  Will obtain MRI brain and carotid ultrasound.  Start ECASA 81mg  q day.  Discussed the possibility of GI side effects.  She is on protonix.  Further w/up pending results.

## 2015-03-15 NOTE — Assessment & Plan Note (Signed)
Low cholesterol diet and exercise.  Follow lipid panel.   Lab Results  Component Value Date   CHOL 125 11/11/2014   HDL 39.00* 11/11/2014   LDLCALC 73 11/11/2014   TRIG 66.0 11/11/2014   CHOLHDL 3 11/11/2014

## 2015-03-16 ENCOUNTER — Other Ambulatory Visit: Payer: Self-pay | Admitting: Internal Medicine

## 2015-03-16 DIAGNOSIS — J301 Allergic rhinitis due to pollen: Secondary | ICD-10-CM | POA: Diagnosis not present

## 2015-03-23 DIAGNOSIS — E119 Type 2 diabetes mellitus without complications: Secondary | ICD-10-CM | POA: Diagnosis not present

## 2015-03-24 ENCOUNTER — Other Ambulatory Visit (INDEPENDENT_AMBULATORY_CARE_PROVIDER_SITE_OTHER): Payer: PPO

## 2015-03-24 DIAGNOSIS — R42 Dizziness and giddiness: Secondary | ICD-10-CM | POA: Diagnosis not present

## 2015-03-24 DIAGNOSIS — Z794 Long term (current) use of insulin: Secondary | ICD-10-CM

## 2015-03-24 DIAGNOSIS — I10 Essential (primary) hypertension: Secondary | ICD-10-CM

## 2015-03-24 DIAGNOSIS — E7800 Pure hypercholesterolemia, unspecified: Secondary | ICD-10-CM | POA: Diagnosis not present

## 2015-03-24 DIAGNOSIS — E1165 Type 2 diabetes mellitus with hyperglycemia: Secondary | ICD-10-CM

## 2015-03-24 DIAGNOSIS — IMO0001 Reserved for inherently not codable concepts without codable children: Secondary | ICD-10-CM

## 2015-03-24 LAB — BASIC METABOLIC PANEL
BUN: 19 mg/dL (ref 6–23)
CO2: 26 mEq/L (ref 19–32)
Calcium: 9.7 mg/dL (ref 8.4–10.5)
Chloride: 104 mEq/L (ref 96–112)
Creatinine, Ser: 0.77 mg/dL (ref 0.40–1.20)
GFR: 76.27 mL/min (ref >60.00)
Glucose, Bld: 202 mg/dL — ABNORMAL HIGH (ref 70–99)
Potassium: 4.5 mEq/L (ref 3.5–5.1)
Sodium: 139 mEq/L (ref 135–145)

## 2015-03-24 LAB — CBC WITH DIFFERENTIAL/PLATELET
Basophils Absolute: 0 10*3/uL (ref 0.0–0.1)
Basophils Relative: 0.6 % (ref 0.0–3.0)
Eosinophils Absolute: 0.2 10*3/uL (ref 0.0–0.7)
Eosinophils Relative: 3.3 % (ref 0.0–5.0)
HCT: 34 % — ABNORMAL LOW (ref 36.0–46.0)
Hemoglobin: 11.7 g/dL — ABNORMAL LOW (ref 12.0–15.0)
Lymphocytes Relative: 22.1 % (ref 12.0–46.0)
Lymphs Abs: 1.1 10*3/uL (ref 0.7–4.0)
MCHC: 34.3 g/dL (ref 30.0–36.0)
MCV: 87.9 fl (ref 78.0–100.0)
Monocytes Absolute: 0.5 10*3/uL (ref 0.1–1.0)
Monocytes Relative: 11 % (ref 3.0–12.0)
Neutro Abs: 3.1 10*3/uL (ref 1.4–7.7)
Neutrophils Relative %: 63 % (ref 43.0–77.0)
Platelets: 208 10*3/uL (ref 150.0–400.0)
RBC: 3.87 Mil/uL (ref 3.87–5.11)
RDW: 14.6 % (ref 11.5–15.5)
WBC: 4.9 10*3/uL (ref 4.0–10.5)

## 2015-03-24 LAB — LIPID PANEL
Cholesterol: 141 mg/dL (ref 0–200)
HDL: 50.4 mg/dL (ref >39.00)
LDL Cholesterol: 77 mg/dL (ref 0–99)
NonHDL: 90.57
Total CHOL/HDL Ratio: 3
Triglycerides: 68 mg/dL (ref 0.0–149.0)
VLDL: 13.6 mg/dL (ref 0.0–40.0)

## 2015-03-24 LAB — HEPATIC FUNCTION PANEL
ALT: 14 U/L (ref 0–35)
AST: 15 U/L (ref 0–37)
Albumin: 4 g/dL (ref 3.5–5.2)
Alkaline Phosphatase: 84 U/L (ref 39–117)
Bilirubin, Direct: 0.1 mg/dL (ref 0.0–0.3)
Total Bilirubin: 0.4 mg/dL (ref 0.2–1.2)
Total Protein: 6.9 g/dL (ref 6.0–8.3)

## 2015-03-24 LAB — VITAMIN B12: Vitamin B-12: 456 pg/mL (ref 211–911)

## 2015-03-24 LAB — HEMOGLOBIN A1C: Hgb A1c MFr Bld: 6.9 % — ABNORMAL HIGH (ref 4.6–6.5)

## 2015-03-25 ENCOUNTER — Other Ambulatory Visit: Payer: Self-pay | Admitting: Internal Medicine

## 2015-03-25 ENCOUNTER — Encounter: Payer: Self-pay | Admitting: Internal Medicine

## 2015-03-25 ENCOUNTER — Other Ambulatory Visit (INDEPENDENT_AMBULATORY_CARE_PROVIDER_SITE_OTHER): Payer: PPO

## 2015-03-25 DIAGNOSIS — D649 Anemia, unspecified: Secondary | ICD-10-CM

## 2015-03-25 LAB — IBC PANEL
Iron: 60 ug/dL (ref 42–145)
Saturation Ratios: 18 % — ABNORMAL LOW (ref 20.0–50.0)
Transferrin: 238 mg/dL (ref 212.0–360.0)

## 2015-03-25 LAB — FERRITIN: Ferritin: 42.9 ng/mL (ref 10.0–291.0)

## 2015-03-25 NOTE — Progress Notes (Signed)
Order placed for add on iron studies.  

## 2015-03-27 ENCOUNTER — Ambulatory Visit
Admission: RE | Admit: 2015-03-27 | Discharge: 2015-03-27 | Disposition: A | Discharge status: Home / Self Care | Payer: PPO | Source: Ambulatory Visit | Attending: Internal Medicine | Admitting: Internal Medicine

## 2015-03-27 DIAGNOSIS — R2681 Unsteadiness on feet: Secondary | ICD-10-CM | POA: Diagnosis not present

## 2015-03-27 DIAGNOSIS — I6782 Cerebral ischemia: Secondary | ICD-10-CM | POA: Diagnosis not present

## 2015-03-27 DIAGNOSIS — R2689 Other abnormalities of gait and mobility: Secondary | ICD-10-CM | POA: Diagnosis not present

## 2015-03-27 DIAGNOSIS — G319 Degenerative disease of nervous system, unspecified: Secondary | ICD-10-CM | POA: Insufficient documentation

## 2015-03-27 DIAGNOSIS — R42 Dizziness and giddiness: Secondary | ICD-10-CM | POA: Diagnosis not present

## 2015-03-27 MED ORDER — GADOBENATE DIMEGLUMINE 529 MG/ML IV SOLN
20.0000 mL | Freq: Once | INTRAVENOUS | Status: AC | PRN
  Administered 2015-03-27: 16 mL via INTRAVENOUS

## 2015-03-29 ENCOUNTER — Encounter: Payer: Self-pay | Admitting: *Deleted

## 2015-03-30 DIAGNOSIS — J301 Allergic rhinitis due to pollen: Secondary | ICD-10-CM | POA: Diagnosis not present

## 2015-04-01 ENCOUNTER — Ambulatory Visit (INDEPENDENT_AMBULATORY_CARE_PROVIDER_SITE_OTHER): Payer: PPO | Admitting: Cardiology

## 2015-04-01 ENCOUNTER — Encounter: Payer: Self-pay | Admitting: Cardiology

## 2015-04-01 VITALS — BP 119/73 | HR 70 | Ht 63.0 in | Wt 175.2 lb

## 2015-04-01 DIAGNOSIS — R0989 Other specified symptoms and signs involving the circulatory and respiratory systems: Secondary | ICD-10-CM | POA: Diagnosis not present

## 2015-04-01 DIAGNOSIS — R2681 Unsteadiness on feet: Secondary | ICD-10-CM

## 2015-04-01 DIAGNOSIS — I10 Essential (primary) hypertension: Secondary | ICD-10-CM

## 2015-04-01 DIAGNOSIS — R42 Dizziness and giddiness: Secondary | ICD-10-CM

## 2015-04-01 NOTE — Progress Notes (Signed)
Cardiology Office Note   Date:  04/01/2015   ID:  Marissa Huff, DOB [birth year], MRN [id number]  Referring Doctor:  Einar Pheasant, MD   Cardiologist:   Wende Bushy, MD   Reason for consultation:  Chief Complaint  Patient presents with  . other    Ref by Dr. Nicki Reaper for unsteady gait and vertigo. Meds reviewed by the patient verbally.      History of Present Illness: Marissa Huff is a 82 y.o. female who presents for Evaluation of dizziness. Patient has already been referred to neurology for dizziness, unsteady gait.  Symptoms of dizziness, room spinning, unsteady gaits have been going on for about a month. Patient recalls a history of vertigo a while back. She feels that her current symptoms are similar to the vertigo symptoms but less intense. Dizziness associated with unsteady gait occurs randomly, moderate to severe intensity, improved with meclizine. Dizziness is described as feeling that the room is spinning and that she needs to hold onto something to prevent her from falling. Not associated with chest pain, shortness of breath, palpitations. Not associated with loss of consciousness. She has used up about 5 bottles of over-the-counter meclizine. She is off of this for now on the advice of her PCP while she is undergoing workup.  In terms of functional capacity, she walks the dog in the yard, with areas that are elevated and hilly, for 15-20 minutes at a time. No symptoms of chest pain or shortness of breath. She is able to take a flight of stairs which is about 15 steps without any trouble.   Patient denies headache, fever, cough, colds, abdominal pain, orthopnea, edema, PND.   ROS:  Please see the history of present illness. Aside from mentioned under HPI, all other systems are reviewed and negative.     Past Medical History  Diagnosis Date  . Diabetes mellitus without complication ([place])   . Hypothyroidism   . GERD (gastroesophageal reflux disease)   .  Hypertension   . Allergy   . Spinal stenosis     lumbar laminectomy  . PUD (peptic ulcer disease)   . Gastroparesis   . Diverticulosis   . Carpal tunnel syndrome, bilateral   . 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 Skin cancer     Past Surgical History  Procedure Laterality Date  . Gastric lymphoma  1996    surgery  . Partial hysterectomy  1960    fibroid tumors, ovaries not removed  . Lumbar laminectomy  1999  . Laparoscopic cholecystectomy  6/01  . Carpal tunnel repai  10/10    left   . Arthroscopic left knee  12/05/08  . Antrectomy and vagotomy  1994  . Tonsillectomy    . Cataract extraction, bilateral    . Abdominal hysterectomy       reports that she has never smoked. She has never used smokeless tobacco. She reports that she does not drink alcohol or use illicit drugs.   family history includes Breast cancer (age of onset: 62) in her daughter; Diabetes in her brother; Hypertension in her father; Stroke in her father and mother. There is no history of Colon cancer. Mother underwent open heart surgery for hole in the heart likely congenital, at age 61   Current Outpatient Prescriptions  Medication Sig Dispense Refill  . ACCU-CHEK AVIVA PLUS test strip CHECK SUGAR 4 TIMES DAILY DX E11.8 200 each 5  . acetaminophen (TYLENOL) 500 MG tablet Take 500 mg by mouth every  6 (six) hours as needed.    Marland Kitchen albuterol (PROVENTIL HFA;VENTOLIN HFA) 108 (90 BASE) MCG/ACT inhaler Inhale 2 puffs into the lungs every 6 (six) hours as needed for wheezing or shortness of breath. 1 Inhaler 1  . amLODipine (NORVASC) 5 MG tablet TAKE 1 TABLET (5 MG TOTAL) BY MOUTH DAILY. 90 tablet 2  . blood glucose meter kit and supplies KIT Dispense based on patient and insurance preference. Use up to four times daily as directed. (FOR ICD-10:E11.65) 1 each 0  . Blood Glucose Monitoring Suppl (ACCU-CHEK AVIVA) device Check sugar 4 times daily Dx E11.8 1 each 0  . Cholecalciferol (VITAMIN D-3 PO) Take 1 capsule by mouth  daily.    . fexofenadine (ALLEGRA) 180 MG tablet Take 180 mg by mouth daily.    . fluticasone (FLONASE) 50 MCG/ACT nasal spray Place 2 sprays into both nostrils daily. 48 g 3  . glucagon (GLUCAGON EMERGENCY) 1 MG injection Inject 1 mg into the muscle once as needed. 1 each 12  . glucose blood test strip Use as instructed 100 each 12  . imipramine (TOFRANIL) 25 MG tablet Take 25 mg by mouth at bedtime.    . insulin glargine (LANTUS) 100 UNIT/ML injection Inject 0.33 mLs (33 Units total) into the skin at bedtime. Pens please  - SOLOSTAR 10 mL 5  . insulin lispro (HUMALOG KWIKPEN) 100 UNIT/ML KiwkPen Inject on a sliding scale between 14-28 units with each meal. Max daily dose 90 90 mL 3  . Lancets (ACCU-CHEK SOFT TOUCH) lancets Use as instructed. Dx E11.8 200 each 5  . LORazepam (ATIVAN) 0.5 MG tablet 1/2 tablet q day prn 30 tablet 0  . metFORMIN (GLUCOPHAGE-XR) 500 MG 24 hr tablet TAKE 1 TABLET (500 MG TOTAL) BY MOUTH 2 (TWO) TIMES DAILY. 60 tablet 5  . Multiple Vitamins-Minerals (MULTIVITAMIN PO) Take by mouth daily.     . Multiple Vitamins-Minerals (PRESERVISION AREDS PO) Take by mouth 2 (two) times daily.    . pantoprazole (PROTONIX) 40 MG tablet TAKE 1 TABLET (40 MG TOTAL) BY MOUTH 2 (TWO) TIMES DAILY. 180 tablet 1  . SYNTHROID 175 MCG tablet TAKE 1 TABLET (175 MCG TOTAL) BY MOUTH DAILY BEFORE BREAKFAST. 90 tablet 3  . traMADol (ULTRAM) 50 MG tablet 1 to 2 tabs every 4 to six hours    . valsartan-hydrochlorothiazide (DIOVAN-HCT) 160-12.5 MG per tablet Take 1 tablet by mouth daily. 90 tablet 3   No current facility-administered medications for this visit.    Allergies: Penicillins    PHYSICAL EXAM: VS:  BP 119/73 mmHg  Pulse 70  Ht _0  (1.6 m)  Wt 175 lb 4 oz (79.493 kg)  BMI 31.05 kg/m2  SpO2 98% , Body mass index is 31.05 kg/(m^2). Wt Readings from Last 3 Encounters:  04/01/15 175 lb 4 oz (79.493 kg)  03/11/15 174 lb 4 oz (79.039 kg)  02/27/15 176 lb 6.4 oz (80.015 kg)      Orthostatic VS for the past 24 hrs:  BP- Lying Pulse- Lying BP- Sitting Pulse- Sitting BP- Standing at 0 minutes Pulse- Standing at 0 minutes  04/01/15 1034 126/70 mmHg 69 120/71 mmHg 73 122/67 mmHg 76     No orthostatic changes in her vital signs  GENERAL:  well developed, well nourished,  obese, not in acute distress HEENT: normocephalic, pink conjunctivae, anicteric sclerae, no xanthelasma, normal dentition, oropharynx clear NECK:  no neck vein engorgement, JVP normal, no hepatojugular reflux, carotid upstroke brisk and symmetric,  soft bruit noted  on the right, no thyromegaly, no lymphadenopathy LUNGS:  good respiratory effort, clear to auscultation bilaterally CV:  PMI not displaced, no thrills, no lifts, S1 and S2 within normal limits, no palpable S3 or S4, no murmurs, no rubs, no gallops ABD:  Soft, nontender, nondistended, normoactive bowel sounds, no abdominal aortic bruit, no hepatomegaly, no splenomegaly MS: nontender back, no kyphosis, no scoliosis, no joint deformities EXT:  2+ DP/PT pulses, no edema, no varicosities, no cyanosis, no clubbing SKIN: warm, nondiaphoretic, normal turgor, no ulcers NEUROPSYCH: alert, oriented to person, place, and time, normal mood, appropriate affect, no pronator drift, unable to do tandem walk   Recent Labs: 06/11/2014: TSH 0.91 03/24/2015: ALT 14; BUN 19; Creatinine, Ser 0.77; Hemoglobin 11.7*; Platelets 208.0; Potassium 4.5; Sodium 139   Lipid Panel    Component Value Date/Time   CHOL 141 03/24/2015 0919   TRIG 68.0 03/24/2015 0919   HDL 50.40 03/24/2015 0919   CHOLHDL 3 03/24/2015 0919   VLDL 13.6 03/24/2015 0919   LDLCALC 77 03/24/2015 0919     Other studies Reviewed:  EKG:  EKG Is ordered today. The ekg from 03/29/2017as personally reviewed by me and it revealed sinus rhythm 70 BPM.   Additional studies/ records that were reviewed personally reviewed by me today include: None available   ASSESSMENT AND PLAN:  Dizziness which  is vertigo-like with patient's description, associated with unsteady gait   not orthostatic on vitals today  agree with neurology evaluation Cardiac evaluation requested by PCP. Will proceed with echocardiogram and pharmacologic stress test.  She has some risk factors for coronary artery disease including age, hypertension, diabetes. No indication for Holter monitor at this time as patient denies any palpitations. EKG overall unremarkable.  Right carotid bruit noted Recommend carotid duplex ultrasound to evaluate for possible carotid artery disease  Hypertension BP is well controlled. Continue monitoring BP. Continue current medical therapy and lifestyle changes.   Current medicines are reviewed at length with the patient today.  The patient does not have concerns regarding medicines.  Labs/ tests ordered today include:  Orders Placed This Encounter  Procedures  . NM Myocar Multi W/Spect W/Wall Motion / EF  . EKG 12-Lead  . ECHOCARDIOGRAM COMPLETE    I had a lengthy and detailed discussion with the patient regarding diagnoses, prognosis, diagnostic options, treatment options..   I counseled the patient on importance of lifestyle modification including heart healthy diet, regular physical activity..   Disposition:   FU with undersigned after tests   Signed, Wende Bushy, MD  04/01/2015 12:34 PM    [place]

## 2015-04-01 NOTE — Patient Instructions (Addendum)
Medication Instructions:  Your physician recommends that you continue on your current medications as directed. Please refer to the Current Medication list given to you today.   Labwork: None ordered  Testing/Procedures: Your physician has requested that you have an echocardiogram. Echocardiography is a painless test that uses sound waves to create images of your heart. It provides your doctor with information about the size and shape of your heart and how well your heart's chambers and valves are working. This procedure takes approximately one hour. There are no restrictions for this procedure.  Date & Time: _____________________________________________________________________  Your physician has requested that you have a carotid duplex. This test is an ultrasound of the carotid arteries in your neck. It looks at blood flow through these arteries that supply the brain with blood. Allow one hour for this exam. There are no restrictions or special instructions.  Date & Time: ____________________________________________________________________  Your physician has requested that you have a lexiscan myoview. For further information please visit [email or web address]. Please follow instruction sheet, as given.  Date & Time:______Thursday April 09, 2015 at 07:30 AM______________________________________  Follow-Up: Your physician recommends that you schedule a follow-up appointment after testing to review results  Date & Time: ________________________________________________________________________   Any Other Special Instructions Will Be Listed Below (If Applicable).  Stem  Your caregiver has ordered a Stress Test with nuclear imaging. The purpose of this test is to evaluate the blood supply to your heart muscle. This procedure is referred to as a "Non-Invasive Stress Test." This is because other than having an IV started in your vein, nothing is inserted or "invades" your body. Cardiac  stress tests are done to find areas of poor blood flow to the heart by determining the extent of coronary artery disease (CAD). Some patients exercise on a treadmill, which naturally increases the blood flow to your heart, while others who are  unable to walk on a treadmill due to physical limitations have a pharmacologic/chemical stress agent called Lexiscan . This medicine will mimic walking on a treadmill by temporarily increasing your coronary blood flow.   Please note: these test may take anywhere between 2-4 hours to complete  PLEASE REPORT TO [facility] AT THE FIRST DESK WILL DIRECT YOU WHERE TO GO  Date of Procedure:_Thursday April 09, 2015 at 07:30 AM_________________________  Arrival Time for Procedure:___Arrive at 07:15 AM______________  Instructions regarding medication:   _X___ : Hold diabetes medication morning of procedure. HOLD metformin for 24 hours before and 48 hours after testing.   __X__:  Take 1/2 dose of lantus the night before   PLEASE NOTIFY THE OFFICE AT LEAST 24 HOURS IN ADVANCE IF YOU ARE UNABLE TO KEEP YOUR APPOINTMENT.  [phone] AND  PLEASE NOTIFY NUCLEAR MEDICINE AT [facility] AT LEAST 24 HOURS IN ADVANCE IF YOU ARE UNABLE TO KEEP YOUR APPOINTMENT. [phone]  How to prepare for your Myoview test:   Do not eat or drink after midnight  No caffeine for 24 hours prior to test  No smoking 24 hours prior to test.  Your medication may be taken with water.  If your doctor stopped a medication because of this test, do not take that medication.  Ladies, please do not wear dresses.  Skirts or pants are appropriate. Please wear a short sleeve shirt.  No perfume, cologne or lotion.  Wear comfortable walking shoes. No heels!             If you need a refill on your  cardiac medications before your next appointment, please call your pharmacy.  Echocardiogram An echocardiogram, or echocardiography, uses sound waves  (ultrasound) to produce an image of your heart. The echocardiogram is simple, painless, obtained within a short period of time, and offers valuable information to your health care provider. The images from an echocardiogram can provide information such as:  Evidence of coronary artery disease (CAD).  Heart size.  Heart muscle function.  Heart valve function.  Aneurysm detection.  Evidence of a past heart attack.  Fluid buildup around the heart.  Heart muscle thickening.  Assess heart valve function. LET [facility] CARE PROVIDER KNOW ABOUT:  Any allergies you have.  All medicines you are taking, including vitamins, herbs, eye drops, creams, and over-the-counter medicines.  Previous problems you or members of your family have had with the use of anesthetics.  Any blood disorders you have.  Previous surgeries you have had.  Medical conditions you have.  Possibility of pregnancy, if this applies. BEFORE THE PROCEDURE  No special preparation is needed. Eat and drink normally.  PROCEDURE   In order to produce an image of your heart, gel will be applied to your chest and a wand-like tool (transducer) will be moved over your chest. The gel will help transmit the sound waves from the transducer. The sound waves will harmlessly bounce off your heart to allow the heart images to be captured in real-time motion. These images will then be recorded.  You may need an IV to receive a medicine that improves the quality of the pictures. AFTER THE PROCEDURE You may return to your normal schedule including diet, activities, and medicines, unless your health care provider tells you otherwise.   This information is not intended to replace advice given to you by your health care provider. Make sure you discuss any questions you have with your health care provider.   Document Released: 12/18/1999 Document Revised: 01/10/2014 Document Reviewed: 08/27/2012 Elsevier Interactive Patient Education  2016 [place].   Pharmacologic Stress Electrocardiogram A pharmacologic stress electrocardiogram is a heart (cardiac) test that uses nuclear imaging to evaluate the blood supply to your heart. This test may also be called a pharmacologic stress electrocardiography. Pharmacologic means that a medicine is used to increase your heart rate and blood pressure.  This stress test is done to find areas of poor blood flow to the heart by determining the extent of coronary artery disease (CAD). Some people exercise on a treadmill, which naturally increases the blood flow to the heart. For those people unable to exercise on a treadmill, a medicine is used. This medicine stimulates your heart and will cause your heart to beat harder and more quickly, as if you were exercising.  Pharmacologic stress tests can help determine:  The adequacy of blood flow to your heart during increased levels of activity in order to clear you for discharge home.  The extent of coronary artery blockage caused by CAD.  Your prognosis if you have suffered a heart attack.  The effectiveness of cardiac procedures done, such as an angioplasty, which can increase the circulation in your coronary arteries.  Causes of chest pain or pressure. LET [facility] CARE PROVIDER KNOW ABOUT:  Any allergies you have.  All medicines you are taking, including vitamins, herbs, eye drops, creams, and over-the-counter medicines.  Previous problems you or members of your family have had with the use of anesthetics.  Any blood disorders you have.  Previous surgeries you have had.  Medical conditions you  have.  Possibility of pregnancy, if this applies.  If you are currently breastfeeding. RISKS AND COMPLICATIONS Generally, this is a safe procedure. However, as with any procedure, complications can occur. Possible complications include:  You develop pain or pressure in the following areas:  Chest.  Jaw or neck.  Between your  shoulder blades.  Radiating down your left arm.  Headache.  Dizziness or light-headedness.  Shortness of breath.  Increased or irregular heartbeat.  Low blood pressure.  Nausea or vomiting.  Flushing.  Redness going up the arm and slight pain during injection of medicine.  Heart attack (rare). BEFORE THE PROCEDURE   Avoid all forms of caffeine for 24 hours before your test or as directed by your health care provider. This includes coffee, tea (even decaffeinated tea), caffeinated sodas, chocolate, cocoa, and certain pain medicines.  Follow your health care provider's instructions regarding eating and drinking before the test.  Take your medicines as directed at regular times with water unless instructed otherwise. Exceptions may include:  If you have diabetes, ask how you are to take your insulin or pills. It is common to adjust insulin dosing the morning of the test.  If you are taking beta-blocker medicines, it is important to talk to your health care provider about these medicines well before the date of your test. Taking beta-blocker medicines may interfere with the test. In some cases, these medicines need to be changed or stopped 24 hours or more before the test.  If you wear a nitroglycerin patch, it may need to be removed prior to the test. Ask your health care provider if the patch should be removed before the test.  If you use an inhaler for any breathing condition, bring it with you to the test.  If you are an outpatient, bring a snack so you can eat right after the stress phase of the test.  Do not smoke for 4 hours prior to the test or as directed by your health care provider.  Do not apply lotions, powders, creams, or oils on your chest prior to the test.  Wear comfortable shoes and clothing. Let your health care provider know if you were unable to complete or follow the preparations for your test. PROCEDURE   Multiple patches (electrodes) will be put on  your chest. If needed, small areas of your chest may be shaved to get better contact with the electrodes. Once the electrodes are attached to your body, multiple wires will be attached to the electrodes, and your heart rate will be monitored.  An IV access will be started. A nuclear trace (isotope) is given. The isotope may be given intravenously, or it may be swallowed. Nuclear refers to several types of radioactive isotopes, and the nuclear isotope lights up the arteries so that the nuclear images are clear. The isotope is absorbed by your body. This results in low radiation exposure.  A resting nuclear image is taken to show how your heart functions at rest.  A medicine is given through the IV access.  A second scan is done about 1 hour after the medicine injection and determines how your heart functions under stress.  During this stress phase, you will be connected to an electrocardiogram machine. Your blood pressure and oxygen levels will be monitored. AFTER THE PROCEDURE   Your heart rate and blood pressure will be monitored after the test.  You may return to your normal schedule, including diet,activities, and medicines, unless your health care provider tells you otherwise.  This information is not intended to replace advice given to you by your health care provider. Make sure you discuss any questions you have with your health care provider.   Document Released: 05/08/2008 Document Revised: 12/25/2012 Document Reviewed: 08/27/2012 Elsevier Interactive Patient Education [employer].

## 2015-04-03 ENCOUNTER — Other Ambulatory Visit: Payer: Self-pay | Admitting: Internal Medicine

## 2015-04-03 DIAGNOSIS — R42 Dizziness and giddiness: Secondary | ICD-10-CM

## 2015-04-03 NOTE — Progress Notes (Signed)
Order placed for neurology referral.   

## 2015-04-06 DIAGNOSIS — J301 Allergic rhinitis due to pollen: Secondary | ICD-10-CM | POA: Diagnosis not present

## 2015-04-08 ENCOUNTER — Telehealth: Payer: Self-pay | Admitting: Cardiology

## 2015-04-08 NOTE — Telephone Encounter (Signed)
Spoke with patient and reviewed instructions for stress test tomorrow. She verbalized understanding of all instructions and had no further questions at this time.

## 2015-04-09 ENCOUNTER — Encounter
Admission: RE | Admit: 2015-04-09 | Discharge: 2015-04-09 | Disposition: A | Discharge status: Home / Self Care | Payer: PPO | Source: Ambulatory Visit | Attending: Cardiology | Admitting: Cardiology

## 2015-04-09 DIAGNOSIS — I10 Essential (primary) hypertension: Secondary | ICD-10-CM | POA: Diagnosis not present

## 2015-04-09 DIAGNOSIS — R42 Dizziness and giddiness: Secondary | ICD-10-CM | POA: Diagnosis not present

## 2015-04-09 DIAGNOSIS — R2681 Unsteadiness on feet: Secondary | ICD-10-CM | POA: Diagnosis not present

## 2015-04-09 LAB — NM MYOCAR MULTI W/SPECT W/WALL MOTION / EF
Estimated workload: 1 METS
Exercise duration (min): 0 min
Exercise duration (sec): 0 s
LV dias vol: 84 mL (ref 46–106)
LV sys vol: 42 mL
MPHR: 138 {beats}/min
Peak HR: 90 {beats}/min
Percent HR: 65 %
Rest HR: 78 {beats}/min
SDS: 2
SRS: 2
SSS: 4
TID: 1.09

## 2015-04-09 MED ORDER — TECHNETIUM TC 99M SESTAMIBI - CARDIOLITE
13.6600 | Freq: Once | INTRAVENOUS | Status: AC | PRN
  Administered 2015-04-09: 08:00:00 13.66 via INTRAVENOUS

## 2015-04-09 MED ORDER — REGADENOSON 0.4 MG/5ML IV SOLN
0.4000 mg | Freq: Once | INTRAVENOUS | Status: AC
  Administered 2015-04-09: 0.4 mg via INTRAVENOUS

## 2015-04-09 MED ORDER — TECHNETIUM TC 99M SESTAMIBI - CARDIOLITE
30.7200 | Freq: Once | INTRAVENOUS | Status: AC | PRN
  Administered 2015-04-09: 09:00:00 30.72 via INTRAVENOUS

## 2015-04-13 ENCOUNTER — Other Ambulatory Visit: Payer: Self-pay | Admitting: Internal Medicine

## 2015-04-13 DIAGNOSIS — J301 Allergic rhinitis due to pollen: Secondary | ICD-10-CM | POA: Diagnosis not present

## 2015-04-20 ENCOUNTER — Other Ambulatory Visit: Payer: PPO

## 2015-04-21 ENCOUNTER — Encounter: Payer: Self-pay | Admitting: Emergency Medicine

## 2015-04-21 ENCOUNTER — Ambulatory Visit
Admission: EM | Admit: 2015-04-21 | Discharge: 2015-04-21 | Disposition: A | Discharge status: Home / Self Care | Payer: PPO | Attending: Family Medicine | Admitting: Family Medicine

## 2015-04-21 DIAGNOSIS — J209 Acute bronchitis, unspecified: Secondary | ICD-10-CM

## 2015-04-21 DIAGNOSIS — J42 Unspecified chronic bronchitis: Secondary | ICD-10-CM

## 2015-04-21 MED ORDER — HYDROCOD POLST-CPM POLST ER 10-8 MG/5ML PO SUER: 2.5000 mL | Freq: Two times a day (BID) | ORAL | Status: DC

## 2015-04-21 MED ORDER — DOXYCYCLINE HYCLATE 100 MG PO CAPS: 100.0000 mg | ORAL_CAPSULE | Freq: Two times a day (BID) | ORAL | Status: DC

## 2015-04-21 NOTE — ED Provider Notes (Signed)
CSN: [id number]     Arrival date & time 04/21/15  4098 History   First MD Initiated Contact with Patient 04/21/15 1058     Chief Complaint  Patient presents with  . Cough   (Consider location/radiation/quality/duration/timing/severity/associated sxs/prior Treatment) HPI  82 year old female who presents with cough and chest congestion that started 2 days ago. He said no fever or chills. Days that she is not able to sleep because of the cough and it has been constant. The cough is only been mildly productive. He has not had any wheezing. She does have a history of allergies and takes Allegra and Flonase on a daily basis. In addition she does have allergy shots.     Past Medical History  Diagnosis Date  . Diabetes mellitus without complication ([place])   . Hypothyroidism   . GERD (gastroesophageal reflux disease)   . Hypertension   . Allergy   . Spinal stenosis     lumbar laminectomy  . PUD (peptic ulcer disease)   . Gastroparesis   . Diverticulosis   . Carpal tunnel syndrome, bilateral   . 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 Skin cancer    Past Surgical History  Procedure Laterality Date  . Gastric lymphoma  1996    surgery  . Partial hysterectomy  1960    fibroid tumors, ovaries not removed  . Lumbar laminectomy  1999  . Laparoscopic cholecystectomy  6/01  . Carpal tunnel repai  10/10    left   . Arthroscopic left knee  12/05/08  . Antrectomy and vagotomy  1994  . Tonsillectomy    . Cataract extraction, bilateral    . Abdominal hysterectomy     Family History  Problem Relation Age of Onset  . Stroke Mother   . Stroke Father   . Hypertension Father   . Diabetes Brother   . Breast cancer Daughter 43  . Colon cancer Neg Hx    Social History  Substance Use Topics  . Smoking status: Never Smoker   . Smokeless tobacco: Never Used  . Alcohol Use: No   OB History    No data available     Review of Systems  Constitutional: Positive for activity change. Negative for fever,  chills and fatigue.  HENT: Positive for congestion, postnasal drip, rhinorrhea, sinus pressure and sneezing.   Respiratory: Positive for cough. Negative for shortness of breath, wheezing and stridor.   All other systems reviewed and are negative.   Allergies  Penicillins  Home Medications   Prior to Admission medications   Medication Sig Start Date End Date Taking? Authorizing Provider  aspirin 81 MG tablet Take 81 mg by mouth daily.   Yes Historical Provider, MD  ACCU-CHEK AVIVA PLUS test strip CHECK SUGAR 4 TIMES DAILY DX E11.8 12/23/14   Lavone Nian, MD  acetaminophen (TYLENOL) 500 MG tablet Take 500 mg by mouth every 6 (six) hours as needed.    Historical Provider, MD  albuterol (PROVENTIL HFA;VENTOLIN HFA) 108 (90 BASE) MCG/ACT inhaler Inhale 2 puffs into the lungs every 6 (six) hours as needed for wheezing or shortness of breath. 09/03/13   Einar Pheasant, MD  amLODipine (NORVASC) 5 MG tablet TAKE 1 TABLET (5 MG TOTAL) BY MOUTH DAILY. 12/16/14   Einar Pheasant, MD  blood glucose meter kit and supplies KIT Dispense based on patient and insurance preference. Use up to four times daily as directed. (FOR ICD-10:E11.65) 02/06/15   Einar Pheasant, MD  chlorpheniramine-HYDROcodone [facility]) 10-8 MG/5ML SUER Take  2.5 mLs by mouth 2 (two) times daily. PRN cough 04/21/15   Lorin Picket, PA-C  Cholecalciferol (VITAMIN D-3 PO) Take 1 capsule by mouth daily.    Historical Provider, MD  doxycycline (VIBRAMYCIN) 100 MG capsule Take 1 capsule (100 mg total) by mouth 2 (two) times daily. 04/21/15   Lorin Picket, PA-C  fexofenadine (ALLEGRA) 180 MG tablet Take 180 mg by mouth daily.    Historical Provider, MD  fluticasone (FLONASE) 50 MCG/ACT nasal spray Place 2 sprays into both nostrils daily. 09/03/13   Einar Pheasant, MD  glucagon (GLUCAGON EMERGENCY) 1 MG injection Inject 1 mg into the muscle once as needed. 08/13/13   Haydee Monica, MD  glucose blood test strip Use as  instructed 02/06/15   Einar Pheasant, MD  imipramine (TOFRANIL) 25 MG tablet Take 25 mg by mouth at bedtime.    Historical Provider, MD  insulin glargine (LANTUS) 100 UNIT/ML injection Inject 0.33 mLs (33 Units total) into the skin at bedtime. Pens please  - SOLOSTAR 03/17/14   Haydee Monica, MD  insulin lispro (HUMALOG KWIKPEN) 100 UNIT/ML KiwkPen Inject on a sliding scale between 14-28 units with each meal. Max daily dose 90 10/23/13   Radhika P Phadke, MD  Lancets (ACCU-CHEK SOFT TOUCH) lancets Use as instructed. Dx E11.8 02/06/15   Einar Pheasant, MD  LORazepam (ATIVAN) 0.5 MG tablet 1/2 tablet q day prn 10/27/14   Einar Pheasant, MD  metFORMIN (GLUCOPHAGE-XR) 500 MG 24 hr tablet TAKE 1 TABLET (500 MG TOTAL) BY MOUTH 2 (TWO) TIMES DAILY. 02/05/15   Einar Pheasant, MD  Multiple Vitamins-Minerals (MULTIVITAMIN PO) Take by mouth daily.     Historical Provider, MD  Multiple Vitamins-Minerals (PRESERVISION AREDS PO) Take by mouth 2 (two) times daily.    Historical Provider, MD  pantoprazole (PROTONIX) 40 MG tablet TAKE 1 TABLET (40 MG TOTAL) BY MOUTH 2 (TWO) TIMES DAILY. 10/29/14   Einar Pheasant, MD  SYNTHROID 175 MCG tablet TAKE 1 TABLET (175 MCG TOTAL) BY MOUTH DAILY BEFORE BREAKFAST. 04/13/15   Einar Pheasant, MD  traMADol (ULTRAM) 50 MG tablet 1 to 2 tabs every 4 to six hours    Historical Provider, MD  valsartan-hydrochlorothiazide (DIOVAN-HCT) 160-12.5 MG per tablet Take 1 tablet by mouth daily. 05/08/14   Einar Pheasant, MD   Meds Ordered and Administered this Visit  Medications - No data to display  BP 135/62 mmHg  Pulse 85  Temp(Src) 98.9 F (37.2 C) (Tympanic)  Resp 16  Ht 5' 3"  (1.6 m)  Wt 171 lb (77.565 kg)  BMI 30.30 kg/m2  SpO2 100% No data found.   Physical Exam  Constitutional: She is oriented to person, place, and time. She appears well-developed and well-nourished. No distress.  HENT:  Head: Normocephalic and atraumatic.  Right Ear: External ear normal.  Left Ear:  External ear normal.  Mouth/Throat: Oropharynx is clear and moist.  Eyes: Conjunctivae are normal. Pupils are equal, round, and reactive to light. Right eye exhibits no discharge. Left eye exhibits no discharge.  Neck: Normal range of motion. Neck supple.  Pulmonary/Chest: Effort normal and breath sounds normal. No respiratory distress. She has no wheezes. She has no rales.  Musculoskeletal: Normal range of motion. She exhibits no edema or tenderness.  Lymphadenopathy:    She has no cervical adenopathy.  Neurological: She is alert and oriented to person, place, and time.  Skin: Skin is warm and dry. She is not diaphoretic.  Psychiatric: She has a normal mood and  affect. Her behavior is normal. Judgment and thought content normal.  Nursing note and vitals reviewed.   ED Course  Procedures (including critical care time)  Labs Review Labs Reviewed - No data to display  Imaging Review No results found.   Visual Acuity Review  Right Eye Distance:   Left Eye Distance:   Bilateral Distance:    Right Eye Near:   Left Eye Near:    Bilateral Near:         MDM   1. Chronic bronchitis with acute exacerbation [facility])    Discharge Medication List as of 04/21/2015 11:24 AM    START taking these medications   Details  chlorpheniramine-HYDROcodone (TUSSIONEX PENNKINETIC ER) 10-8 MG/5ML SUER Take 2.5 mLs by mouth 2 (two) times daily. PRN cough, Starting 04/21/2015, Until Discontinued, Print    doxycycline (VIBRAMYCIN) 100 MG capsule Take 1 capsule (100 mg total) by mouth 2 (two) times daily., Starting 04/21/2015, Until Discontinued, Normal      Plan: 1. Test/x-ray results and diagnosis reviewed with patient 2. rx as per orders; risks, benefits, potential side effects reviewed with patient 3. Recommend supportive treatment with Rest and fluids as necessary. I told patient that this is most likely a viral illness and probably prominent of seasonal allergies due to the high pollen count  that we have been experiencing in the area. However the patient is adamant that she must have antibiotics because "I know my body". At her last visit to the clinic she received the "granddaddy" of all antibiotics and checking was given Levaquin. I told her that Levaquin is not indicated and has serious side effects will place her on a 7 day course of doxycycline due to her advanced age. In addition I've given her some.Tussionex that she can use at nighttime and a lower dosage that she may get rest. Is used [employer] the past which are absolutely useless". She may use the Delsym over-the-counter cough syrup to help suppress the cough started a time. If she is not improving she should follow-up with Dr. Nicki Reaper her primary care physician. 4. F/u prn if symptoms worsen or don't improve     Lorin Picket, PA-C 04/21/15 1204

## 2015-04-21 NOTE — Discharge Instructions (Signed)
Chronic Bronchitis Chronic bronchitis is a lasting inflammation of the bronchial tubes, which are the tubes that carry air into your lungs. This is inflammation that occurs:   On most days of the week.   For at least three months at a time.   Over a period of two years in a row. When the bronchial tubes are inflamed, they start to produce mucus. The inflammation and buildup of mucus make it more difficult to breathe. Chronic bronchitis is usually a permanent problem and is one type of chronic obstructive pulmonary disease (COPD). People with chronic bronchitis are at greater risk for getting repeated colds, or respiratory infections. CAUSES  Chronic bronchitis most often occurs in people who have:  Long-standing, severe asthma.  A history of smoking.  Asthma and who also smoke. SIGNS AND SYMPTOMS  Chronic bronchitis may cause the following:   A cough that brings up mucus (productive cough).  Shortness of breath.  Early morning headache.  Wheezing.  Chest discomfort.   Recurring respiratory infections. DIAGNOSIS  Your health care provider may confirm the diagnosis by:  Taking your medical history.  Performing a physical exam.  Taking a chest X-ray.   Performing pulmonary function tests. TREATMENT  Treatment involves controlling symptoms with medicines, oxygen therapy, or making lifestyle changes, such as exercising and eating a healthy, well-balanced diet. Medicines could include:  Inhalers to improve air flow in and out of your lungs.  Antibiotics to treat bacterial infections, such as pneumonia, sinus infections, and acute bronchitis. As a preventative measure, your health care provider may recommend routine vaccinations for influenza and pneumonia. This is to prevent infection and hospitalization since you may be more at risk for these types of infections.  HOME CARE INSTRUCTIONS  Take medicines only as directed by your health care provider.   If you smoke  cigarettes, chew tobacco, or use electronic cigarettes, quit. If you need help quitting, ask your health care provider.  Avoid pollen, dust, animal dander, molds, smoke, and other things that cause shortness of breath or wheezing attacks.  Talk to your health care provider about possible exercise routines. Regular exercise is very important to help you feel better.  If you are prescribed oxygen use at home follow these guidelines:  Never smoke while using oxygen. Oxygen does not burn or explode, but flammable materials will burn faster in the presence of oxygen.  Keep a fire extinguisher close by. Let your fire department know that you have oxygen in your home.  Warn visitors not to smoke near you when you are using oxygen. Put up "no smoking" signs in your home where you most often use the oxygen.  Regularly test your smoke detectors at home to make sure they work. If you receive care in your home from a nurse or other health care provider, he or she may also check to make sure your smoke detectors work.  Ask your health care provider whether you would benefit from a pulmonary rehabilitation program.  Do not wait to get medical care if you have any concerning symptoms. Delays could cause permanent injury and may be life threatening. SEEK MEDICAL CARE IF:  You have increased coughing or shortness of breath or both.  You have muscle aches.  You have chest pain.  Your mucus gets thicker.  Your mucus changes from clear or white to yellow, green, gray, or bloody. SEEK IMMEDIATE MEDICAL CARE IF:  Your usual medicines do not stop your wheezing.   You have increased difficulty breathing.     You have any problems with the medicine you are taking, such as a rash, itching, swelling, or trouble breathing. MAKE SURE YOU:   Understand these instructions.  Will watch your condition.  Will get help right away if you are not doing well or get worse.   This information is not intended to  replace advice given to you by your health care provider. Make sure you discuss any questions you have with your health care provider.   Document Released: 10/07/2005 Document Revised: 01/10/2014 Document Reviewed: 01/28/2013 Elsevier Interactive Patient Education 2016 Elsevier Inc.  

## 2015-04-21 NOTE — ED Notes (Signed)
Patient c/o cough and chest congestion, runny nose that started Sunday.  Patient denies fevers.

## 2015-04-22 ENCOUNTER — Other Ambulatory Visit: Payer: Self-pay | Admitting: Internal Medicine

## 2015-04-27 DIAGNOSIS — J301 Allergic rhinitis due to pollen: Secondary | ICD-10-CM | POA: Diagnosis not present

## 2015-04-29 ENCOUNTER — Telehealth: Payer: Self-pay | Admitting: Cardiology

## 2015-04-29 NOTE — Telephone Encounter (Signed)
Spoke with husband .  Patient was sick before and had to cancel echo and carotid.  Would Dr. Yvone Neu prefer to r/s fu until after these are done or have her go ahead and come in fu just from nm study?

## 2015-04-29 NOTE — Telephone Encounter (Signed)
Patient will need to reschedule follow up appointment after all testing has been completed so Dr. Yvone Neu can review and provide results on all testing. Please let me know if I need to call patient. Thanks

## 2015-04-29 NOTE — Telephone Encounter (Signed)
lmov to r/s cancelled carotid and echo prior to fu appt next week.

## 2015-05-04 DIAGNOSIS — J301 Allergic rhinitis due to pollen: Secondary | ICD-10-CM | POA: Diagnosis not present

## 2015-05-05 ENCOUNTER — Ambulatory Visit: Payer: PPO | Admitting: Cardiology

## 2015-05-11 DIAGNOSIS — J301 Allergic rhinitis due to pollen: Secondary | ICD-10-CM | POA: Diagnosis not present

## 2015-05-12 ENCOUNTER — Ambulatory Visit: Payer: PPO | Admitting: Internal Medicine

## 2015-05-14 DIAGNOSIS — J301 Allergic rhinitis due to pollen: Secondary | ICD-10-CM | POA: Diagnosis not present

## 2015-05-18 ENCOUNTER — Ambulatory Visit (INDEPENDENT_AMBULATORY_CARE_PROVIDER_SITE_OTHER): Payer: PPO

## 2015-05-18 ENCOUNTER — Ambulatory Visit: Payer: PPO

## 2015-05-18 ENCOUNTER — Other Ambulatory Visit: Payer: Self-pay

## 2015-05-18 DIAGNOSIS — R2681 Unsteadiness on feet: Secondary | ICD-10-CM

## 2015-05-18 DIAGNOSIS — R42 Dizziness and giddiness: Secondary | ICD-10-CM

## 2015-05-18 DIAGNOSIS — R0989 Other specified symptoms and signs involving the circulatory and respiratory systems: Secondary | ICD-10-CM | POA: Diagnosis not present

## 2015-05-18 DIAGNOSIS — I10 Essential (primary) hypertension: Secondary | ICD-10-CM | POA: Diagnosis not present

## 2015-05-19 DIAGNOSIS — R42 Dizziness and giddiness: Secondary | ICD-10-CM | POA: Diagnosis not present

## 2015-05-21 ENCOUNTER — Ambulatory Visit (INDEPENDENT_AMBULATORY_CARE_PROVIDER_SITE_OTHER): Payer: PPO | Admitting: Internal Medicine

## 2015-05-21 ENCOUNTER — Encounter: Payer: Self-pay | Admitting: Internal Medicine

## 2015-05-21 VITALS — BP 120/70 | HR 72 | Temp 97.9°F | Wt 176.6 lb

## 2015-05-21 DIAGNOSIS — I10 Essential (primary) hypertension: Secondary | ICD-10-CM | POA: Diagnosis not present

## 2015-05-21 DIAGNOSIS — E669 Obesity, unspecified: Secondary | ICD-10-CM | POA: Diagnosis not present

## 2015-05-21 DIAGNOSIS — R42 Dizziness and giddiness: Secondary | ICD-10-CM

## 2015-05-21 DIAGNOSIS — E1122 Type 2 diabetes mellitus with diabetic chronic kidney disease: Secondary | ICD-10-CM

## 2015-05-21 DIAGNOSIS — E039 Hypothyroidism, unspecified: Secondary | ICD-10-CM

## 2015-05-21 DIAGNOSIS — E1165 Type 2 diabetes mellitus with hyperglycemia: Secondary | ICD-10-CM

## 2015-05-21 DIAGNOSIS — D649 Anemia, unspecified: Secondary | ICD-10-CM

## 2015-05-21 DIAGNOSIS — N182 Chronic kidney disease, stage 2 (mild): Secondary | ICD-10-CM

## 2015-05-21 DIAGNOSIS — IMO0001 Reserved for inherently not codable concepts without codable children: Secondary | ICD-10-CM

## 2015-05-21 DIAGNOSIS — E7800 Pure hypercholesterolemia, unspecified: Secondary | ICD-10-CM

## 2015-05-21 DIAGNOSIS — Z794 Long term (current) use of insulin: Secondary | ICD-10-CM

## 2015-05-21 MED ORDER — GLUCAGON (RDNA) 1 MG IJ KIT: 1.0000 mg | PACK | Freq: Once | INTRAMUSCULAR | Status: DC | PRN

## 2015-05-21 MED ORDER — FLUTICASONE PROPIONATE 50 MCG/ACT NA SUSP: 2.0000 | Freq: Every day | NASAL | Status: DC

## 2015-05-21 NOTE — Progress Notes (Signed)
Pre visit review using our clinic review tool, if applicable. No additional management support is needed unless otherwise documented below in the visit note. 

## 2015-05-21 NOTE — Progress Notes (Signed)
Patient ID: Marissa Huff, female   DOB: Mar 02, 1931, 82 y.o.   MRN: [id number]   Subjective:    Patient ID: Marissa Huff, female    DOB: [birth year], 82 y.o.   MRN: [id number]  HPI  Patient here for a scheduled follow up.  She is accompanied by her husband.  History obtained from both of them.  She states she has been doing relatively well.  Has been very active recently - working in her yard.  No chest pain.  Breathing stable.  States her sugars in am averaging 118-123 and pm sugars (q hs) averaging 140s.  States had low sugar over the last 24 hours.  She worked outside and did a lot of physical activity.  Did not eat a snack before bed.  Low sugars have not been an issue for her lately.  Discussed diet.  Discussed the need to eat regular meals and appropriate snacks.  Also discussed eating a bedtime snack.  She sees Dr Eddie Dibbles and is due for f/u soon.  No further dizziness/light headedness.  Carotid ultrasound revealed no significant stenosis.     Past Medical History  Diagnosis Date  . Diabetes mellitus without complication ([place])   . Hypothyroidism   . GERD (gastroesophageal reflux disease)   . Hypertension   . Allergy   . Spinal stenosis     lumbar laminectomy  . PUD (peptic ulcer disease)   . Gastroparesis   . Diverticulosis   . Carpal tunnel syndrome, bilateral   . 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 Skin cancer    Past Surgical History  Procedure Laterality Date  . Gastric lymphoma  1996    surgery  . Partial hysterectomy  1960    fibroid tumors, ovaries not removed  . Lumbar laminectomy  1999  . Laparoscopic cholecystectomy  6/01  . Carpal tunnel repai  10/10    left   . Arthroscopic left knee  12/05/08  . Antrectomy and vagotomy  1994  . Tonsillectomy    . Cataract extraction, bilateral    . Abdominal hysterectomy     Family History  Problem Relation Age of Onset  . Stroke Mother   . Stroke Father   . Hypertension Father   . Diabetes Brother   . Breast cancer  Daughter 80  . Colon cancer Neg Hx    Social History   Social History  . Marital Status: Married    Spouse Name: N/A  . Number of Children: N/A  . Years of Education: N/A   Social History Main Topics  . Smoking status: Never Smoker   . Smokeless tobacco: Never Used  . Alcohol Use: No  . Drug Use: No  . Sexual Activity: No   Other Topics Concern  . None   Social History Narrative    Outpatient Encounter Prescriptions as of 05/21/2015  Medication Sig  . ACCU-CHEK AVIVA PLUS test strip CHECK SUGAR 4 TIMES DAILY DX E11.8  . acetaminophen (TYLENOL) 500 MG tablet Take 500 mg by mouth every 6 (six) hours as needed.  Marland Kitchen albuterol (PROVENTIL HFA;VENTOLIN HFA) 108 (90 BASE) MCG/ACT inhaler Inhale 2 puffs into the lungs every 6 (six) hours as needed for wheezing or shortness of breath.  Marland Kitchen amLODipine (NORVASC) 5 MG tablet TAKE 1 TABLET (5 MG TOTAL) BY MOUTH DAILY.  Marland Kitchen aspirin 81 MG tablet Take 81 mg by mouth daily.  . blood glucose meter kit and supplies KIT Dispense based on patient and insurance preference. Use up to  four times daily as directed. (FOR ICD-10:E11.65)  . chlorpheniramine-HYDROcodone (TUSSIONEX PENNKINETIC ER) 10-8 MG/5ML SUER Take 2.5 mLs by mouth 2 (two) times daily. PRN cough  . Cholecalciferol (VITAMIN D-3 PO) Take 1 capsule by mouth daily.  Marland Kitchen doxycycline (VIBRAMYCIN) 100 MG capsule Take 1 capsule (100 mg total) by mouth 2 (two) times daily.  . fexofenadine (ALLEGRA) 180 MG tablet Take 180 mg by mouth daily.  . fluticasone (FLONASE) 50 MCG/ACT nasal spray Place 2 sprays into both nostrils daily.  Marland Kitchen glucagon (GLUCAGON EMERGENCY) 1 MG injection Inject 1 mg into the muscle once as needed.  Marland Kitchen glucose blood test strip Use as instructed  . imipramine (TOFRANIL) 25 MG tablet Take 25 mg by mouth at bedtime.  . insulin glargine (LANTUS) 100 UNIT/ML injection Inject 0.33 mLs (33 Units total) into the skin at bedtime. Pens please  - SOLOSTAR  . insulin lispro (HUMALOG KWIKPEN) 100  UNIT/ML KiwkPen Inject on a sliding scale between 14-28 units with each meal. Max daily dose 90  . Lancets (ACCU-CHEK SOFT TOUCH) lancets Use as instructed. Dx E11.8  . LORazepam (ATIVAN) 0.5 MG tablet 1/2 tablet q day prn  . metFORMIN (GLUCOPHAGE-XR) 500 MG 24 hr tablet TAKE 1 TABLET (500 MG TOTAL) BY MOUTH 2 (TWO) TIMES DAILY.  . Multiple Vitamins-Minerals (MULTIVITAMIN PO) Take by mouth daily.   . Multiple Vitamins-Minerals (PRESERVISION AREDS PO) Take by mouth 2 (two) times daily.  . pantoprazole (PROTONIX) 40 MG tablet TAKE 1 TABLET (40 MG TOTAL) BY MOUTH 2 (TWO) TIMES DAILY.  . SYNTHROID 175 MCG tablet TAKE 1 TABLET (175 MCG TOTAL) BY MOUTH DAILY BEFORE BREAKFAST.  Marland Kitchen traMADol (ULTRAM) 50 MG tablet 1 to 2 tabs every 4 to six hours  . valsartan-hydrochlorothiazide (DIOVAN-HCT) 160-12.5 MG per tablet Take 1 tablet by mouth daily.  . [DISCONTINUED] fluticasone (FLONASE) 50 MCG/ACT nasal spray Place 2 sprays into both nostrils daily.  . [DISCONTINUED] glucagon (GLUCAGON EMERGENCY) 1 MG injection Inject 1 mg into the muscle once as needed.   No facility-administered encounter medications on file as of 05/21/2015.    Review of Systems  Constitutional: Negative for appetite change and unexpected weight change.  HENT: Negative for congestion and sinus pressure.   Respiratory: Negative for cough, chest tightness and shortness of breath.   Cardiovascular: Negative for chest pain, palpitations and leg swelling.  Gastrointestinal: Negative for nausea, vomiting, abdominal pain and diarrhea.  Genitourinary: Negative for dysuria and difficulty urinating.  Musculoskeletal: Negative for myalgias and joint swelling.  Skin: Negative for color change and rash.  Neurological: Negative for dizziness (no further episodes. ), light-headedness and headaches.  Psychiatric/Behavioral: Negative for dysphoric mood and agitation.       Objective:    Physical Exam  Constitutional: She appears well-developed  and well-nourished. No distress.  HENT:  Nose: Nose normal.  Mouth/Throat: Oropharynx is clear and moist.  Neck: Neck supple. No thyromegaly present.  Cardiovascular: Normal rate and regular rhythm.   Pulmonary/Chest: Breath sounds normal. No respiratory distress. She has no wheezes.  Abdominal: Soft. Bowel sounds are normal. There is no tenderness.  Musculoskeletal: She exhibits no edema or tenderness.  Lymphadenopathy:    She has no cervical adenopathy.  Skin: No rash noted. No erythema.  Psychiatric: She has a normal mood and affect. Her behavior is normal.    BP 120/70 mmHg  Pulse 72  Temp(Src) 97.9 F (36.6 C) (Oral)  Wt 176 lb 9.6 oz (80.105 kg) Wt Readings from Last 3 Encounters:  05/21/15  176 lb 9.6 oz (80.105 kg)  04/21/15 171 lb (77.565 kg)  04/01/15 175 lb 4 oz (79.493 kg)     Lab Results  Component Value Date   WBC 4.9 03/24/2015   HGB 11.7* 03/24/2015   HCT 34.0* 03/24/2015   PLT 208.0 03/24/2015   GLUCOSE 202* 03/24/2015   CHOL 141 03/24/2015   TRIG 68.0 03/24/2015   HDL 50.40 03/24/2015   LDLCALC 77 03/24/2015   ALT 14 03/24/2015   AST 15 03/24/2015   NA 139 03/24/2015   K 4.5 03/24/2015   CL 104 03/24/2015   CREATININE 0.77 03/24/2015   BUN 19 03/24/2015   CO2 26 03/24/2015   TSH 0.91 06/11/2014   HGBA1C 6.9* 03/24/2015   MICROALBUR 0.9 06/11/2014       Assessment & Plan:   Problem List Items Addressed This Visit    CKD stage 2 due to type 2 diabetes mellitus ([place])    Stay hydrated.  Follow met b.       Relevant Medications   glucagon (GLUCAGON EMERGENCY) 1 MG injection   Dizziness    Resolved.  Follow.        Hypercholesterolemia    Low cholesterol diet and exercise.  Follow lipid panel.        Relevant Orders   Lipid panel   Hepatic function panel   Hypertension    Blood pressure under good control.  Continue same medication regimen.  Follow pressures.  Follow metabolic panel.        Hypothyroidism    On thyroid  replacement.  Follow tsh.       Relevant Orders   TSH   Obesity (BMI 30-39.9)    Diet and exercise.        Relevant Medications   glucagon (GLUCAGON EMERGENCY) 1 MG injection   Type 2 diabetes mellitus, uncontrolled ([facility]) - Primary    Sees Dr Eddie Dibbles.  Had the low sugar as outlined.  Not occurring frequently.  See note.  Discussed eating regular meals and appropriate snacks.  Discussed the need to eat a bedtime snack.  Follow sugars.  Keep f/u with Dr Eddie Dibbles.       Relevant Medications   glucagon (GLUCAGON EMERGENCY) 1 MG injection   Other Relevant Orders   Basic metabolic panel   Microalbumin / creatinine urine ratio   Hemoglobin A1c    Other Visit Diagnoses    Anemia, unspecified anemia type        Relevant Orders    CBC with Differential/Platelet    Ferritin        Einar Pheasant, MD

## 2015-05-25 ENCOUNTER — Encounter: Payer: Self-pay | Admitting: Internal Medicine

## 2015-05-25 DIAGNOSIS — J301 Allergic rhinitis due to pollen: Secondary | ICD-10-CM | POA: Diagnosis not present

## 2015-05-25 NOTE — Assessment & Plan Note (Signed)
On thyroid replacement.  Follow tsh.  

## 2015-05-25 NOTE — Assessment & Plan Note (Signed)
Blood pressure under good control.  Continue same medication regimen.  Follow pressures.  Follow metabolic panel.   

## 2015-05-25 NOTE — Assessment & Plan Note (Signed)
Diet and exercise.   

## 2015-05-25 NOTE — Assessment & Plan Note (Signed)
Resolved. Follow °

## 2015-05-25 NOTE — Assessment & Plan Note (Signed)
Sees Dr Eddie Dibbles.  Had the low sugar as outlined.  Not occurring frequently.  See note.  Discussed eating regular meals and appropriate snacks.  Discussed the need to eat a bedtime snack.  Follow sugars.  Keep f/u with Dr Eddie Dibbles.

## 2015-05-25 NOTE — Assessment & Plan Note (Signed)
Stay hydrated.  Follow met b.

## 2015-05-25 NOTE — Assessment & Plan Note (Signed)
Low cholesterol diet and exercise.  Follow lipid panel.   

## 2015-06-03 ENCOUNTER — Encounter: Payer: Self-pay | Admitting: Cardiology

## 2015-06-03 ENCOUNTER — Ambulatory Visit (INDEPENDENT_AMBULATORY_CARE_PROVIDER_SITE_OTHER): Payer: PPO | Admitting: Cardiology

## 2015-06-03 VITALS — BP 114/56 | HR 89 | Ht 63.0 in | Wt 174.5 lb

## 2015-06-03 DIAGNOSIS — I779 Disorder of arteries and arterioles, unspecified: Secondary | ICD-10-CM

## 2015-06-03 DIAGNOSIS — I10 Essential (primary) hypertension: Secondary | ICD-10-CM

## 2015-06-03 DIAGNOSIS — I739 Peripheral vascular disease, unspecified: Principal | ICD-10-CM

## 2015-06-03 NOTE — Progress Notes (Signed)
  Cardiology Office Note   Date:  06/05/2015 06/03/2015 visit  ID:  Marissa Huff, DOB 01/06/1931, MRN [id number]  Referring Doctor:  SCOTT, CHARLENE, MD   Cardiologist:   Aileen Ingal, MD   Reason for consultation:  Chief Complaint  Patient presents with  . Other    F/u echo and carotid us. Meds reviewed verbally with pt.      History of Present Illness: Marissa Huff is a 82 y.o. female who presents for follow up after testing  Patient has already been referred to neurology for dizziness, unsteady gait.  Patient is doing well no chest pain or shortness of breath.  Patient denies headache, fever, cough, colds, abdominal pain, orthopnea, edema, PND.   ROS:  Please see the history of present illness. Aside from mentioned under HPI, all other systems are reviewed and negative.     Past Medical History  Diagnosis Date  . Diabetes mellitus without complication (HCC)   . Hypothyroidism   . GERD (gastroesophageal reflux disease)   . Hypertension   . Allergy   . Spinal stenosis     lumbar laminectomy  . PUD (peptic ulcer disease)   . Gastroparesis   . Diverticulosis   . Carpal tunnel syndrome, bilateral   . Gastric lymphoma (HCC)   . Skin cancer     Past Surgical History  Procedure Laterality Date  . Gastric lymphoma  1996    surgery  . Partial hysterectomy  1960    fibroid tumors, ovaries not removed  . Lumbar laminectomy  1999  . Laparoscopic cholecystectomy  6/01  . Carpal tunnel repai  10/10    left   . Arthroscopic left knee  12/05/08  . Antrectomy and vagotomy  1994  . Tonsillectomy    . Cataract extraction, bilateral    . Abdominal hysterectomy       reports that she has never smoked. She has never used smokeless tobacco. She reports that she does not drink alcohol or use illicit drugs.   family history includes Breast cancer (age of onset: 48) in her daughter; Diabetes in her brother; Hypertension in her father; Stroke in her father and  mother. There is no history of Colon cancer. Mother underwent open heart surgery for hole in the heart likely congenital, at age 65   Current Outpatient Prescriptions  Medication Sig Dispense Refill  . ACCU-CHEK AVIVA PLUS test strip CHECK SUGAR 4 TIMES DAILY DX E11.8 200 each 5  . acetaminophen (TYLENOL) 500 MG tablet Take 500 mg by mouth every 6 (six) hours as needed.    . albuterol (PROVENTIL HFA;VENTOLIN HFA) 108 (90 BASE) MCG/ACT inhaler Inhale 2 puffs into the lungs every 6 (six) hours as needed for wheezing or shortness of breath. 1 Inhaler 1  . amLODipine (NORVASC) 5 MG tablet TAKE 1 TABLET (5 MG TOTAL) BY MOUTH DAILY. 90 tablet 2  . aspirin 81 MG tablet Take 81 mg by mouth daily.    . blood glucose meter kit and supplies KIT Dispense based on patient and insurance preference. Use up to four times daily as directed. (FOR ICD-10:E11.65) 1 each 0  . Cholecalciferol (VITAMIN D-3 PO) Take 1 capsule by mouth daily.    . fexofenadine (ALLEGRA) 180 MG tablet Take 180 mg by mouth daily.    . fluticasone (FLONASE) 50 MCG/ACT nasal spray Place 2 sprays into both nostrils daily. 48 g 11  . glucagon (GLUCAGON EMERGENCY) 1 MG injection Inject 1 mg into the muscle once   as needed. 1 each 3  . glucose blood test strip Use as instructed 100 each 12  . imipramine (TOFRANIL) 25 MG tablet Take 25 mg by mouth at bedtime.    . insulin glargine (LANTUS) 100 UNIT/ML injection Inject 0.33 mLs (33 Units total) into the skin at bedtime. Pens please  - SOLOSTAR 10 mL 5  . insulin lispro (HUMALOG KWIKPEN) 100 UNIT/ML KiwkPen Inject on a sliding scale between 14-28 units with each meal. Max daily dose 90 90 mL 3  . Lancets (ACCU-CHEK SOFT TOUCH) lancets Use as instructed. Dx E11.8 200 each 5  . LORazepam (ATIVAN) 0.5 MG tablet 1/2 tablet q day prn 30 tablet 0  . metFORMIN (GLUCOPHAGE-XR) 500 MG 24 hr tablet TAKE 1 TABLET (500 MG TOTAL) BY MOUTH 2 (TWO) TIMES DAILY. 60 tablet 5  . Multiple Vitamins-Minerals  (MULTIVITAMIN PO) Take by mouth daily.     . Multiple Vitamins-Minerals (PRESERVISION AREDS PO) Take by mouth 2 (two) times daily.    . pantoprazole (PROTONIX) 40 MG tablet TAKE 1 TABLET (40 MG TOTAL) BY MOUTH 2 (TWO) TIMES DAILY. 180 tablet 1  . SYNTHROID 175 MCG tablet TAKE 1 TABLET (175 MCG TOTAL) BY MOUTH DAILY BEFORE BREAKFAST. 30 tablet 11  . traMADol (ULTRAM) 50 MG tablet 1 to 2 tabs every 4 to six hours    . valsartan-hydrochlorothiazide (DIOVAN-HCT) 160-12.5 MG per tablet Take 1 tablet by mouth daily. 90 tablet 3   No current facility-administered medications for this visit.    Allergies: Penicillins    PHYSICAL EXAM: VS:  BP 114/56 mmHg  Pulse 89  Ht 5' 3" (1.6 m)  Wt 174 lb 8 oz (79.153 kg)  BMI 30.92 kg/m2 , Body mass index is 30.92 kg/(m^2). Wt Readings from Last 3 Encounters:  06/03/15 174 lb 8 oz (79.153 kg)  05/21/15 176 lb 9.6 oz (80.105 kg)  04/21/15 171 lb (77.565 kg)    GENERAL:  well developed, well nourished,  obese, not in acute distress HEENT: normocephalic, pink conjunctivae, anicteric sclerae, no xanthelasma, normal dentition, oropharynx clear NECK:  no neck vein engorgement, JVP normal, no hepatojugular reflux, carotid upstroke brisk and symmetric,  soft bruit noted on the right, no thyromegaly, no lymphadenopathy LUNGS:  good respiratory effort, clear to auscultation bilaterally CV:  PMI not displaced, no thrills, no lifts, S1 and S2 within normal limits, no palpable S3 or S4, no murmurs, no rubs, no gallops ABD:  Soft, nontender, nondistended, normoactive bowel sounds, no abdominal aortic bruit, no hepatomegaly, no splenomegaly MS: nontender back, no kyphosis, no scoliosis, no joint deformities EXT:  2+ DP/PT pulses, no edema, no varicosities, no cyanosis, no clubbing SKIN: warm, nondiaphoretic, normal turgor, no ulcers NEUROPSYCH: alert, oriented to person, place, and time, normal mood, appropriate affect, no pronator drift, unable to do tandem walk    Recent Labs: 06/11/2014: TSH 0.91 03/24/2015: ALT 14; BUN 19; Creatinine, Ser 0.77; Hemoglobin 11.7*; Platelets 208.0; Potassium 4.5; Sodium 139   Lipid Panel    Component Value Date/Time   CHOL 141 03/24/2015 0919   TRIG 68.0 03/24/2015 0919   HDL 50.40 03/24/2015 0919   CHOLHDL 3 03/24/2015 0919   VLDL 13.6 03/24/2015 0919   LDLCALC 77 03/24/2015 0919     Other studies Reviewed:  EKG:  EKG Is ordered today. The ekg from 03/29/2017as personally reviewed by me and it revealed sinus rhythm 70 BPM.   Additional studies/ records that were reviewed personally reviewed by me today include: Echo 05/18/2015: Left ventricle:  The cavity size was normal. Wall thickness was  normal. Systolic function was normal. The estimated ejection  fraction was in the range of 55% to 60%. Wall motion was normal;  there were no regional wall motion abnormalities. Doppler  parameters are consistent with abnormal left ventricular  relaxation (grade 1 diastolic dysfunction). - Aortic valve: There was trivial regurgitation.  Stress test 04/09/2015:  There was no ST segment deviation noted during stress.  No T wave inversion was noted during stress.  The study is normal.  This is a low risk study.  The left ventricular ejection fraction is normal (55-65%).   Carotid bilateral 05/18/2015: Heterogeneous plaque, bilaterally. 40-59% ICA stenosis, bilaterally. > 50% ECA stenosis, bilaterally. Normal subclavian arteries, bilaterally. Patent vertebral arteries with antegrade flow. f/u 1 year  ASSESSMENT AND PLAN:  Dizziness which is vertigo-like with patient's description, associated with unsteady gait   agree with neurology evaluation Cardiac evaluation requested by PCP.  She has some risk factors for coronary artery disease including age, hypertension, diabetes. No indication for Holter monitor at this time as patient denies any palpitations. EKG overall unremarkable. Echocardiogram revealed  normal function. Stress is revealed no ischemia. This pretends a low likelihood of clinically significant CAD. Patient reassured. Risk factor modification recommended.  Right carotid bruit  Carotid artery disease with mild ICA stenosis bilaterally. Otherwise asymptomatic from this standpoint Recommend aspirin 81 mg by mouth daily. Recommended LDL goal of less than 70. Statin medication recommended. With her diagnosis of diabetes, LDL goal is otherwise 70 as well. Patient would like to discuss this with her PCP. Recommend follow-up study in one year.  Hypertension BP is well controlled. Continue monitoring BP. Continue current medical therapy and lifestyle changes.  Obesity Body mass index is 30.92 kg/(m^2).Marland Kitchen Recommend aggressive weight loss through diet and increased physical activity.     Current medicines are reviewed at length with the patient today.  The patient does not have concerns regarding medicines.  Labs/ tests ordered today include:  No orders of the defined types were placed in this encounter.    I had a lengthy and detailed discussion with the patient regarding diagnoses, prognosis, diagnostic options, treatment options.  I counseled the patient on importance of lifestyle modification including heart healthy diet, regular physical activity.   Disposition:   FU with undersigned in one year  Signed, Wende Bushy, MD  06/05/2015 9:49 AM    [place]

## 2015-06-03 NOTE — Patient Instructions (Signed)
Medication Instructions:  Your physician recommends that you continue on your current medications as directed. Please refer to the Current Medication list given to you today.   Labwork: None ordered  Testing/Procedures: Your physician has requested that you have a carotid duplex. This test is an ultrasound of the carotid arteries in your neck. It looks at blood flow through these arteries that supply the brain with blood. Allow one hour for this exam. There are no restrictions or special instructions.  This will be done in 1 year.   Follow-Up: Your physician wants you to follow-up in: 1 year with Dr. Yvone Neu. You will receive a reminder letter in the mail two months in advance. If you don't receive a letter, please call our office to schedule the follow-up appointment.   Any Other Special Instructions Will Be Listed Below (If Applicable).     If you need a refill on your cardiac medications before your next appointment, please call your pharmacy.

## 2015-06-04 DIAGNOSIS — N200 Calculus of kidney: Secondary | ICD-10-CM | POA: Diagnosis not present

## 2015-06-04 DIAGNOSIS — N2889 Other specified disorders of kidney and ureter: Secondary | ICD-10-CM | POA: Diagnosis not present

## 2015-06-04 DIAGNOSIS — R3914 Feeling of incomplete bladder emptying: Secondary | ICD-10-CM | POA: Diagnosis not present

## 2015-06-04 DIAGNOSIS — R3129 Other microscopic hematuria: Secondary | ICD-10-CM | POA: Diagnosis not present

## 2015-06-04 DIAGNOSIS — N3946 Mixed incontinence: Secondary | ICD-10-CM | POA: Diagnosis not present

## 2015-06-04 DIAGNOSIS — N3020 Other chronic cystitis without hematuria: Secondary | ICD-10-CM | POA: Diagnosis not present

## 2015-06-04 DIAGNOSIS — R339 Retention of urine, unspecified: Secondary | ICD-10-CM | POA: Diagnosis not present

## 2015-06-08 DIAGNOSIS — J301 Allergic rhinitis due to pollen: Secondary | ICD-10-CM | POA: Diagnosis not present

## 2015-06-16 ENCOUNTER — Other Ambulatory Visit: Payer: Self-pay | Admitting: Internal Medicine

## 2015-06-16 DIAGNOSIS — J301 Allergic rhinitis due to pollen: Secondary | ICD-10-CM | POA: Diagnosis not present

## 2015-06-18 DIAGNOSIS — E11649 Type 2 diabetes mellitus with hypoglycemia without coma: Secondary | ICD-10-CM | POA: Diagnosis not present

## 2015-06-18 DIAGNOSIS — Z794 Long term (current) use of insulin: Secondary | ICD-10-CM | POA: Diagnosis not present

## 2015-06-18 DIAGNOSIS — E039 Hypothyroidism, unspecified: Secondary | ICD-10-CM | POA: Diagnosis not present

## 2015-06-28 ENCOUNTER — Encounter: Payer: Self-pay | Admitting: Internal Medicine

## 2015-06-28 DIAGNOSIS — N200 Calculus of kidney: Secondary | ICD-10-CM | POA: Insufficient documentation

## 2015-06-29 DIAGNOSIS — J301 Allergic rhinitis due to pollen: Secondary | ICD-10-CM | POA: Diagnosis not present

## 2015-07-03 ENCOUNTER — Telehealth: Payer: Self-pay | Admitting: Internal Medicine

## 2015-07-03 DIAGNOSIS — Z760 Encounter for issue of repeat prescription: Secondary | ICD-10-CM

## 2015-07-03 NOTE — Telephone Encounter (Signed)
[facility] regarding medication glucose blood test strip. Pt wants a 30 day supply instead of getting 100 for 18 days. Pt wants 200 for 30 days.   [id number] - [place], [address]

## 2015-07-06 MED ORDER — GLUCOSE BLOOD VI STRP: ORAL_STRIP | Status: DC

## 2015-07-06 NOTE — Telephone Encounter (Signed)
Refill sent.

## 2015-07-16 DIAGNOSIS — J301 Allergic rhinitis due to pollen: Secondary | ICD-10-CM | POA: Diagnosis not present

## 2015-07-19 ENCOUNTER — Other Ambulatory Visit: Payer: Self-pay | Admitting: Internal Medicine

## 2015-07-20 DIAGNOSIS — J301 Allergic rhinitis due to pollen: Secondary | ICD-10-CM | POA: Diagnosis not present

## 2015-07-23 DIAGNOSIS — M4806 Spinal stenosis, lumbar region: Secondary | ICD-10-CM | POA: Diagnosis not present

## 2015-07-23 DIAGNOSIS — M25552 Pain in left hip: Secondary | ICD-10-CM | POA: Diagnosis not present

## 2015-07-23 DIAGNOSIS — M25551 Pain in right hip: Secondary | ICD-10-CM | POA: Diagnosis not present

## 2015-07-27 DIAGNOSIS — J301 Allergic rhinitis due to pollen: Secondary | ICD-10-CM | POA: Diagnosis not present

## 2015-07-30 ENCOUNTER — Other Ambulatory Visit: Payer: Self-pay | Admitting: Neurological Surgery

## 2015-07-30 DIAGNOSIS — M48061 Spinal stenosis, lumbar region without neurogenic claudication: Secondary | ICD-10-CM

## 2015-08-03 DIAGNOSIS — J301 Allergic rhinitis due to pollen: Secondary | ICD-10-CM | POA: Diagnosis not present

## 2015-08-10 DIAGNOSIS — J301 Allergic rhinitis due to pollen: Secondary | ICD-10-CM | POA: Diagnosis not present

## 2015-08-12 ENCOUNTER — Ambulatory Visit
Admission: RE | Admit: 2015-08-12 | Discharge: 2015-08-12 | Disposition: A | Discharge status: Home / Self Care | Payer: PPO | Source: Ambulatory Visit | Attending: Neurological Surgery | Admitting: Neurological Surgery

## 2015-08-12 DIAGNOSIS — M48061 Spinal stenosis, lumbar region without neurogenic claudication: Secondary | ICD-10-CM

## 2015-08-12 DIAGNOSIS — M5136 Other intervertebral disc degeneration, lumbar region: Secondary | ICD-10-CM | POA: Diagnosis not present

## 2015-08-12 DIAGNOSIS — M4806 Spinal stenosis, lumbar region: Secondary | ICD-10-CM | POA: Diagnosis not present

## 2015-08-12 MED ORDER — GADOBENATE DIMEGLUMINE 529 MG/ML IV SOLN
16.0000 mL | Freq: Once | INTRAVENOUS | Status: AC | PRN
  Administered 2015-08-12: 16 mL via INTRAVENOUS

## 2015-08-15 ENCOUNTER — Other Ambulatory Visit: Payer: Self-pay | Admitting: Internal Medicine

## 2015-08-17 DIAGNOSIS — J301 Allergic rhinitis due to pollen: Secondary | ICD-10-CM | POA: Diagnosis not present

## 2015-08-19 DIAGNOSIS — M4806 Spinal stenosis, lumbar region: Secondary | ICD-10-CM | POA: Diagnosis not present

## 2015-08-26 ENCOUNTER — Other Ambulatory Visit: Payer: Self-pay | Admitting: Neurological Surgery

## 2015-08-27 DIAGNOSIS — J301 Allergic rhinitis due to pollen: Secondary | ICD-10-CM | POA: Diagnosis not present

## 2015-08-31 DIAGNOSIS — J301 Allergic rhinitis due to pollen: Secondary | ICD-10-CM | POA: Diagnosis not present

## 2015-09-01 DIAGNOSIS — H8110 Benign paroxysmal vertigo, unspecified ear: Secondary | ICD-10-CM | POA: Diagnosis not present

## 2015-09-04 DIAGNOSIS — H8112 Benign paroxysmal vertigo, left ear: Secondary | ICD-10-CM | POA: Diagnosis not present

## 2015-09-08 DIAGNOSIS — R42 Dizziness and giddiness: Secondary | ICD-10-CM | POA: Diagnosis not present

## 2015-09-08 DIAGNOSIS — J301 Allergic rhinitis due to pollen: Secondary | ICD-10-CM | POA: Diagnosis not present

## 2015-09-09 ENCOUNTER — Encounter (HOSPITAL_COMMUNITY): Payer: Self-pay

## 2015-09-09 ENCOUNTER — Other Ambulatory Visit (HOSPITAL_COMMUNITY): Payer: Self-pay | Admitting: *Deleted

## 2015-09-09 ENCOUNTER — Encounter (HOSPITAL_COMMUNITY)
Admission: RE | Admit: 2015-09-09 | Discharge: 2015-09-09 | Disposition: A | Discharge status: Home / Self Care | Payer: PPO | Source: Ambulatory Visit | Attending: Neurological Surgery | Admitting: Neurological Surgery

## 2015-09-09 DIAGNOSIS — Z01812 Encounter for preprocedural laboratory examination: Secondary | ICD-10-CM | POA: Diagnosis not present

## 2015-09-09 HISTORY — DX: Unspecified osteoarthritis, unspecified site: M19.90

## 2015-09-09 LAB — CBC
HCT: 34 % — ABNORMAL LOW (ref 36.0–46.0)
Hemoglobin: 11 g/dL — ABNORMAL LOW (ref 12.0–15.0)
MCH: 29.5 pg (ref 26.0–34.0)
MCHC: 32.4 g/dL (ref 30.0–36.0)
MCV: 91.2 fL (ref 78.0–100.0)
Platelets: 233 10*3/uL (ref 150–400)
RBC: 3.73 MIL/uL — ABNORMAL LOW (ref 3.87–5.11)
RDW: 13.9 % (ref 11.5–15.5)
WBC: 6.6 10*3/uL (ref 4.0–10.5)

## 2015-09-09 LAB — BASIC METABOLIC PANEL
Anion gap: 7 (ref 5–15)
BUN: 22 mg/dL — ABNORMAL HIGH (ref 6–20)
CO2: 25 mmol/L (ref 22–32)
Calcium: 9.2 mg/dL (ref 8.9–10.3)
Chloride: 106 mmol/L (ref 101–111)
Creatinine, Ser: 0.83 mg/dL (ref 0.44–1.00)
GFR calc Af Amer: >60 mL/min (ref >60)
GFR calc non Af Amer: >60 mL/min (ref >60)
Glucose, Bld: 197 mg/dL — ABNORMAL HIGH (ref 65–99)
Potassium: 3.6 mmol/L (ref 3.5–5.1)
Sodium: 138 mmol/L (ref 135–145)

## 2015-09-09 LAB — GLUCOSE, CAPILLARY: Glucose-Capillary: 217 mg/dL — ABNORMAL HIGH (ref 65–99)

## 2015-09-09 LAB — SURGICAL PCR SCREEN
MRSA, PCR: NEGATIVE
Staphylococcus aureus: POSITIVE — AB

## 2015-09-09 NOTE — Pre-Procedure Instructions (Signed)
YURIRIA WEISEL  09/09/2015     Your procedure is scheduled on Tuesday, September 15, 2015 at 7:30 AM.   Report to [facility] Entrance "A" Admitting Office at 5:30 AM.   Call this number if you have problems the morning of surgery: [phone]   Questions prior to day of surgery, please call [phone] between 8 & 4 PM.   Remember:  Do not eat food or drink liquids after midnight Monday, 09/14/15.  Take these medicines the morning of surgery with A SIP OF WATER: Amlodipine (Norvasc), Fexofenadine (Allegra), Pantoprazole (Protonix), Synthroid, Flonase nasal spray, Tramadol - if needed, Lorazepam (Ativan) - if needed, Albuterol (Proventil) inhaler - if needed (Bring inhaler with you day of surgery).  Stop Aspirin and Multivitamins as of today. Do not use NSAIDS (Ibuprofen, Aleve, etc.) prior to surgery as of today.   How to Manage Your Diabetes Before Surgery   Why is it important to control my blood sugar before and after surgery?   Improving blood sugar levels before and after surgery helps healing and can limit problems.  A way of improving blood sugar control is eating a healthy diet by:  - Eating less sugar and carbohydrates  - Increasing activity/exercise  - Talk with your doctor about reaching your blood sugar goals  High blood sugars (greater than 180 mg/dL) can raise your risk of infections and slow down your recovery so you will need to focus on controlling your diabetes during the weeks before surgery.  Make sure that the doctor who takes care of your diabetes knows about your planned surgery including the date and location.  How do I manage my blood sugars before surgery?   Check your blood sugar at least 4 times a day, 2 days before surgery to make sure that they are not too high or low.  Check your blood sugar the morning of your surgery when you wake up and every 2 hours until you get to the Short-Stay unit.  Treat a low blood sugar (less than 70  mg/dL) with 1/2 cup of clear juice (cranberry or apple), 4 glucose tablets, OR glucose gel.  Recheck blood sugar in 15 minutes after treatment (to make sure it is greater than 70 mg/dL).  If blood sugar is not greater than 70 mg/dL on re-check, call [phone] for further instructions.   Report your blood sugar to the Short-Stay nurse when you get to Short-Stay.  References:  [facility], 2007 "How to Manage your Diabetes Before and After Surgery".  What do I do about my diabetes medications?   Do not take oral diabetes medicines (pills) the morning of surgery.   THE MORNING OF SURGERY, take 12 units of Lantus Insulin.  If blood sugar is >220 morning of surgery, take 1/2 of sliding scale dose of Humalog (Lispro) Insulin.   Do not wear jewelry, make-up or nail polish.  Do not wear lotions, powders, or perfumes.  Do not shave 48 hours prior to surgery.    Do not bring valuables to the hospital.  [facility] is not responsible for any belongings or valuables.  Contacts, dentures or bridgework may not be worn into surgery.  Leave your suitcase in the car.  After surgery it may be brought to your room.  For patients admitted to the hospital, discharge time will be determined by your treatment team.  Special instructions:  [facility] - Preparing for Surgery  Before surgery, you can play an important  role.  Because skin is not sterile, your skin needs to be as free of germs as possible.  You can reduce the number of germs on you skin by washing with CHG (chlorahexidine gluconate) soap before surgery.  CHG is an antiseptic cleaner which kills germs and bonds with the skin to continue killing germs even after washing.  Please DO NOT use if you have an allergy to CHG or antibacterial soaps.  If your skin becomes reddened/irritated stop using the CHG and inform your nurse when you arrive at Short Stay.  Do not shave (including legs and underarms) for at least 48  hours prior to the first CHG shower.  You may shave your face.  Please follow these instructions carefully:   1.  Shower with CHG Soap the night before surgery and the                    morning of Surgery.  2.  If you choose to wash your hair, wash your hair first as usual with your       normal shampoo.  3.  After you shampoo, rinse your hair and body thoroughly to remove the shampoo.  4.  Use CHG as you would any other liquid soap.  You can apply chg directly       to the skin and wash gently with scrungie or a clean washcloth.  5.  Apply the CHG Soap to your body ONLY FROM THE NECK DOWN.        Do not use on open wounds or open sores.  Avoid contact with your eyes, ears, mouth and genitals (private parts).  Wash genitals (private parts) with your normal soap.  6.  Wash thoroughly, paying special attention to the area where your surgery        will be performed.  7.  Thoroughly rinse your body with warm water from the neck down.  8.  DO NOT shower/wash with your normal soap after using and rinsing off       the CHG Soap.  9.  Pat yourself dry with a clean towel.            10.  Wear clean pajamas.            11.  Place clean sheets on your bed the night of your first shower and do not        sleep with pets.  Day of Surgery  Do not apply any lotions the morning of surgery.  Please wear clean clothes to the hospital.   Please read over the following fact sheets that you were given. Pain Booklet, Coughing and Deep Breathing, MRSA Information and Surgical Site Infection Prevention

## 2015-09-09 NOTE — Progress Notes (Addendum)
PCP is Dr. Einar Pheasant   LOV was 05/2015 Endocrinologist is Dr. Eddie Dibbles @ [facility] .   Morning blood sugars run 120-150's.  Last A1C was 6.4 in June 2017. Patient states that prior to April, experienced some dizziness x 3-4 weeks, PCP sent her to Dr. Wende Bushy, [place] with [facility].  They did nuclear stress, echo, EKG. Negative results and to F/U in one year.   Husband states it was 'vertigo'.  Pt. Denies any heart pains or issues.

## 2015-09-09 NOTE — Progress Notes (Signed)
[facility] for positive [facility]. Pt notified and voiced understanding.

## 2015-09-10 LAB — HEMOGLOBIN A1C
Hgb A1c MFr Bld: 6.1 % — ABNORMAL HIGH (ref 4.8–5.6)
Mean Plasma Glucose: 128 mg/dL

## 2015-09-11 ENCOUNTER — Telehealth: Payer: Self-pay | Admitting: Internal Medicine

## 2015-09-11 ENCOUNTER — Other Ambulatory Visit: Payer: Self-pay | Admitting: Internal Medicine

## 2015-09-11 MED ORDER — TRAMADOL HCL 50 MG PO TABS: 50.0000 mg | ORAL_TABLET | Freq: Four times a day (QID) | ORAL | 0 refills | Status: DC | PRN

## 2015-09-11 NOTE — Telephone Encounter (Signed)
Need more info.  Who has been prescribing and why will they not prescribe.  She was Dr Thomes Dinning pt.

## 2015-09-11 NOTE — Telephone Encounter (Signed)
Pt is having surgery on Tuesday and ran out of her traMADol (ULTRAM) 50 MG tablet. She would like to have Dr. Nicki Reaper fill for her. The doctor that prescribed  This medication will not refill for her.

## 2015-09-11 NOTE — Telephone Encounter (Signed)
Patients husband stated that the other provider would not fill it because he stated he can no longwer help her. She has surgery on Tuesday for PX.  Patient would like  4-5 days worth to make it till surgery. Please advise.

## 2015-09-11 NOTE — Telephone Encounter (Signed)
I am not able to prescribe the pain medication without ever seeing her as a pt.  If she needs something for pain over the weekend will need to be seen.

## 2015-09-11 NOTE — Telephone Encounter (Signed)
Please advise 

## 2015-09-11 NOTE — Telephone Encounter (Signed)
rx ok'd for tramadol - #20 with no refills.

## 2015-09-14 DIAGNOSIS — J301 Allergic rhinitis due to pollen: Secondary | ICD-10-CM | POA: Diagnosis not present

## 2015-09-14 MED ORDER — VANCOMYCIN HCL IN DEXTROSE 1-5 GM/200ML-% IV SOLN
1000.0000 mg | INTRAVENOUS | Status: AC
  Administered 2015-09-15: 1000 mg via INTRAVENOUS
  Filled 2015-09-14: qty 200

## 2015-09-14 NOTE — Anesthesia Preprocedure Evaluation (Addendum)
Anesthesia Evaluation  Patient identified by MRN, date of birth, ID band Patient awake    Reviewed: Allergy & Precautions, NPO status , Patient's Chart, lab work & pertinent test results  Airway Mallampati: III  TM Distance: >3 FB Neck ROM: Full    Dental no notable dental hx. (+) Teeth Intact, Dental Advisory Given   Pulmonary neg pulmonary ROS,    Pulmonary exam normal breath sounds clear to auscultation       Cardiovascular hypertension, Pt. on medications Normal cardiovascular exam Rhythm:Regular Rate:Normal  Echo 05/2015 - Left ventricle: The cavity size was normal. Wall thickness was normal. Systolic function was normal. The estimated ejection fraction was in the range of 55% to 60%. Wall motion was normal; there were no regional wall motion abnormalities. Doppler parameters are consistent with abnormal left ventricular relaxation (grade 1 diastolic dysfunction). - Aortic valve: There was trivial regurgitation.   Neuro/Psych negative neurological ROS  negative psych ROS   GI/Hepatic Neg liver ROS, PUD, GERD  Medicated and Controlled,  Endo/Other  diabetes, Type 2Hypothyroidism   Renal/GU Renal disease  negative genitourinary   Musculoskeletal negative musculoskeletal ROS (+)   Abdominal (+) + obese,   Peds  Hematology negative hematology ROS (+)   Anesthesia Other Findings   Reproductive/Obstetrics negative OB ROS                           Anesthesia Physical Anesthesia Plan  ASA: III  Anesthesia Plan: General   Post-op Pain Management:    Induction: Intravenous  Airway Management Planned: Oral ETT  Additional Equipment:   Intra-op Plan:   Post-operative Plan: Extubation in OR  Informed Consent: I have reviewed the patients History and Physical, chart, labs and discussed the procedure including the risks, benefits and alternatives for the proposed anesthesia with the patient  or authorized representative who has indicated his/her understanding and acceptance.   Dental advisory given  Plan Discussed with: CRNA  Anesthesia Plan Comments:         Anesthesia Quick Evaluation

## 2015-09-15 ENCOUNTER — Ambulatory Visit (HOSPITAL_COMMUNITY): Payer: PPO | Admitting: Anesthesiology

## 2015-09-15 ENCOUNTER — Inpatient Hospital Stay (HOSPITAL_COMMUNITY): Payer: PPO

## 2015-09-15 ENCOUNTER — Inpatient Hospital Stay (HOSPITAL_COMMUNITY)
Admission: AD | Admit: 2015-09-15 | Discharge: 2015-09-17 | DRG: 519 | Disposition: A | Discharge status: Home / Self Care | Payer: PPO | Source: Ambulatory Visit | Attending: Neurological Surgery | Admitting: Neurological Surgery

## 2015-09-15 ENCOUNTER — Encounter (HOSPITAL_COMMUNITY)
Admission: AD | Disposition: A | Discharge status: Home / Self Care | Payer: Self-pay | Source: Ambulatory Visit | Attending: Neurological Surgery

## 2015-09-15 ENCOUNTER — Encounter (HOSPITAL_COMMUNITY): Payer: Self-pay | Admitting: Surgery

## 2015-09-15 DIAGNOSIS — I10 Essential (primary) hypertension: Secondary | ICD-10-CM | POA: Diagnosis not present

## 2015-09-15 DIAGNOSIS — E119 Type 2 diabetes mellitus without complications: Secondary | ICD-10-CM | POA: Diagnosis present

## 2015-09-15 DIAGNOSIS — Z981 Arthrodesis status: Secondary | ICD-10-CM

## 2015-09-15 DIAGNOSIS — G9611 Dural tear: Secondary | ICD-10-CM | POA: Diagnosis present

## 2015-09-15 DIAGNOSIS — M4806 Spinal stenosis, lumbar region: Secondary | ICD-10-CM | POA: Diagnosis not present

## 2015-09-15 DIAGNOSIS — K219 Gastro-esophageal reflux disease without esophagitis: Secondary | ICD-10-CM | POA: Diagnosis present

## 2015-09-15 DIAGNOSIS — Z794 Long term (current) use of insulin: Secondary | ICD-10-CM | POA: Diagnosis not present

## 2015-09-15 DIAGNOSIS — Z419 Encounter for procedure for purposes other than remedying health state, unspecified: Secondary | ICD-10-CM

## 2015-09-15 DIAGNOSIS — G8929 Other chronic pain: Secondary | ICD-10-CM | POA: Diagnosis present

## 2015-09-15 DIAGNOSIS — M1990 Unspecified osteoarthritis, unspecified site: Secondary | ICD-10-CM | POA: Diagnosis not present

## 2015-09-15 DIAGNOSIS — E039 Hypothyroidism, unspecified: Secondary | ICD-10-CM | POA: Diagnosis not present

## 2015-09-15 DIAGNOSIS — M48062 Spinal stenosis, lumbar region with neurogenic claudication: Secondary | ICD-10-CM | POA: Diagnosis present

## 2015-09-15 DIAGNOSIS — M5416 Radiculopathy, lumbar region: Secondary | ICD-10-CM | POA: Diagnosis present

## 2015-09-15 HISTORY — PX: LUMBAR LAMINECTOMY WITH COFLEX 1 LEVEL: SHX6514

## 2015-09-15 LAB — GLUCOSE, CAPILLARY
Glucose-Capillary: 136 mg/dL — ABNORMAL HIGH (ref 65–99)
Glucose-Capillary: 180 mg/dL — ABNORMAL HIGH (ref 65–99)
Glucose-Capillary: 172 mg/dL — ABNORMAL HIGH (ref 65–99)
Glucose-Capillary: 341 mg/dL — ABNORMAL HIGH (ref 65–99)
Glucose-Capillary: 235 mg/dL — ABNORMAL HIGH (ref 65–99)

## 2015-09-15 SURGERY — LUMBAR LAMINECTOMY WITH COFLEX 1 LEVEL
Anesthesia: General | Site: Back

## 2015-09-15 MED ORDER — METHOCARBAMOL 500 MG PO TABS
500.0000 mg | ORAL_TABLET | Freq: Four times a day (QID) | ORAL | Status: DC | PRN
  Administered 2015-09-16 – 2015-09-17 (×2): 500 mg via ORAL
  Filled 2015-09-15 (×2): qty 1

## 2015-09-15 MED ORDER — PHENOL 1.4 % MT LIQD: 1.0000 | OROMUCOSAL | Status: DC | PRN

## 2015-09-15 MED ORDER — VANCOMYCIN HCL 1000 MG IV SOLR
INTRAVENOUS | Status: AC
  Filled 2015-09-15: qty 1000

## 2015-09-15 MED ORDER — FENTANYL CITRATE (PF) 100 MCG/2ML IJ SOLN
INTRAMUSCULAR | Status: AC
  Filled 2015-09-15: qty 4

## 2015-09-15 MED ORDER — INSULIN ASPART 100 UNIT/ML ~~LOC~~ SOLN
0.0000 [IU] | Freq: Three times a day (TID) | SUBCUTANEOUS | Status: DC
  Administered 2015-09-15: 7 [IU] via SUBCUTANEOUS
  Administered 2015-09-15 – 2015-09-16 (×2): 2 [IU] via SUBCUTANEOUS
  Administered 2015-09-16: 1 [IU] via SUBCUTANEOUS
  Administered 2015-09-16: 3 [IU] via SUBCUTANEOUS

## 2015-09-15 MED ORDER — DOCUSATE SODIUM 100 MG PO CAPS
100.0000 mg | ORAL_CAPSULE | Freq: Two times a day (BID) | ORAL | Status: DC
  Administered 2015-09-15 – 2015-09-17 (×5): 100 mg via ORAL
  Filled 2015-09-15 (×5): qty 1

## 2015-09-15 MED ORDER — ALBUTEROL SULFATE (2.5 MG/3ML) 0.083% IN NEBU
2.5000 mg | INHALATION_SOLUTION | Freq: Four times a day (QID) | RESPIRATORY_TRACT | Status: DC | PRN
Start: 2015-09-15 — End: 2015-09-17

## 2015-09-15 MED ORDER — ACETAMINOPHEN 10 MG/ML IV SOLN
INTRAVENOUS | Status: DC | PRN
  Administered 2015-09-15: 1000 mg via INTRAVENOUS

## 2015-09-15 MED ORDER — SODIUM CHLORIDE 0.9 % IV SOLN: 250.0000 mL | INTRAVENOUS | Status: DC

## 2015-09-15 MED ORDER — METHOCARBAMOL 1000 MG/10ML IJ SOLN
500.0000 mg | Freq: Four times a day (QID) | INTRAVENOUS | Status: DC | PRN
  Filled 2015-09-15: qty 5

## 2015-09-15 MED ORDER — LORATADINE 10 MG PO TABS
10.0000 mg | ORAL_TABLET | Freq: Every day | ORAL | Status: DC
  Administered 2015-09-16: 10 mg via ORAL
  Filled 2015-09-15 (×2): qty 1

## 2015-09-15 MED ORDER — HYDROCODONE-ACETAMINOPHEN 5-325 MG PO TABS
1.0000 | ORAL_TABLET | ORAL | Status: DC | PRN
  Administered 2015-09-15 – 2015-09-17 (×8): 2 via ORAL
  Filled 2015-09-15 (×8): qty 2

## 2015-09-15 MED ORDER — HYDROMORPHONE HCL 1 MG/ML IJ SOLN: 0.2500 mg | INTRAMUSCULAR | Status: DC | PRN

## 2015-09-15 MED ORDER — SODIUM CHLORIDE 0.9 % IR SOLN
Status: DC | PRN
  Administered 2015-09-15: 09:00:00

## 2015-09-15 MED ORDER — DEXTROSE 5 % IV SOLN
INTRAVENOUS | Status: DC | PRN
  Administered 2015-09-15: 15 ug/min via INTRAVENOUS

## 2015-09-15 MED ORDER — LACTATED RINGERS IV SOLN
INTRAVENOUS | Status: DC | PRN
  Administered 2015-09-15 (×2): via INTRAVENOUS

## 2015-09-15 MED ORDER — ONDANSETRON HCL 4 MG/2ML IJ SOLN
INTRAMUSCULAR | Status: DC | PRN
  Administered 2015-09-15: 4 mg via INTRAVENOUS

## 2015-09-15 MED ORDER — VECURONIUM BROMIDE 10 MG IV SOLR
INTRAVENOUS | Status: AC
  Filled 2015-09-15: qty 10

## 2015-09-15 MED ORDER — INSULIN LISPRO 100 UNIT/ML (KWIKPEN): 6.0000 [IU] | PEN_INJECTOR | Freq: Three times a day (TID) | SUBCUTANEOUS | Status: DC

## 2015-09-15 MED ORDER — SENNA 8.6 MG PO TABS
1.0000 | ORAL_TABLET | Freq: Two times a day (BID) | ORAL | Status: DC
  Administered 2015-09-15 – 2015-09-16 (×4): 8.6 mg via ORAL
  Filled 2015-09-15 (×4): qty 1

## 2015-09-15 MED ORDER — AMLODIPINE BESYLATE 5 MG PO TABS
5.0000 mg | ORAL_TABLET | Freq: Every day | ORAL | Status: DC
  Administered 2015-09-16: 5 mg via ORAL
  Filled 2015-09-15 (×2): qty 1

## 2015-09-15 MED ORDER — PROPOFOL 10 MG/ML IV BOLUS
INTRAVENOUS | Status: DC | PRN
Start: 2015-09-15 — End: 2015-09-15
  Administered 2015-09-15: 20 mg via INTRAVENOUS
  Administered 2015-09-15: 150 mg via INTRAVENOUS

## 2015-09-15 MED ORDER — HYDROCHLOROTHIAZIDE 12.5 MG PO CAPS
12.5000 mg | ORAL_CAPSULE | Freq: Every day | ORAL | Status: DC
  Administered 2015-09-15 – 2015-09-16 (×2): 12.5 mg via ORAL
  Filled 2015-09-15 (×2): qty 1

## 2015-09-15 MED ORDER — FENTANYL CITRATE (PF) 100 MCG/2ML IJ SOLN
INTRAMUSCULAR | Status: AC
  Filled 2015-09-15: qty 2

## 2015-09-15 MED ORDER — ALUM & MAG HYDROXIDE-SIMETH 200-200-20 MG/5ML PO SUSP: 30.0000 mL | Freq: Four times a day (QID) | ORAL | Status: DC | PRN

## 2015-09-15 MED ORDER — MEPERIDINE HCL 25 MG/ML IJ SOLN: 6.2500 mg | INTRAMUSCULAR | Status: DC | PRN

## 2015-09-15 MED ORDER — BUPIVACAINE-EPINEPHRINE (PF) 0.5% -1:200000 IJ SOLN
INTRAMUSCULAR | Status: DC | PRN
  Administered 2015-09-15 (×2): 10 mL

## 2015-09-15 MED ORDER — ALBUTEROL SULFATE HFA 108 (90 BASE) MCG/ACT IN AERS
2.0000 | INHALATION_SPRAY | Freq: Four times a day (QID) | RESPIRATORY_TRACT | Status: DC | PRN
Start: 2015-09-15 — End: 2015-09-15

## 2015-09-15 MED ORDER — IRBESARTAN 150 MG PO TABS
150.0000 mg | ORAL_TABLET | Freq: Every day | ORAL | Status: DC
  Administered 2015-09-15 – 2015-09-16 (×2): 150 mg via ORAL
  Filled 2015-09-15 (×3): qty 1

## 2015-09-15 MED ORDER — PROPOFOL 10 MG/ML IV BOLUS
INTRAVENOUS | Status: AC
  Filled 2015-09-15: qty 20

## 2015-09-15 MED ORDER — METFORMIN HCL ER 500 MG PO TB24
500.0000 mg | ORAL_TABLET | Freq: Two times a day (BID) | ORAL | Status: DC
  Administered 2015-09-15 – 2015-09-16 (×3): 500 mg via ORAL
  Filled 2015-09-15 (×3): qty 1

## 2015-09-15 MED ORDER — LIDOCAINE HCL (CARDIAC) 20 MG/ML IV SOLN
INTRAVENOUS | Status: DC | PRN
  Administered 2015-09-15: 100 mg via INTRAVENOUS

## 2015-09-15 MED ORDER — SODIUM CHLORIDE 0.9% FLUSH
3.0000 mL | Freq: Two times a day (BID) | INTRAVENOUS | Status: DC
  Administered 2015-09-15: 3 mL via INTRAVENOUS

## 2015-09-15 MED ORDER — POLYETHYLENE GLYCOL 3350 17 G PO PACK: 17.0000 g | PACK | Freq: Every day | ORAL | Status: DC | PRN

## 2015-09-15 MED ORDER — 0.9 % SODIUM CHLORIDE (POUR BTL) OPTIME
TOPICAL | Status: DC | PRN
  Administered 2015-09-15: 1000 mL

## 2015-09-15 MED ORDER — ONDANSETRON HCL 4 MG/2ML IJ SOLN: 4.0000 mg | INTRAMUSCULAR | Status: DC | PRN

## 2015-09-15 MED ORDER — VALSARTAN-HYDROCHLOROTHIAZIDE 160-12.5 MG PO TABS: 1.0000 | ORAL_TABLET | Freq: Every day | ORAL | Status: DC

## 2015-09-15 MED ORDER — LEVOTHYROXINE SODIUM 175 MCG PO TABS
175.0000 ug | ORAL_TABLET | Freq: Every day | ORAL | Status: DC
  Administered 2015-09-16 – 2015-09-17 (×2): 175 ug via ORAL
  Filled 2015-09-15 (×2): qty 1

## 2015-09-15 MED ORDER — FENTANYL CITRATE (PF) 100 MCG/2ML IJ SOLN
INTRAMUSCULAR | Status: DC | PRN
  Administered 2015-09-15 (×4): 50 ug via INTRAVENOUS

## 2015-09-15 MED ORDER — HEMOSTATIC AGENTS (NO CHARGE) OPTIME
TOPICAL | Status: DC | PRN
  Administered 2015-09-15: 1 via TOPICAL

## 2015-09-15 MED ORDER — INSULIN GLARGINE 100 UNIT/ML ~~LOC~~ SOLN
24.0000 [IU] | Freq: Every day | SUBCUTANEOUS | Status: DC
  Administered 2015-09-15 – 2015-09-16 (×2): 24 [IU] via SUBCUTANEOUS
  Filled 2015-09-15 (×3): qty 0.24

## 2015-09-15 MED ORDER — SUGAMMADEX SODIUM 200 MG/2ML IV SOLN
INTRAVENOUS | Status: DC | PRN
  Administered 2015-09-15: 175 mg via INTRAVENOUS

## 2015-09-15 MED ORDER — FLEET ENEMA 7-19 GM/118ML RE ENEM: 1.0000 | ENEMA | Freq: Once | RECTAL | Status: DC | PRN

## 2015-09-15 MED ORDER — MIDAZOLAM HCL 2 MG/2ML IJ SOLN
INTRAMUSCULAR | Status: AC
  Filled 2015-09-15: qty 2

## 2015-09-15 MED ORDER — THROMBIN 5000 UNITS EX SOLR
CUTANEOUS | Status: DC | PRN
  Administered 2015-09-15 (×2): 5000 [IU] via TOPICAL

## 2015-09-15 MED ORDER — CHLORHEXIDINE GLUCONATE CLOTH 2 % EX PADS: 6.0000 | MEDICATED_PAD | Freq: Once | CUTANEOUS | Status: DC

## 2015-09-15 MED ORDER — BISACODYL 10 MG RE SUPP: 10.0000 mg | Freq: Every day | RECTAL | Status: DC | PRN

## 2015-09-15 MED ORDER — LORAZEPAM 0.5 MG PO TABS: 0.2500 mg | ORAL_TABLET | Freq: Every day | ORAL | Status: DC | PRN

## 2015-09-15 MED ORDER — MENTHOL 3 MG MT LOZG: 1.0000 | LOZENGE | OROMUCOSAL | Status: DC | PRN

## 2015-09-15 MED ORDER — ACETAMINOPHEN 325 MG PO TABS: 650.0000 mg | ORAL_TABLET | ORAL | Status: DC | PRN

## 2015-09-15 MED ORDER — VECURONIUM BROMIDE 10 MG IV SOLR
INTRAVENOUS | Status: DC | PRN
  Administered 2015-09-15: 5 mg via INTRAVENOUS
  Administered 2015-09-15: 2 mg via INTRAVENOUS

## 2015-09-15 MED ORDER — GLUCAGON (RDNA) 1 MG IJ KIT: 1.0000 mg | PACK | Freq: Once | INTRAMUSCULAR | Status: DC | PRN

## 2015-09-15 MED ORDER — PHENYLEPHRINE 40 MCG/ML (10ML) SYRINGE FOR IV PUSH (FOR BLOOD PRESSURE SUPPORT)
PREFILLED_SYRINGE | INTRAVENOUS | Status: AC
  Filled 2015-09-15: qty 10

## 2015-09-15 MED ORDER — PROMETHAZINE HCL 25 MG/ML IJ SOLN: 6.2500 mg | INTRAMUSCULAR | Status: DC | PRN

## 2015-09-15 MED ORDER — MORPHINE SULFATE (PF) 2 MG/ML IV SOLN: 1.0000 mg | INTRAVENOUS | Status: DC | PRN

## 2015-09-15 MED ORDER — TRAMADOL HCL 50 MG PO TABS: 50.0000 mg | ORAL_TABLET | Freq: Four times a day (QID) | ORAL | Status: DC | PRN

## 2015-09-15 MED ORDER — ACETAMINOPHEN 10 MG/ML IV SOLN
INTRAVENOUS | Status: AC
  Filled 2015-09-15: qty 100

## 2015-09-15 MED ORDER — ACETAMINOPHEN 650 MG RE SUPP: 650.0000 mg | RECTAL | Status: DC | PRN

## 2015-09-15 MED ORDER — SODIUM CHLORIDE 0.9 % IJ SOLN
INTRAMUSCULAR | Status: AC
  Filled 2015-09-15: qty 10

## 2015-09-15 MED ORDER — LIDOCAINE 2% (20 MG/ML) 5 ML SYRINGE
INTRAMUSCULAR | Status: AC
  Filled 2015-09-15: qty 5

## 2015-09-15 MED ORDER — IMIPRAMINE HCL 25 MG PO TABS
25.0000 mg | ORAL_TABLET | Freq: Every day | ORAL | Status: DC
  Administered 2015-09-15 – 2015-09-16 (×2): 25 mg via ORAL
  Filled 2015-09-15 (×2): qty 1

## 2015-09-15 MED ORDER — FLUTICASONE PROPIONATE 50 MCG/ACT NA SUSP
2.0000 | Freq: Every day | NASAL | Status: DC
  Administered 2015-09-16: 2 via NASAL
  Filled 2015-09-15: qty 16

## 2015-09-15 MED ORDER — OXYCODONE-ACETAMINOPHEN 5-325 MG PO TABS
1.0000 | ORAL_TABLET | ORAL | Status: DC | PRN
  Administered 2015-09-17: 2 via ORAL
  Filled 2015-09-15: qty 2

## 2015-09-15 MED ORDER — PANTOPRAZOLE SODIUM 40 MG PO TBEC
40.0000 mg | DELAYED_RELEASE_TABLET | Freq: Two times a day (BID) | ORAL | Status: DC
  Administered 2015-09-15 – 2015-09-17 (×4): 40 mg via ORAL
  Filled 2015-09-15 (×3): qty 1

## 2015-09-15 MED ORDER — SODIUM CHLORIDE 0.9% FLUSH: 3.0000 mL | INTRAVENOUS | Status: DC | PRN

## 2015-09-15 MED ORDER — THROMBIN 5000 UNITS EX SOLR
OROMUCOSAL | Status: DC | PRN
  Administered 2015-09-15: 09:00:00 via TOPICAL

## 2015-09-15 SURGICAL SUPPLY — 57 items
BAG DECANTER FOR FLEXI CONT (MISCELLANEOUS) ×2 IMPLANT
BLADE CLIPPER SURG (BLADE) IMPLANT
BUR ACORN 6.0 (BURR) IMPLANT
BUR MATCHSTICK NEURO 3.0 LAGG (BURR) ×2 IMPLANT
CANISTER SUCT 3000ML PPV (MISCELLANEOUS) ×2 IMPLANT
DECANTER SPIKE VIAL GLASS SM (MISCELLANEOUS) ×2 IMPLANT
DERMABOND ADVANCED (GAUZE/BANDAGES/DRESSINGS) ×1
DERMABOND ADVANCED .7 DNX12 (GAUZE/BANDAGES/DRESSINGS) ×1 IMPLANT
DEVICE COFLEX STABLIZATION 10M (Neuro Prosthesis/Implant) ×2 IMPLANT
DEVICE DISSECT PLASMABLAD 3.0S (MISCELLANEOUS) ×1 IMPLANT
DRAPE C-ARM 42X72 X-RAY (DRAPES) ×4 IMPLANT
DRAPE HALF SHEET 40X57 (DRAPES) ×2 IMPLANT
DRAPE LAPAROTOMY T 102X78X121 (DRAPES) ×2 IMPLANT
DRAPE MICROSCOPE LEICA (MISCELLANEOUS) ×2 IMPLANT
DRAPE POUCH INSTRU U-SHP 10X18 (DRAPES) ×2 IMPLANT
DURAPREP 26ML APPLICATOR (WOUND CARE) ×2 IMPLANT
ELECT REM PT RETURN 9FT ADLT (ELECTROSURGICAL) ×2
ELECTRODE REM PT RTRN 9FT ADLT (ELECTROSURGICAL) ×1 IMPLANT
GAUZE SPONGE 4X4 12PLY STRL (GAUZE/BANDAGES/DRESSINGS) IMPLANT
GAUZE SPONGE 4X4 16PLY XRAY LF (GAUZE/BANDAGES/DRESSINGS) IMPLANT
GLOVE BIOGEL PI IND STRL 7.0 (GLOVE) ×1 IMPLANT
GLOVE BIOGEL PI IND STRL 7.5 (GLOVE) ×1 IMPLANT
GLOVE BIOGEL PI IND STRL 8.5 (GLOVE) ×1 IMPLANT
GLOVE BIOGEL PI INDICATOR 7.0 (GLOVE) ×1
GLOVE BIOGEL PI INDICATOR 7.5 (GLOVE) ×1
GLOVE BIOGEL PI INDICATOR 8.5 (GLOVE) ×1
GLOVE ECLIPSE 7.0 STRL STRAW (GLOVE) ×2 IMPLANT
GLOVE ECLIPSE 8.5 STRL (GLOVE) ×2 IMPLANT
GLOVE SURG SS PI 6.5 STRL IVOR (GLOVE) ×6 IMPLANT
GOWN STRL REUS W/ TWL LRG LVL3 (GOWN DISPOSABLE) ×2 IMPLANT
GOWN STRL REUS W/ TWL XL LVL3 (GOWN DISPOSABLE) IMPLANT
GOWN STRL REUS W/TWL 2XL LVL3 (GOWN DISPOSABLE) ×2 IMPLANT
GOWN STRL REUS W/TWL LRG LVL3 (GOWN DISPOSABLE) ×2
GOWN STRL REUS W/TWL XL LVL3 (GOWN DISPOSABLE)
GRAFT DURAGEN MATRIX 1WX1L (Tissue) ×2 IMPLANT
HEMOSTAT POWDER KIT SURGIFOAM (HEMOSTASIS) ×2 IMPLANT
KIT BASIN OR (CUSTOM PROCEDURE TRAY) ×2 IMPLANT
KIT ROOM TURNOVER OR (KITS) ×2 IMPLANT
NEEDLE HYPO 22GX1.5 SAFETY (NEEDLE) ×2 IMPLANT
NEEDLE SPNL 18GX3.5 QUINCKE PK (NEEDLE) ×2 IMPLANT
NEEDLE SPNL 20GX3.5 QUINCKE YW (NEEDLE) IMPLANT
NS IRRIG 1000ML POUR BTL (IV SOLUTION) ×2 IMPLANT
PACK LAMINECTOMY NEURO (CUSTOM PROCEDURE TRAY) ×2 IMPLANT
PAD ARMBOARD 7.5X6 YLW CONV (MISCELLANEOUS) ×6 IMPLANT
PATTIES SURGICAL .5 X.5 (GAUZE/BANDAGES/DRESSINGS) ×2 IMPLANT
PATTIES SURGICAL .5 X1 (DISPOSABLE) ×2 IMPLANT
PLASMABLADE 3.0S (MISCELLANEOUS) ×2
RUBBERBAND STERILE (MISCELLANEOUS) IMPLANT
SPONGE SURGIFOAM ABS GEL SZ50 (HEMOSTASIS) ×2 IMPLANT
SUT PROLENE 6 0 BV (SUTURE) ×2 IMPLANT
SUT VIC AB 1 CT1 18XBRD ANBCTR (SUTURE) ×1 IMPLANT
SUT VIC AB 1 CT1 8-18 (SUTURE) ×1
SUT VIC AB 2-0 CP2 18 (SUTURE) ×2 IMPLANT
SUT VIC AB 3-0 SH 8-18 (SUTURE) ×2 IMPLANT
TOWEL OR 17X24 6PK STRL BLUE (TOWEL DISPOSABLE) ×2 IMPLANT
TOWEL OR 17X26 10 PK STRL BLUE (TOWEL DISPOSABLE) ×2 IMPLANT
WATER STERILE IRR 1000ML POUR (IV SOLUTION) ×2 IMPLANT

## 2015-09-15 NOTE — Anesthesia Procedure Notes (Signed)
Procedure Name: Intubation Date/Time: 09/15/2015 7:45 AM Performed by: Jenne Campus Pre-anesthesia Checklist: Patient identified, Emergency Drugs available, Suction available and Patient being monitored Patient Re-evaluated:Patient Re-evaluated prior to inductionOxygen Delivery Method: Circle System Utilized Preoxygenation: Pre-oxygenation with 100% oxygen Intubation Type: IV induction Ventilation: Mask ventilation without difficulty Laryngoscope Size: Miller and 2 Grade View: Grade I Tube type: Oral Tube size: 7.0 mm Number of attempts: 1 Airway Equipment and Method: Stylet and Oral airway Placement Confirmation: ETT inserted through vocal cords under direct vision,  positive ETCO2 and breath sounds checked- equal and bilateral Secured at: 21 cm Tube secured with: Tape Dental Injury: Teeth and Oropharynx as per pre-operative assessment

## 2015-09-15 NOTE — H&P (Signed)
CHIEF COMPLAINT:                                          Pain in the back, down the left leg, and some occasions in the right leg.  HISTORY OF PRESENT ILLNESS:                     Marissa Huff is an 82 year old individual whom I have seen and treated in the past. In 1999, I did a decompression and fusion using a Ray cage technique at L3-4, L4-5, and L5-S1. She notes that she healed well from this, and she has had good function of her back and her legs since that time. However, over the past six to eight months, she has had increasing pain in her back with radiation into her right lower extremity. To some degree, she has had some chronic right lower radiculopathy, but this has recently gotten somewhat worse. She notes the left leg tends to bother her more now, and she describes the pain as radiating from the buttock through the lateral aspect of the thigh, the back of the thigh, and down into the calf and the shin. Her motor function has remained good, and she is able to maintain a good level of activity. She finds that working in her yard oftentimes tends to make the pain worse, and she notes that her worst pain she describes as a scale of 8/10, but most of the time it is much lower grade. She has not had any significant workup of this, and today in the office we obtain an AP of the pelvis, in addition to AP and lateral with flexion and extension views of the lumbar spine. The pelvis film demonstrates that she had some mild degenerative changes in the region of the left hip. The joint is still largely preserved but slightly narrowed. The rest of the pelvis appears to be intact. In the lumbar spine, we note that she has a Ray cage fusion with maintenance of a stable lordosis at L3-4, L4-5, and L5-S1. No movement is noted between these segments in flexion and extension, and the adjacent level L2-3 has some mild retrolisthesis with minimal movement at that level. No change in the retrolisthesis is noted between  flexion and extension. The coronal alignment is good on the AP view. Impression on the basis of the x-rays is that she has some mild left-sided hip degenerative change and a solid arthrodesis L3 to the sacrum using a Ray cage technique with adjacent level disease at L2-3.  REVIEW OF SYSTEMS:                                    Her systems review is notable for hearing loss, ringing in the ears, balance disturbance, sinus problems, urinary tract infections by history, leg pain while walking, back pain, leg pain, thyroid disease, and kidney stones on a 14-point review sheet.    PAST MEDICAL HISTORY:                                  . Current Medical Conditions:  Her Past Medical History reveals that her general health has been good. She notes that she is able to maintain an active lifestyle.  Marland Kitchen  Medications and Allergies:  Her current medications include glucagon injections, imipramine, insulin, lorazepam, metformin, a multivitamin, pantoprazole, Synthroid, tramadol, valsartan, and calcium and vitamin D supplementation, in addition to fexofenadine and Flonase.  PHYSICAL EXAMINATION:                                On physical examination, I note that she stands straight and erect. She walks without antalgia. Her motor function is good in the iliopsoas, quadriceps, tibialis anterior, and the gastrocs. Deep tendon reflexes are trace in the patellae, absent in the Achilles, bilaterally. Straight leg raising is negative to 60 degrees in either lower extremity and Patrick maneuver is negative, bilaterally, also. Sensation is intact in the distal lower extremities. Her station and gait appear intact, and she does not walk with antalgia.     Marissa Huff returns to the office today having had a recent, new MRI.  I reviewed the MRI and it demonstrates that there is a significant profound stenosis at the L1-L2 level.  In [place] had a decompression and fusion using a ray cage technique at L2-3, 3-4, and 4-5.  Those  areas have healed wonderfully, and she has an amply patent canal at those levels.  At L1-2 she has a slight retrolisthesis with severe bulging of the disk that combines with facet hypertrophy to form both central and lateral recess stenosis.  She also has some mild stenosis at the L1-L2 level, but nothing that approaches surgical significance.      IMPRESSION/PLAN:                             I have advised Marissa Huff that I believe that bilateral laminotomies and foraminotomies would go a long way to decompressing this and should give her significant relief.  I do believe that she would benefit, however, from placement of a Coflex device to maintain the integrity of the decompression for longterm.            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  Marissa Huff tells me that she has been quite miserable with the pain that has been going on and she has already been seeing Dr. Sharlet Salina for a couple of years, trying to avoid the need for any surgical intervention.  Given her poor level of function and the chronic pain that she is experiencing, I believe that bilateral laminotomies and foraminotomies with placement of a Coflex would be in her best interest, and we will plan on scheduling this at the earliest convenience.

## 2015-09-15 NOTE — Evaluation (Signed)
Physical Therapy Evaluation Patient Details Name: Marissa Huff MRN: [id number] DOB: Jul 24, 1931 Today's Date: 09/15/2015   History of Present Illness  Marissa Huff is an 82 year old individual who some 20 years ago underwent the S and fusion from L2-L5. She did well for a number of years but lately has been having increasing back pain with leg pain and weakness she has severe stenosis at the level of L1-L2 on MRI.  She underwent bilateral laminotomies L1-L2 decompression of lumbar stenosis L1-L2, placement of Coflex L1-L2 (10 mm)  Clinical Impression  Patient presents with decreased mobility due to deficits listed in PT problem list.  She will benefit from skilled PT in the acute setting to allow return home with family support. No current follow up PT needs.     Follow Up Recommendations No PT follow up    Equipment Recommendations  None recommended by PT    Recommendations for Other Services       Precautions / Restrictions Precautions Precautions: Fall;Back Required Braces or Orthoses: Spinal Brace Spinal Brace: Lumbar corset;Applied in sitting position      Mobility  Bed Mobility Overal bed mobility: Needs Assistance Bed Mobility: Supine to Sit;Sit to Supine     Supine to sit: Supervision;HOB elevated Sit to supine: Min assist   General bed mobility comments: sat up with HOB elevated, cues for correct technique, practiced to supine with HOB flat and with log roll technique; assist for feet  Transfers Overall transfer level: Needs assistance   Transfers: Sit to/from Stand Sit to Stand: Supervision         General transfer comment: for safety/precautions  Ambulation/Gait Ambulation/Gait assistance: Min guard;Min assist Ambulation Distance (Feet): 175 Feet Assistive device: 1 person hand held assist Gait Pattern/deviations: Step-through pattern;Decreased stride length     General Gait Details: slow and with assist for balance  Stairs Stairs: Yes Stairs  assistance: Min assist Stair Management: One rail Left;Forwards;Sideways Number of Stairs: 10 General stair comments: assist with HHA when holding rail with one hand, then cued to use rail with both hands and sideways technique and able to perform with minguard  Wheelchair Mobility    Modified Rankin (Stroke Patients Only)       Balance Overall balance assessment: Needs assistance Sitting-balance support: Feet supported Sitting balance-Leahy Scale: Fair     Standing balance support: No upper extremity supported Standing balance-Leahy Scale: Fair Standing balance comment: stands without support no LOB, but wants to hold onto something for ambulation                             Pertinent Vitals/Pain Pain Assessment: Faces Faces Pain Scale: Hurts a little bit Pain Location: no pain sitting or standing, but with transitions Pain Descriptors / Indicators: Discomfort;Sore Pain Intervention(s): Monitored during session;Repositioned    Home Living Family/patient expects to be discharged to:: Private residence Living Arrangements: Spouse/significant other;Children Available Help at Discharge: Family;Available 24 hours/day Type of Home: House Home Access: Stairs to enter Entrance Stairs-Rails: None Entrance Stairs-Number of Steps: 3 Home Layout: Two level;Bed/bath upstairs Home Equipment: Walker - 2 wheels;Bedside commode;Shower seat - built in      Prior Function Level of Independence: Independent               Hand Dominance        Extremity/Trunk Assessment   Upper Extremity Assessment: Overall WFL for tasks assessed           Lower Extremity  Assessment: Generalized weakness         Communication   Communication: No difficulties  Cognition Arousal/Alertness: Awake/alert Behavior During Therapy: WFL for tasks assessed/performed Overall Cognitive Status: Within Functional Limits for tasks assessed                      General  Comments General comments (skin integrity, edema, etc.): educated in back precautions and reviwed handout    Exercises        Assessment/Plan    PT Assessment Patient needs continued PT services  PT Diagnosis Difficulty walking   PT Problem List Decreased balance;Pain;Decreased knowledge of use of DME;Decreased knowledge of precautions;Decreased strength  PT Treatment Interventions DME instruction;Gait training;Stair training;Functional mobility training;Therapeutic activities;Patient/family education;Therapeutic exercise;Balance training;Neuromuscular re-education   PT Goals (Current goals can be found in the Care Plan section) Acute Rehab PT Goals Patient Stated Goal: To go home PT Goal Formulation: With patient/family Time For Goal Achievement: 09/17/15 Potential to Achieve Goals: Good    Frequency Min 5X/week   Barriers to discharge        Co-evaluation               End of Session Equipment Utilized During Treatment: Gait belt Activity Tolerance: Patient tolerated treatment well Patient left: in bed;with call bell/phone within reach           Time: [id number] PT Time Calculation (min) (ACUTE ONLY): 25 min   Charges:   PT Evaluation $PT Eval Moderate Complexity: 1 Procedure PT Treatments $Gait Training: 8-22 mins   PT G CodesReginia Naas 09/21/2015, 5:04 PM  Magda Kiel, [place]err[place] 2015/09/21

## 2015-09-15 NOTE — Transfer of Care (Signed)
Immediate Anesthesia Transfer of Care Note  Patient: Marissa Huff  Procedure(s) Performed: Procedure(s): Lumbar one-two Laminectomy with placement of coflex (N/A)  Patient Location: PACU  Anesthesia Type:General  Level of Consciousness: awake, oriented and patient cooperative  Airway & Oxygen Therapy: Patient Spontanous Breathing and Patient connected to face mask oxygen  Post-op Assessment: Report given to RN and Post -op Vital signs reviewed and stable  Post vital signs: Reviewed  Last Vitals:  Vitals:   09/15/15 0640  BP: (!) 119/45  Pulse: 73  Resp: 20  Temp: 37.2 C    Last Pain:  Vitals:   09/15/15 0640  TempSrc: Oral  PainSc:          Complications: No apparent anesthesia complications

## 2015-09-15 NOTE — Op Note (Signed)
Date of surgery: 09/15/2015 Preoperative diagnosis: Lumbar stenosis L1-L2, status post arthrodesis L2-L5, neurogenic claudication, lumbar radiculopathy. Postoperative diagnosis: Same Procedure: Bilateral laminotomies L1-L2 decompression of lumbar stenosis L1-L2, placement of Coflex L1-L2 (10 mm) Surgeon: Kristeen Miss First assistant: Ashley Jacobs M.D. Anesthesia: Gen. endotracheal Indications: Marissa Huff is an 82 year old individual who some 20 years ago underwent the S and fusion from L2-L5. She did well for a number of years but lately has been having increasing back pain with leg pain and weakness she has severe stenosis at the level of L1-L2 on MRI. She is advised regarding surgical intervention decompress L1-L2 in place Coflex.  Procedure: The patient was brought to the operating room supine on a stretcher. After the smooth induction of general endotracheal anesthesia, she was turned prone. The back was prepped with alcohol DuraPrep and draped in a sterile fashion. L1-L2 was localized with a radiograph. A vertical incision was created over this region. This was carried down to the lumbar dorsal fascia. Fascia was opened on either side of midline. Subperiosteal dissection was then performed to expose intralaminar space at L1-L2. Self-retaining retractor was placed deep in the wound. The internal laminar ligament was then taken down with the 30 curette and high-speed drill was used to remove the inferior marginal lamina of L1-L2 the medial wall the facet. Partial mesial facetectomy was performed at the L1-L2 level. The deeper layer of yellow ligament was then removed and the common dural tube was explored high-speed drill was used to enlarge the laminotomy in this region particularly by removing the superior portion of the laminar arch of L2. A small dural tear became apparent on the dorsal surface of the dura. The dura here was densely adherent to the undersurface of the lamina dissection this  region created a weakening in the dural surface. Careful dissection around this enlarge the opening and then with the use of the operating microscope were able to dissect the dura free and closed the opening with a singular 6-0 Prolene suture in a figure-of-eight. With this then the dissection was carefully continued superiorly removing lateral thickening from the mesial aspect of the superior aspect of the facet joint. This allowed for good decompression of the common dural tube and the L1 nerve root superiorly and the L2 nerve root inferiorly. Once the decompression was completed the inter spinous space was then sized for a spacer. It was felt that the L1-L2 level would be best fitted with a 10 mm Coflex. This was then prepared and placed into position. Radiographic confirmation of the placement was obtained and the wings were bent back fully. Final radiographic confirmation was obtained. Over the dural closure there was placed a small piece of DuraGen and this was held in place with the Coflex itself. No spinal fluid leakage had occurred after this. The retractors were removed the area was irrigated copiously with antibiotic irrigating solution, hemostasis was meticulously obtained in the soft tissues and then the lumbar dorsal fascia was closed with #1 Vicryl. 2-0 Vicryl was used in the subcutaneous anus tissues, 3-0 Vicryl subcuticularly. Dermabond was used in the skin. Patient tolerated the procedure well was returned to recovery room in stable condition.

## 2015-09-16 ENCOUNTER — Encounter (HOSPITAL_COMMUNITY): Payer: Self-pay | Admitting: Neurological Surgery

## 2015-09-16 DIAGNOSIS — M4806 Spinal stenosis, lumbar region: Secondary | ICD-10-CM | POA: Diagnosis not present

## 2015-09-16 LAB — GLUCOSE, CAPILLARY
Glucose-Capillary: 159 mg/dL — ABNORMAL HIGH (ref 65–99)
Glucose-Capillary: 202 mg/dL — ABNORMAL HIGH (ref 65–99)
Glucose-Capillary: 125 mg/dL — ABNORMAL HIGH (ref 65–99)
Glucose-Capillary: 204 mg/dL — ABNORMAL HIGH (ref 65–99)

## 2015-09-16 MED ORDER — DIAZEPAM 5 MG PO TABS: 5.0000 mg | ORAL_TABLET | Freq: Four times a day (QID) | ORAL | 0 refills | Status: DC | PRN

## 2015-09-16 MED ORDER — HYDROCODONE-ACETAMINOPHEN 5-325 MG PO TABS: 1.0000 | ORAL_TABLET | ORAL | 0 refills | Status: DC | PRN

## 2015-09-16 NOTE — Progress Notes (Signed)
PT Cancellation Note  Patient Details Name: Marissa Huff MRN: [id number] DOB: Aug 23, 1931   Cancelled Treatment:    Reason Eval/Treat Not Completed: Other (comment). Observed pt ambulating early this AM with family without AD or difficulty. Pt sleeping soundly upon PT arrival, asked family if they had any questions and went over back precautions. Pt has been through back surgery before and is familiar. PT to let pt rest and will return as able if needed.   Kingsley Callander 09/16/2015, 2:35 PM   Kittie Plater, PT, DPT Pager #: [phone] Office #: [phone]

## 2015-09-16 NOTE — Anesthesia Postprocedure Evaluation (Signed)
Anesthesia Post Note  Patient: Marissa Huff  Procedure(s) Performed: Procedure(s) (LRB): Lumbar one-two Laminectomy with placement of coflex (N/A)  Patient location during evaluation: PACU Anesthesia Type: General Level of consciousness: sedated and patient cooperative Pain management: pain level controlled Vital Signs Assessment: post-procedure vital signs reviewed and stable Respiratory status: spontaneous breathing Cardiovascular status: stable Anesthetic complications: no    Last Vitals:  Vitals:   09/16/15 0400 09/16/15 0747  BP: (!) 125/52 117/63  Pulse: 85 88  Resp: 20 18  Temp: 37.2 C 36.7 C    Last Pain:  Vitals:   09/16/15 0930  TempSrc:   PainSc: [place]

## 2015-09-16 NOTE — Discharge Summary (Signed)
Physician Discharge Summary  Patient ID: Marissa Huff MRN: [id number] DOB/AGE: 80-Dec-1933 80 y.o.  Admit date: 09/15/2015 Discharge date: 09/17/2015  Admission Diagnoses:Lumbar stenosis L1-L2 with neurogenic claudication and radiculopathy  Discharge Diagnoses: Lumbar stenosis L1-L2 with neurogenic claudication and radiculopathy. Active Problems:   Lumbar stenosis with neurogenic claudication   Discharged Condition: good  Hospital Course: Patient was admitted to undergo surgical decompression at L1-L2 with placement of Coflex. She tolerated surgery well  Consults: None  Significant Diagnostic Studies: None  Treatments: surgery: Decompression of L1-L2 with bilateral laminotomies foraminotomies, placement of Coflex L1-L2  Discharge Exam: Blood pressure (!) 128/58, pulse 68, temperature 98.2 F (36.8 C), temperature source Oral, resp. rate 18, height 5' 4"  (1.626 m), weight 80.3 kg (177 lb), SpO2 94 %. Motor function is intact in lower extremities incision is clean and dry.  Disposition: 01-Home or Self Care  Discharge Instructions    Call MD for:  redness, tenderness, or signs of infection (pain, swelling, redness, odor or green/yellow discharge around incision site)    Complete by:  As directed    Call MD for:  severe uncontrolled pain    Complete by:  As directed    Call MD for:  temperature >100.4    Complete by:  As directed    Diet - low sodium heart healthy    Complete by:  As directed    Increase activity slowly    Complete by:  As directed        Medication List    TAKE these medications   ACCU-CHEK AVIVA PLUS test strip Generic drug:  glucose blood CHECK SUGAR 4 TIMES DAILY DX E11.8 What changed:  Another medication with the same name was changed. Make sure you understand how and when to take each.   glucose blood test strip Use as instructed What changed:  how much to take  how to take this  when to take this  additional instructions    accu-chek soft touch lancets Use as instructed. Dx E11.8 What changed:  how much to take  how to take this  when to take this  additional instructions   albuterol 108 (90 Base) MCG/ACT inhaler Commonly known as:  PROVENTIL HFA;VENTOLIN HFA Inhale 2 puffs into the lungs every 6 (six) hours as needed for wheezing or shortness of breath.   amLODipine 5 MG tablet Commonly known as:  NORVASC TAKE 1 TABLET (5 MG TOTAL) BY MOUTH DAILY.   aspirin 81 MG tablet Take 81 mg by mouth daily.   Biotin 2.5 MG Caps Take 2.5 mg by mouth daily.   blood glucose meter kit and supplies Kit Dispense based on patient and insurance preference. Use up to four times daily as directed. (FOR ICD-10:E11.65)   CALCIUM + D PO Take 1 tablet by mouth daily.   diazepam 5 MG tablet Commonly known as:  VALIUM Take 1 tablet (5 mg total) by mouth every 6 (six) hours as needed for muscle spasms.   fexofenadine 180 MG tablet Commonly known as:  ALLEGRA Take 180 mg by mouth daily.   fluticasone 50 MCG/ACT nasal spray Commonly known as:  FLONASE Place 2 sprays into both nostrils daily.   glucagon 1 MG injection Commonly known as:  GLUCAGON EMERGENCY Inject 1 mg into the muscle once as needed.   HYDROcodone-acetaminophen 5-325 MG tablet Commonly known as:  NORCO/VICODIN Take 1-2 tablets by mouth every 4 (four) hours as needed (mild pain).   imipramine 25 MG tablet Commonly known as:  TOFRANIL Take 25 mg by mouth at bedtime.   insulin glargine 100 UNIT/ML injection Commonly known as:  LANTUS Inject 0.33 mLs (33 Units total) into the skin at bedtime. Pens please  - SOLOSTAR What changed:  how much to take  additional instructions   insulin lispro 100 UNIT/ML KiwkPen Commonly known as:  HUMALOG KWIKPEN Inject on a sliding scale between 14-28 units with each meal. Max daily dose 90 What changed:  how much to take  how to take this  when to take this  additional instructions    LORazepam 0.5 MG tablet Commonly known as:  ATIVAN 1/2 tablet q day prn What changed:  how much to take  how to take this  when to take this  reasons to take this  additional instructions   metFORMIN 500 MG 24 hr tablet Commonly known as:  GLUCOPHAGE-XR TAKE 1 TABLET (500 MG TOTAL) BY MOUTH 2 (TWO) TIMES DAILY.   PRESERVISION AREDS PO Take 1 capsule by mouth 2 (two) times daily.   MULTIVITAMIN PO Take 1 tablet by mouth daily.   pantoprazole 40 MG tablet Commonly known as:  PROTONIX TAKE 1 TABLET (40 MG TOTAL) BY MOUTH 2 (TWO) TIMES DAILY.   SYNTHROID 175 MCG tablet Generic drug:  levothyroxine TAKE 1 TABLET (175 MCG TOTAL) BY MOUTH DAILY BEFORE BREAKFAST.   traMADol 50 MG tablet Commonly known as:  ULTRAM Take 1 tablet (50 mg total) by mouth 4 (four) times daily as needed for moderate pain.   valsartan-hydrochlorothiazide 160-12.5 MG tablet Commonly known as:  DIOVAN-HCT TAKE 1 TABLET BY MOUTH DAILY.        SignedEarleen Newport 09/16/2015, 8:33 PM

## 2015-09-16 NOTE — Progress Notes (Signed)
Patient ID: Marissa Huff, female   DOB: December 24, 1931, 82 y.o.   MRN: [id number] Vital signs are stable Motor function appears intact Doing quite well postoperatively Stable

## 2015-09-16 NOTE — Evaluation (Signed)
Occupational Therapy Evaluation Patient Details Name: Marissa Huff MRN: [id number] DOB: August 14, 1931 Today's Date: 09/16/2015    History of Present Illness Marissa Huff is an 82 year old individual who some 20 years ago underwent the S and fusion from L2-L5. She did well for a number of years but lately has been having increasing back pain with leg pain and weakness she has severe stenosis at the level of L1-L2 on MRI.  She underwent bilateral laminotomies L1-L2 decompression of lumbar stenosis L1-L2, placement of Coflex L1-L2 (10 mm)   Clinical Impression   Pt reports she was independent with ADL PTA. Currently pt overall min guard for functional mobility and min assist for ADL. Began back, safety, and ADL education with pt and husband. Pt planning to d/c home with 24/7 supervision from family. Pt would benefit from continued skilled OT to address established goals.    Follow Up Recommendations  No OT follow up;Supervision/Assistance - 24 hour    Equipment Recommendations  None recommended by OT    Recommendations for Other Services       Precautions / Restrictions Precautions Precautions: Fall;Back Precaution Booklet Issued: Yes (comment) Precaution Comments: Educated pt on back precautions. Pt unable to recall precautions at start of session Required Braces or Orthoses: Spinal Brace Spinal Brace: Lumbar corset;Applied in sitting position Restrictions Weight Bearing Restrictions: No      Mobility Bed Mobility               General bed mobility comments: Pt sitting EOB upon arrival.  Transfers Overall transfer level: Needs assistance Equipment used: 1 person hand held assist Transfers: Sit to/from Stand Sit to Stand: Min assist         General transfer comment: Husband assisting with transfer; pt reports husband had to assist PTA. Maintains precautions throughout.    Balance Overall balance assessment: Needs assistance Sitting-balance support: Feet  supported;No upper extremity supported Sitting balance-Leahy Scale: Fair     Standing balance support: No upper extremity supported;During functional activity Standing balance-Leahy Scale: Fair                              ADL Overall ADL's : Needs assistance/impaired Eating/Feeding: Set up;Sitting   Grooming: Min guard;Standing   Upper Body Bathing: Set up;Supervision/ safety;Sitting   Lower Body Bathing: Minimal assistance;Sit to/from stand   Upper Body Dressing : Minimal assistance;Sitting Upper Body Dressing Details (indicate cue type and reason): to don back brace. Educated husband on assisting with donning/doffing back brace Lower Body Dressing: Moderate assistance;Sit to/from stand Lower Body Dressing Details (indicate cue type and reason): Pt unable to cross foot over opposite knee. Pt reports husband can assist with LB ADL as needed; no need for AE. Toilet Transfer: Min guard;Ambulation;BSC Toilet Transfer Details (indicate cue type and reason): Simulated by sit to stand from EOB with functional mobility in room. Toileting- Clothing Manipulation and Hygiene: Maximal assistance;Sit to/from stand   Tub/ Shower Transfer: Min guard;Ambulation;Shower seat;Walk-in shower   Functional mobility during ADLs: Min guard General ADL Comments: Pt mildly unsteady on feet; improved with continued mobility. Educated pt on maintaining back precautions during functional activities, use of 3 in 1 over toilet, use of shower bench for safety and supervision for shower transfers, frequent mobility upon return home.     Vision Vision Assessment?: No apparent visual deficits   Perception     Praxis      Pertinent Vitals/Pain Pain Assessment: Faces Faces Pain Scale:  Hurts even more Pain Location: back Pain Descriptors / Indicators: Grimacing;Guarding Pain Intervention(s): Monitored during session     Hand Dominance     Extremity/Trunk Assessment Upper Extremity  Assessment Upper Extremity Assessment: Overall WFL for tasks assessed   Lower Extremity Assessment Lower Extremity Assessment: Defer to PT evaluation   Cervical / Trunk Assessment Cervical / Trunk Assessment: Other exceptions Cervical / Trunk Exceptions: s/p spinal sx   Communication Communication Communication: No difficulties   Cognition Arousal/Alertness: Awake/alert Behavior During Therapy: WFL for tasks assessed/performed Overall Cognitive Status: Within Functional Limits for tasks assessed                     General Comments       Exercises       Shoulder Instructions      Home Living Family/patient expects to be discharged to:: Private residence Living Arrangements: Spouse/significant other;Children Available Help at Discharge: Family;Available 24 hours/day Type of Home: House Home Access: Stairs to enter [employer] of Steps: 3 Entrance Stairs-Rails: None Home Layout: Two level;Bed/bath upstairs Alternate Level Stairs-Number of Steps: flight Alternate Level Stairs-Rails: Left Bathroom Shower/Tub: Hospital doctor Toilet: Handicapped height     Home Equipment: Environmental consultant - 2 wheels;Bedside commode;Shower seat - built in          Prior Functioning/Environment Level of Independence: Independent             OT Diagnosis: Acute pain;Generalized weakness   OT Problem List: Decreased strength;Decreased activity tolerance;Impaired balance (sitting and/or standing);Decreased knowledge of use of DME or AE;Decreased knowledge of precautions;Decreased safety awareness;Pain   OT Treatment/Interventions: Self-care/ADL training;Energy conservation;DME and/or AE instruction;Therapeutic activities;Patient/family education;Balance training    OT Goals(Current goals can be found in the care plan section) Acute Rehab OT Goals Patient Stated Goal: To go home OT Goal Formulation: With patient/family Time For Goal Achievement:  09/30/15 Potential to Achieve Goals: Good ADL Goals Pt Will Transfer to Toilet: with supervision;ambulating;bedside commode Pt Will Perform Toileting - Clothing Manipulation and hygiene: with supervision;sit to/from stand (with or without AE) Additional ADL Goal #1: Pt will independently verbally recall 3/3 back precautions and maintain throughout ADL. Additional ADL Goal #2: Pt will don/doff back brace with set up as precursor for ADL and functional mobility.  OT Frequency: Min 2X/week   Barriers to D/C:            Co-evaluation              End of Session Equipment Utilized During Treatment: Back brace Nurse Communication: Mobility status  Activity Tolerance: Patient tolerated treatment well Patient left: with call bell/phone within reach;with nursing/sitter in room;with family/visitor present (sitting EOB)   Time: [id number] OT Time Calculation (min): 18 min Charges:  OT General Charges $OT Visit: 1 Procedure OT Evaluation $OT Eval Moderate Complexity: 1 Procedure G-Codes:     Binnie Kand M.S., OTR/L Pager: [phone]  09/16/2015, 8:56 AM

## 2015-09-17 DIAGNOSIS — M4806 Spinal stenosis, lumbar region: Secondary | ICD-10-CM | POA: Diagnosis not present

## 2015-09-17 NOTE — Progress Notes (Signed)
Occupational Therapy Treatment Patient Details Name: Marissa Huff MRN: [id number] DOB: 11-Oct-1931 Today's Date: 09/17/2015    History of present illness Marissa Huff is an 82 year old individual who some 20 years ago underwent the S and fusion from L2-L5. She did well for a number of years but lately has been having increasing back pain with leg pain and weakness she has severe stenosis at the level of L1-L2 on MRI.  She underwent bilateral laminotomies L1-L2 decompression of lumbar stenosis L1-L2, placement of Coflex L1-L2 (10 mm)   OT comments  Patient is discharging home today with her husband's assistance. Reviewed back precautions and ADL implications with patient. She verbalized understanding.    Follow Up Recommendations  No OT follow up;Supervision/Assistance - 24 hour    Equipment Recommendations  None recommended by OT    Recommendations for Other Services      Precautions / Restrictions Precautions Precautions: Fall;Back Precaution Comments: REviewed back precautions with patient. Patient reports she has the handout packed in her bag. Required Braces or Orthoses: Spinal Brace Spinal Brace: Lumbar corset;Applied in sitting position Restrictions Weight Bearing Restrictions: No       Mobility Bed Mobility               General bed mobility comments: Pt sitting EOB upon arrival.  Transfers Overall transfer level: Needs assistance Equipment used: 1 person hand held assist Transfers: Sit to/from Stand Sit to Stand: Min guard              Balance                                   ADL Overall ADL's : Needs assistance/impaired                                     Functional mobility during ADLs: Min guard General ADL Comments: Mildly unsteady on feet, but is all packed up to go home today. She reported 10/10 pain but stated RN just gave pain medications. Reviewed back precautions with patient and pt has handout. Reviewed  ADL implications. Patient verbalized understanding. Patient has a walk-in shower with seat. She did not feel like she needed to practice shower transfer simulation. RN in with w/c to take patient down to discharge.       Vision                     Perception     Praxis      Cognition   Behavior During Therapy: WFL for tasks assessed/performed Overall Cognitive Status: Within Functional Limits for tasks assessed                       Extremity/Trunk Assessment               Exercises     Shoulder Instructions       General Comments      Pertinent Vitals/ Pain       Pain Assessment: 0-10 Pain Score: 10-Worst pain ever Pain Location: back Pain Descriptors / Indicators: Grimacing;Operative site guarding Pain Intervention(s): Premedicated before session (pt reports nurse just gave pain medications)  Home Living  Prior Functioning/Environment              Frequency       Progress Toward Goals  OT Goals(current goals can now be found in the care plan section)  Progress towards OT goals: Goals met/education completed, patient discharged from [facility] All goals met and education completed, patient discharged from OT services    Co-evaluation                 End of Session Equipment Utilized During Treatment: Back brace   Activity Tolerance Patient tolerated treatment well   Patient Left Other (comment) (RN in with w/c to discharge patient)   Nurse Communication Mobility status    Functional Assessment Tool Used: clinical judgment Functional Limitation: Self care Self Care Current Status (Y8185): At least 1 percent but less than 20 percent impaired, limited or restricted Self Care Goal Status (T0931): At least 1 percent but less than 20 percent impaired, limited or restricted Self Care Discharge Status [phone]): At least 1 percent but less than 20 percent impaired,  limited or restricted   Time: 4469-5072 OT Time Calculation (min): 14 min  Charges: OT G-codes **NOT FOR INPATIENT CLASS** Functional Assessment Tool Used: clinical judgment Functional Limitation: Self care Self Care Current Status (U5750): At least 1 percent but less than 20 percent impaired, limited or restricted Self Care Goal Status (N1833): At least 1 percent but less than 20 percent impaired, limited or restricted Self Care Discharge Status [phone]): At least 1 percent but less than 20 percent impaired, limited or restricted OT General Charges $OT Visit: 1 Procedure OT Treatments $Self Care/Home Management : 8-22 mins  Laasia Arcos A 09/17/2015, 9:29 AM

## 2015-09-17 NOTE — Progress Notes (Signed)
Pt. discharged home accompanied by husband. Prescriptions and discharge instructions given with verbalization of understanding. Incision site on back with no s/s of infection - no swelling, redness, bleeding, and/or drainage noted. Opportunity given to ask questions but no question asked. Pt. transported out of this unit in wheelchair by the volunteer   

## 2015-09-18 NOTE — Progress Notes (Signed)
Late entry for missed gcode    09/16/15 0849  OT Time Calculation  OT Start Time (ACUTE ONLY) 0758  OT Stop Time (ACUTE ONLY) 0816  OT Time Calculation (min) 18 min  OT G-codes **NOT FOR INPATIENT CLASS**  Functional Assessment Tool Used Clinical judgement  Functional Limitation Self care  Self Care Current Status [id number]) CI  Self Care Goal Status [id number]) CI  Self Care Discharge Status [id number]) CI  OT General Charges  $OT Visit 1 Procedure  OT Evaluation  $OT Eval Moderate Complexity 1 Procedure   Truman Hayward M.S., OTR/L  Pager: [phone]

## 2015-09-25 NOTE — Progress Notes (Signed)
PT G-Code Note    09/15/15 1706  PT G-Codes **NOT FOR INPATIENT CLASS**  Functional Assessment Tool Used Clinical Judgement  Functional Limitation Mobility: Walking and moving around  Mobility: Walking and Moving Around Current Status [phone]) CI  Mobility: Walking and Moving Around Goal Status [phone]) CI  [place], [place] [phone] 09/25/2015

## 2015-10-01 DIAGNOSIS — M4806 Spinal stenosis, lumbar region: Secondary | ICD-10-CM | POA: Diagnosis not present

## 2015-10-02 ENCOUNTER — Encounter: Payer: Self-pay | Admitting: Internal Medicine

## 2015-10-02 ENCOUNTER — Ambulatory Visit (INDEPENDENT_AMBULATORY_CARE_PROVIDER_SITE_OTHER): Payer: PPO | Admitting: Internal Medicine

## 2015-10-02 DIAGNOSIS — E038 Other specified hypothyroidism: Secondary | ICD-10-CM

## 2015-10-02 DIAGNOSIS — IMO0001 Reserved for inherently not codable concepts without codable children: Secondary | ICD-10-CM

## 2015-10-02 DIAGNOSIS — E1165 Type 2 diabetes mellitus with hyperglycemia: Secondary | ICD-10-CM

## 2015-10-02 DIAGNOSIS — I10 Essential (primary) hypertension: Secondary | ICD-10-CM

## 2015-10-02 DIAGNOSIS — Z23 Encounter for immunization: Secondary | ICD-10-CM | POA: Diagnosis not present

## 2015-10-02 DIAGNOSIS — K219 Gastro-esophageal reflux disease without esophagitis: Secondary | ICD-10-CM

## 2015-10-02 DIAGNOSIS — Z794 Long term (current) use of insulin: Secondary | ICD-10-CM

## 2015-10-02 DIAGNOSIS — E669 Obesity, unspecified: Secondary | ICD-10-CM

## 2015-10-02 DIAGNOSIS — M4800 Spinal stenosis, site unspecified: Secondary | ICD-10-CM

## 2015-10-02 DIAGNOSIS — E7800 Pure hypercholesterolemia, unspecified: Secondary | ICD-10-CM

## 2015-10-02 NOTE — Progress Notes (Signed)
Patient ID: Marissa Huff, female   DOB: 06/23/1931, 82 y.o.   MRN: [id number]   Subjective:    Patient ID: Marissa Huff, female    DOB: 05/14/1931, 82 y.o.   MRN: [id number]  HPI  Patient here for a scheduled follow up.  She is accompanied by her husband.  History obtained from both of them.   She is s/p back surgery 09/15/15.  Seeing Dr Elsner.  Doing better.  Taking tramadol.  She is off imipramine.  Felt it was not helping now.  Sees Dr Cope.  Breathing stable.  States her sugars are doing better.  Trying to watch her diet.  No nausea or vomiting.  Bowels stable.  Last a1c - 6.1.     Past Medical History:  Diagnosis Date  . Allergy   . Arthritis   . Carpal tunnel syndrome, bilateral   . Diabetes mellitus without complication (HCC)   . Diverticulosis   . Gastric lymphoma (HCC)   . Gastroparesis   . GERD (gastroesophageal reflux disease)   . Hypertension   . Hypothyroidism   . PUD (peptic ulcer disease)   . Skin cancer   . Spinal stenosis    lumbar laminectomy   Past Surgical History:  Procedure Laterality Date  . ABDOMINAL HYSTERECTOMY    . antrectomy and vagotomy  1994  . arthroscopic left knee  12/05/08  . BACK SURGERY    . carpal tunnel repai  10/10   left   . CATARACT EXTRACTION, BILATERAL    . EYE SURGERY     bilateral cataracts  . 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 LAPAROSCOPIC CHOLECYSTECTOMY  6/01  . LUMBAR LAMINECTOMY  1999  . LUMBAR LAMINECTOMY WITH COFLEX 1 LEVEL N/A 09/15/2015   Procedure: Lumbar one-two Laminectomy with placement of coflex;  Surgeon: Henry Elsner, MD;  Location: [facility];  Service: Neurosurgery;  Laterality: N/A;  . PARTIAL HYSTERECTOMY  1960   fibroid tumors, ovaries not removed  . tonsillectomy    . TONSILLECTOMY     Family History  Problem Relation Age of Onset  . Stroke Mother   . Stroke Father   . Hypertension Father   . Diabetes Brother   . Breast cancer Daughter 48  . Colon cancer Neg Hx    Social History   Social  History  . Marital status: Married    Spouse name: N/A  . Number of children: N/A  . Years of education: N/A   Social History Main Topics  . Smoking status: Never Smoker  . Smokeless tobacco: Never Used  . Alcohol use No  . Drug use: No  . Sexual activity: No   Other Topics Concern  . None   Social History Narrative  . None    Outpatient Encounter Prescriptions as of 10/02/2015  Medication Sig  . ACCU-CHEK AVIVA PLUS test strip CHECK SUGAR 4 TIMES DAILY DX E11.8  . albuterol (PROVENTIL HFA;VENTOLIN HFA) 108 (90 BASE) MCG/ACT inhaler Inhale 2 puffs into the lungs every 6 (six) hours as needed for wheezing or shortness of breath.  . amLODipine (NORVASC) 5 MG tablet TAKE 1 TABLET (5 MG TOTAL) BY MOUTH DAILY.  . aspirin 81 MG tablet Take 81 mg by mouth daily.  . Biotin 2.5 MG CAPS Take 2.5 mg by mouth daily.  . blood glucose meter kit and supplies KIT Dispense based on patient and insurance preference. Use up to four times daily as directed. (FOR ICD-10:E11.65)  . Calcium   Citrate-Vitamin D (CALCIUM + D PO) Take 1 tablet by mouth daily.  . diazepam (VALIUM) 5 MG tablet Take 1 tablet (5 mg total) by mouth every 6 (six) hours as needed for muscle spasms.  . fexofenadine (ALLEGRA) 180 MG tablet Take 180 mg by mouth daily.  . fluticasone (FLONASE) 50 MCG/ACT nasal spray Place 2 sprays into both nostrils daily.  Marland Kitchen glucagon (GLUCAGON EMERGENCY) 1 MG injection Inject 1 mg into the muscle once as needed.  Marland Kitchen glucose blood test strip Use as instructed (Patient taking differently: 1 each by Other route See admin instructions. Check blood sugar 4 times daily)  . imipramine (TOFRANIL) 25 MG tablet Take 25 mg by mouth at bedtime.  . insulin glargine (LANTUS) 100 UNIT/ML injection Inject 0.33 mLs (33 Units total) into the skin at bedtime. Pens please  - SOLOSTAR (Patient taking differently: Inject 24 Units into the skin at bedtime. Pens please  - SOLOSTAR)  . insulin lispro (HUMALOG KWIKPEN) 100  UNIT/ML KiwkPen Inject on a sliding scale between 14-28 units with each meal. Max daily dose 90 (Patient taking differently: Inject 6-11 Units into the skin 3 (three) times daily. Per sliding scale)  . Lancets (ACCU-CHEK SOFT TOUCH) lancets Use as instructed. Dx E11.8 (Patient taking differently: 1 each by Other route See admin instructions. Check blood sugar 4 times daily)  . LORazepam (ATIVAN) 0.5 MG tablet 1/2 tablet q day prn (Patient taking differently: Take 0.25-0.5 mg by mouth daily as needed for anxiety. )  . metFORMIN (GLUCOPHAGE-XR) 500 MG 24 hr tablet TAKE 1 TABLET (500 MG TOTAL) BY MOUTH 2 (TWO) TIMES DAILY.  . Multiple Vitamins-Minerals (MULTIVITAMIN PO) Take 1 tablet by mouth daily.   . Multiple Vitamins-Minerals (PRESERVISION AREDS PO) Take 1 capsule by mouth 2 (two) times daily.  . pantoprazole (PROTONIX) 40 MG tablet TAKE 1 TABLET (40 MG TOTAL) BY MOUTH 2 (TWO) TIMES DAILY.  . SYNTHROID 175 MCG tablet TAKE 1 TABLET (175 MCG TOTAL) BY MOUTH DAILY BEFORE BREAKFAST.  Marland Kitchen traMADol (ULTRAM) 50 MG tablet Take 1 tablet (50 mg total) by mouth 4 (four) times daily as needed for moderate pain.  . valsartan-hydrochlorothiazide (DIOVAN-HCT) 160-12.5 MG tablet TAKE 1 TABLET BY MOUTH DAILY.  . [DISCONTINUED] HYDROcodone-acetaminophen (NORCO/VICODIN) 5-325 MG tablet Take 1-2 tablets by mouth every 4 (four) hours as needed (mild pain).   No facility-administered encounter medications on file as of 10/02/2015.     Review of Systems  Constitutional: Negative for appetite change and unexpected weight change.  HENT: Negative for congestion and sinus pressure.   Respiratory: Negative for cough, chest tightness and shortness of breath.   Cardiovascular: Negative for chest pain, palpitations and leg swelling.  Gastrointestinal: Negative for abdominal pain, diarrhea, nausea and vomiting.  Genitourinary: Negative for difficulty urinating and dysuria.  Musculoskeletal: Positive for back pain. Negative for  joint swelling.  Skin: Negative for color change and rash.  Neurological: Negative for dizziness, light-headedness and headaches.  Psychiatric/Behavioral: Negative for agitation and dysphoric mood.       Objective:    Physical Exam  Constitutional: She appears well-developed and well-nourished. No distress.  HENT:  Nose: Nose normal.  Mouth/Throat: Oropharynx is clear and moist.  Neck: Neck supple. No thyromegaly present.  Cardiovascular: Normal rate and regular rhythm.   Pulmonary/Chest: Breath sounds normal. No respiratory distress. She has no wheezes.  Abdominal: Soft. Bowel sounds are normal. There is no tenderness.  Musculoskeletal: She exhibits no edema or tenderness.  Lymphadenopathy:    She has  no cervical adenopathy.  Skin: No rash noted. No erythema.  Psychiatric: She has a normal mood and affect. Her behavior is normal.    BP 118/62   Pulse 61   Temp 98.1 F (36.7 C) (Oral)   Ht 5' 4" (1.626 m)   Wt 182 lb 3.2 oz (82.6 kg)   SpO2 98%   BMI 31.27 kg/m  Wt Readings from Last 3 Encounters:  10/02/15 182 lb 3.2 oz (82.6 kg)  09/15/15 177 lb (80.3 kg)  09/09/15 177 lb (80.3 kg)     Lab Results  Component Value Date   WBC 6.6 09/09/2015   HGB 11.0 (L) 09/09/2015   HCT 34.0 (L) 09/09/2015   PLT 233 09/09/2015   GLUCOSE 197 (H) 09/09/2015   CHOL 141 03/24/2015   TRIG 68.0 03/24/2015   HDL 50.40 03/24/2015   LDLCALC 77 03/24/2015   ALT 14 03/24/2015   AST 15 03/24/2015   NA 138 09/09/2015   K 3.6 09/09/2015   CL 106 09/09/2015   CREATININE 0.83 09/09/2015   BUN 22 (H) 09/09/2015   CO2 25 09/09/2015   TSH 0.91 06/11/2014   HGBA1C 6.1 (H) 09/09/2015   MICROALBUR 0.9 06/11/2014       Assessment & Plan:   Problem List Items Addressed This Visit    GERD (gastroesophageal reflux disease)    Controlled on protonix.        Hypercholesterolemia    Low cholesterol diet and exercise.  Follow lipid panel and liver function tests.        Hypertension      Blood pressure under good control.  Continue same medication regimen.  Follow pressures.  Follow metabolic panel.        Hypothyroidism    On thyroid replacement.  Follow tsh.       Obesity (BMI 30-39.9)    Diet and exercise.        Spinal stenosis    S/p surgery.  Being followed by Dr Elsner.  Doing well.        Type 2 diabetes mellitus, uncontrolled (HCC)    Followed by Dr Paul.  Sugars doing better.  Last a1c 6.1.  Low carb diet and exercise.  Follow met b and a1c.        Other Visit Diagnoses    Encounter for immunization       Relevant Orders   Flu vaccine HIGH DOSE PF (Completed)     I spent 25 minutes with the patient and more than 50% of the time was spent in consultation regarding the above.     SCOTT, CHARLENE, MD  

## 2015-10-02 NOTE — Progress Notes (Signed)
Pre visit review using our clinic review tool, if applicable. No additional management support is needed unless otherwise documented below in the visit note. 

## 2015-10-04 ENCOUNTER — Encounter: Payer: Self-pay | Admitting: Internal Medicine

## 2015-10-04 NOTE — Assessment & Plan Note (Signed)
Blood pressure under good control.  Continue same medication regimen.  Follow pressures.  Follow metabolic panel.   

## 2015-10-04 NOTE — Assessment & Plan Note (Signed)
Followed by Dr Eddie Dibbles.  Sugars doing better.  Last a1c 6.1.  Low carb diet and exercise.  Follow met b and a1c.

## 2015-10-04 NOTE — Assessment & Plan Note (Signed)
S/p surgery.  Being followed by Dr Ellene Route.  Doing well.

## 2015-10-04 NOTE — Assessment & Plan Note (Signed)
Diet and exercise.   

## 2015-10-04 NOTE — Assessment & Plan Note (Signed)
Controlled on protonix.   

## 2015-10-04 NOTE — Assessment & Plan Note (Signed)
Low cholesterol diet and exercise.  Follow lipid panel and liver function tests.  

## 2015-10-04 NOTE — Assessment & Plan Note (Signed)
On thyroid replacement.  Follow tsh.  

## 2015-10-05 DIAGNOSIS — J301 Allergic rhinitis due to pollen: Secondary | ICD-10-CM | POA: Diagnosis not present

## 2015-10-06 DIAGNOSIS — Z794 Long term (current) use of insulin: Secondary | ICD-10-CM | POA: Diagnosis not present

## 2015-10-06 DIAGNOSIS — E11649 Type 2 diabetes mellitus with hypoglycemia without coma: Secondary | ICD-10-CM | POA: Diagnosis not present

## 2015-10-06 DIAGNOSIS — E039 Hypothyroidism, unspecified: Secondary | ICD-10-CM | POA: Diagnosis not present

## 2015-10-12 DIAGNOSIS — J301 Allergic rhinitis due to pollen: Secondary | ICD-10-CM | POA: Diagnosis not present

## 2015-10-13 ENCOUNTER — Other Ambulatory Visit: Payer: Self-pay

## 2015-10-13 MED ORDER — SYNTHROID 175 MCG PO TABS: 175.0000 ug | ORAL_TABLET | Freq: Every day | ORAL | 1 refills | Status: DC

## 2015-10-14 ENCOUNTER — Other Ambulatory Visit: Payer: Self-pay | Admitting: Internal Medicine

## 2015-10-19 ENCOUNTER — Other Ambulatory Visit: Payer: Self-pay | Admitting: Internal Medicine

## 2015-10-19 DIAGNOSIS — Z1231 Encounter for screening mammogram for malignant neoplasm of breast: Secondary | ICD-10-CM

## 2015-10-19 DIAGNOSIS — J301 Allergic rhinitis due to pollen: Secondary | ICD-10-CM | POA: Diagnosis not present

## 2015-10-21 ENCOUNTER — Other Ambulatory Visit: Payer: Self-pay | Admitting: Internal Medicine

## 2015-10-22 ENCOUNTER — Ambulatory Visit
Admission: RE | Admit: 2015-10-22 | Discharge: 2015-10-22 | Disposition: A | Discharge status: Home / Self Care | Payer: PPO | Source: Ambulatory Visit | Attending: Internal Medicine | Admitting: Internal Medicine

## 2015-10-22 DIAGNOSIS — Z1231 Encounter for screening mammogram for malignant neoplasm of breast: Secondary | ICD-10-CM | POA: Insufficient documentation

## 2015-10-23 LAB — HM MAMMOGRAPHY

## 2015-10-26 DIAGNOSIS — J301 Allergic rhinitis due to pollen: Secondary | ICD-10-CM | POA: Diagnosis not present

## 2015-10-29 DIAGNOSIS — J301 Allergic rhinitis due to pollen: Secondary | ICD-10-CM | POA: Diagnosis not present

## 2015-10-29 DIAGNOSIS — M48061 Spinal stenosis, lumbar region without neurogenic claudication: Secondary | ICD-10-CM | POA: Diagnosis not present

## 2015-11-02 ENCOUNTER — Other Ambulatory Visit (INDEPENDENT_AMBULATORY_CARE_PROVIDER_SITE_OTHER): Payer: PPO

## 2015-11-02 DIAGNOSIS — E7800 Pure hypercholesterolemia, unspecified: Secondary | ICD-10-CM

## 2015-11-02 DIAGNOSIS — Z794 Long term (current) use of insulin: Secondary | ICD-10-CM

## 2015-11-02 DIAGNOSIS — E1165 Type 2 diabetes mellitus with hyperglycemia: Secondary | ICD-10-CM

## 2015-11-02 DIAGNOSIS — E039 Hypothyroidism, unspecified: Secondary | ICD-10-CM

## 2015-11-02 DIAGNOSIS — IMO0001 Reserved for inherently not codable concepts without codable children: Secondary | ICD-10-CM

## 2015-11-02 DIAGNOSIS — D649 Anemia, unspecified: Secondary | ICD-10-CM

## 2015-11-02 DIAGNOSIS — J301 Allergic rhinitis due to pollen: Secondary | ICD-10-CM | POA: Diagnosis not present

## 2015-11-02 LAB — HEPATIC FUNCTION PANEL
ALT: 11 U/L (ref 0–35)
AST: 12 U/L (ref 0–37)
Albumin: 3.9 g/dL (ref 3.5–5.2)
Alkaline Phosphatase: 72 U/L (ref 39–117)
Bilirubin, Direct: 0.1 mg/dL (ref 0.0–0.3)
Total Bilirubin: 0.4 mg/dL (ref 0.2–1.2)
Total Protein: 6.9 g/dL (ref 6.0–8.3)

## 2015-11-02 LAB — CBC WITH DIFFERENTIAL/PLATELET
Basophils Absolute: 0 10*3/uL (ref 0.0–0.1)
Basophils Relative: 0.6 % (ref 0.0–3.0)
Eosinophils Absolute: 0.2 10*3/uL (ref 0.0–0.7)
Eosinophils Relative: 4 % (ref 0.0–5.0)
HCT: 34.5 % — ABNORMAL LOW (ref 36.0–46.0)
Hemoglobin: 11.7 g/dL — ABNORMAL LOW (ref 12.0–15.0)
Lymphocytes Relative: 20.7 % (ref 12.0–46.0)
Lymphs Abs: 1.3 10*3/uL (ref 0.7–4.0)
MCHC: 33.9 g/dL (ref 30.0–36.0)
MCV: 88.5 fl (ref 78.0–100.0)
Monocytes Absolute: 0.5 10*3/uL (ref 0.1–1.0)
Monocytes Relative: 7.7 % (ref 3.0–12.0)
Neutro Abs: 4.1 10*3/uL (ref 1.4–7.7)
Neutrophils Relative %: 67 % (ref 43.0–77.0)
Platelets: 253 10*3/uL (ref 150.0–400.0)
RBC: 3.9 Mil/uL (ref 3.87–5.11)
RDW: 14.3 % (ref 11.5–15.5)
WBC: 6.1 10*3/uL (ref 4.0–10.5)

## 2015-11-02 LAB — BASIC METABOLIC PANEL
BUN: 22 mg/dL (ref 6–23)
CO2: 27 mEq/L (ref 19–32)
Calcium: 9.6 mg/dL (ref 8.4–10.5)
Chloride: 105 mEq/L (ref 96–112)
Creatinine, Ser: 0.76 mg/dL (ref 0.40–1.20)
GFR: 77.31 mL/min (ref >60.00)
Glucose, Bld: 188 mg/dL — ABNORMAL HIGH (ref 70–99)
Potassium: 4.1 mEq/L (ref 3.5–5.1)
Sodium: 141 mEq/L (ref 135–145)

## 2015-11-02 LAB — LIPID PANEL
Cholesterol: 130 mg/dL (ref 0–200)
HDL: 55.2 mg/dL (ref >39.00)
LDL Cholesterol: 62 mg/dL (ref 0–99)
NonHDL: 74.66
Total CHOL/HDL Ratio: 2
Triglycerides: 65 mg/dL (ref 0.0–149.0)
VLDL: 13 mg/dL (ref 0.0–40.0)

## 2015-11-02 LAB — MICROALBUMIN / CREATININE URINE RATIO
Creatinine,U: 130.8 mg/dL
Microalb Creat Ratio: 2 mg/g (ref 0.0–30.0)
Microalb, Ur: 2.6 mg/dL — ABNORMAL HIGH (ref 0.0–1.9)

## 2015-11-02 LAB — HEMOGLOBIN A1C: Hgb A1c MFr Bld: 6.8 % — ABNORMAL HIGH (ref 4.6–6.5)

## 2015-11-02 LAB — FERRITIN: Ferritin: 23.9 ng/mL (ref 10.0–291.0)

## 2015-11-02 LAB — TSH: TSH: 0.24 u[IU]/mL — ABNORMAL LOW (ref 0.35–4.50)

## 2015-11-03 ENCOUNTER — Other Ambulatory Visit: Payer: Self-pay | Admitting: Internal Medicine

## 2015-11-03 ENCOUNTER — Telehealth: Payer: Self-pay

## 2015-11-03 DIAGNOSIS — E039 Hypothyroidism, unspecified: Secondary | ICD-10-CM

## 2015-11-03 MED ORDER — LEVOTHYROXINE SODIUM 150 MCG PO TABS: 150.0000 ug | ORAL_TABLET | Freq: Every day | ORAL | 0 refills | Status: DC

## 2015-11-03 NOTE — Progress Notes (Signed)
Order placed for f/u tsh.  

## 2015-11-03 NOTE — Telephone Encounter (Signed)
-----   Message from Einar Pheasant, MD sent at 11/03/2015  5:40 AM EDT ----- Notify pt that her tsh is suppressed.  Confirm taking synthroid 151mcg q day.  If so, then decrease synthroid to 138mcg q day.  Will need f/u tsh in 6 weeks.  Will also need to send in new rx.  hgb stable.  Still slightly decreased but overall stable.  Cholesterol looks good.  Overall sugar control ok.  a1c 6.8.  Kidney function tests and liver function tests are wnl.

## 2015-11-09 DIAGNOSIS — J301 Allergic rhinitis due to pollen: Secondary | ICD-10-CM | POA: Diagnosis not present

## 2015-11-16 DIAGNOSIS — J301 Allergic rhinitis due to pollen: Secondary | ICD-10-CM | POA: Diagnosis not present

## 2015-11-19 ENCOUNTER — Ambulatory Visit (INDEPENDENT_AMBULATORY_CARE_PROVIDER_SITE_OTHER): Payer: PPO

## 2015-11-19 ENCOUNTER — Encounter: Payer: Self-pay | Admitting: Family Medicine

## 2015-11-19 ENCOUNTER — Ambulatory Visit (INDEPENDENT_AMBULATORY_CARE_PROVIDER_SITE_OTHER): Payer: PPO | Admitting: Family Medicine

## 2015-11-19 VITALS — BP 130/72 | HR 87 | Temp 98.5°F | Resp 18 | Wt 177.0 lb

## 2015-11-19 DIAGNOSIS — J209 Acute bronchitis, unspecified: Secondary | ICD-10-CM

## 2015-11-19 MED ORDER — HYDROCOD POLST-CPM POLST ER 10-8 MG/5ML PO SUER: 5.0000 mL | Freq: Two times a day (BID) | ORAL | 0 refills | Status: DC | PRN

## 2015-11-19 MED ORDER — PREDNISONE 50 MG PO TABS: ORAL_TABLET | ORAL | 0 refills | Status: DC

## 2015-11-19 NOTE — Progress Notes (Signed)
Subjective:  Patient ID: Marissa Huff, female    DOB: 14-Dec-1931  Age: 82 y.o. MRN: [id number]  CC: Cough  HPI:  82 year old female with DM 2, hypertension, HLD, hypothyroidism presents with cough.  Patient reports that she's not been feeling well since Monday. She initially developed sore throat. This progressed to cough and associated shortness of breath. She reports that her cough is productive of yellow sputum. She also reports nasal congestion. She's been using Mucinex and over-the-counter cold medication without relief. No associated fevers or chills. No known exacerbating factors. No other associated symptoms. No other complaints this time.  Social Hx   Social History   Social History  . Marital status: Married    Spouse name: N/A  . Number of children: N/A  . Years of education: N/A   Social History Main Topics  . Smoking status: Never Smoker  . Smokeless tobacco: Never Used  . Alcohol use No  . Drug use: No  . Sexual activity: No   Other Topics Concern  . None   Social History Narrative  . None    Review of Systems  HENT: Positive for congestion and sore throat.   Respiratory: Positive for cough and shortness of breath.    Objective:  BP 130/72 (BP Location: Left Arm, Patient Position: Sitting, Cuff Size: Normal)   Pulse 87   Temp 98.5 F (36.9 C) (Oral)   Resp 18   Wt 177 lb (80.3 kg)   SpO2 97%   BMI 30.38 kg/m   BP/Weight 11/19/2015 10/02/2015 [id number]  Systolic BP [id number]  Diastolic BP 72 62 63  Wt. (Lbs) 177 182.2 -  BMI 30.38 31.27 -   Physical Exam  Constitutional: She is oriented to person, place, and time. She appears well-developed. No distress.  HENT:  Mouth/Throat: Oropharynx is clear and moist.  Cardiovascular: Normal rate and regular rhythm.   Pulmonary/Chest:  Coarse breath sounds and scattered wheezing.  Neurological: She is alert and oriented to person, place, and time.  Psychiatric: She has a normal mood and affect.    Vitals reviewed.  Lab Results  Component Value Date   WBC 6.1 11/02/2015   HGB 11.7 (L) 11/02/2015   HCT 34.5 (L) 11/02/2015   PLT 253.0 11/02/2015   GLUCOSE 188 (H) 11/02/2015   CHOL 130 11/02/2015   TRIG 65.0 11/02/2015   HDL 55.20 11/02/2015   LDLCALC 62 11/02/2015   ALT 11 11/02/2015   AST 12 11/02/2015   NA 141 11/02/2015   K 4.1 11/02/2015   CL 105 11/02/2015   CREATININE 0.76 11/02/2015   BUN 22 11/02/2015   CO2 27 11/02/2015   TSH 0.24 (L) 11/02/2015   HGBA1C 6.8 (H) 11/02/2015   MICROALBUR 2.6 (H) 11/02/2015    Assessment & Plan:   Problem List Items Addressed This Visit    Acute bronchitis - Primary    New acute problem. Chest xray today. Treating prednisone and tussionex.      Relevant Orders   DG Chest 2 View      Meds ordered this encounter  Medications  . predniSONE (DELTASONE) 50 MG tablet    Sig: 1 tablet daily x 5 days.    Dispense:  5 tablet    Refill:  0  . chlorpheniramine-HYDROcodone (TUSSIONEX PENNKINETIC ER) 10-8 MG/5ML SUER    Sig: Take 5 mLs by mouth every 12 (twelve) hours as needed.    Dispense:  115 mL    Refill:  0  Follow-up: PRN  [place]

## 2015-11-19 NOTE — Patient Instructions (Signed)
Prednisone and tussionex for your symptoms.  We will call with your xray results.  Take care  Dr. Lacinda Axon

## 2015-11-19 NOTE — Assessment & Plan Note (Signed)
New acute problem. Chest xray today. Treating prednisone and tussionex.

## 2015-11-23 DIAGNOSIS — J301 Allergic rhinitis due to pollen: Secondary | ICD-10-CM | POA: Diagnosis not present

## 2015-11-30 DIAGNOSIS — J301 Allergic rhinitis due to pollen: Secondary | ICD-10-CM | POA: Diagnosis not present

## 2015-12-02 DIAGNOSIS — R05 Cough: Secondary | ICD-10-CM | POA: Diagnosis not present

## 2015-12-02 DIAGNOSIS — H9209 Otalgia, unspecified ear: Secondary | ICD-10-CM | POA: Diagnosis not present

## 2015-12-07 DIAGNOSIS — J301 Allergic rhinitis due to pollen: Secondary | ICD-10-CM | POA: Diagnosis not present

## 2015-12-09 ENCOUNTER — Ambulatory Visit (INDEPENDENT_AMBULATORY_CARE_PROVIDER_SITE_OTHER): Payer: PPO

## 2015-12-09 VITALS — BP 120/60 | HR 70 | Temp 98.0°F | Resp 12 | Ht 63.5 in | Wt 179.4 lb

## 2015-12-09 DIAGNOSIS — Z23 Encounter for immunization: Secondary | ICD-10-CM

## 2015-12-09 DIAGNOSIS — Z0000 Encounter for general adult medical examination without abnormal findings: Secondary | ICD-10-CM

## 2015-12-09 NOTE — Progress Notes (Signed)
Subjective:   Marissa Huff is a 82 y.o. female who presents for Medicare Annual (Subsequent) preventive examination.  Review of Systems:  No ROS.  Medicare Wellness Visit.  Cardiac Risk Factors include: advanced age (>9mn, >>34women);diabetes mellitus;hypertension     Objective:     Vitals: BP 120/60 (BP Location: Left Arm, Patient Position: Sitting, Cuff Size: Normal)   Pulse 70   Temp 98 F (36.7 C) (Oral)   Resp 12   Ht 5' 3.5" (1.613 m)   Wt 179 lb 6.4 oz (81.4 kg)   SpO2 96%   BMI 31.28 kg/m   Body mass index is 31.28 kg/m.   Tobacco History  Smoking Status  . Never Smoker  Smokeless Tobacco  . Never Used     Counseling given: Not Answered   Past Medical History:  Diagnosis Date  . Allergy   . Arthritis   . Carpal tunnel syndrome, bilateral   . Diabetes mellitus without complication ([facility]   . Diverticulosis   . Gastric lymphoma ([facility]   . Gastroparesis   . GERD (gastroesophageal reflux disease)   . Hypertension   . Hypothyroidism   . PUD (peptic ulcer disease)   . Skin cancer   . Spinal stenosis    lumbar laminectomy   Past Surgical History:  Procedure Laterality Date  . ABDOMINAL HYSTERECTOMY    . antrectomy and vagotomy  1994  . arthroscopic left knee  12/05/08  . BACK SURGERY    . carpal tunnel repai  10/10   left   . CATARACT EXTRACTION, BILATERAL    . EYE SURGERY     bilateral cataracts  . 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 LAPAROSCOPIC CHOLECYSTECTOMY  6/01  . LUMBAR LAMINECTOMY  1999  . LUMBAR LAMINECTOMY WITH COFLEX 1 LEVEL N/A 09/15/2015   Procedure: Lumbar one-two Laminectomy with placement of coflex;  Surgeon: HKristeen Miss MD;  [facility];  Service: Neurosurgery;  Laterality: N/A;  . PARTIAL HYSTERECTOMY  1960   fibroid tumors, ovaries not removed  . tonsillectomy    . TONSILLECTOMY     Family History  Problem Relation Age of Onset  . Stroke Mother   . Stroke Father   . Hypertension Father   . Diabetes  Brother   . Breast cancer Daughter 418 . Colon cancer Neg Hx    History  Sexual Activity  . Sexual activity: No    Outpatient Encounter Prescriptions as of 12/09/2015  Medication Sig  . ACCU-CHEK AVIVA PLUS test strip CHECK SUGAR 4 TIMES DAILY DX E11.8  . albuterol (PROVENTIL HFA;VENTOLIN HFA) 108 (90 BASE) MCG/ACT inhaler Inhale 2 puffs into the lungs every 6 (six) hours as needed for wheezing or shortness of breath.  .Marland KitchenamLODipine (NORVASC) 5 MG tablet TAKE 1 TABLET (5 MG TOTAL) BY MOUTH DAILY.  .Marland Kitchenaspirin 81 MG tablet Take 81 mg by mouth daily.  . Biotin 2.5 MG CAPS Take 2.5 mg by mouth daily.  . blood glucose meter kit and supplies KIT Dispense based on patient and insurance preference. Use up to four times daily as directed. (FOR ICD-10:E11.65)  . Calcium Citrate-Vitamin D (CALCIUM + D PO) Take 1 tablet by mouth daily.  . chlorpheniramine-HYDROcodone (TUSSIONEX PENNKINETIC ER) 10-8 MG/5ML SUER Take 5 mLs by mouth every 12 (twelve) hours as needed.  . diazepam (VALIUM) 5 MG tablet Take 1 tablet (5 mg total) by mouth every 6 (six) hours as needed for muscle spasms.  .Marland Kitchen  fexofenadine (ALLEGRA) 180 MG tablet Take 180 mg by mouth daily.  . fluticasone (FLONASE) 50 MCG/ACT nasal spray Place 2 sprays into both nostrils daily.  Marland Kitchen glucagon (GLUCAGON EMERGENCY) 1 MG injection Inject 1 mg into the muscle once as needed.  Marland Kitchen glucose blood test strip Use as instructed (Patient taking differently: 1 each by Other route See admin instructions. Check blood sugar 4 times daily)  . imipramine (TOFRANIL) 25 MG tablet Take 25 mg by mouth at bedtime.  . insulin glargine (LANTUS) 100 UNIT/ML injection Inject 0.33 mLs (33 Units total) into the skin at bedtime. Pens please  - SOLOSTAR (Patient taking differently: Inject 24 Units into the skin at bedtime. Pens please  - SOLOSTAR)  . insulin lispro (HUMALOG KWIKPEN) 100 UNIT/ML KiwkPen Inject on a sliding scale between 14-28 units with each meal. Max daily dose 90  (Patient taking differently: Inject 6-11 Units into the skin 3 (three) times daily. Per sliding scale)  . Lancets (ACCU-CHEK SOFT TOUCH) lancets Use as instructed. Dx E11.8 (Patient taking differently: 1 each by Other route See admin instructions. Check blood sugar 4 times daily)  . levothyroxine (SYNTHROID, LEVOTHROID) 150 MCG tablet Take 1 tablet (150 mcg total) by mouth daily.  Marland Kitchen LORazepam (ATIVAN) 0.5 MG tablet 1/2 tablet q day prn (Patient taking differently: Take 0.25-0.5 mg by mouth daily as needed for anxiety. )  . metFORMIN (GLUCOPHAGE-XR) 500 MG 24 hr tablet TAKE 1 TABLET (500 MG TOTAL) BY MOUTH 2 (TWO) TIMES DAILY.  . Multiple Vitamins-Minerals (MULTIVITAMIN PO) Take 1 tablet by mouth daily.   . Multiple Vitamins-Minerals (PRESERVISION AREDS PO) Take 1 capsule by mouth 2 (two) times daily.  . pantoprazole (PROTONIX) 40 MG tablet TAKE 1 TABLET (40 MG TOTAL) BY MOUTH 2 (TWO) TIMES DAILY.  . traMADol (ULTRAM) 50 MG tablet Take 1 tablet (50 mg total) by mouth 4 (four) times daily as needed for moderate pain.  . valsartan-hydrochlorothiazide (DIOVAN-HCT) 160-12.5 MG tablet TAKE 1 TABLET BY MOUTH DAILY.  . [DISCONTINUED] predniSONE (DELTASONE) 50 MG tablet 1 tablet daily x 5 days.   No facility-administered encounter medications on file as of 12/09/2015.     Activities of Daily Living In your present state of health, do you have any difficulty performing the following activities: 12/09/2015 09/09/2015  Hearing? Tempie Donning  Vision? N N  Difficulty concentrating or making decisions? Y N  Walking or climbing stairs? N Y  Dressing or bathing? N N  Doing errands, shopping? N N  Preparing Food and eating ? N -  Using the Toilet? N -  In the past six months, have you accidently leaked urine? Y -  Do you have problems with loss of bowel control? N -  Managing your Medications? N -  Managing your Finances? N -  Housekeeping or managing your Housekeeping? N -  Some recent data might be hidden     Patient Care Team: Einar Pheasant, MD as PCP - General (Internal Medicine)    Assessment:    This is a routine wellness examination for Marissa Huff. The goal of the wellness visit is to assist the patient how to close the gaps in care and create a preventative care plan for the patient.   Taking calcium VIT D as appropriate/Osteoporosis risk reviewed.  Medications reviewed; taking without issues or barriers.  Safety issues reviewed; lives with husband.  Smoke detectors in the home. No firearms in the home. Wears seatbelts when driving or riding with others. No violence in the home.  No identified risk were noted; The patient was oriented x 3; appropriate in dress and manner and no objective failures at ADL's or IADL's.   BMI; discussed the importance of a healthy diet, water intake and exercise. Educational material provided.  HTN; well controlled with medication.  Stable and followed by PCP.   Pneumococcal 23 vaccine administered L deltoid, tolerated well. Health maintenance gaps; closed.  TDAP vaccine deferred for follow up with insurance.  Patient Concerns: None at this time. Follow up with PCP as needed.  Exercise Activities and Dietary recommendations Current Exercise Habits: Home exercise routine, Type of exercise: walking, Time (Minutes): 20, Frequency (Times/Week): 4, Weekly Exercise (Minutes/Week): 80, Intensity: Mild  Goals    . Increase physical activity          Stay active and continue walking the dog every other day for exercise.  Incorporate chair exercises, as demonstrated, as tolerated      Fall Risk Fall Risk  12/09/2015 10/02/2015 12/09/2014 08/15/2014 05/08/2014  Falls in the past year? No No Yes No No  Number falls in past yr: - - 1 - -  Injury with Fall? - - No - -  Risk Factor Category  - - - - -  Follow up - - Education provided;Falls prevention discussed - -   Depression Screen PHQ 2/9 Scores 12/09/2015 10/02/2015 12/09/2014 08/15/2014  PHQ - 2  Score 0 0 0 0     Cognitive Function MMSE - Mini Mental State Exam 12/09/2015 12/09/2014  Orientation to time 5 5  Orientation to Place 5 5  Registration 3 3  Attention/ Calculation 5 5  Recall 3 3  Language- name 2 objects 2 2  Language- repeat 1 1  Language- follow 3 step command 3 3  Language- read & follow direction 1 1  Write a sentence 1 1  Copy design 1 1  Total score 30 30        Immunization History  Administered Date(s) Administered  . Influenza Split 12/12/2011, 10/01/2012  . Influenza, High Dose Seasonal PF 10/02/2015  . Influenza,inj,Quad PF,36+ Mos 09/03/2013, 10/27/2014  . Pneumococcal Conjugate-13 11/11/2014  . Pneumococcal Polysaccharide-23 12/09/2015   Screening Tests Health Maintenance  Topic Date Due  . TETANUS/TDAP  03/09/1950  . OPHTHALMOLOGY EXAM  05/13/2015  . FOOT EXAM  08/15/2015  . HEMOGLOBIN A1C  05/02/2016  . MAMMOGRAM  10/21/2016  . INFLUENZA VACCINE  Completed  . DEXA SCAN  Completed  . ZOSTAVAX  Addressed  . PNA vac Low Risk Adult  Completed      Plan:    End of life planning; Advance aging; Advanced directives discussed.No HCPOA/Living Will. Additional information declined.    Medicare Attestation I have personally reviewed: The patient's medical and social history Their use of alcohol, tobacco or illicit drugs Their current medications and supplements The patient's functional ability including ADLs,fall risks, home safety risks, cognitive, and hearing and visual impairment Diet and physical activities Evidence for depression   The patient's weight, height, BMI, and visual acuity have been recorded in the chart.  I have made referrals and provided education to the patient based on review of the above and I have provided the patient with a written personalized care plan for preventive services.    During the course of the visit the patient was educated and counseled about the following appropriate screening and preventive  services:   Vaccines to include Pneumoccal, Influenza, Hepatitis B, Td, Zostavax, HCV  Electrocardiogram  Cardiovascular Disease  Colorectal cancer  screening  Bone density screening  Diabetes screening  Glaucoma screening  Mammography/PAP  Nutrition counseling   Patient Instructions (the written plan) was given to the patient.   Varney Biles, LPN  [id number]   Reviewed above information.  Agree with plan.  Dr Nicki Reaper

## 2015-12-09 NOTE — Patient Instructions (Addendum)
  Ms. Minish , Thank you for taking time to come for your Medicare Wellness Visit. I appreciate your ongoing commitment to your health goals. Please review the following plan we discussed and let me know if I can assist you in the future.   FOLLOW UP WITH DR. Nicki Reaper AS NEEDED.  These are the goals we discussed: Goals    . Increase physical activity          Stay active and continue walking the dog every other day for exercise.  Incorporate chair exercises, as demonstrated, as tolerated       This is a list of the screening recommended for you and due dates:  Health Maintenance  Topic Date Due  . Tetanus Vaccine  03/09/1950  . Eye exam for diabetics  05/13/2015  . Complete foot exam   08/15/2015  . Hemoglobin A1C  05/02/2016  . Mammogram  10/21/2016  . Flu Shot  Completed  . DEXA scan (bone density measurement)  Completed  . Shingles Vaccine  Addressed  . Pneumonia vaccines  Completed

## 2015-12-14 DIAGNOSIS — J301 Allergic rhinitis due to pollen: Secondary | ICD-10-CM | POA: Diagnosis not present

## 2015-12-15 ENCOUNTER — Other Ambulatory Visit (INDEPENDENT_AMBULATORY_CARE_PROVIDER_SITE_OTHER): Payer: PPO

## 2015-12-15 DIAGNOSIS — E039 Hypothyroidism, unspecified: Secondary | ICD-10-CM | POA: Diagnosis not present

## 2015-12-15 LAB — TSH: TSH: 0.95 u[IU]/mL (ref 0.35–4.50)

## 2015-12-16 ENCOUNTER — Encounter: Payer: Self-pay | Admitting: Internal Medicine

## 2015-12-21 DIAGNOSIS — J301 Allergic rhinitis due to pollen: Secondary | ICD-10-CM | POA: Diagnosis not present

## 2016-01-08 ENCOUNTER — Other Ambulatory Visit: Payer: Self-pay | Admitting: Internal Medicine

## 2016-01-11 ENCOUNTER — Encounter: Payer: Self-pay | Admitting: Internal Medicine

## 2016-01-11 ENCOUNTER — Ambulatory Visit (INDEPENDENT_AMBULATORY_CARE_PROVIDER_SITE_OTHER): Payer: PPO | Admitting: Internal Medicine

## 2016-01-11 VITALS — BP 124/62 | HR 69 | Temp 97.6°F | Ht 63.25 in | Wt 180.4 lb

## 2016-01-11 DIAGNOSIS — D649 Anemia, unspecified: Secondary | ICD-10-CM

## 2016-01-11 DIAGNOSIS — J301 Allergic rhinitis due to pollen: Secondary | ICD-10-CM | POA: Diagnosis not present

## 2016-01-11 DIAGNOSIS — Z0000 Encounter for general adult medical examination without abnormal findings: Secondary | ICD-10-CM

## 2016-01-11 DIAGNOSIS — K219 Gastro-esophageal reflux disease without esophagitis: Secondary | ICD-10-CM

## 2016-01-11 DIAGNOSIS — Z794 Long term (current) use of insulin: Secondary | ICD-10-CM

## 2016-01-11 DIAGNOSIS — E7800 Pure hypercholesterolemia, unspecified: Secondary | ICD-10-CM

## 2016-01-11 DIAGNOSIS — E1165 Type 2 diabetes mellitus with hyperglycemia: Secondary | ICD-10-CM

## 2016-01-11 DIAGNOSIS — IMO0001 Reserved for inherently not codable concepts without codable children: Secondary | ICD-10-CM

## 2016-01-11 DIAGNOSIS — E669 Obesity, unspecified: Secondary | ICD-10-CM

## 2016-01-11 DIAGNOSIS — R7989 Other specified abnormal findings of blood chemistry: Secondary | ICD-10-CM

## 2016-01-11 DIAGNOSIS — R946 Abnormal results of thyroid function studies: Secondary | ICD-10-CM

## 2016-01-11 DIAGNOSIS — I10 Essential (primary) hypertension: Secondary | ICD-10-CM

## 2016-01-11 DIAGNOSIS — E038 Other specified hypothyroidism: Secondary | ICD-10-CM

## 2016-01-11 MED ORDER — LORAZEPAM 0.5 MG PO TABS: 0.2500 mg | ORAL_TABLET | Freq: Every day | ORAL | 0 refills | Status: DC | PRN

## 2016-01-11 NOTE — Assessment & Plan Note (Signed)
On thyroid replacement.  Follow tsh.  

## 2016-01-11 NOTE — Assessment & Plan Note (Signed)
Followed by Dr Eddie Dibbles.  Sugars have been doing well per her report.  a1c 6.8.  Up to date with eye exams.  Follow met b and a1c.

## 2016-01-11 NOTE — Progress Notes (Signed)
Patient ID: Marissa Huff, female   DOB: 10-25-1931, 83 y.o.   MRN: [id number]   Subjective:    Patient ID: Marissa Huff, female    DOB: [birth year], 83 y.o.   MRN: [id number]  HPI  Patient with past history of diabetes, hypertension, GERD and hypothyroidism.  She comes in today to follow up on these issues as well as for a complete physical exam.  She feels good.  Symptoms have resolved from the bronchitis.  No chest pain.  Breathing stable.  No nausea or vomiting.  Bowels stable.  Sugars she reports have been doing well.  Sees Dr Eddie Dibbles.  Last a1c 6.8.  Has done well since her surgery.  Back is better.  Sees Dr Ellene Route.     Past Medical History:  Diagnosis Date  . Allergy   . Arthritis   . Carpal tunnel syndrome, bilateral   . Diabetes mellitus without complication ([place])   . Diverticulosis   . 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 Gastroparesis   . GERD (gastroesophageal reflux disease)   . Hypertension   . Hypothyroidism   . PUD (peptic ulcer disease)   . Skin cancer   . Spinal stenosis    lumbar laminectomy   Past Surgical History:  Procedure Laterality Date  . ABDOMINAL HYSTERECTOMY    . antrectomy and vagotomy  1994  . arthroscopic left knee  12/05/08  . BACK SURGERY    . carpal tunnel repai  10/10   left   . CATARACT EXTRACTION, BILATERAL    . EYE SURGERY     bilateral cataracts  . 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 LAPAROSCOPIC CHOLECYSTECTOMY  6/01  . LUMBAR LAMINECTOMY  1999  . LUMBAR LAMINECTOMY WITH COFLEX 1 LEVEL N/A 09/15/2015   Procedure: Lumbar one-two Laminectomy with placement of coflex;  Surgeon: Kristeen Miss, MD;  Location: [facility];  Service: Neurosurgery;  Laterality: N/A;  . PARTIAL HYSTERECTOMY  1960   fibroid tumors, ovaries not removed  . tonsillectomy    . TONSILLECTOMY     Family History  Problem Relation Age of Onset  . Stroke Mother   . Stroke Father   . Hypertension Father   . Diabetes Brother   . Breast cancer Daughter 51  . Colon cancer  Neg Hx    Social History   Social History  . Marital status: Married    Spouse name: N/A  . Number of children: N/A  . Years of education: N/A   Social History Main Topics  . Smoking status: Never Smoker  . Smokeless tobacco: Never Used  . Alcohol use No  . Drug use: No  . Sexual activity: No   Other Topics Concern  . None   Social History Narrative  . None    Outpatient Encounter Prescriptions as of 01/11/2016  Medication Sig  . ACCU-CHEK AVIVA PLUS test strip CHECK SUGAR 4 TIMES DAILY DX E11.8  . albuterol (PROVENTIL HFA;VENTOLIN HFA) 108 (90 BASE) MCG/ACT inhaler Inhale 2 puffs into the lungs every 6 (six) hours as needed for wheezing or shortness of breath.  Marland Kitchen amLODipine (NORVASC) 5 MG tablet TAKE 1 TABLET (5 MG TOTAL) BY MOUTH DAILY.  Marland Kitchen aspirin 81 MG tablet Take 81 mg by mouth daily.  . Biotin 2.5 MG CAPS Take 2.5 mg by mouth daily.  . blood glucose meter kit and supplies KIT Dispense based on patient and insurance preference. Use up to four times daily as directed. (FOR ICD-10:E11.65)  .  Calcium Citrate-Vitamin D (CALCIUM + D PO) Take 1 tablet by mouth daily.  . fexofenadine (ALLEGRA) 180 MG tablet Take 180 mg by mouth daily.  . fluticasone (FLONASE) 50 MCG/ACT nasal spray Place 2 sprays into both nostrils daily.  Marland Kitchen glucagon (GLUCAGON EMERGENCY) 1 MG injection Inject 1 mg into the muscle once as needed.  Marland Kitchen glucose blood test strip Use as instructed (Patient taking differently: 1 each by Other route See admin instructions. Check blood sugar 4 times daily)  . insulin glargine (LANTUS) 100 UNIT/ML injection Inject 0.33 mLs (33 Units total) into the skin at bedtime. Pens please  - SOLOSTAR (Patient taking differently: Inject 24 Units into the skin at bedtime. Pens please  - SOLOSTAR)  . insulin lispro (HUMALOG KWIKPEN) 100 UNIT/ML KiwkPen Inject on a sliding scale between 14-28 units with each meal. Max daily dose 90 (Patient taking differently: Inject 6-11 Units into the skin 3  (three) times daily. Per sliding scale)  . Lancets (ACCU-CHEK SOFT TOUCH) lancets Use as instructed. Dx E11.8 (Patient taking differently: 1 each by Other route See admin instructions. Check blood sugar 4 times daily)  . levothyroxine (SYNTHROID, LEVOTHROID) 150 MCG tablet Take 1 tablet (150 mcg total) by mouth daily.  Marland Kitchen LORazepam (ATIVAN) 0.5 MG tablet Take 0.5-1 tablets (0.25-0.5 mg total) by mouth daily as needed for anxiety.  . metFORMIN (GLUCOPHAGE-XR) 500 MG 24 hr tablet TAKE 1 TABLET (500 MG TOTAL) BY MOUTH 2 (TWO) TIMES DAILY.  . Multiple Vitamins-Minerals (MULTIVITAMIN PO) Take 1 tablet by mouth daily.   . Multiple Vitamins-Minerals (PRESERVISION AREDS PO) Take 1 capsule by mouth 2 (two) times daily.  . pantoprazole (PROTONIX) 40 MG tablet TAKE 1 TABLET (40 MG TOTAL) BY MOUTH 2 (TWO) TIMES DAILY.  . traMADol (ULTRAM) 50 MG tablet Take 1 tablet (50 mg total) by mouth 4 (four) times daily as needed for moderate pain.  . valsartan-hydrochlorothiazide (DIOVAN-HCT) 160-12.5 MG tablet TAKE 1 TABLET BY MOUTH DAILY.  . [DISCONTINUED] chlorpheniramine-HYDROcodone (TUSSIONEX PENNKINETIC ER) 10-8 MG/5ML SUER Take 5 mLs by mouth every 12 (twelve) hours as needed.  . [DISCONTINUED] diazepam (VALIUM) 5 MG tablet Take 1 tablet (5 mg total) by mouth every 6 (six) hours as needed for muscle spasms.  . [DISCONTINUED] imipramine (TOFRANIL) 25 MG tablet Take 25 mg by mouth at bedtime.  . [DISCONTINUED] LORazepam (ATIVAN) 0.5 MG tablet 1/2 tablet q day prn (Patient taking differently: Take 0.25-0.5 mg by mouth daily as needed for anxiety. )   No facility-administered encounter medications on file as of 01/11/2016.     Review of Systems  Constitutional: Negative for appetite change and unexpected weight change.  HENT: Negative for congestion and sinus pressure.   Eyes: Negative for pain and visual disturbance.  Respiratory: Negative for cough, chest tightness and shortness of breath.   Cardiovascular:  Negative for chest pain, palpitations and leg swelling.  Gastrointestinal: Negative for abdominal pain, diarrhea, nausea and vomiting.  Genitourinary: Negative for difficulty urinating and dysuria.  Musculoskeletal: Negative for joint swelling.       Back pain is much better s/p surgery.   Skin: Negative for color change and rash.  Neurological: Negative for dizziness, light-headedness and headaches.  Hematological: Negative for adenopathy. Does not bruise/bleed easily.  Psychiatric/Behavioral: Negative for agitation and dysphoric mood.       Objective:    Physical Exam  Constitutional: She is oriented to person, place, and time. She appears well-developed and well-nourished. No distress.  HENT:  Nose: Nose normal.  Mouth/Throat: Oropharynx is clear and moist.  Eyes: Right eye exhibits no discharge. Left eye exhibits no discharge. No scleral icterus.  Neck: Neck supple. No thyromegaly present.  Cardiovascular: Normal rate and regular rhythm.   Pulmonary/Chest: Breath sounds normal. No accessory muscle usage. No tachypnea. No respiratory distress. She has no decreased breath sounds. She has no wheezes. She has no rhonchi. Right breast exhibits no inverted nipple, no mass, no nipple discharge and no tenderness (no axillary adenopathy). Left breast exhibits no inverted nipple, no mass, no nipple discharge and no tenderness (no axilarry adenopathy).  Abdominal: Soft. Bowel sounds are normal. There is no tenderness.  Musculoskeletal: She exhibits no edema or tenderness.  Lymphadenopathy:    She has no cervical adenopathy.  Neurological: She is alert and oriented to person, place, and time.  Skin: Skin is warm. No rash noted. No erythema.  Psychiatric: She has a normal mood and affect. Her behavior is normal.    BP 124/62   Pulse 69   Temp 97.6 F (36.4 C) (Oral)   Ht 5' 3.25" (1.607 m)   Wt 180 lb 6.4 oz (81.8 kg)   SpO2 95%   BMI 31.70 kg/m  Wt Readings from Last 3 Encounters:    01/11/16 180 lb 6.4 oz (81.8 kg)  12/09/15 179 lb 6.4 oz (81.4 kg)  11/19/15 177 lb (80.3 kg)     Lab Results  Component Value Date   WBC 6.1 11/02/2015   HGB 11.7 (L) 11/02/2015   HCT 34.5 (L) 11/02/2015   PLT 253.0 11/02/2015   GLUCOSE 188 (H) 11/02/2015   CHOL 130 11/02/2015   TRIG 65.0 11/02/2015   HDL 55.20 11/02/2015   LDLCALC 62 11/02/2015   ALT 11 11/02/2015   AST 12 11/02/2015   NA 141 11/02/2015   K 4.1 11/02/2015   CL 105 11/02/2015   CREATININE 0.76 11/02/2015   BUN 22 11/02/2015   CO2 27 11/02/2015   TSH 0.95 12/15/2015   HGBA1C 6.8 (H) 11/02/2015   MICROALBUR 2.6 (H) 11/02/2015    Mm Screening Breast Tomo Bilateral  Result Date: 10/23/2015 CLINICAL DATA:  Screening. EXAM: 2D DIGITAL SCREENING BILATERAL MAMMOGRAM WITH CAD AND ADJUNCT TOMO COMPARISON:  Previous exam(s). ACR Breast Density Category b: There are scattered areas of fibroglandular density. FINDINGS: There are no findings suspicious for malignancy. Images were processed with CAD. IMPRESSION: No mammographic evidence of malignancy. A result letter of this screening mammogram will be mailed directly to the patient. RECOMMENDATION: Screening mammogram in one year. (Code:SM-B-01Y) BI-RADS CATEGORY  1: Negative. Electronically Signed   By: Lovey Newcomer M.D.   On: 10/23/2015 08:03       Assessment & Plan:   Problem List Items Addressed This Visit    GERD (gastroesophageal reflux disease)    Symptoms controlled on protonix.        Health care maintenance    Physical today 01/11/16.  Mammogram 10/23/15 - [facility] I.  Saw Dr Candace Cruise.  Felt no further w/up warranted for colon cancer screening.       Hypercholesterolemia    Low cholesterol diet and exercise.  Follow lipid panel.  LDL 62.        Relevant Orders   Hepatic function panel   Lipid panel   Hypertension    Blood pressure under good control.  Continue same medication regimen.  Follow pressures.  Follow metabolic panel.        Relevant Orders    Basic metabolic panel   Hypothyroidism  On thyroid replacement.  Follow tsh.       Obesity (BMI 30-39.9)    Diet and exercise.  Follow.       Type 2 diabetes mellitus, uncontrolled ([place])    Followed by Dr Eddie Dibbles.  Sugars have been doing well per her report.  a1c 6.8.  Up to date with eye exams.  Follow met b and a1c.       Relevant Orders   Hemoglobin A1c    Other Visit Diagnoses    Low TSH level    -  Primary   Relevant Orders   TSH   Anemia, unspecified type       Relevant Orders   CBC with Differential/Platelet   Ferritin       Einar Pheasant, MD

## 2016-01-11 NOTE — Assessment & Plan Note (Signed)
Diet and exercise.  Follow.  

## 2016-01-11 NOTE — Progress Notes (Signed)
Pre visit review using our clinic review tool, if applicable. No additional management support is needed unless otherwise documented below in the visit note. 

## 2016-01-11 NOTE — Assessment & Plan Note (Signed)
Physical today 01/11/16.  Mammogram 10/23/15 - Bir[facility] I.  Saw Dr Candace Cruise.  Felt no further w/up warranted for colon cancer screening.

## 2016-01-11 NOTE — Assessment & Plan Note (Signed)
Blood pressure under good control.  Continue same medication regimen.  Follow pressures.  Follow metabolic panel.   

## 2016-01-12 ENCOUNTER — Encounter: Payer: Self-pay | Admitting: Internal Medicine

## 2016-01-12 NOTE — Assessment & Plan Note (Signed)
Symptoms controlled on protonix.   

## 2016-01-12 NOTE — Assessment & Plan Note (Signed)
Low cholesterol diet and exercise.  Follow lipid panel.  LDL 62.

## 2016-01-21 ENCOUNTER — Other Ambulatory Visit: Payer: Self-pay | Admitting: Internal Medicine

## 2016-01-25 DIAGNOSIS — J301 Allergic rhinitis due to pollen: Secondary | ICD-10-CM | POA: Diagnosis not present

## 2016-01-29 ENCOUNTER — Telehealth: Payer: Self-pay | Admitting: Internal Medicine

## 2016-01-29 ENCOUNTER — Telehealth: Payer: Self-pay

## 2016-01-29 MED ORDER — ONETOUCH ULTRA SYSTEM W/DEVICE KIT: 1.0000 | PACK | Freq: Once | 0 refills | Status: AC

## 2016-01-29 MED ORDER — ACCU-CHEK SOFT TOUCH LANCETS MISC
1.0000 | 4 refills | Status: DC
Start: 2016-01-29 — End: 2018-05-15

## 2016-01-29 NOTE — Telephone Encounter (Signed)
Pharmacy called script was sent over for glucose tester but patient has one already. She needed needles for her lantus. The script for the pen was for 1 month and 4 refills. Pharmacy will d/c script for monitor and give needles with 4 refills.

## 2016-01-29 NOTE — Telephone Encounter (Signed)
Alternative Requested - Lancets (ACCU-CHEK SOFT TOUCH) lancets - Insurance no longer covers this medication need one touch pls send new diabetic supplies machine,lancets and strips.

## 2016-01-29 NOTE — Telephone Encounter (Signed)
Medication has been refilled.

## 2016-01-29 NOTE — Telephone Encounter (Signed)
RX has been sent to pharmacy.  

## 2016-01-29 NOTE — Telephone Encounter (Signed)
Pt called requesting a new rx for bd ultra fine short needle 8 mm by 31 g. Please advise, thank you!  Pharmacy - [id number] - [place], [place] [address]  Call pt @ [phone]

## 2016-02-01 DIAGNOSIS — J301 Allergic rhinitis due to pollen: Secondary | ICD-10-CM | POA: Diagnosis not present

## 2016-02-05 ENCOUNTER — Other Ambulatory Visit: Payer: Self-pay | Admitting: Internal Medicine

## 2016-02-08 DIAGNOSIS — J301 Allergic rhinitis due to pollen: Secondary | ICD-10-CM | POA: Diagnosis not present

## 2016-02-16 DIAGNOSIS — J301 Allergic rhinitis due to pollen: Secondary | ICD-10-CM | POA: Diagnosis not present

## 2016-02-22 DIAGNOSIS — J301 Allergic rhinitis due to pollen: Secondary | ICD-10-CM | POA: Diagnosis not present

## 2016-03-21 DIAGNOSIS — J301 Allergic rhinitis due to pollen: Secondary | ICD-10-CM | POA: Diagnosis not present

## 2016-03-23 DIAGNOSIS — E119 Type 2 diabetes mellitus without complications: Secondary | ICD-10-CM | POA: Diagnosis not present

## 2016-03-23 LAB — HM DIABETES EYE EXAM

## 2016-03-28 DIAGNOSIS — J301 Allergic rhinitis due to pollen: Secondary | ICD-10-CM | POA: Diagnosis not present

## 2016-04-05 DIAGNOSIS — E039 Hypothyroidism, unspecified: Secondary | ICD-10-CM | POA: Diagnosis not present

## 2016-04-05 DIAGNOSIS — J301 Allergic rhinitis due to pollen: Secondary | ICD-10-CM | POA: Diagnosis not present

## 2016-04-05 DIAGNOSIS — Z794 Long term (current) use of insulin: Secondary | ICD-10-CM | POA: Diagnosis not present

## 2016-04-05 DIAGNOSIS — E11649 Type 2 diabetes mellitus with hypoglycemia without coma: Secondary | ICD-10-CM | POA: Diagnosis not present

## 2016-04-05 DIAGNOSIS — E109 Type 1 diabetes mellitus without complications: Secondary | ICD-10-CM | POA: Diagnosis not present

## 2016-04-11 ENCOUNTER — Other Ambulatory Visit (INDEPENDENT_AMBULATORY_CARE_PROVIDER_SITE_OTHER): Payer: PPO

## 2016-04-11 DIAGNOSIS — Z794 Long term (current) use of insulin: Secondary | ICD-10-CM | POA: Diagnosis not present

## 2016-04-11 DIAGNOSIS — R946 Abnormal results of thyroid function studies: Secondary | ICD-10-CM

## 2016-04-11 DIAGNOSIS — IMO0001 Reserved for inherently not codable concepts without codable children: Secondary | ICD-10-CM

## 2016-04-11 DIAGNOSIS — E7800 Pure hypercholesterolemia, unspecified: Secondary | ICD-10-CM

## 2016-04-11 DIAGNOSIS — E1165 Type 2 diabetes mellitus with hyperglycemia: Secondary | ICD-10-CM

## 2016-04-11 DIAGNOSIS — D649 Anemia, unspecified: Secondary | ICD-10-CM

## 2016-04-11 DIAGNOSIS — J301 Allergic rhinitis due to pollen: Secondary | ICD-10-CM | POA: Diagnosis not present

## 2016-04-11 DIAGNOSIS — I10 Essential (primary) hypertension: Secondary | ICD-10-CM | POA: Diagnosis not present

## 2016-04-11 DIAGNOSIS — R7989 Other specified abnormal findings of blood chemistry: Secondary | ICD-10-CM

## 2016-04-11 LAB — LIPID PANEL
Cholesterol: 149 mg/dL (ref 0–200)
HDL: 46.3 mg/dL (ref >39.00)
LDL Cholesterol: 85 mg/dL (ref 0–99)
NonHDL: 102.47
Total CHOL/HDL Ratio: 3
Triglycerides: 85 mg/dL (ref 0.0–149.0)
VLDL: 17 mg/dL (ref 0.0–40.0)

## 2016-04-11 LAB — BASIC METABOLIC PANEL
BUN: 20 mg/dL (ref 6–23)
CO2: 30 mEq/L (ref 19–32)
Calcium: 10 mg/dL (ref 8.4–10.5)
Chloride: 103 mEq/L (ref 96–112)
Creatinine, Ser: 0.79 mg/dL (ref 0.40–1.20)
GFR: 73.86 mL/min (ref >60.00)
Glucose, Bld: 186 mg/dL — ABNORMAL HIGH (ref 70–99)
Potassium: 4.6 mEq/L (ref 3.5–5.1)
Sodium: 141 mEq/L (ref 135–145)

## 2016-04-11 LAB — HEPATIC FUNCTION PANEL
ALT: 12 U/L (ref 0–35)
AST: 13 U/L (ref 0–37)
Albumin: 3.9 g/dL (ref 3.5–5.2)
Alkaline Phosphatase: 82 U/L (ref 39–117)
Bilirubin, Direct: 0.1 mg/dL (ref 0.0–0.3)
Total Bilirubin: 0.4 mg/dL (ref 0.2–1.2)
Total Protein: 6.7 g/dL (ref 6.0–8.3)

## 2016-04-11 LAB — CBC WITH DIFFERENTIAL/PLATELET
Basophils Absolute: 0.1 10*3/uL (ref 0.0–0.1)
Basophils Relative: 1 % (ref 0.0–3.0)
Eosinophils Absolute: 0.2 10*3/uL (ref 0.0–0.7)
Eosinophils Relative: 4 % (ref 0.0–5.0)
HCT: 36.3 % (ref 36.0–46.0)
Hemoglobin: 12.3 g/dL (ref 12.0–15.0)
Lymphocytes Relative: 22.1 % (ref 12.0–46.0)
Lymphs Abs: 1.2 10*3/uL (ref 0.7–4.0)
MCHC: 33.9 g/dL (ref 30.0–36.0)
MCV: 88.7 fl (ref 78.0–100.0)
Monocytes Absolute: 0.5 10*3/uL (ref 0.1–1.0)
Monocytes Relative: 9.6 % (ref 3.0–12.0)
Neutro Abs: 3.4 10*3/uL (ref 1.4–7.7)
Neutrophils Relative %: 63.3 % (ref 43.0–77.0)
Platelets: 295 10*3/uL (ref 150.0–400.0)
RBC: 4.09 Mil/uL (ref 3.87–5.11)
RDW: 14.2 % (ref 11.5–15.5)
WBC: 5.4 10*3/uL (ref 4.0–10.5)

## 2016-04-11 LAB — HEMOGLOBIN A1C: Hgb A1c MFr Bld: 7.7 % — ABNORMAL HIGH (ref 4.6–6.5)

## 2016-04-11 LAB — TSH: TSH: 1.13 u[IU]/mL (ref 0.35–4.50)

## 2016-04-11 LAB — FERRITIN: Ferritin: 19.8 ng/mL (ref 10.0–291.0)

## 2016-04-12 ENCOUNTER — Encounter: Payer: Self-pay | Admitting: Internal Medicine

## 2016-04-13 ENCOUNTER — Ambulatory Visit (INDEPENDENT_AMBULATORY_CARE_PROVIDER_SITE_OTHER): Payer: PPO | Admitting: Internal Medicine

## 2016-04-13 ENCOUNTER — Encounter: Payer: Self-pay | Admitting: Internal Medicine

## 2016-04-13 DIAGNOSIS — Z8572 Personal history of non-Hodgkin lymphomas: Secondary | ICD-10-CM

## 2016-04-13 DIAGNOSIS — E1165 Type 2 diabetes mellitus with hyperglycemia: Secondary | ICD-10-CM

## 2016-04-13 DIAGNOSIS — E038 Other specified hypothyroidism: Secondary | ICD-10-CM

## 2016-04-13 DIAGNOSIS — K3184 Gastroparesis: Secondary | ICD-10-CM

## 2016-04-13 DIAGNOSIS — E1122 Type 2 diabetes mellitus with diabetic chronic kidney disease: Secondary | ICD-10-CM

## 2016-04-13 DIAGNOSIS — I10 Essential (primary) hypertension: Secondary | ICD-10-CM | POA: Diagnosis not present

## 2016-04-13 DIAGNOSIS — Z8579 Personal history of other malignant neoplasms of lymphoid, hematopoietic and related tissues: Secondary | ICD-10-CM

## 2016-04-13 DIAGNOSIS — E7800 Pure hypercholesterolemia, unspecified: Secondary | ICD-10-CM

## 2016-04-13 DIAGNOSIS — N182 Chronic kidney disease, stage 2 (mild): Secondary | ICD-10-CM

## 2016-04-13 DIAGNOSIS — K279 Peptic ulcer, site unspecified, unspecified as acute or chronic, without hemorrhage or perforation: Secondary | ICD-10-CM

## 2016-04-13 DIAGNOSIS — E669 Obesity, unspecified: Secondary | ICD-10-CM

## 2016-04-13 DIAGNOSIS — Z794 Long term (current) use of insulin: Secondary | ICD-10-CM | POA: Diagnosis not present

## 2016-04-13 DIAGNOSIS — M4800 Spinal stenosis, site unspecified: Secondary | ICD-10-CM | POA: Diagnosis not present

## 2016-04-13 DIAGNOSIS — IMO0001 Reserved for inherently not codable concepts without codable children: Secondary | ICD-10-CM

## 2016-04-13 LAB — HM DIABETES FOOT EXAM

## 2016-04-13 MED ORDER — LORAZEPAM 0.5 MG PO TABS: 0.2500 mg | ORAL_TABLET | Freq: Every day | ORAL | 0 refills | Status: DC | PRN

## 2016-04-13 MED ORDER — TRAMADOL HCL 50 MG PO TABS: 50.0000 mg | ORAL_TABLET | Freq: Two times a day (BID) | ORAL | 1 refills | Status: DC | PRN

## 2016-04-13 NOTE — Progress Notes (Signed)
Pre-visit discussion using our clinic review tool. No additional management support is needed unless otherwise documented below in the visit note.  

## 2016-04-13 NOTE — Progress Notes (Signed)
Patient ID: Marissa Huff, female   DOB: [birth year], 83 y.o.   MRN: [id number]   Subjective:    Patient ID: Marissa Huff, female    DOB: 1931/08/11, 83 y.o.   MRN: [id number]  HPI  Patient here for a scheduled follow up.  She reports she is doing relatively well.  Feeling better.  Seeing Dr Eddie Dibbles for her diabetes.  Sugars slightly increased on recent check.  Discussed labs.  Still with some back pain requiring tramadol.  s/p surgery.  Is doing better.  No chest pain.  Breathing stable.  No acid reflux.  No abdominal pain.  Bowels moving.     Past Medical History:  Diagnosis Date  . Allergy   . Arthritis   . Carpal tunnel syndrome, bilateral   . Diabetes mellitus without complication ([place])   . Diverticulosis   . 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 Gastroparesis   . GERD (gastroesophageal reflux disease)   . Hypertension   . Hypothyroidism   . PUD (peptic ulcer disease)   . Skin cancer   . Spinal stenosis    lumbar laminectomy   Past Surgical History:  Procedure Laterality Date  . ABDOMINAL HYSTERECTOMY    . antrectomy and vagotomy  1994  . arthroscopic left knee  12/05/08  . BACK SURGERY    . carpal tunnel repai  10/10   left   . CATARACT EXTRACTION, BILATERAL    . EYE SURGERY     bilateral cataracts  . 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 LAPAROSCOPIC CHOLECYSTECTOMY  6/01  . LUMBAR LAMINECTOMY  1999  . LUMBAR LAMINECTOMY WITH COFLEX 1 LEVEL N/A 09/15/2015   Procedure: Lumbar one-two Laminectomy with placement of coflex;  Surgeon: Kristeen Miss, MD;  Location: [facility];  Service: Neurosurgery;  Laterality: N/A;  . PARTIAL HYSTERECTOMY  1960   fibroid tumors, ovaries not removed  . tonsillectomy    . TONSILLECTOMY     Family History  Problem Relation Age of Onset  . Stroke Mother   . Stroke Father   . Hypertension Father   . Diabetes Brother   . Breast cancer Daughter 37  . Colon cancer Neg Hx    Social History   Social History  . Marital status: Married    Spouse  name: N/A  . Number of children: N/A  . Years of education: N/A   Social History Main Topics  . Smoking status: Never Smoker  . Smokeless tobacco: Never Used  . Alcohol use No  . Drug use: No  . Sexual activity: No   Other Topics Concern  . None   Social History Narrative  . None    Outpatient Encounter Prescriptions as of 04/13/2016  Medication Sig  . ACCU-CHEK AVIVA PLUS test strip CHECK SUGAR 4 TIMES DAILY DX E11.8  . albuterol (PROVENTIL HFA;VENTOLIN HFA) 108 (90 BASE) MCG/ACT inhaler Inhale 2 puffs into the lungs every 6 (six) hours as needed for wheezing or shortness of breath.  Marland Kitchen amLODipine (NORVASC) 5 MG tablet TAKE 1 TABLET (5 MG TOTAL) BY MOUTH DAILY.  Marland Kitchen aspirin 81 MG tablet Take 81 mg by mouth daily.  . Biotin 2.5 MG CAPS Take 2.5 mg by mouth daily.  . blood glucose meter kit and supplies KIT Dispense based on patient and insurance preference. Use up to four times daily as directed. (FOR ICD-10:E11.65)  . Calcium Citrate-Vitamin D (CALCIUM + D PO) Take 1 tablet by mouth daily.  . fexofenadine (ALLEGRA)  180 MG tablet Take 180 mg by mouth daily.  . fluticasone (FLONASE) 50 MCG/ACT nasal spray Place 2 sprays into both nostrils daily.  Marland Kitchen glucagon (GLUCAGON EMERGENCY) 1 MG injection Inject 1 mg into the muscle once as needed.  Marland Kitchen glucose blood test strip Use as instructed (Patient taking differently: 1 each by Other route See admin instructions. Check blood sugar 4 times daily)  . insulin glargine (LANTUS) 100 UNIT/ML injection Inject 0.33 mLs (33 Units total) into the skin at bedtime. Pens please  - SOLOSTAR (Patient taking differently: Inject 24 Units into the skin at bedtime. Pens please  - SOLOSTAR)  . insulin lispro (HUMALOG KWIKPEN) 100 UNIT/ML KiwkPen Inject on a sliding scale between 14-28 units with each meal. Max daily dose 90 (Patient taking differently: Inject 6-11 Units into the skin 3 (three) times daily. Per sliding scale)  . Lancets (ACCU-CHEK SOFT TOUCH) lancets  1 each by Other route See admin instructions. Check blood sugar 4 times daily  . levothyroxine (SYNTHROID, LEVOTHROID) 150 MCG tablet TAKE 1 TABLET (150 MCG TOTAL) BY MOUTH DAILY.  Marland Kitchen LORazepam (ATIVAN) 0.5 MG tablet Take 0.5-1 tablets (0.25-0.5 mg total) by mouth daily as needed for anxiety.  . metFORMIN (GLUCOPHAGE-XR) 500 MG 24 hr tablet TAKE 1 TABLET (500 MG TOTAL) BY MOUTH 2 (TWO) TIMES DAILY.  . Multiple Vitamins-Minerals (MULTIVITAMIN PO) Take 1 tablet by mouth daily.   . Multiple Vitamins-Minerals (PRESERVISION AREDS PO) Take 1 capsule by mouth 2 (two) times daily.  . pantoprazole (PROTONIX) 40 MG tablet TAKE 1 TABLET (40 MG TOTAL) BY MOUTH 2 (TWO) TIMES DAILY.  . traMADol (ULTRAM) 50 MG tablet Take 1 tablet (50 mg total) by mouth 2 (two) times daily as needed for moderate pain.  . valsartan-hydrochlorothiazide (DIOVAN-HCT) 160-12.5 MG tablet TAKE 1 TABLET BY MOUTH DAILY.  . [DISCONTINUED] LORazepam (ATIVAN) 0.5 MG tablet Take 0.5-1 tablets (0.25-0.5 mg total) by mouth daily as needed for anxiety.  . [DISCONTINUED] traMADol (ULTRAM) 50 MG tablet Take 1 tablet (50 mg total) by mouth 4 (four) times daily as needed for moderate pain.   No facility-administered encounter medications on file as of 04/13/2016.     Review of Systems  Constitutional: Negative for appetite change and unexpected weight change.  HENT: Negative for congestion and sinus pressure.   Respiratory: Negative for cough, chest tightness and shortness of breath.   Cardiovascular: Negative for chest pain, palpitations and leg swelling.  Gastrointestinal: Negative for abdominal pain, diarrhea, nausea and vomiting.  Genitourinary: Negative for difficulty urinating and dysuria.  Musculoskeletal: Positive for back pain. Negative for joint swelling.  Skin: Negative for color change and rash.  Neurological: Negative for dizziness, light-headedness and headaches.  Psychiatric/Behavioral: Negative for agitation and dysphoric mood.         Objective:    Physical Exam  Constitutional: She appears well-developed and well-nourished. No distress.  HENT:  Nose: Nose normal.  Mouth/Throat: Oropharynx is clear and moist.  Neck: Neck supple. No thyromegaly present.  Cardiovascular: Normal rate and regular rhythm.   Pulmonary/Chest: Breath sounds normal. No respiratory distress. She has no wheezes.  Abdominal: Soft. Bowel sounds are normal. There is no tenderness.  Musculoskeletal: She exhibits no edema or tenderness.  Lymphadenopathy:    She has no cervical adenopathy.  Skin: No rash noted. No erythema.  Psychiatric: She has a normal mood and affect. Her behavior is normal.    BP 128/64 (BP Location: Left Arm, Patient Position: Sitting, Cuff Size: Normal)   Pulse 70  Temp 98.6 F (37 C) (Oral)   Resp 14   Ht 5' 3"  (1.6 m)   Wt 184 lb 3.2 oz (83.6 kg)   SpO2 98%   BMI 32.63 kg/m  Wt Readings from Last 3 Encounters:  04/13/16 184 lb 3.2 oz (83.6 kg)  01/11/16 180 lb 6.4 oz (81.8 kg)  12/09/15 179 lb 6.4 oz (81.4 kg)     Lab Results  Component Value Date   WBC 5.4 04/11/2016   HGB 12.3 04/11/2016   HCT 36.3 04/11/2016   PLT 295.0 04/11/2016   GLUCOSE 186 (H) 04/11/2016   CHOL 149 04/11/2016   TRIG 85.0 04/11/2016   HDL 46.30 04/11/2016   LDLCALC 85 04/11/2016   ALT 12 04/11/2016   AST 13 04/11/2016   NA 141 04/11/2016   K 4.6 04/11/2016   CL 103 04/11/2016   CREATININE 0.79 04/11/2016   BUN 20 04/11/2016   CO2 30 04/11/2016   TSH 1.13 04/11/2016   HGBA1C 7.7 (H) 04/11/2016   MICROALBUR 2.6 (H) 11/02/2015    Mm Screening Breast Tomo Bilateral  Result Date: 10/23/2015 CLINICAL DATA:  Screening. EXAM: 2D DIGITAL SCREENING BILATERAL MAMMOGRAM WITH CAD AND ADJUNCT TOMO COMPARISON:  Previous exam(s). ACR Breast Density Category b: There are scattered areas of fibroglandular density. FINDINGS: There are no findings suspicious for malignancy. Images were processed with CAD. IMPRESSION: No  mammographic evidence of malignancy. A result letter of this screening mammogram will be mailed directly to the patient. RECOMMENDATION: Screening mammogram in one year. (Code:SM-B-01Y) BI-RADS CATEGORY  1: Negative. Electronically Signed   By: Lovey Newcomer M.D.   On: 10/23/2015 08:03       Assessment & Plan:   Problem List Items Addressed This Visit    CKD stage 2 due to type 2 diabetes mellitus ([place])    Stay hydrated.  Follow met b.       Gastroparesis    Followed by GI. Stable.       History of lymphoma    History of gastric lymphoma.  No upper symptoms.  1994 - diagnosed.        Hypercholesterolemia    Low cholesterol diet and exercise.  Follow lipid panel.   Lab Results  Component Value Date   CHOL 149 04/11/2016   HDL 46.30 04/11/2016   LDLCALC 85 04/11/2016   TRIG 85.0 04/11/2016   CHOLHDL 3 04/11/2016        Relevant Orders   Hepatic function panel   Lipid panel   Hypertension    Blood pressure under good control.  Continue same medication regimen.  Follow pressures.  Follow metabolic panel.        Hypothyroidism    On thyroid replacement.  Follow tsh.       Obesity (BMI 30-39.9)    Diet and exercise.  Follow.       Peptic ulcer disease    Continue on protonix.  Symptoms controlled.        Spinal stenosis    s/p surgery.  Doing better.  Tramadol prn.        Type 2 diabetes mellitus, uncontrolled ([facility])    Sees Dr Eddie Dibbles.  Recent a1c elevated.  Discussed diet and exercise.  Follow  Met b and a1c.   Lab Results  Component Value Date   HGBA1C 7.7 (H) 04/11/2016        Relevant Orders   Basic metabolic panel       Einar Pheasant, MD

## 2016-04-17 ENCOUNTER — Encounter: Payer: Self-pay | Admitting: Internal Medicine

## 2016-04-17 ENCOUNTER — Other Ambulatory Visit: Payer: Self-pay | Admitting: Internal Medicine

## 2016-04-17 NOTE — Assessment & Plan Note (Signed)
Continue on protonix.  Symptoms controlled.   

## 2016-04-17 NOTE — Assessment & Plan Note (Signed)
Followed by GI.  Stable.   

## 2016-04-17 NOTE — Assessment & Plan Note (Signed)
History of gastric lymphoma.  No upper symptoms.  1994 - diagnosed.

## 2016-04-17 NOTE — Assessment & Plan Note (Signed)
Sees Dr Eddie Dibbles.  Recent a1c elevated.  Discussed diet and exercise.  Follow  Met b and a1c.   Lab Results  Component Value Date   HGBA1C 7.7 (H) 04/11/2016

## 2016-04-17 NOTE — Assessment & Plan Note (Signed)
Blood pressure under good control.  Continue same medication regimen.  Follow pressures.  Follow metabolic panel.   

## 2016-04-17 NOTE — Assessment & Plan Note (Signed)
s/p surgery.  Doing better.  Tramadol prn.

## 2016-04-17 NOTE — Assessment & Plan Note (Signed)
Stay hydrated.  Follow met b.

## 2016-04-17 NOTE — Assessment & Plan Note (Signed)
Low cholesterol diet and exercise.  Follow lipid panel.   Lab Results  Component Value Date   CHOL 149 04/11/2016   HDL 46.30 04/11/2016   LDLCALC 85 04/11/2016   TRIG 85.0 04/11/2016   CHOLHDL 3 04/11/2016

## 2016-04-17 NOTE — Assessment & Plan Note (Signed)
Diet and exercise.  Follow.  

## 2016-04-17 NOTE — Assessment & Plan Note (Signed)
On thyroid replacement.  Follow tsh.  

## 2016-04-18 DIAGNOSIS — J301 Allergic rhinitis due to pollen: Secondary | ICD-10-CM | POA: Diagnosis not present

## 2016-04-21 DIAGNOSIS — J301 Allergic rhinitis due to pollen: Secondary | ICD-10-CM | POA: Diagnosis not present

## 2016-04-25 DIAGNOSIS — J301 Allergic rhinitis due to pollen: Secondary | ICD-10-CM | POA: Diagnosis not present

## 2016-05-02 DIAGNOSIS — J301 Allergic rhinitis due to pollen: Secondary | ICD-10-CM | POA: Diagnosis not present

## 2016-05-09 DIAGNOSIS — J301 Allergic rhinitis due to pollen: Secondary | ICD-10-CM | POA: Diagnosis not present

## 2016-05-11 ENCOUNTER — Ambulatory Visit (INDEPENDENT_AMBULATORY_CARE_PROVIDER_SITE_OTHER): Payer: PPO | Admitting: Cardiology

## 2016-05-11 ENCOUNTER — Encounter: Payer: Self-pay | Admitting: Cardiology

## 2016-05-11 VITALS — BP 122/54 | HR 73 | Ht 63.0 in | Wt 182.2 lb

## 2016-05-11 DIAGNOSIS — I779 Disorder of arteries and arterioles, unspecified: Secondary | ICD-10-CM

## 2016-05-11 DIAGNOSIS — R9431 Abnormal electrocardiogram [ECG] [EKG]: Secondary | ICD-10-CM | POA: Diagnosis not present

## 2016-05-11 DIAGNOSIS — I10 Essential (primary) hypertension: Secondary | ICD-10-CM

## 2016-05-11 DIAGNOSIS — I739 Peripheral vascular disease, unspecified: Secondary | ICD-10-CM

## 2016-05-11 NOTE — Progress Notes (Signed)
Cardiology Office Note   Date:  05/11/2016 06/03/2015 visit  ID:  Marissa Huff, DOB 05-11-1931, MRN [id number]  Referring Doctor:  Marissa Pheasant, MD   Cardiologist:   Marissa Bushy, MD   Reason for consultation:  Chief Complaint  Patient presents with  . other    12 month follow up. Patient c/o SOB when she is nervous. Meds reviewed verbally with patient.       History of Present Illness: Marissa Huff is a 83 y.o. female who presents for Follow-up for carotid artery disease   Patient seen previously for evaluation of dizziness. That has since resolved for quite some time now. No recurrence.  She continues to be active and 7 house and also works outside in the yard. No chest pain or shortness of breath. She takes the stairs multiple times a day without any difficulties except for some back pain.  No PND, orthopnea, edema. No palpitations. No chest pain or shortness breath.  ROS:  Please see the history of present illness. Aside from mentioned under HPI, all other systems are reviewed and negative.     Past Medical History:  Diagnosis Date  . Allergy   . Arthritis   . Carpal tunnel syndrome, bilateral   . Diabetes mellitus without complication ([place])   . Diverticulosis   . 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 Gastroparesis   . GERD (gastroesophageal reflux disease)   . Hypertension   . Hypothyroidism   . PUD (peptic ulcer disease)   . Skin cancer   . Spinal stenosis    lumbar laminectomy    Past Surgical History:  Procedure Laterality Date  . ABDOMINAL HYSTERECTOMY    . antrectomy and vagotomy  1994  . arthroscopic left knee  12/05/08  . BACK SURGERY    . carpal tunnel repai  10/10   left   . CATARACT EXTRACTION, BILATERAL    . EYE SURGERY     bilateral cataracts  . 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 LAPAROSCOPIC CHOLECYSTECTOMY  6/01  . LUMBAR LAMINECTOMY  1999  . LUMBAR LAMINECTOMY WITH COFLEX 1 LEVEL N/A 09/15/2015   Procedure: Lumbar one-two Laminectomy  with placement of coflex;  Surgeon: Kristeen Miss, MD;  Location: [facility];  Service: Neurosurgery;  Laterality: N/A;  . PARTIAL HYSTERECTOMY  1960   fibroid tumors, ovaries not removed  . tonsillectomy    . TONSILLECTOMY       reports that she has never smoked. She has never used smokeless tobacco. She reports that she does not drink alcohol or use drugs.   family history includes Breast cancer (age of onset: 48) in her daughter; Diabetes in her brother; Hypertension in her father; Stroke in her father and mother. Mother underwent open heart surgery for hole in the heart likely congenital, at age 55   Current Outpatient Prescriptions  Medication Sig Dispense Refill  . ACCU-CHEK AVIVA PLUS test strip CHECK SUGAR 4 TIMES DAILY DX E11.8 200 each 5  . albuterol (PROVENTIL HFA;VENTOLIN HFA) 108 (90 BASE) MCG/ACT inhaler Inhale 2 puffs into the lungs every 6 (six) hours as needed for wheezing or shortness of breath. 1 Inhaler 1  . amLODipine (NORVASC) 5 MG tablet TAKE 1 TABLET (5 MG TOTAL) BY MOUTH DAILY. 90 tablet 1  . aspirin 81 MG tablet Take 81 mg by mouth daily.    . Biotin 2.5 MG CAPS Take 2.5 mg by mouth daily.    . blood glucose meter  kit and supplies KIT Dispense based on patient and insurance preference. Use up to four times daily as directed. (FOR ICD-10:E11.65) 1 each 0  . Calcium Citrate-Vitamin D (CALCIUM + D PO) Take 1 tablet by mouth daily.    . fexofenadine (ALLEGRA) 180 MG tablet Take 180 mg by mouth daily.    . fluticasone (FLONASE) 50 MCG/ACT nasal spray Place 2 sprays into both nostrils daily. 48 g 11  . glucagon (GLUCAGON EMERGENCY) 1 MG injection Inject 1 mg into the muscle once as needed. 1 each 3  . glucose blood test strip Use as instructed (Patient taking differently: 1 each by Other route See admin instructions. Check blood sugar 4 times daily) 200 each 12  . insulin glargine (LANTUS) 100 UNIT/ML injection Inject 0.33 mLs (33 Units total) into the skin at bedtime.  Pens please  - SOLOSTAR (Patient taking differently: Inject 24 Units into the skin at bedtime. Pens please  - SOLOSTAR) 10 mL 5  . insulin lispro (HUMALOG KWIKPEN) 100 UNIT/ML KiwkPen Inject on a sliding scale between 14-28 units with each meal. Max daily dose 90 (Patient taking differently: Inject 6-11 Units into the skin 3 (three) times daily. Per sliding scale) 90 mL 3  . Lancets (ACCU-CHEK SOFT TOUCH) lancets 1 each by Other route See admin instructions. Check blood sugar 4 times daily 200 each 4  . levothyroxine (SYNTHROID, LEVOTHROID) 150 MCG tablet TAKE 1 TABLET (150 MCG TOTAL) BY MOUTH DAILY. 90 tablet 3  . LORazepam (ATIVAN) 0.5 MG tablet Take 0.5-1 tablets (0.25-0.5 mg total) by mouth daily as needed for anxiety. 30 tablet 0  . metFORMIN (GLUCOPHAGE-XR) 500 MG 24 hr tablet TAKE 1 TABLET (500 MG TOTAL) BY MOUTH 2 (TWO) TIMES DAILY. 60 tablet 5  . Multiple Vitamins-Minerals (MULTIVITAMIN PO) Take 1 tablet by mouth daily.     . pantoprazole (PROTONIX) 40 MG tablet TAKE 1 TABLET (40 MG TOTAL) BY MOUTH 2 (TWO) TIMES DAILY. 180 tablet 1  . traMADol (ULTRAM) 50 MG tablet Take 1 tablet (50 mg total) by mouth 2 (two) times daily as needed for moderate pain. 60 tablet 1  . valsartan-hydrochlorothiazide (DIOVAN-HCT) 160-12.5 MG tablet TAKE 1 TABLET BY MOUTH DAILY. 90 tablet 3   No current facility-administered medications for this visit.     Allergies: Penicillins    PHYSICAL EXAM: VS:  BP (!) 122/54 (BP Location: Left Arm, Patient Position: Sitting, Cuff Size: Normal)   Pulse 73   Ht 5\' 3"  (1.6 m)   Wt 182 lb 4 oz (82.7 kg)   BMI 32.28 kg/m  , Body mass index is 32.28 kg/m. Wt Readings from Last 3 Encounters:  05/11/16 182 lb 4 oz (82.7 kg)  04/13/16 184 lb 3.2 oz (83.6 kg)  01/11/16 180 lb 6.4 oz (81.8 kg)   PHYSICAL EXAM: VS:  BP (!) 122/54 (BP Location: Left Arm, Patient Position: Sitting, Cuff Size: Normal)   Pulse 73   Ht 5\' 3"  (1.6 m)   Wt 182 lb 4 oz (82.7 kg)   BMI 32.28  kg/m  , BMI Body mass index is 32.28 kg/m. GENERAL:  well developed, well nourished, obese, not in acute distress HEENT: normocephalic, pink conjunctivae, anicteric sclerae, no xanthelasma, normal dentition, oropharynx clear NECK:  no neck vein engorgement, JVP normal, no hepatojugular reflux, carotid upstroke brisk and symmetric, R carotid bruit, no thyromegaly, no lymphadenopathy LUNGS:  good respiratory effort, clear to auscultation bilaterally CV:  PMI not displaced, no thrills, no lifts, S1 and S2  within normal limits, no palpable S3 or S4, no murmurs, no rubs, no gallops ABD:  Soft, nontender, nondistended, normoactive bowel sounds, no abdominal aortic bruit, no hepatomegaly, no splenomegaly MS: nontender back, no kyphosis, no scoliosis, no joint deformities EXT:  2+ DP/PT pulses, no edema, no varicosities, no cyanosis, no clubbing SKIN: warm, nondiaphoretic, normal turgor, no ulcers NEUROPSYCH: alert, oriented to person, place, and time, sensory/motor grossly intact, normal mood, appropriate affect   Recent Labs: 04/11/2016: ALT 12; BUN 20; Creatinine, Ser 0.79; Hemoglobin 12.3; Platelets 295.0; Potassium 4.6; Sodium 141; TSH 1.13   Lipid Panel    Component Value Date/Time   CHOL 149 04/11/2016 0920   TRIG 85.0 04/11/2016 0920   HDL 46.30 04/11/2016 0920   CHOLHDL 3 04/11/2016 0920   VLDL 17.0 04/11/2016 0920   LDLCALC 85 04/11/2016 0920     Other studies Reviewed:  EKG:  EKG Is ordered today. The ekg from 03/29/2017as personally reviewed by me and it revealed sinus rhythm 70 BPM.   Additional studies/ records that were reviewed personally reviewed by me today include: Echo 05/18/2015: Left ventricle: The cavity size was normal. Wall thickness was  normal. Systolic function was normal. The estimated ejection  fraction was in the range of 55% to 60%. Wall motion was normal;  there were no regional wall motion abnormalities. Doppler  parameters are consistent with  abnormal left ventricular  relaxation (grade 1 diastolic dysfunction). - Aortic valve: There was trivial regurgitation.  Stress test 04/09/2015:  There was no ST segment deviation noted during stress.  No T wave inversion was noted during stress.  The study is normal.  This is a low risk study.  The left ventricular ejection fraction is normal (55-65%).   Carotid bilateral 05/18/2015: Heterogeneous plaque, bilaterally. 40-59% ICA stenosis, bilaterally. > 50% ECA stenosis, bilaterally. Normal subclavian arteries, bilaterally. Patent vertebral arteries with antegrade flow. f/u 1 year  ASSESSMENT AND PLAN:  Right carotid bruit  Carotid artery disease with mild ICA stenosis bilaterally. Otherwise asymptomatic from this standpoint Continue medical therapy aspirin 81 mg by mouth daily. Most recent LDL is 85. Patient would like to continue dietary modification versus taking a statin therapy. She has discussed this with her PCP. She is due for follow-up carotid artery ultrasound.  Hypertension BP is well controlled. Continue monitoring BP. Continue current medical therapy and lifestyle changes.  Abnormal EKG EKG today shows Q waves in V1 and V2. She has no angina or anginal equivalents. Recommend echocardiogram to reevaluate.  Obesity Body mass index is 32.28 kg/m. Recommend aggressive weight loss through diet and increased physical activity.    Current medicines are reviewed at length with the patient today.  The patient does not have concerns regarding medicines.  Labs/ tests ordered today include:  Orders Placed This Encounter  Procedures  . EKG 12-Lead  . ECHOCARDIOGRAM COMPLETE    I had a lengthy and detailed discussion with the patient regarding diagnoses, prognosis, diagnostic options, treatment options.  I counseled the patient on importance of lifestyle modification including heart healthy diet, regular physical activity.   Disposition:   FU with Cardiology  in one year   Signed, Marissa Bushy, MD  05/11/2016 2:21 PM    [employer]

## 2016-05-11 NOTE — Patient Instructions (Addendum)
Testing/Procedures: Your physician has requested that you have an echocardiogram. Echocardiography is a painless test that uses sound waves to create images of your heart. It provides your doctor with information about the size and shape of your heart and how well your heart's chambers and valves are working. This procedure takes approximately one hour. There are no restrictions for this procedure.    Follow-Up: Your physician wants you to follow-up in: 1 year. You will receive a reminder letter in the mail two months in advance. If you don't receive a letter, please call our office to schedule the follow-up appointment.  It was a pleasure seeing you today here in the office. Please do not hesitate to give [place] a call back if you have any further questions. [place], BSN    Echocardiogram An echocardiogram, or echocardiography, uses sound waves (ultrasound) to produce an image of your heart. The echocardiogram is simple, painless, obtained within a short period of time, and offers valuable information to your health care provider. The images from an echocardiogram can provide information such as:  Evidence of coronary artery disease (CAD).  Heart size.  Heart muscle function.  Heart valve function.  Aneurysm detection.  Evidence of a past heart attack.  Fluid buildup around the heart.  Heart muscle thickening.  Assess heart valve function. Tell a health care provider about:  Any allergies you have.  All medicines you are taking, including vitamins, herbs, eye drops, creams, and over-the-counter medicines.  Any problems you or family members have had with anesthetic medicines.  Any blood disorders you have.  Any surgeries you have had.  Any medical conditions you have.  Whether you are pregnant or may be pregnant. What happens before the procedure? No special preparation is needed. Eat and drink normally. What happens during the procedure?  In order  to produce an image of your heart, gel will be applied to your chest and a wand-like tool (transducer) will be moved over your chest. The gel will help transmit the sound waves from the transducer. The sound waves will harmlessly bounce off your heart to allow the heart images to be captured in real-time motion. These images will then be recorded.  You may need an IV to receive a medicine that improves the quality of the pictures. What happens after the procedure? You may return to your normal schedule including diet, activities, and medicines, unless your health care provider tells you otherwise. This information is not intended to replace advice given to you by your health care provider. Make sure you discuss any questions you have with your health care provider. Document Released: 12/18/1999 Document Revised: 08/08/2015 Document Reviewed: 08/27/2012 Elsevier Interactive Patient Education  2017 [employer].

## 2016-05-16 DIAGNOSIS — J301 Allergic rhinitis due to pollen: Secondary | ICD-10-CM | POA: Diagnosis not present

## 2016-05-23 DIAGNOSIS — J301 Allergic rhinitis due to pollen: Secondary | ICD-10-CM | POA: Diagnosis not present

## 2016-05-26 ENCOUNTER — Ambulatory Visit: Payer: PPO | Admitting: Cardiology

## 2016-05-31 DIAGNOSIS — J301 Allergic rhinitis due to pollen: Secondary | ICD-10-CM | POA: Diagnosis not present

## 2016-06-04 ENCOUNTER — Other Ambulatory Visit: Payer: Self-pay | Admitting: Internal Medicine

## 2016-06-06 ENCOUNTER — Telehealth: Payer: Self-pay | Admitting: Internal Medicine

## 2016-06-06 NOTE — Telephone Encounter (Signed)
If persistent symptoms she will need to be evaluated.  Since I am gone some this week, I am not sure if I have opening.

## 2016-06-06 NOTE — Telephone Encounter (Signed)
App made with another provider with husband tomorrow.

## 2016-06-06 NOTE — Telephone Encounter (Signed)
Pt husband called wanting to know if Dr Nicki Reaper can call something in for pt. Pt has had this before with coughing a lot with mucus. Pt has been taking Musinex. No appt avail. Please advise? Pt does not want to go to no other facility.  Call pt @ [phone]. Thank you!

## 2016-06-06 NOTE — Telephone Encounter (Signed)
Patient has had cough x 6 days. Cough productive not feeling better with otc meds. No fever. No other symptoms.  Allergies: Penicillin  Pharmacy [facility]

## 2016-06-07 ENCOUNTER — Encounter: Payer: Self-pay | Admitting: Family

## 2016-06-07 ENCOUNTER — Ambulatory Visit (INDEPENDENT_AMBULATORY_CARE_PROVIDER_SITE_OTHER): Payer: PPO | Admitting: Family

## 2016-06-07 VITALS — BP 100/50 | HR 77 | Temp 98.9°F | Ht 63.0 in | Wt 177.6 lb

## 2016-06-07 DIAGNOSIS — J40 Bronchitis, not specified as acute or chronic: Secondary | ICD-10-CM

## 2016-06-07 MED ORDER — DOXYCYCLINE HYCLATE 100 MG PO TABS: 100.0000 mg | ORAL_TABLET | Freq: Two times a day (BID) | ORAL | 0 refills | Status: DC

## 2016-06-07 MED ORDER — HYDROCODONE-HOMATROPINE 5-1.5 MG/5ML PO SYRP: 5.0000 mL | ORAL_SOLUTION | Freq: Every evening | ORAL | 0 refills | Status: DC | PRN

## 2016-06-07 NOTE — Progress Notes (Signed)
Pre visit review using our clinic review tool, if applicable. No additional management support is needed unless otherwise documented below in the visit note. 

## 2016-06-07 NOTE — Progress Notes (Signed)
Subjective:    Patient ID: Marissa Huff, female    DOB: 08/17/1931, 83 y.o.   MRN: [id number]  CC: Marissa Huff is a 83 y.o. female who presents today for an acute visit.    HPI: CC: productive cough x 3 days, unchanged. No sob, wheezing, sinus pain, ear pain. Reports tmax 63F. Cough worse at night .  Tried robitussin with some relief. Has been using mucinex and it's 'breaking up congestion.'   Has used an inhaler in the past  H/o lymphoma- in remission.    Nonsmoker     11/2015 treated with hycodan, and prednisone for bronchitis.    HISTORY:  Past Medical History:  Diagnosis Date  . Allergy   . Arthritis   . Carpal tunnel syndrome, bilateral   . Diabetes mellitus without complication ([place])   . Diverticulosis   . 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 Gastroparesis   . GERD (gastroesophageal reflux disease)   . Hypertension   . Hypothyroidism   . PUD (peptic ulcer disease)   . Skin cancer   . Spinal stenosis    lumbar laminectomy   Past Surgical History:  Procedure Laterality Date  . ABDOMINAL HYSTERECTOMY    . antrectomy and vagotomy  1994  . arthroscopic left knee  12/05/08  . BACK SURGERY    . carpal tunnel repai  10/10   left   . CATARACT EXTRACTION, BILATERAL    . EYE SURGERY     bilateral cataracts  . 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 LAPAROSCOPIC CHOLECYSTECTOMY  6/01  . LUMBAR LAMINECTOMY  1999  . LUMBAR LAMINECTOMY WITH COFLEX 1 LEVEL N/A 09/15/2015   Procedure: Lumbar one-two Laminectomy with placement of coflex;  Surgeon: Kristeen Miss, MD;  Location: [facility];  Service: Neurosurgery;  Laterality: N/A;  . PARTIAL HYSTERECTOMY  1960   fibroid tumors, ovaries not removed  . tonsillectomy    . TONSILLECTOMY     Family History  Problem Relation Age of Onset  . Stroke Mother   . Stroke Father   . Hypertension Father   . Diabetes Brother   . Breast cancer Daughter 70  . Colon cancer Neg Hx     Allergies: Penicillins Current Outpatient  Prescriptions on File Prior to Visit  Medication Sig Dispense Refill  . ACCU-CHEK AVIVA PLUS test strip CHECK SUGAR 4 TIMES DAILY DX E11.8 200 each 5  . albuterol (PROVENTIL HFA;VENTOLIN HFA) 108 (90 BASE) MCG/ACT inhaler Inhale 2 puffs into the lungs every 6 (six) hours as needed for wheezing or shortness of breath. 1 Inhaler 1  . amLODipine (NORVASC) 5 MG tablet TAKE 1 TABLET (5 MG TOTAL) BY MOUTH DAILY. 90 tablet 1  . aspirin 81 MG tablet Take 81 mg by mouth daily.    . Biotin 2.5 MG CAPS Take 2.5 mg by mouth daily.    . blood glucose meter kit and supplies KIT Dispense based on patient and insurance preference. Use up to four times daily as directed. (FOR ICD-10:E11.65) 1 each 0  . Calcium Citrate-Vitamin D (CALCIUM + D PO) Take 1 tablet by mouth daily.    . fexofenadine (ALLEGRA) 180 MG tablet Take 180 mg by mouth daily.    . fluticasone (FLONASE) 50 MCG/ACT nasal spray Place 2 sprays into both nostrils daily. 48 g 11  . glucagon (GLUCAGON EMERGENCY) 1 MG injection Inject 1 mg into the muscle once as needed. 1 each 3  . glucose blood test strip  Use as instructed (Patient taking differently: 1 each by Other route See admin instructions. Check blood sugar 4 times daily) 200 each 12  . insulin glargine (LANTUS) 100 UNIT/ML injection Inject 0.33 mLs (33 Units total) into the skin at bedtime. Pens please  - SOLOSTAR (Patient taking differently: Inject 24 Units into the skin at bedtime. Pens please  - SOLOSTAR) 10 mL 5  . insulin lispro (HUMALOG KWIKPEN) 100 UNIT/ML KiwkPen Inject on a sliding scale between 14-28 units with each meal. Max daily dose 90 (Patient taking differently: Inject 6-11 Units into the skin 3 (three) times daily. Per sliding scale) 90 mL 3  . Lancets (ACCU-CHEK SOFT TOUCH) lancets 1 each by Other route See admin instructions. Check blood sugar 4 times daily 200 each 4  . levothyroxine (SYNTHROID, LEVOTHROID) 150 MCG tablet TAKE 1 TABLET (150 MCG TOTAL) BY MOUTH DAILY. 90 tablet 3   . LORazepam (ATIVAN) 0.5 MG tablet Take 0.5-1 tablets (0.25-0.5 mg total) by mouth daily as needed for anxiety. 30 tablet 0  . metFORMIN (GLUCOPHAGE-XR) 500 MG 24 hr tablet TAKE 1 TABLET (500 MG TOTAL) BY MOUTH 2 (TWO) TIMES DAILY. 60 tablet 5  . Multiple Vitamins-Minerals (MULTIVITAMIN PO) Take 1 tablet by mouth daily.     . pantoprazole (PROTONIX) 40 MG tablet TAKE 1 TABLET (40 MG TOTAL) BY MOUTH 2 (TWO) TIMES DAILY. 180 tablet 1  . traMADol (ULTRAM) 50 MG tablet Take 1 tablet (50 mg total) by mouth 2 (two) times daily as needed for moderate pain. 60 tablet 1  . valsartan-hydrochlorothiazide (DIOVAN-HCT) 160-12.5 MG tablet TAKE 1 TABLET BY MOUTH DAILY. 90 tablet 3   No current facility-administered medications on file prior to visit.     Social History  Substance Use Topics  . Smoking status: Never Smoker  . Smokeless tobacco: Never Used  . Alcohol use No    Review of Systems  Constitutional: Negative for chills and fever.  HENT: Positive for congestion. Negative for sinus pressure and sore throat.   Respiratory: Positive for cough. Negative for shortness of breath and wheezing.   Cardiovascular: Negative for chest pain and palpitations.  Gastrointestinal: Negative for nausea and vomiting.      Objective:    BP (!) 100/52   Pulse 77   Temp 98.9 F (37.2 C) (Oral)   Ht 5' 3"  (1.6 m)   Wt 177 lb 9.6 oz (80.6 kg)   SpO2 97%   BMI 31.46 kg/m    Physical Exam  Constitutional: She appears well-developed and well-nourished.  HENT:  Head: Normocephalic and atraumatic.  Right Ear: Hearing, tympanic membrane, external ear and ear canal normal. No drainage, swelling or tenderness. No foreign bodies. Tympanic membrane is not erythematous and not bulging. No middle ear effusion. No decreased hearing is noted.  Left Ear: Hearing, tympanic membrane, external ear and ear canal normal. No drainage, swelling or tenderness. No foreign bodies. Tympanic membrane is not erythematous and not  bulging.  No middle ear effusion. No decreased hearing is noted.  Nose: Nose normal. No rhinorrhea. Right sinus exhibits no maxillary sinus tenderness and no frontal sinus tenderness. Left sinus exhibits no maxillary sinus tenderness and no frontal sinus tenderness.  Mouth/Throat: Uvula is midline, oropharynx is clear and moist and mucous membranes are normal. No oropharyngeal exudate, posterior oropharyngeal edema, posterior oropharyngeal erythema or tonsillar abscesses.  Eyes: Conjunctivae are normal.  Cardiovascular: Regular rhythm, normal heart sounds and normal pulses.   Pulmonary/Chest: Effort normal and breath sounds normal. She has  no wheezes. She has no rhonchi. She has no rales.  Lymphadenopathy:       Head (right side): No submental, no submandibular, no tonsillar, no preauricular, no posterior auricular and no occipital adenopathy present.       Head (left side): No submental, no submandibular, no tonsillar, no preauricular, no posterior auricular and no occipital adenopathy present.    She has no cervical adenopathy.  Neurological: She is alert.  Skin: Skin is warm and dry.  Psychiatric: She has a normal mood and affect. Her speech is normal and behavior is normal. Thought content normal.  Vitals reviewed.      Assessment & Plan:   1. Bronchitis Working diagnosis of viral URI x 3 days. She is well appearing. No acute respiratory distress. Afebrile. Patient and I agreed upon conservative therapy at this time with symptom management, close observation, and delayed antibiotic treatment ( given rx however advised to wait 1-2 days). Return precautions given.   - HYDROcodone-homatropine (HYCODAN) 5-1.5 MG/5ML syrup; Take 5 mLs by mouth at bedtime as needed for cough.  Dispense: 30 mL; Refill: 0 - doxycycline (VIBRA-TABS) 100 MG tablet; Take 1 tablet (100 mg total) by mouth 2 (two) times daily.  Dispense: 10 tablet; Refill: 0    I am having Ms. Weber maintain her Multiple  Vitamins-Minerals (MULTIVITAMIN PO), fexofenadine, albuterol, insulin lispro, insulin glargine, ACCU-CHEK AVIVA PLUS, blood glucose meter kit and supplies, aspirin, fluticasone, glucagon, glucose blood, Calcium Citrate-Vitamin D (CALCIUM + D PO), Biotin, amLODipine, levothyroxine, accu-chek soft touch, metFORMIN, traMADol, LORazepam, pantoprazole, and valsartan-hydrochlorothiazide.   No orders of the defined types were placed in this encounter.   Return precautions given.   Risks, benefits, and alternatives of the medications and treatment plan prescribed today were discussed, and patient expressed understanding.   Education regarding symptom management and diagnosis given to patient on AVS.  Continue to follow with Einar Pheasant, MD for routine health maintenance.   [name] and I agreed with plan.   Mable Paris, FNP

## 2016-06-07 NOTE — Patient Instructions (Signed)
Stay on mucinex- plenty of water  Use albuterol every 6 hours for first 24 hours to get good medication into the lungs and loosen congestion; after, you may use as needed and eventually stop all together when cough resolves.  I suspect that your infection is viral in nature.  As discussed, I advise that you wait to fill the antibiotic after 1-2 days of symptom management to see if your symptoms improve. If you do not show improvement, you may take the antibiotic as prescribed.  Ensure to take probiotics while on antibiotics and also for 2 weeks after completion. It is important to re-colonize the gut with good bacteria and also to prevent any diarrheal infections associated with antibiotic use.    Let me know if no improvement

## 2016-06-10 ENCOUNTER — Telehealth: Payer: Self-pay | Admitting: *Deleted

## 2016-06-10 NOTE — Telephone Encounter (Signed)
Husband reported that pt continues with a cough and continues to loose sleep. He's requesting a more sufficient cough medication. He also feels as if the antibiotic is not helping .  [facility] [place]

## 2016-06-10 NOTE — Telephone Encounter (Signed)
Informed husband he did not have any questions.

## 2016-06-10 NOTE — Telephone Encounter (Signed)
Called patient she was not able to come to the phone she did not have hearing aids in. Spoke to husband. States she is taking the following medications she is not any better. Cough is productive being everything from clear to green. She is having rib pain and some wheezing. She was seen by Joycelyn Schmid on 06/07/16.   Hycodan only at night  Albuterol only taking PRN has only had one time today Doxycycline as directed  mucinex as directed during day  Tussin during day.

## 2016-06-10 NOTE — Telephone Encounter (Signed)
If she is not any better and cough clear to green - symptoms progressing, she needs to be evaluated.  Since is Friday pm, would recommend acute care and then I can f/u after.

## 2016-06-13 ENCOUNTER — Ambulatory Visit
Admission: RE | Admit: 2016-06-13 | Discharge: 2016-06-13 | Disposition: A | Discharge status: Home / Self Care | Payer: PPO | Source: Ambulatory Visit | Attending: Internal Medicine | Admitting: Internal Medicine

## 2016-06-13 ENCOUNTER — Ambulatory Visit
Admission: RE | Admit: 2016-06-13 | Discharge: 2016-06-13 | Disposition: A | Discharge status: Home / Self Care | Payer: PPO | Source: Ambulatory Visit | Attending: Otolaryngology | Admitting: Otolaryngology

## 2016-06-13 ENCOUNTER — Other Ambulatory Visit: Payer: Self-pay | Admitting: Otolaryngology

## 2016-06-13 DIAGNOSIS — J209 Acute bronchitis, unspecified: Secondary | ICD-10-CM | POA: Insufficient documentation

## 2016-06-13 DIAGNOSIS — R918 Other nonspecific abnormal finding of lung field: Secondary | ICD-10-CM | POA: Insufficient documentation

## 2016-06-13 DIAGNOSIS — R05 Cough: Secondary | ICD-10-CM | POA: Diagnosis not present

## 2016-06-16 ENCOUNTER — Other Ambulatory Visit: Payer: PPO

## 2016-06-20 DIAGNOSIS — J301 Allergic rhinitis due to pollen: Secondary | ICD-10-CM | POA: Diagnosis not present

## 2016-06-25 ENCOUNTER — Other Ambulatory Visit: Payer: Self-pay | Admitting: Internal Medicine

## 2016-06-27 DIAGNOSIS — J301 Allergic rhinitis due to pollen: Secondary | ICD-10-CM | POA: Diagnosis not present

## 2016-07-05 DIAGNOSIS — H60549 Acute eczematoid otitis externa, unspecified ear: Secondary | ICD-10-CM | POA: Diagnosis not present

## 2016-07-05 DIAGNOSIS — R0982 Postnasal drip: Secondary | ICD-10-CM | POA: Diagnosis not present

## 2016-07-05 DIAGNOSIS — J301 Allergic rhinitis due to pollen: Secondary | ICD-10-CM | POA: Diagnosis not present

## 2016-07-05 DIAGNOSIS — H903 Sensorineural hearing loss, bilateral: Secondary | ICD-10-CM | POA: Diagnosis not present

## 2016-07-05 DIAGNOSIS — R05 Cough: Secondary | ICD-10-CM | POA: Diagnosis not present

## 2016-07-11 DIAGNOSIS — J301 Allergic rhinitis due to pollen: Secondary | ICD-10-CM | POA: Diagnosis not present

## 2016-07-18 DIAGNOSIS — J301 Allergic rhinitis due to pollen: Secondary | ICD-10-CM | POA: Diagnosis not present

## 2016-07-25 ENCOUNTER — Other Ambulatory Visit: Payer: Self-pay | Admitting: Otolaryngology

## 2016-07-25 ENCOUNTER — Ambulatory Visit
Admission: RE | Admit: 2016-07-25 | Discharge: 2016-07-25 | Disposition: A | Discharge status: Home / Self Care | Payer: PPO | Source: Ambulatory Visit | Attending: Otolaryngology | Admitting: Otolaryngology

## 2016-07-25 DIAGNOSIS — I700 Atherosclerosis of aorta: Secondary | ICD-10-CM | POA: Insufficient documentation

## 2016-07-25 DIAGNOSIS — J209 Acute bronchitis, unspecified: Secondary | ICD-10-CM

## 2016-07-25 DIAGNOSIS — J301 Allergic rhinitis due to pollen: Secondary | ICD-10-CM | POA: Diagnosis not present

## 2016-07-25 DIAGNOSIS — R05 Cough: Secondary | ICD-10-CM | POA: Diagnosis not present

## 2016-07-26 DIAGNOSIS — I878 Other specified disorders of veins: Secondary | ICD-10-CM | POA: Diagnosis not present

## 2016-07-26 DIAGNOSIS — R3129 Other microscopic hematuria: Secondary | ICD-10-CM | POA: Diagnosis not present

## 2016-07-26 DIAGNOSIS — Q6211 Congenital occlusion of ureteropelvic junction: Secondary | ICD-10-CM | POA: Diagnosis not present

## 2016-07-26 DIAGNOSIS — N3946 Mixed incontinence: Secondary | ICD-10-CM | POA: Diagnosis not present

## 2016-07-26 DIAGNOSIS — N200 Calculus of kidney: Secondary | ICD-10-CM | POA: Diagnosis not present

## 2016-07-26 DIAGNOSIS — Q6239 Other obstructive defects of renal pelvis and ureter: Secondary | ICD-10-CM | POA: Insufficient documentation

## 2016-07-26 DIAGNOSIS — N3020 Other chronic cystitis without hematuria: Secondary | ICD-10-CM | POA: Diagnosis not present

## 2016-07-28 ENCOUNTER — Ambulatory Visit: Payer: PPO

## 2016-07-28 ENCOUNTER — Ambulatory Visit (INDEPENDENT_AMBULATORY_CARE_PROVIDER_SITE_OTHER): Payer: PPO

## 2016-07-28 ENCOUNTER — Other Ambulatory Visit: Payer: Self-pay

## 2016-07-28 DIAGNOSIS — I779 Disorder of arteries and arterioles, unspecified: Secondary | ICD-10-CM

## 2016-07-28 DIAGNOSIS — R9431 Abnormal electrocardiogram [ECG] [EKG]: Secondary | ICD-10-CM

## 2016-07-28 DIAGNOSIS — R0989 Other specified symptoms and signs involving the circulatory and respiratory systems: Secondary | ICD-10-CM | POA: Diagnosis not present

## 2016-07-28 DIAGNOSIS — I739 Peripheral vascular disease, unspecified: Principal | ICD-10-CM

## 2016-07-29 LAB — VAS US CAROTID
LEFT ECA DIAS: -16 cm/s
LEFT VERTEBRAL DIAS: 12 cm/s
Left CCA dist dias: -22 cm/s
Left CCA dist sys: -132 cm/s
Left CCA prox dias: 19 cm/s
Left CCA prox sys: 184 cm/s
Left ICA dist dias: -27 cm/s
Left ICA dist sys: -116 cm/s
Left ICA prox dias: -6 cm/s
Left ICA prox sys: -174 cm/s
RIGHT ECA DIAS: 37 cm/s
RIGHT VERTEBRAL DIAS: 12 cm/s
Right CCA prox dias: -9 cm/s
Right CCA prox sys: -59 cm/s
Right cca dist sys: -142 cm/s

## 2016-07-31 ENCOUNTER — Other Ambulatory Visit: Payer: Self-pay | Admitting: Internal Medicine

## 2016-08-01 DIAGNOSIS — J301 Allergic rhinitis due to pollen: Secondary | ICD-10-CM | POA: Diagnosis not present

## 2016-08-08 ENCOUNTER — Other Ambulatory Visit: Payer: Self-pay | Admitting: Internal Medicine

## 2016-08-08 DIAGNOSIS — Z760 Encounter for issue of repeat prescription: Secondary | ICD-10-CM

## 2016-08-09 ENCOUNTER — Other Ambulatory Visit: Payer: Self-pay

## 2016-08-09 DIAGNOSIS — Z760 Encounter for issue of repeat prescription: Secondary | ICD-10-CM

## 2016-08-09 DIAGNOSIS — J301 Allergic rhinitis due to pollen: Secondary | ICD-10-CM | POA: Diagnosis not present

## 2016-08-09 MED ORDER — GLUCOSE BLOOD VI STRP: 1.0000 | ORAL_STRIP | 12 refills | Status: DC

## 2016-08-15 DIAGNOSIS — J301 Allergic rhinitis due to pollen: Secondary | ICD-10-CM | POA: Diagnosis not present

## 2016-08-22 ENCOUNTER — Ambulatory Visit (INDEPENDENT_AMBULATORY_CARE_PROVIDER_SITE_OTHER): Payer: PPO | Admitting: Family Medicine

## 2016-08-22 ENCOUNTER — Other Ambulatory Visit (INDEPENDENT_AMBULATORY_CARE_PROVIDER_SITE_OTHER): Payer: PPO

## 2016-08-22 ENCOUNTER — Encounter: Payer: Self-pay | Admitting: Family Medicine

## 2016-08-22 DIAGNOSIS — R300 Dysuria: Secondary | ICD-10-CM

## 2016-08-22 DIAGNOSIS — E7800 Pure hypercholesterolemia, unspecified: Secondary | ICD-10-CM | POA: Diagnosis not present

## 2016-08-22 DIAGNOSIS — N390 Urinary tract infection, site not specified: Secondary | ICD-10-CM | POA: Diagnosis not present

## 2016-08-22 DIAGNOSIS — Z794 Long term (current) use of insulin: Secondary | ICD-10-CM | POA: Diagnosis not present

## 2016-08-22 DIAGNOSIS — E1165 Type 2 diabetes mellitus with hyperglycemia: Secondary | ICD-10-CM

## 2016-08-22 DIAGNOSIS — IMO0001 Reserved for inherently not codable concepts without codable children: Secondary | ICD-10-CM

## 2016-08-22 LAB — HEPATIC FUNCTION PANEL
ALT: 12 U/L (ref 0–35)
AST: 14 U/L (ref 0–37)
Albumin: 3.7 g/dL (ref 3.5–5.2)
Alkaline Phosphatase: 71 U/L (ref 39–117)
Bilirubin, Direct: 0.2 mg/dL (ref 0.0–0.3)
Total Bilirubin: 0.6 mg/dL (ref 0.2–1.2)
Total Protein: 7.1 g/dL (ref 6.0–8.3)

## 2016-08-22 LAB — LIPID PANEL
Cholesterol: 137 mg/dL (ref 0–200)
HDL: 49.6 mg/dL (ref >39.00)
LDL Cholesterol: 75 mg/dL (ref 0–99)
NonHDL: 86.93
Total CHOL/HDL Ratio: 3
Triglycerides: 58 mg/dL (ref 0.0–149.0)
VLDL: 11.6 mg/dL (ref 0.0–40.0)

## 2016-08-22 LAB — BASIC METABOLIC PANEL
BUN: 18 mg/dL (ref 6–23)
CO2: 26 mEq/L (ref 19–32)
Calcium: 9.5 mg/dL (ref 8.4–10.5)
Chloride: 101 mEq/L (ref 96–112)
Creatinine, Ser: 0.78 mg/dL (ref 0.40–1.20)
GFR: 74.88 mL/min (ref >60.00)
Glucose, Bld: 272 mg/dL — ABNORMAL HIGH (ref 70–99)
Potassium: 4 mEq/L (ref 3.5–5.1)
Sodium: 137 mEq/L (ref 135–145)

## 2016-08-22 LAB — POCT URINALYSIS DIPSTICK
Bilirubin, UA: NEGATIVE
Glucose, UA: 250
Ketones, UA: 15
Nitrite, UA: POSITIVE
Protein, UA: 300
Spec Grav, UA: 1.02 (ref 1.010–1.025)
Urobilinogen, UA: 0.2 E.U./dL
pH, UA: 7 (ref 5.0–8.0)

## 2016-08-22 MED ORDER — NITROFURANTOIN MONOHYD MACRO 100 MG PO CAPS: 100.0000 mg | ORAL_CAPSULE | Freq: Two times a day (BID) | ORAL | 0 refills | Status: DC

## 2016-08-22 NOTE — Progress Notes (Signed)
   Subjective:  Patient ID: Marissa Huff, female    DOB: January 10, 1931  Age: 83 y.o. MRN: [id number]  CC: UTI  HPI:  83 year old female with a history of frequent UTI presents with complaints of dysuria.  Reports that she developed symptoms yesterday. She has been experiencing severe dysuria, and frequency. Associated low back pain. No associated fever. No flank pain or chills. No known exacerbating or relieving factors. No other associated symptoms. No other complaints or concerns at this time.  Social Hx   Social History   Social History  . Marital status: Married    Spouse name: N/A  . Number of children: N/A  . Years of education: N/A   Social History Main Topics  . Smoking status: Never Smoker  . Smokeless tobacco: Never Used  . Alcohol use No  . Drug use: No  . Sexual activity: No   Other Topics Concern  . None   Social History Narrative  . None    Review of Systems  Constitutional: Negative for fever.  Genitourinary: Positive for dysuria and frequency.  Musculoskeletal: Positive for back pain.   Objective:  BP 130/60 (BP Location: Left Arm, Patient Position: Sitting, Cuff Size: Normal)   Pulse 70   Temp 98.2 F (36.8 C) (Oral)   Resp 16   Wt 183 lb 4 oz (83.1 kg)   SpO2 98%   BMI 32.46 kg/m   BP/Weight 08/22/2016 0/01/7791 9/0/3009  Systolic BP 233 007 622  Diastolic BP 60 50 54  Wt. (Lbs) 183.25 177.6 182.25  BMI 32.46 31.46 32.28   Physical Exam  Constitutional: She is oriented to person, place, and time. She appears well-developed. No distress.  Cardiovascular: Normal rate and regular rhythm.   Pulmonary/Chest: Effort normal. She has no wheezes. She has no rales.  Abdominal: Soft. She exhibits no distension.  Suprapubic tenderness.  Neurological: She is alert and oriented to person, place, and time.  Psychiatric: She has a normal mood and affect.  Vitals reviewed.   Lab Results  Component Value Date   WBC 5.4 04/11/2016   HGB 12.3 04/11/2016    HCT 36.3 04/11/2016   PLT 295.0 04/11/2016   GLUCOSE 272 (H) 08/22/2016   CHOL 137 08/22/2016   TRIG 58.0 08/22/2016   HDL 49.60 08/22/2016   LDLCALC 75 08/22/2016   ALT 12 08/22/2016   AST 14 08/22/2016   NA 137 08/22/2016   K 4.0 08/22/2016   CL 101 08/22/2016   CREATININE 0.78 08/22/2016   BUN 18 08/22/2016   CO2 26 08/22/2016   TSH 1.13 04/11/2016   HGBA1C 7.7 (H) 04/11/2016   MICROALBUR 2.6 (H) 11/02/2015    Assessment & Plan:   Problem List Items Addressed This Visit    Frequent UTI    Patient was symptoms of UTI. Urine abnormal. Sending culture. Treating empirically with Macrobid (allergy to PCN; Creatinine clearance okay).       Relevant Medications   nitrofurantoin, macrocrystal-monohydrate, (MACROBID) 100 MG capsule      Meds ordered this encounter  Medications  . nitrofurantoin, macrocrystal-monohydrate, (MACROBID) 100 MG capsule    Sig: Take 1 capsule (100 mg total) by mouth 2 (two) times daily.    Dispense:  14 capsule    Refill:  0   Follow-up: PRN  [place]

## 2016-08-22 NOTE — Addendum Note (Signed)
Addended by: Nanci Pina on: 08/22/2016 09:58 AM   Modules accepted: Orders

## 2016-08-22 NOTE — Patient Instructions (Addendum)
Use the antibiotic as prescribed.  Take care  Dr. Lacinda Axon

## 2016-08-22 NOTE — Assessment & Plan Note (Signed)
Patient was symptoms of UTI. Urine abnormal. Sending culture. Treating empirically with Macrobid (allergy to PCN; Creatinine clearance okay).

## 2016-08-25 ENCOUNTER — Encounter: Payer: Self-pay | Admitting: Internal Medicine

## 2016-08-25 ENCOUNTER — Ambulatory Visit (INDEPENDENT_AMBULATORY_CARE_PROVIDER_SITE_OTHER): Payer: PPO | Admitting: Internal Medicine

## 2016-08-25 DIAGNOSIS — E038 Other specified hypothyroidism: Secondary | ICD-10-CM

## 2016-08-25 DIAGNOSIS — I10 Essential (primary) hypertension: Secondary | ICD-10-CM | POA: Diagnosis not present

## 2016-08-25 DIAGNOSIS — E7800 Pure hypercholesterolemia, unspecified: Secondary | ICD-10-CM

## 2016-08-25 DIAGNOSIS — N200 Calculus of kidney: Secondary | ICD-10-CM | POA: Diagnosis not present

## 2016-08-25 DIAGNOSIS — IMO0001 Reserved for inherently not codable concepts without codable children: Secondary | ICD-10-CM

## 2016-08-25 DIAGNOSIS — M4800 Spinal stenosis, site unspecified: Secondary | ICD-10-CM

## 2016-08-25 DIAGNOSIS — E669 Obesity, unspecified: Secondary | ICD-10-CM

## 2016-08-25 DIAGNOSIS — E1165 Type 2 diabetes mellitus with hyperglycemia: Secondary | ICD-10-CM

## 2016-08-25 DIAGNOSIS — K219 Gastro-esophageal reflux disease without esophagitis: Secondary | ICD-10-CM

## 2016-08-25 DIAGNOSIS — Z794 Long term (current) use of insulin: Secondary | ICD-10-CM

## 2016-08-25 MED ORDER — FLUTICASONE PROPIONATE 50 MCG/ACT NA SUSP: 2.0000 | Freq: Every day | NASAL | 3 refills | Status: DC

## 2016-08-25 MED ORDER — TRAMADOL HCL 50 MG PO TABS: 50.0000 mg | ORAL_TABLET | Freq: Two times a day (BID) | ORAL | 1 refills | Status: DC | PRN

## 2016-08-25 MED ORDER — PANTOPRAZOLE SODIUM 40 MG PO TBEC: DELAYED_RELEASE_TABLET | ORAL | 1 refills | Status: DC

## 2016-08-25 MED ORDER — LORAZEPAM 0.5 MG PO TABS: 0.2500 mg | ORAL_TABLET | Freq: Every day | ORAL | 0 refills | Status: DC | PRN

## 2016-08-25 NOTE — Progress Notes (Signed)
Pre-visit discussion using our clinic review tool. No additional management support is needed unless otherwise documented below in the visit note.  

## 2016-08-25 NOTE — Progress Notes (Signed)
Patient ID: Marissa Huff, female   DOB: 01/24/1931, 83 y.o.   MRN: [id number]   Subjective:    Patient ID: Marissa Huff, female    DOB: 1931-01-09, 83 y.o.   MRN: [id number]  HPI  Patient here for a scheduled follow up.  She has been doing relatively well.  Breathing stable.  Sees Dr Kathyrn Sheriff.  Recently evaluated for bronchitis.  Is better.  Using flonase.  Helps.  No chest pain.  No acid reflux.  No abdominal pain.  Bowels moving.  States am sugars averaging 120s.  PM sugars averaging 160-170s.  States have been running higher recently.  Eating more sweets.     Past Medical History:  Diagnosis Date  . Allergy   . Arthritis   . Carpal tunnel syndrome, bilateral   . Diabetes mellitus without complication ([facility])   . Diverticulosis   . 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 Gastroparesis   . GERD (gastroesophageal reflux disease)   . Hypertension   . Hypothyroidism   . PUD (peptic ulcer disease)   . Skin cancer   . Spinal stenosis    lumbar laminectomy   Past Surgical History:  Procedure Laterality Date  . ABDOMINAL HYSTERECTOMY    . antrectomy and vagotomy  1994  . arthroscopic left knee  12/05/08  . BACK SURGERY    . carpal tunnel repai  10/10   left   . CATARACT EXTRACTION, BILATERAL    . EYE SURGERY     bilateral cataracts  . 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 LAPAROSCOPIC CHOLECYSTECTOMY  6/01  . LUMBAR LAMINECTOMY  1999  . LUMBAR LAMINECTOMY WITH COFLEX 1 LEVEL N/A 09/15/2015   Procedure: Lumbar one-two Laminectomy with placement of coflex;  Surgeon: Kristeen Miss, MD;  Location: [place] [facility];  Service: Neurosurgery;  Laterality: N/A;  . PARTIAL HYSTERECTOMY  1960   fibroid tumors, ovaries not removed  . tonsillectomy    . TONSILLECTOMY     Family History  Problem Relation Age of Onset  . Stroke Mother   . Stroke Father   . Hypertension Father   . Diabetes Brother   . Breast cancer Daughter 52  . Colon cancer Neg Hx    Social History   Social History  . Marital  status: Married    Spouse name: N/A  . Number of children: N/A  . Years of education: N/A   Social History Main Topics  . Smoking status: Never Smoker  . Smokeless tobacco: Never Used  . Alcohol use No  . Drug use: No  . Sexual activity: No   Other Topics Concern  . None   Social History Narrative  . None    Outpatient Encounter Prescriptions as of 08/25/2016  Medication Sig  . albuterol (PROVENTIL HFA;VENTOLIN HFA) 108 (90 BASE) MCG/ACT inhaler Inhale 2 puffs into the lungs every 6 (six) hours as needed for wheezing or shortness of breath.  Marland Kitchen amLODipine (NORVASC) 5 MG tablet TAKE 1 TABLET (5 MG TOTAL) BY MOUTH DAILY.  Marland Kitchen aspirin 81 MG tablet Take 81 mg by mouth daily.  . blood glucose meter kit and supplies KIT Dispense based on patient and insurance preference. Use up to four times daily as directed. (FOR ICD-10:E11.65)  . Calcium Citrate-Vitamin D (CALCIUM + D PO) Take 1 tablet by mouth daily.  . fexofenadine (ALLEGRA) 180 MG tablet Take 180 mg by mouth daily.  . fluticasone (FLONASE) 50 MCG/ACT nasal spray Place 2 sprays into both nostrils  daily.  . glucagon (GLUCAGON EMERGENCY) 1 MG injection Inject 1 mg into the muscle once as needed.  Marland Kitchen glucose blood (ONE TOUCH ULTRA TEST) test strip 1 each by Other route See admin instructions. Patient to check four times a day Dx: E11.65  . insulin glargine (LANTUS) 100 UNIT/ML injection Inject 0.33 mLs (33 Units total) into the skin at bedtime. Pens please  - SOLOSTAR (Patient taking differently: Inject 24 Units into the skin at bedtime. Pens please  - SOLOSTAR)  . insulin lispro (HUMALOG KWIKPEN) 100 UNIT/ML KiwkPen Inject on a sliding scale between 14-28 units with each meal. Max daily dose 90 (Patient taking differently: Inject 6-11 Units into the skin 3 (three) times daily. Per sliding scale)  . Lancets (ACCU-CHEK SOFT TOUCH) lancets 1 each by Other route See admin instructions. Check blood sugar 4 times daily  . levothyroxine (SYNTHROID,  LEVOTHROID) 150 MCG tablet TAKE 1 TABLET (150 MCG TOTAL) BY MOUTH DAILY.  Marland Kitchen LORazepam (ATIVAN) 0.5 MG tablet Take 0.5-1 tablets (0.25-0.5 mg total) by mouth daily as needed for anxiety.  . metFORMIN (GLUCOPHAGE-XR) 500 MG 24 hr tablet TAKE 1 TABLET (500 MG TOTAL) BY MOUTH 2 (TWO) TIMES DAILY.  . Multiple Vitamins-Minerals (MULTIVITAMIN PO) Take 1 tablet by mouth daily.   . nitrofurantoin, macrocrystal-monohydrate, (MACROBID) 100 MG capsule Take 1 capsule (100 mg total) by mouth 2 (two) times daily.  . pantoprazole (PROTONIX) 40 MG tablet TAKE 1 TABLET (40 MG TOTAL) BY MOUTH 2 (TWO) TIMES DAILY.  . traMADol (ULTRAM) 50 MG tablet Take 1 tablet (50 mg total) by mouth 2 (two) times daily as needed for moderate pain.  . valsartan-hydrochlorothiazide (DIOVAN-HCT) 160-12.5 MG tablet TAKE 1 TABLET BY MOUTH DAILY.  . [DISCONTINUED] Biotin 2.5 MG CAPS Take 2.5 mg by mouth daily.  . [DISCONTINUED] fluticasone (FLONASE) 50 MCG/ACT nasal spray Place 2 sprays into both nostrils daily.  . [DISCONTINUED] LORazepam (ATIVAN) 0.5 MG tablet Take 0.5-1 tablets (0.25-0.5 mg total) by mouth daily as needed for anxiety.  . [DISCONTINUED] pantoprazole (PROTONIX) 40 MG tablet TAKE 1 TABLET (40 MG TOTAL) BY MOUTH 2 (TWO) TIMES DAILY.  . [DISCONTINUED] traMADol (ULTRAM) 50 MG tablet Take 1 tablet (50 mg total) by mouth 2 (two) times daily as needed for moderate pain.  . [DISCONTINUED] ACCU-CHEK AVIVA PLUS test strip CHECK SUGAR 4 TIMES DAILY DX E11.8   No facility-administered encounter medications on file as of 08/25/2016.     Review of Systems  Constitutional: Negative for appetite change and unexpected weight change.  HENT: Negative for congestion and sinus pressure.   Eyes: Negative for pain and visual disturbance.  Respiratory: Negative for cough, chest tightness and shortness of breath.   Cardiovascular: Negative for chest pain, palpitations and leg swelling.  Gastrointestinal: Negative for abdominal pain,  diarrhea, nausea and vomiting.  Genitourinary: Negative for difficulty urinating and dysuria.  Musculoskeletal: Negative for joint swelling and myalgias.  Skin: Negative for color change and rash.  Neurological: Negative for dizziness, light-headedness and headaches.  Hematological: Negative for adenopathy. Does not bruise/bleed easily.  Psychiatric/Behavioral: Negative for agitation and dysphoric mood.       Objective:     Blood pressure rechecked by me:  136/64  Physical Exam  Constitutional: She is oriented to person, place, and time. She appears well-developed and well-nourished. No distress.  HENT:  Nose: Nose normal.  Mouth/Throat: Oropharynx is clear and moist.  Eyes: Right eye exhibits no discharge. Left eye exhibits no discharge. No scleral icterus.  Neck: Neck  supple. No thyromegaly present.  Cardiovascular: Normal rate and regular rhythm.   Pulmonary/Chest: Breath sounds normal. No accessory muscle usage. No tachypnea. No respiratory distress. She has no decreased breath sounds. She has no wheezes. She has no rhonchi. Right breast exhibits no inverted nipple, no mass, no nipple discharge and no tenderness (no axillary adenopathy). Left breast exhibits no inverted nipple, no mass, no nipple discharge and no tenderness (no axilarry adenopathy).  Abdominal: Soft. Bowel sounds are normal. There is no tenderness.  Musculoskeletal: She exhibits no edema or tenderness.  Lymphadenopathy:    She has no cervical adenopathy.  Neurological: She is alert and oriented to person, place, and time.  Skin: Skin is warm. No rash noted. No erythema.  Psychiatric: She has a normal mood and affect. Her behavior is normal.    BP 136/70 (BP Location: Left Arm, Patient Position: Sitting, Cuff Size: Normal)   Pulse 69   Temp 98.2 F (36.8 C) (Oral)   Ht 5' 3"  (1.6 m)   Wt 182 lb 12.8 oz (82.9 kg)   SpO2 98%   BMI 32.38 kg/m  Wt Readings from Last 3 Encounters:  08/25/16 182 lb 12.8 oz  (82.9 kg)  08/22/16 183 lb 4 oz (83.1 kg)  06/07/16 177 lb 9.6 oz (80.6 kg)     Lab Results  Component Value Date   WBC 5.4 04/11/2016   HGB 12.3 04/11/2016   HCT 36.3 04/11/2016   PLT 295.0 04/11/2016   GLUCOSE 272 (H) 08/22/2016   CHOL 137 08/22/2016   TRIG 58.0 08/22/2016   HDL 49.60 08/22/2016   LDLCALC 75 08/22/2016   ALT 12 08/22/2016   AST 14 08/22/2016   NA 137 08/22/2016   K 4.0 08/22/2016   CL 101 08/22/2016   CREATININE 0.78 08/22/2016   BUN 18 08/22/2016   CO2 26 08/22/2016   TSH 1.13 04/11/2016   HGBA1C 7.7 (H) 04/11/2016   MICROALBUR 2.6 (H) 11/02/2015    Dg Chest 2 View  Result Date: 07/25/2016 CLINICAL DATA:  Follow-up of a likely benign opacity at the right lung base on recent prior chest x-ray. Patient was diagnosed with acute bronchitis at that time and still complains of persistent cough. EXAM: CHEST  2 VIEW COMPARISON:  06/13/2016, 11/19/2015 and earlier. FINDINGS: Cardiac silhouette upper normal in size to slightly enlarged, unchanged. Thoracic aorta mildly atherosclerotic, unchanged. Hilar and mediastinal contours otherwise unremarkable. The opacity questioned on the 06/13/2016 examination has resolved. Lungs now clear. Bronchovascular markings normal. Pulmonary vascularity normal. No pleural effusions. Degenerative changes throughout the thoracic spine. IMPRESSION: 1. Stable borderline heart size.  No acute cardiopulmonary disease. 2. Resolution of the opacity at the right lung base identified on the 06/13/2016 examination. 3.  Aortic Atherosclerosis (ICD10-170.0) Electronically Signed   By: Evangeline Dakin M.D.   On: 07/25/2016 16:14       Assessment & Plan:   Problem List Items Addressed This Visit    GERD (gastroesophageal reflux disease)    Symptoms controlled on protonix.       Relevant Medications   pantoprazole (PROTONIX) 40 MG tablet   Hypercholesterolemia    Low cholesterol diet and exercise.  Follow lipid panel.   Lab Results    Component Value Date   CHOL 137 08/22/2016   HDL 49.60 08/22/2016   LDLCALC 75 08/22/2016   TRIG 58.0 08/22/2016   CHOLHDL 3 08/22/2016        Relevant Orders   Hepatic function panel   Lipid panel  Hypertension    Blood pressure under good control.  Continue same medication regimen.  Follow pressures.  Follow metabolic panel.        Hypothyroidism    On thyroid replacement.  Follow tsh.        Nephrolithiasis    Followed by Dr Jacqlyn Larsen.       Relevant Medications   traMADol (ULTRAM) 50 MG tablet   Obesity (BMI 30-39.9)    Diet and exercise.  Follow.       Spinal stenosis    S/p surgery.  Takes tramadol prn.  Follow.       Type 2 diabetes mellitus, uncontrolled ([facility])    Sugars as outlined.  Recently elevated secondary to eating sweets.  Low carb diet and exercise.  Follow met b and a1c.  Followed by endocrinology.        Relevant Orders   Basic metabolic panel   Microalbumin / creatinine urine ratio       Einar Pheasant, MD

## 2016-08-27 ENCOUNTER — Encounter: Payer: Self-pay | Admitting: Internal Medicine

## 2016-08-27 NOTE — Assessment & Plan Note (Signed)
Symptoms controlled on protonix.   

## 2016-08-27 NOTE — Assessment & Plan Note (Signed)
Sugars as outlined.  Recently elevated secondary to eating sweets.  Low carb diet and exercise.  Follow met b and a1c.  Followed by endocrinology.

## 2016-08-27 NOTE — Assessment & Plan Note (Signed)
S/p surgery.  Takes tramadol prn.  Follow.

## 2016-08-27 NOTE — Assessment & Plan Note (Signed)
Low cholesterol diet and exercise.  Follow lipid panel.   Lab Results  Component Value Date   CHOL 137 08/22/2016   HDL 49.60 08/22/2016   LDLCALC 75 08/22/2016   TRIG 58.0 08/22/2016   CHOLHDL 3 08/22/2016

## 2016-08-27 NOTE — Assessment & Plan Note (Signed)
Diet and exercise.  Follow.  

## 2016-08-27 NOTE — Assessment & Plan Note (Signed)
On thyroid replacement.  Follow tsh.  

## 2016-08-27 NOTE — Assessment & Plan Note (Signed)
Followed by Dr Cope.  

## 2016-08-27 NOTE — Assessment & Plan Note (Signed)
Blood pressure under good control.  Continue same medication regimen.  Follow pressures.  Follow metabolic panel.   

## 2016-08-31 DIAGNOSIS — J301 Allergic rhinitis due to pollen: Secondary | ICD-10-CM | POA: Diagnosis not present

## 2016-09-08 DIAGNOSIS — J301 Allergic rhinitis due to pollen: Secondary | ICD-10-CM | POA: Diagnosis not present

## 2016-09-12 DIAGNOSIS — J301 Allergic rhinitis due to pollen: Secondary | ICD-10-CM | POA: Diagnosis not present

## 2016-09-19 DIAGNOSIS — J301 Allergic rhinitis due to pollen: Secondary | ICD-10-CM | POA: Diagnosis not present

## 2016-09-26 DIAGNOSIS — J301 Allergic rhinitis due to pollen: Secondary | ICD-10-CM | POA: Diagnosis not present

## 2016-10-03 DIAGNOSIS — J301 Allergic rhinitis due to pollen: Secondary | ICD-10-CM | POA: Diagnosis not present

## 2016-10-10 DIAGNOSIS — J301 Allergic rhinitis due to pollen: Secondary | ICD-10-CM | POA: Diagnosis not present

## 2016-10-11 DIAGNOSIS — E11649 Type 2 diabetes mellitus with hypoglycemia without coma: Secondary | ICD-10-CM | POA: Diagnosis not present

## 2016-10-11 DIAGNOSIS — E039 Hypothyroidism, unspecified: Secondary | ICD-10-CM | POA: Diagnosis not present

## 2016-10-11 DIAGNOSIS — Z794 Long term (current) use of insulin: Secondary | ICD-10-CM | POA: Diagnosis not present

## 2016-10-17 DIAGNOSIS — J301 Allergic rhinitis due to pollen: Secondary | ICD-10-CM | POA: Diagnosis not present

## 2016-10-24 DIAGNOSIS — J301 Allergic rhinitis due to pollen: Secondary | ICD-10-CM | POA: Diagnosis not present

## 2016-10-31 DIAGNOSIS — J301 Allergic rhinitis due to pollen: Secondary | ICD-10-CM | POA: Diagnosis not present

## 2016-11-07 DIAGNOSIS — J301 Allergic rhinitis due to pollen: Secondary | ICD-10-CM | POA: Diagnosis not present

## 2016-11-14 DIAGNOSIS — J301 Allergic rhinitis due to pollen: Secondary | ICD-10-CM | POA: Diagnosis not present

## 2016-11-21 DIAGNOSIS — J301 Allergic rhinitis due to pollen: Secondary | ICD-10-CM | POA: Diagnosis not present

## 2016-11-28 DIAGNOSIS — J301 Allergic rhinitis due to pollen: Secondary | ICD-10-CM | POA: Diagnosis not present

## 2016-12-05 DIAGNOSIS — J301 Allergic rhinitis due to pollen: Secondary | ICD-10-CM | POA: Diagnosis not present

## 2016-12-08 ENCOUNTER — Ambulatory Visit: Payer: PPO

## 2016-12-18 ENCOUNTER — Other Ambulatory Visit: Payer: Self-pay | Admitting: Internal Medicine

## 2016-12-19 DIAGNOSIS — J301 Allergic rhinitis due to pollen: Secondary | ICD-10-CM | POA: Diagnosis not present

## 2016-12-28 DIAGNOSIS — J301 Allergic rhinitis due to pollen: Secondary | ICD-10-CM | POA: Diagnosis not present

## 2017-01-04 ENCOUNTER — Encounter: Payer: Self-pay | Admitting: Internal Medicine

## 2017-01-04 ENCOUNTER — Other Ambulatory Visit (INDEPENDENT_AMBULATORY_CARE_PROVIDER_SITE_OTHER): Payer: PPO

## 2017-01-04 DIAGNOSIS — Z794 Long term (current) use of insulin: Secondary | ICD-10-CM | POA: Diagnosis not present

## 2017-01-04 DIAGNOSIS — E1165 Type 2 diabetes mellitus with hyperglycemia: Secondary | ICD-10-CM | POA: Diagnosis not present

## 2017-01-04 DIAGNOSIS — E7800 Pure hypercholesterolemia, unspecified: Secondary | ICD-10-CM

## 2017-01-04 DIAGNOSIS — J301 Allergic rhinitis due to pollen: Secondary | ICD-10-CM | POA: Diagnosis not present

## 2017-01-04 DIAGNOSIS — IMO0001 Reserved for inherently not codable concepts without codable children: Secondary | ICD-10-CM

## 2017-01-04 LAB — HEPATIC FUNCTION PANEL
ALT: 15 U/L (ref 0–35)
AST: 15 U/L (ref 0–37)
Albumin: 4 g/dL (ref 3.5–5.2)
Alkaline Phosphatase: 81 U/L (ref 39–117)
Bilirubin, Direct: 0.1 mg/dL (ref 0.0–0.3)
Total Bilirubin: 0.5 mg/dL (ref 0.2–1.2)
Total Protein: 6.8 g/dL (ref 6.0–8.3)

## 2017-01-04 LAB — LIPID PANEL
Cholesterol: 148 mg/dL (ref 0–200)
HDL: 46.2 mg/dL (ref >39.00)
LDL Cholesterol: 88 mg/dL (ref 0–99)
NonHDL: 102.24
Total CHOL/HDL Ratio: 3
Triglycerides: 73 mg/dL (ref 0.0–149.0)
VLDL: 14.6 mg/dL (ref 0.0–40.0)

## 2017-01-04 LAB — BASIC METABOLIC PANEL
BUN: 20 mg/dL (ref 6–23)
CO2: 28 mEq/L (ref 19–32)
Calcium: 9.3 mg/dL (ref 8.4–10.5)
Chloride: 103 mEq/L (ref 96–112)
Creatinine, Ser: 0.8 mg/dL (ref 0.40–1.20)
GFR: 72.66 mL/min (ref >60.00)
Glucose, Bld: 206 mg/dL — ABNORMAL HIGH (ref 70–99)
Potassium: 4.6 mEq/L (ref 3.5–5.1)
Sodium: 140 mEq/L (ref 135–145)

## 2017-01-04 LAB — MICROALBUMIN / CREATININE URINE RATIO
Creatinine,U: 88.9 mg/dL
Microalb Creat Ratio: 4.6 mg/g (ref 0.0–30.0)
Microalb, Ur: 4.1 mg/dL — ABNORMAL HIGH (ref 0.0–1.9)

## 2017-01-05 ENCOUNTER — Ambulatory Visit (INDEPENDENT_AMBULATORY_CARE_PROVIDER_SITE_OTHER): Payer: PPO | Admitting: Internal Medicine

## 2017-01-05 ENCOUNTER — Encounter: Payer: Self-pay | Admitting: Internal Medicine

## 2017-01-05 DIAGNOSIS — IMO0001 Reserved for inherently not codable concepts without codable children: Secondary | ICD-10-CM

## 2017-01-05 DIAGNOSIS — K3184 Gastroparesis: Secondary | ICD-10-CM

## 2017-01-05 DIAGNOSIS — E669 Obesity, unspecified: Secondary | ICD-10-CM | POA: Diagnosis not present

## 2017-01-05 DIAGNOSIS — I10 Essential (primary) hypertension: Secondary | ICD-10-CM

## 2017-01-05 DIAGNOSIS — Z794 Long term (current) use of insulin: Secondary | ICD-10-CM

## 2017-01-05 DIAGNOSIS — E038 Other specified hypothyroidism: Secondary | ICD-10-CM

## 2017-01-05 DIAGNOSIS — E1165 Type 2 diabetes mellitus with hyperglycemia: Secondary | ICD-10-CM

## 2017-01-05 DIAGNOSIS — N182 Chronic kidney disease, stage 2 (mild): Secondary | ICD-10-CM | POA: Diagnosis not present

## 2017-01-05 DIAGNOSIS — K219 Gastro-esophageal reflux disease without esophagitis: Secondary | ICD-10-CM

## 2017-01-05 DIAGNOSIS — E7800 Pure hypercholesterolemia, unspecified: Secondary | ICD-10-CM

## 2017-01-05 DIAGNOSIS — E1122 Type 2 diabetes mellitus with diabetic chronic kidney disease: Secondary | ICD-10-CM

## 2017-01-05 DIAGNOSIS — N1330 Unspecified hydronephrosis: Secondary | ICD-10-CM

## 2017-01-05 DIAGNOSIS — M4800 Spinal stenosis, site unspecified: Secondary | ICD-10-CM

## 2017-01-05 DIAGNOSIS — Z0000 Encounter for general adult medical examination without abnormal findings: Secondary | ICD-10-CM

## 2017-01-05 NOTE — Assessment & Plan Note (Signed)
Physical today 01/05/17.  Mammogram

## 2017-01-05 NOTE — Progress Notes (Signed)
Patient ID: Marissa Huff, female   DOB: [birth year], 84 y.o.   MRN: [id number]   Subjective:    Patient ID: Marissa Huff, female    DOB: April 12, 1931, 84 y.o.   MRN: [id number]  HPI  Patient with past history of diabetes, hypertension and hypothyroidism.  She comes in today to follow up on these issues as well as for a complete physical exam.  Sees Dr Eddie Dibbles for her diabetes.  Last evaluated 10/2016.  Note reviewed.  Informed to dose humalog based on carbs.  She does not adjust her humalog dose.  Does adjust her lantus.  States am sugars averaging low 100s and pm sugars averaging 200s.  Followed by Dr Eddie Dibbles.  No chest pain.  Cough and congestion better.  Breathing back to her baseline.   No acid reflux.  No abdominal pain.  Bowels moving.  Discussed shingrix.  Has not taken tramadol in weeks.     Past Medical History:  Diagnosis Date  . Allergy   . Arthritis   . Carpal tunnel syndrome, bilateral   . Diabetes mellitus without complication ([place])   . Diverticulosis   . 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 Gastroparesis   . GERD (gastroesophageal reflux disease)   . Hypertension   . Hypothyroidism   . PUD (peptic ulcer disease)   . Skin cancer   . Spinal stenosis    lumbar laminectomy   Past Surgical History:  Procedure Laterality Date  . ABDOMINAL HYSTERECTOMY    . antrectomy and vagotomy  1994  . arthroscopic left knee  12/05/08  . BACK SURGERY    . carpal tunnel repai  10/10   left   . CATARACT EXTRACTION, BILATERAL    . EYE SURGERY     bilateral cataracts  . 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 LAPAROSCOPIC CHOLECYSTECTOMY  6/01  . LUMBAR LAMINECTOMY  1999  . LUMBAR LAMINECTOMY WITH COFLEX 1 LEVEL N/A 09/15/2015   Procedure: Lumbar one-two Laminectomy with placement of coflex;  Surgeon: Kristeen Miss, MD;  Location: [facility];  Service: Neurosurgery;  Laterality: N/A;  . PARTIAL HYSTERECTOMY  1960   fibroid tumors, ovaries not removed  . tonsillectomy    . TONSILLECTOMY     Family  History  Problem Relation Age of Onset  . Stroke Mother   . Stroke Father   . Hypertension Father   . Diabetes Brother   . Breast cancer Daughter 63  . Colon cancer Neg Hx    Social History   Socioeconomic History  . Marital status: Married    Spouse name: None  . Number of children: None  . Years of education: None  . Highest education level: None  Social Needs  . Financial resource strain: None  . Food insecurity - worry: None  . Food insecurity - inability: None  . Transportation needs - medical: None  . Transportation needs - non-medical: None  Occupational History  . None  Tobacco Use  . Smoking status: Never Smoker  . Smokeless tobacco: Never Used  Substance and Sexual Activity  . Alcohol use: No    Alcohol/week: 0.0 oz  . Drug use: No  . Sexual activity: No  Other Topics Concern  . None  Social History Narrative  . None    Outpatient Encounter Medications as of 01/05/2017  Medication Sig  . albuterol (PROVENTIL HFA;VENTOLIN HFA) 108 (90 BASE) MCG/ACT inhaler Inhale 2 puffs into the lungs every 6 (six) hours as needed for  wheezing or shortness of breath.  Marland Kitchen amLODipine (NORVASC) 5 MG tablet TAKE 1 TABLET (5 MG TOTAL) BY MOUTH DAILY.  Marland Kitchen aspirin 81 MG tablet Take 81 mg by mouth daily.  . blood glucose meter kit and supplies KIT Dispense based on patient and insurance preference. Use up to four times daily as directed. (FOR ICD-10:E11.65)  . Calcium Citrate-Vitamin D (CALCIUM + D PO) Take 1 tablet by mouth daily.  . fexofenadine (ALLEGRA) 180 MG tablet Take 180 mg by mouth daily.  . fluticasone (FLONASE) 50 MCG/ACT nasal spray Place 2 sprays into both nostrils daily.  Marland Kitchen glucagon (GLUCAGON EMERGENCY) 1 MG injection Inject 1 mg into the muscle once as needed.  Marland Kitchen glucose blood (ONE TOUCH ULTRA TEST) test strip 1 each by Other route See admin instructions. Patient to check four times a day Dx: E11.65  . insulin glargine (LANTUS) 100 UNIT/ML injection Inject 0.33 mLs (33  Units total) into the skin at bedtime. Pens please  - SOLOSTAR (Patient taking differently: Inject 24 Units into the skin at bedtime. Pens please  - SOLOSTAR)  . insulin lispro (HUMALOG KWIKPEN) 100 UNIT/ML KiwkPen Inject on a sliding scale between 14-28 units with each meal. Max daily dose 90 (Patient taking differently: Inject 6-11 Units into the skin 3 (three) times daily. Per sliding scale)  . Lancets (ACCU-CHEK SOFT TOUCH) lancets 1 each by Other route See admin instructions. Check blood sugar 4 times daily  . levothyroxine (SYNTHROID, LEVOTHROID) 150 MCG tablet TAKE 1 TABLET (150 MCG TOTAL) BY MOUTH DAILY.  Marland Kitchen LORazepam (ATIVAN) 0.5 MG tablet Take 0.5-1 tablets (0.25-0.5 mg total) by mouth daily as needed for anxiety.  . metFORMIN (GLUCOPHAGE-XR) 500 MG 24 hr tablet TAKE 1 TABLET (500 MG TOTAL) BY MOUTH 2 (TWO) TIMES DAILY.  . Multiple Vitamins-Minerals (MULTIVITAMIN PO) Take 1 tablet by mouth daily.   . pantoprazole (PROTONIX) 40 MG tablet TAKE 1 TABLET (40 MG TOTAL) BY MOUTH 2 (TWO) TIMES DAILY.  . traMADol (ULTRAM) 50 MG tablet Take 1 tablet (50 mg total) by mouth 2 (two) times daily as needed for moderate pain.  . valsartan-hydrochlorothiazide (DIOVAN-HCT) 160-12.5 MG tablet TAKE 1 TABLET BY MOUTH DAILY.  . nitrofurantoin, macrocrystal-monohydrate, (MACROBID) 100 MG capsule Take 1 capsule (100 mg total) by mouth 2 (two) times daily. (Patient not taking: Reported on 01/05/2017)   No facility-administered encounter medications on file as of 01/05/2017.     Review of Systems  Constitutional: Negative for appetite change and unexpected weight change.  HENT: Negative for congestion and sinus pressure.   Eyes: Negative for pain and visual disturbance.  Respiratory: Negative for cough, chest tightness and shortness of breath.   Cardiovascular: Negative for chest pain, palpitations and leg swelling.  Gastrointestinal: Negative for abdominal pain, diarrhea, nausea and vomiting.  Genitourinary:  Negative for difficulty urinating and dysuria.  Musculoskeletal: Negative for joint swelling and myalgias.  Skin: Negative for color change and rash.  Neurological: Negative for dizziness, light-headedness and headaches.  Hematological: Negative for adenopathy. Does not bruise/bleed easily.  Psychiatric/Behavioral: Negative for agitation and dysphoric mood.       Objective:     Blood pressure rechecked by me:  132/68  Physical Exam  Constitutional: She is oriented to person, place, and time. She appears well-developed and well-nourished. No distress.  HENT:  Nose: Nose normal.  Mouth/Throat: Oropharynx is clear and moist.  Eyes: Right eye exhibits no discharge. Left eye exhibits no discharge. No scleral icterus.  Neck: Neck supple. No thyromegaly  present.  Cardiovascular: Normal rate and regular rhythm.  Pulmonary/Chest: Breath sounds normal. No accessory muscle usage. No tachypnea. No respiratory distress. She has no decreased breath sounds. She has no wheezes. She has no rhonchi. Right breast exhibits no inverted nipple, no mass, no nipple discharge and no tenderness (no axillary adenopathy). Left breast exhibits no inverted nipple, no mass, no nipple discharge and no tenderness (no axilarry adenopathy).  Abdominal: Soft. Bowel sounds are normal. There is no tenderness.  Musculoskeletal: She exhibits no edema or tenderness.  DP pulses palpable and equal bilaterally.   Lymphadenopathy:    She has no cervical adenopathy.  Neurological: She is alert and oriented to person, place, and time.  Skin: Skin is warm. No rash noted. No erythema.  Psychiatric: She has a normal mood and affect. Her behavior is normal.    BP 132/68   Pulse 65   Temp 98.5 F (36.9 C) (Oral)   Resp 16   Ht '5\' 4"'$  (1.626 m)   Wt 189 lb 3.2 oz (85.8 kg)   SpO2 98%   BMI 32.48 kg/m  Wt Readings from Last 3 Encounters:  01/05/17 189 lb 3.2 oz (85.8 kg)  08/25/16 182 lb 12.8 oz (82.9 kg)  08/22/16 183 lb 4  oz (83.1 kg)     Lab Results  Component Value Date   WBC 5.4 04/11/2016   HGB 12.3 04/11/2016   HCT 36.3 04/11/2016   PLT 295.0 04/11/2016   GLUCOSE 206 (H) 01/04/2017   CHOL 148 01/04/2017   TRIG 73.0 01/04/2017   HDL 46.20 01/04/2017   LDLCALC 88 01/04/2017   ALT 15 01/04/2017   AST 15 01/04/2017   NA 140 01/04/2017   K 4.6 01/04/2017   CL 103 01/04/2017   CREATININE 0.80 01/04/2017   BUN 20 01/04/2017   CO2 28 01/04/2017   TSH 1.13 04/11/2016   HGBA1C 7.7 (H) 04/11/2016   MICROALBUR 4.1 (H) 01/04/2017    Dg Chest 2 View  Result Date: 07/25/2016 CLINICAL DATA:  Follow-up of a likely benign opacity at the right lung base on recent prior chest x-ray. Patient was diagnosed with acute bronchitis at that time and still complains of persistent cough. EXAM: CHEST  2 VIEW COMPARISON:  06/13/2016, 11/19/2015 and earlier. FINDINGS: Cardiac silhouette upper normal in size to slightly enlarged, unchanged. Thoracic aorta mildly atherosclerotic, unchanged. Hilar and mediastinal contours otherwise unremarkable. The opacity questioned on the 06/13/2016 examination has resolved. Lungs now clear. Bronchovascular markings normal. Pulmonary vascularity normal. No pleural effusions. Degenerative changes throughout the thoracic spine. IMPRESSION: 1. Stable borderline heart size.  No acute cardiopulmonary disease. 2. Resolution of the opacity at the right lung base identified on the 06/13/2016 examination. 3.  Aortic Atherosclerosis (ICD10-170.0) Electronically Signed   By: Evangeline Dakin M.D.   On: 07/25/2016 16:14       Assessment & Plan:   Problem List Items Addressed This Visit    CKD stage 2 due to type 2 diabetes mellitus ([place])    Stay hydrated.  Follow renal function.        Gastroparesis    Followed by GI.  Stable.       GERD (gastroesophageal reflux disease)    Controlled on protonix.        Health care maintenance    Physical today 01/05/17.  Mammogram       Hydronephrosis,  bilateral    Continue to f/u with Dr Jacqlyn Larsen.  Changes felt to be c/w UPJ obstruction.  Follow  renal function.        Hypercholesterolemia    Low cholesterol diet and exercise.  Follow lipid panel.   Lab Results  Component Value Date   CHOL 148 01/04/2017   HDL 46.20 01/04/2017   LDLCALC 88 01/04/2017   TRIG 73.0 01/04/2017   CHOLHDL 3 01/04/2017        Relevant Orders   Lipid panel   Hepatic function panel   Hypertension    Blood pressure on recheck improved.  Continue same medication regimen.  Follow pressures.  Follow metabolic panel.        Relevant Orders   CBC with Differential/Platelet   TSH   Hypothyroidism    On thyroid replacement.  Follow tsh.       Obesity (BMI 30-39.9)    Discussed diet and exercise.  Follow.        Spinal stenosis    S/p surgery.  Has not taken tramadol in weeks.  Follow.        Type 2 diabetes mellitus, uncontrolled ([facility])    Sugars as outlined.  Followed by Dr Eddie Dibbles.  She is following a1c.  Up to date with eye checks.         Other Visit Diagnoses    Uncontrolled type 2 diabetes mellitus without complication, with long-term current use of insulin ([facility])   (Chronic)     Relevant Orders   Hemoglobin J6Z   Basic metabolic panel       Einar Pheasant, MD

## 2017-01-08 ENCOUNTER — Encounter: Payer: Self-pay | Admitting: Internal Medicine

## 2017-01-08 NOTE — Assessment & Plan Note (Signed)
Stay hydrated.  Follow renal function.  

## 2017-01-08 NOTE — Assessment & Plan Note (Signed)
S/p surgery.  Has not taken tramadol in weeks.  Follow.

## 2017-01-08 NOTE — Assessment & Plan Note (Signed)
Continue to f/u with Dr Jacqlyn Larsen.  Changes felt to be c/w UPJ obstruction.  Follow renal function.

## 2017-01-08 NOTE — Assessment & Plan Note (Signed)
On thyroid replacement.  Follow tsh.  

## 2017-01-08 NOTE — Assessment & Plan Note (Signed)
Sugars as outlined.  Followed by Dr Eddie Dibbles.  She is following a1c.  Up to date with eye checks.

## 2017-01-08 NOTE — Assessment & Plan Note (Signed)
Low cholesterol diet and exercise.  Follow lipid panel.   Lab Results  Component Value Date   CHOL 148 01/04/2017   HDL 46.20 01/04/2017   LDLCALC 88 01/04/2017   TRIG 73.0 01/04/2017   CHOLHDL 3 01/04/2017

## 2017-01-08 NOTE — Assessment & Plan Note (Signed)
Discussed diet and exercise.  Follow.  

## 2017-01-08 NOTE — Assessment & Plan Note (Signed)
Followed by GI.  Stable.   

## 2017-01-08 NOTE — Assessment & Plan Note (Signed)
Controlled on protonix.   

## 2017-01-08 NOTE — Assessment & Plan Note (Signed)
Blood pressure on recheck improved.  Continue same medication regimen.  Follow pressures.  Follow metabolic panel.   

## 2017-01-09 DIAGNOSIS — J301 Allergic rhinitis due to pollen: Secondary | ICD-10-CM | POA: Diagnosis not present

## 2017-01-13 ENCOUNTER — Other Ambulatory Visit: Payer: Self-pay | Admitting: Internal Medicine

## 2017-01-16 DIAGNOSIS — J301 Allergic rhinitis due to pollen: Secondary | ICD-10-CM | POA: Diagnosis not present

## 2017-01-18 ENCOUNTER — Ambulatory Visit (INDEPENDENT_AMBULATORY_CARE_PROVIDER_SITE_OTHER): Payer: PPO

## 2017-01-18 VITALS — BP 124/64 | HR 65 | Temp 98.5°F | Resp 15 | Ht 63.5 in | Wt 189.8 lb

## 2017-01-18 DIAGNOSIS — Z0000 Encounter for general adult medical examination without abnormal findings: Secondary | ICD-10-CM

## 2017-01-18 DIAGNOSIS — Z1331 Encounter for screening for depression: Secondary | ICD-10-CM

## 2017-01-18 NOTE — Progress Notes (Signed)
Subjective:   Marissa Huff is a 84 y.o. female who presents for Medicare Annual (Subsequent) preventive examination.  Review of Systems:  No ROS.  Medicare Wellness Visit. Additional risk factors are reflected in the social history.  Cardiac Risk Factors include: advanced age (>22mn, >>24women);diabetes mellitus;obesity (BMI >30kg/m2)     Objective:     Vitals: BP 124/64 (BP Location: Left Arm, Patient Position: Sitting, Cuff Size: Normal)   Pulse 65   Temp 98.5 F (36.9 C) (Oral)   Resp 15   Ht 5' 3.5" (1.613 m)   Wt 189 lb 12.8 oz (86.1 kg)   SpO2 98%   BMI 33.09 kg/m   Body mass index is 33.09 kg/m.  Advanced Directives 01/18/2017 12/09/2015 09/09/2015 12/09/2014  Does Patient Have a Medical Advance Directive? No No No No  Would patient like information on creating a medical advance directive? No - Patient declined No - Patient declined No - patient declined information No - patient declined information    Tobacco Social History   Tobacco Use  Smoking Status Never Smoker  Smokeless Tobacco Never Used     Counseling given: Not Answered   Clinical Intake:  Pre-visit preparation completed: Yes  Pain : No/denies pain     Nutritional Status: BMI > 30  Obese Diabetes: Yes(Followed by Dr. PEddie Dibbles  How often do you need to have someone help you when you read instructions, pamphlets, or other written materials from your doctor or pharmacy?: 1 - Never  Interpreter Needed?: No     Past Medical History:  Diagnosis Date  . Allergy   . Arthritis   . Carpal tunnel syndrome, bilateral   . Diabetes mellitus [place] complication ([place]   . Diverticulosis   . [place]   . Gastroparesis   . GERD (gastroesophageal reflux disease)   . Hypertension   . Hypothyroidism   . PUD (peptic ulcer disease)   . Skin cancer   . Spinal stenosis    lumbar laminectomy   Past Surgical History:  Procedure Laterality Date  . ABDOMINAL HYSTERECTOMY    . antrectomy  and vagotomy  1994  . arthroscopic left knee  12/05/08  . BACK SURGERY    . carpal tunnel repai  10/10   left   . CATARACT EXTRACTION, BILATERAL    . EYE SURGERY     bilateral cataracts  . 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 LAPAROSCOPIC CHOLECYSTECTOMY  6/01  . LUMBAR LAMINECTOMY  1999  . LUMBAR LAMINECTOMY WITH COFLEX 1 LEVEL N/A 09/15/2015   Procedure: Lumbar one-two Laminectomy with placement of coflex;  Surgeon: HKristeen Miss MD;  [facility];  Service: Neurosurgery;  Laterality: N/A;  . PARTIAL HYSTERECTOMY  1960   fibroid tumors, ovaries not removed  . tonsillectomy    . TONSILLECTOMY     Family History  Problem Relation Age of Onset  . Stroke Mother   . Stroke Father   . Hypertension Father   . Diabetes Brother   . Breast cancer Daughter 438 . Colon cancer Neg Hx    Social History   Socioeconomic History  . Marital status: Married    Spouse name: None  . Number of children: None  . Years of education: None  . Highest education level: None  Social Needs  . Financial resource strain: None  . Food insecurity - worry: None  . Food insecurity - inability: None  . Transportation needs - medical: None  .  Transportation needs - non-medical: None  Occupational History  . None  Tobacco Use  . Smoking status: Never Smoker  . Smokeless tobacco: Never Used  Substance and Sexual Activity  . Alcohol use: No    Alcohol/week: 0.0 oz  . Drug use: No  . Sexual activity: No  Other Topics Concern  . None  Social History Narrative  . None    Outpatient Encounter Medications as of 01/18/2017  Medication Sig  . albuterol (PROVENTIL HFA;VENTOLIN HFA) 108 (90 BASE) MCG/ACT inhaler Inhale 2 puffs into the lungs every 6 (six) hours as needed for wheezing or shortness of breath.  Marland Kitchen amLODipine (NORVASC) 5 MG tablet TAKE 1 TABLET (5 MG TOTAL) BY MOUTH DAILY.  Marland Kitchen aspirin 81 MG tablet Take 81 mg by mouth daily.  . blood glucose meter kit and supplies KIT Dispense based on  patient and insurance preference. Use up to four times daily as directed. (FOR ICD-10:E11.65)  . Calcium Citrate-Vitamin D (CALCIUM + D PO) Take 1 tablet by mouth daily.  . fexofenadine (ALLEGRA) 180 MG tablet Take 180 mg by mouth daily.  . fluticasone (FLONASE) 50 MCG/ACT nasal spray Place 2 sprays into both nostrils daily.  Marland Kitchen glucagon (GLUCAGON EMERGENCY) 1 MG injection Inject 1 mg into the muscle once as needed.  Marland Kitchen glucose blood (ONE TOUCH ULTRA TEST) test strip 1 each by Other route See admin instructions. Patient to check four times a day Dx: E11.65  . insulin glargine (LANTUS) 100 UNIT/ML injection Inject 0.33 mLs (33 Units total) into the skin at bedtime. Pens please  - SOLOSTAR (Patient taking differently: Inject 24 Units into the skin at bedtime. Pens please  - SOLOSTAR)  . insulin lispro (HUMALOG KWIKPEN) 100 UNIT/ML KiwkPen Inject on a sliding scale between 14-28 units with each meal. Max daily dose 90 (Patient taking differently: Inject 6-11 Units into the skin 3 (three) times daily. Per sliding scale)  . Lancets (ACCU-CHEK SOFT TOUCH) lancets 1 each by Other route See admin instructions. Check blood sugar 4 times daily  . levothyroxine (SYNTHROID, LEVOTHROID) 150 MCG tablet TAKE 1 TABLET (150 MCG TOTAL) BY MOUTH DAILY.  Marland Kitchen LORazepam (ATIVAN) 0.5 MG tablet Take 0.5-1 tablets (0.25-0.5 mg total) by mouth daily as needed for anxiety.  . metFORMIN (GLUCOPHAGE-XR) 500 MG 24 hr tablet TAKE 1 TABLET (500 MG TOTAL) BY MOUTH 2 (TWO) TIMES DAILY.  . Multiple Vitamins-Minerals (MULTIVITAMIN PO) Take 1 tablet by mouth daily.   . nitrofurantoin, macrocrystal-monohydrate, (MACROBID) 100 MG capsule Take 1 capsule (100 mg total) by mouth 2 (two) times daily.  . pantoprazole (PROTONIX) 40 MG tablet TAKE 1 TABLET (40 MG TOTAL) BY MOUTH 2 (TWO) TIMES DAILY.  . valsartan-hydrochlorothiazide (DIOVAN-HCT) 160-12.5 MG tablet TAKE 1 TABLET BY MOUTH DAILY.  . traMADol (ULTRAM) 50 MG tablet Take 1 tablet (50 mg  total) by mouth 2 (two) times daily as needed for moderate pain. (Patient not taking: Reported on 01/18/2017)   No facility-administered encounter medications on file as of 01/18/2017.     Activities of Daily Living In your present state of health, do you have any difficulty performing the following activities: 01/18/2017  Hearing? Y  Vision? N  Difficulty concentrating or making decisions? N  Walking or climbing stairs? Y  Dressing or bathing? N  Doing errands, shopping? Y  Preparing Food and eating ? N  Using the Toilet? N  In the past six months, have you accidently leaked urine? Y  Comment Bladder incontinence.  Followed by Dr.  Cope.  Managed with a daily brief.  Do you have problems with loss of bowel control? N  Managing your Medications? N  Managing your Finances? N  Housekeeping or managing your Housekeeping? Y  Comment daughter assists as needed  Some recent data might be hidden    Patient Care Team: Einar Pheasant, MD as PCP - General (Internal Medicine)    Assessment:   This is a routine wellness examination for Saniyah. The goal of the wellness visit is to assist the patient how to close the gaps in care and create a preventative care plan for the patient.   The roster of all physicians providing medical care to patient is listed in the Snapshot section of the chart.  Taking calcium VIT D as appropriate/Osteoporosis risk reviewed.    Safety issues reviewed; Smoke and carbon monoxide detectors in the home. No firearms in the home.  Wears seatbelts when driving or riding with others. Patient does wear sunscreen or protective clothing when in direct sunlight. No violence in the home.  Depression- PHQ 2 &9 complete.  No signs/symptoms or verbal communication regarding little pleasure in doing things, feeling down, depressed or hopeless. No changes in sleeping, energy, eating, concentrating.  No thoughts of self harm or harm towards others.  Time spent on this topic is 10  minutes.   Patient is alert, normal appearance, oriented to person/place/and time. Correctly identified the president of the [place], recall of 3/3 words. Some difficulty performing simple calculations. Displays appropriate judgement and can read correct time from watch face.   No new identified risk were noted.  No failures at ADL's or IADL's.    BMI- discussed the importance of a healthy diet, water intake and the benefits of aerobic exercise. Educational material provided.   24 hour diet recall: Regular diet; she tries to monitor carb intake  Daily fluid intake: 2 cups of caffeine, 5 cups of water  Dental- every 6 months.  Dr. Zigmund Daniel.   Eye- Visual acuity not assessed per patient preference since they have regular follow up with the ophthalmologist.  Wears corrective lenses.  Sleep patterns- Sleeps 6-8 hours at night.  Wakes feeling rested.  TDAP vaccine deferred per patient preference.  Follow up with insurance.  Educational material provided.  Mammogram scheduled.  Patient Concerns: None at this time. Follow up with PCP as needed.  Exercise Activities and Dietary recommendations Current Exercise Habits: The patient does not participate in regular exercise at present  Goals    . Increase physical activity     Start Weight Watchers or TOPS program      . Reduce Sugar Intake     Portion control         Fall Risk Fall Risk  01/18/2017 06/07/2016 12/09/2015 10/02/2015 12/09/2014  Falls in the past year? No No No No Yes  Number falls in past yr: - - - - 1  Injury with Fall? - - - - No  Comment - - - - -  Risk Factor Category  - - - - -  Follow up - - - - Education provided;Falls prevention discussed   Depression Screen PHQ 2/9 Scores 01/18/2017 06/07/2016 12/09/2015 10/02/2015  PHQ - 2 Score 0 0 0 0  PHQ- 9 Score 0 - - -     Cognitive Function MMSE - Mini Mental State Exam 01/18/2017 12/09/2015 12/09/2014  Orientation to time _0 Orientation to Place _1 Registration _2 Attention/  Calculation _0 Recall _1 Language- name 2 objects _2 Language- repeat _3 Language- follow 3 step command _4 Language- read & follow direction _5 Write a sentence _6 Copy design _7 Total score _8 Immunization History  Administered Date(s) Administered  . Influenza Split 12/12/2011, 10/01/2012  . Influenza, High Dose Seasonal PF 10/02/2015, 09/08/2016  . Influenza,inj,Quad PF,6+ Mos 09/03/2013, 10/27/2014  . Pneumococcal Conjugate-13 11/11/2014  . Pneumococcal Polysaccharide-23 12/09/2015   Screening Tests Health Maintenance  Topic Date Due  . TETANUS/TDAP  03/09/1950  . HEMOGLOBIN A1C  10/11/2016  . MAMMOGRAM  10/22/2016  . OPHTHALMOLOGY EXAM  03/23/2017  . FOOT EXAM  04/13/2017  . INFLUENZA VACCINE  Completed  . DEXA SCAN  Completed  . PNA vac Low Risk Adult  Completed      Plan:   End of life planning; Advanced aging; Advanced directives discussed.  No HCPOA/Living Will.  Additional information declined at this time.  I have personally reviewed and noted the following in the patient's chart:   . Medical and social history . Use of alcohol, tobacco or illicit drugs  . Current medications and supplements . Functional ability and status . Nutritional status . Physical activity . Advanced directives . List of other physicians . Hospitalizations, surgeries, and ER visits in previous 12 months . Vitals . Screenings to include cognitive, depression, and falls . Referrals and appointments  In addition, I have reviewed and discussed with patient certain preventive protocols, quality metrics, and best practice recommendations. A written personalized care plan for preventive services as well as general preventive health recommendations were provided to patient.     Varney Biles, LPN  1/55/2080

## 2017-01-18 NOTE — Patient Instructions (Addendum)
  Marissa Huff , Thank you for taking time to come for your Medicare Wellness Visit. I appreciate your ongoing commitment to your health goals. Please review the following plan we discussed and let me know if I can assist you in the future.   Follow up with Dr. Nicki Reaper as needed.    Have a great day!  These are the goals we discussed: Goals    . Increase physical activity     Start Weight Watchers or TOPS program      . Reduce Sugar Intake     Portion control         This is a list of the screening recommended for you and due dates:  Health Maintenance  Topic Date Due  . Tetanus Vaccine  03/09/1952  . Hemoglobin A1C  10/11/2016  . Mammogram  10/22/2016  . Eye exam for diabetics  03/23/2017  . Complete foot exam   04/13/2017  . Flu Shot  Completed  . DEXA scan (bone density measurement)  Completed  . Pneumonia vaccines  Completed

## 2017-01-19 NOTE — Progress Notes (Signed)
I have reviewed the above note and agree.  Tiffini Blacksher, M.D.  

## 2017-01-23 DIAGNOSIS — J301 Allergic rhinitis due to pollen: Secondary | ICD-10-CM | POA: Diagnosis not present

## 2017-01-30 DIAGNOSIS — J301 Allergic rhinitis due to pollen: Secondary | ICD-10-CM | POA: Diagnosis not present

## 2017-02-05 ENCOUNTER — Other Ambulatory Visit: Payer: Self-pay | Admitting: Internal Medicine

## 2017-02-06 DIAGNOSIS — J301 Allergic rhinitis due to pollen: Secondary | ICD-10-CM | POA: Diagnosis not present

## 2017-02-13 ENCOUNTER — Other Ambulatory Visit: Payer: Self-pay | Admitting: Internal Medicine

## 2017-02-13 DIAGNOSIS — Z1231 Encounter for screening mammogram for malignant neoplasm of breast: Secondary | ICD-10-CM

## 2017-02-13 DIAGNOSIS — J301 Allergic rhinitis due to pollen: Secondary | ICD-10-CM | POA: Diagnosis not present

## 2017-02-20 DIAGNOSIS — J301 Allergic rhinitis due to pollen: Secondary | ICD-10-CM | POA: Diagnosis not present

## 2017-02-21 ENCOUNTER — Ambulatory Visit
Admission: RE | Admit: 2017-02-21 | Discharge: 2017-02-21 | Disposition: A | Discharge status: Home / Self Care | Payer: PPO | Source: Ambulatory Visit | Attending: Internal Medicine | Admitting: Internal Medicine

## 2017-02-21 DIAGNOSIS — Z1231 Encounter for screening mammogram for malignant neoplasm of breast: Secondary | ICD-10-CM | POA: Insufficient documentation

## 2017-02-27 DIAGNOSIS — J301 Allergic rhinitis due to pollen: Secondary | ICD-10-CM | POA: Diagnosis not present

## 2017-03-06 DIAGNOSIS — J301 Allergic rhinitis due to pollen: Secondary | ICD-10-CM | POA: Diagnosis not present

## 2017-03-13 DIAGNOSIS — J301 Allergic rhinitis due to pollen: Secondary | ICD-10-CM | POA: Diagnosis not present

## 2017-03-16 DIAGNOSIS — L0109 Other impetigo: Secondary | ICD-10-CM | POA: Diagnosis not present

## 2017-03-16 DIAGNOSIS — L821 Other seborrheic keratosis: Secondary | ICD-10-CM | POA: Diagnosis not present

## 2017-03-16 DIAGNOSIS — L218 Other seborrheic dermatitis: Secondary | ICD-10-CM | POA: Diagnosis not present

## 2017-03-16 DIAGNOSIS — I781 Nevus, non-neoplastic: Secondary | ICD-10-CM | POA: Diagnosis not present

## 2017-03-20 DIAGNOSIS — J301 Allergic rhinitis due to pollen: Secondary | ICD-10-CM | POA: Diagnosis not present

## 2017-03-24 DIAGNOSIS — H353132 Nonexudative age-related macular degeneration, bilateral, intermediate dry stage: Secondary | ICD-10-CM | POA: Diagnosis not present

## 2017-03-24 LAB — HM DIABETES EYE EXAM

## 2017-03-27 DIAGNOSIS — J301 Allergic rhinitis due to pollen: Secondary | ICD-10-CM | POA: Diagnosis not present

## 2017-04-03 DIAGNOSIS — J301 Allergic rhinitis due to pollen: Secondary | ICD-10-CM | POA: Diagnosis not present

## 2017-04-07 ENCOUNTER — Telehealth: Payer: Self-pay

## 2017-04-07 NOTE — Telephone Encounter (Signed)
Entered in error

## 2017-04-10 DIAGNOSIS — J301 Allergic rhinitis due to pollen: Secondary | ICD-10-CM | POA: Diagnosis not present

## 2017-04-11 DIAGNOSIS — Z794 Long term (current) use of insulin: Secondary | ICD-10-CM | POA: Diagnosis not present

## 2017-04-11 DIAGNOSIS — E10649 Type 1 diabetes mellitus with hypoglycemia without coma: Secondary | ICD-10-CM | POA: Diagnosis not present

## 2017-04-11 DIAGNOSIS — E11649 Type 2 diabetes mellitus with hypoglycemia without coma: Secondary | ICD-10-CM | POA: Diagnosis not present

## 2017-04-11 DIAGNOSIS — E039 Hypothyroidism, unspecified: Secondary | ICD-10-CM | POA: Diagnosis not present

## 2017-04-11 LAB — HEMOGLOBIN A1C: Hemoglobin A1C: 7.6

## 2017-04-12 ENCOUNTER — Other Ambulatory Visit: Payer: Self-pay | Admitting: Internal Medicine

## 2017-04-17 DIAGNOSIS — J301 Allergic rhinitis due to pollen: Secondary | ICD-10-CM | POA: Diagnosis not present

## 2017-04-24 DIAGNOSIS — J301 Allergic rhinitis due to pollen: Secondary | ICD-10-CM | POA: Diagnosis not present

## 2017-05-01 DIAGNOSIS — J301 Allergic rhinitis due to pollen: Secondary | ICD-10-CM | POA: Diagnosis not present

## 2017-05-02 ENCOUNTER — Other Ambulatory Visit (INDEPENDENT_AMBULATORY_CARE_PROVIDER_SITE_OTHER): Payer: PPO

## 2017-05-02 DIAGNOSIS — E7800 Pure hypercholesterolemia, unspecified: Secondary | ICD-10-CM | POA: Diagnosis not present

## 2017-05-02 DIAGNOSIS — I10 Essential (primary) hypertension: Secondary | ICD-10-CM | POA: Diagnosis not present

## 2017-05-02 DIAGNOSIS — Z794 Long term (current) use of insulin: Secondary | ICD-10-CM

## 2017-05-02 DIAGNOSIS — E1165 Type 2 diabetes mellitus with hyperglycemia: Secondary | ICD-10-CM

## 2017-05-02 DIAGNOSIS — IMO0001 Reserved for inherently not codable concepts without codable children: Secondary | ICD-10-CM

## 2017-05-02 LAB — BASIC METABOLIC PANEL
BUN: 20 mg/dL (ref 6–23)
CO2: 27 mEq/L (ref 19–32)
Calcium: 9.5 mg/dL (ref 8.4–10.5)
Chloride: 103 mEq/L (ref 96–112)
Creatinine, Ser: 0.79 mg/dL (ref 0.40–1.20)
GFR: 73.67 mL/min (ref >60.00)
Glucose, Bld: 203 mg/dL — ABNORMAL HIGH (ref 70–99)
Potassium: 4.4 mEq/L (ref 3.5–5.1)
Sodium: 139 mEq/L (ref 135–145)

## 2017-05-02 LAB — CBC WITH DIFFERENTIAL/PLATELET
Basophils Absolute: 0 10*3/uL (ref 0.0–0.1)
Basophils Relative: 0.9 % (ref 0.0–3.0)
Eosinophils Absolute: 0.2 10*3/uL (ref 0.0–0.7)
Eosinophils Relative: 3.9 % (ref 0.0–5.0)
HCT: 34.9 % — ABNORMAL LOW (ref 36.0–46.0)
Hemoglobin: 12 g/dL (ref 12.0–15.0)
Lymphocytes Relative: 24.4 % (ref 12.0–46.0)
Lymphs Abs: 1.1 10*3/uL (ref 0.7–4.0)
MCHC: 34.5 g/dL (ref 30.0–36.0)
MCV: 91.2 fl (ref 78.0–100.0)
Monocytes Absolute: 0.5 10*3/uL (ref 0.1–1.0)
Monocytes Relative: 11.1 % (ref 3.0–12.0)
Neutro Abs: 2.7 10*3/uL (ref 1.4–7.7)
Neutrophils Relative %: 59.7 % (ref 43.0–77.0)
Platelets: 221 10*3/uL (ref 150.0–400.0)
RBC: 3.83 Mil/uL — ABNORMAL LOW (ref 3.87–5.11)
RDW: 13.9 % (ref 11.5–15.5)
WBC: 4.6 10*3/uL (ref 4.0–10.5)

## 2017-05-02 LAB — HEPATIC FUNCTION PANEL
ALT: 16 U/L (ref 0–35)
AST: 15 U/L (ref 0–37)
Albumin: 3.9 g/dL (ref 3.5–5.2)
Alkaline Phosphatase: 65 U/L (ref 39–117)
Bilirubin, Direct: 0.1 mg/dL (ref 0.0–0.3)
Total Bilirubin: 0.5 mg/dL (ref 0.2–1.2)
Total Protein: 6.7 g/dL (ref 6.0–8.3)

## 2017-05-02 LAB — LIPID PANEL
Cholesterol: 148 mg/dL (ref 0–200)
HDL: 46.8 mg/dL (ref >39.00)
LDL Cholesterol: 84 mg/dL (ref 0–99)
NonHDL: 100.92
Total CHOL/HDL Ratio: 3
Triglycerides: 85 mg/dL (ref 0.0–149.0)
VLDL: 17 mg/dL (ref 0.0–40.0)

## 2017-05-02 LAB — TSH: TSH: 4.16 u[IU]/mL (ref 0.35–4.50)

## 2017-05-02 NOTE — Addendum Note (Signed)
Addended by: Leeanne Rio on: 05/02/2017 09:18 AM   Modules accepted: Orders

## 2017-05-03 ENCOUNTER — Encounter: Payer: Self-pay | Admitting: Internal Medicine

## 2017-05-04 DIAGNOSIS — M25542 Pain in joints of left hand: Secondary | ICD-10-CM | POA: Diagnosis not present

## 2017-05-04 DIAGNOSIS — M25562 Pain in left knee: Secondary | ICD-10-CM | POA: Diagnosis not present

## 2017-05-04 DIAGNOSIS — M1612 Unilateral primary osteoarthritis, left hip: Secondary | ICD-10-CM | POA: Diagnosis not present

## 2017-05-04 DIAGNOSIS — M25552 Pain in left hip: Secondary | ICD-10-CM | POA: Diagnosis not present

## 2017-05-05 ENCOUNTER — Ambulatory Visit (INDEPENDENT_AMBULATORY_CARE_PROVIDER_SITE_OTHER): Payer: PPO | Admitting: Internal Medicine

## 2017-05-05 DIAGNOSIS — Z9109 Other allergy status, other than to drugs and biological substances: Secondary | ICD-10-CM

## 2017-05-05 DIAGNOSIS — K3184 Gastroparesis: Secondary | ICD-10-CM

## 2017-05-05 DIAGNOSIS — M4800 Spinal stenosis, site unspecified: Secondary | ICD-10-CM

## 2017-05-05 DIAGNOSIS — E7800 Pure hypercholesterolemia, unspecified: Secondary | ICD-10-CM | POA: Diagnosis not present

## 2017-05-05 DIAGNOSIS — E669 Obesity, unspecified: Secondary | ICD-10-CM

## 2017-05-05 DIAGNOSIS — E1165 Type 2 diabetes mellitus with hyperglycemia: Secondary | ICD-10-CM | POA: Diagnosis not present

## 2017-05-05 DIAGNOSIS — E038 Other specified hypothyroidism: Secondary | ICD-10-CM

## 2017-05-05 DIAGNOSIS — K219 Gastro-esophageal reflux disease without esophagitis: Secondary | ICD-10-CM | POA: Diagnosis not present

## 2017-05-05 DIAGNOSIS — I10 Essential (primary) hypertension: Secondary | ICD-10-CM

## 2017-05-05 LAB — HM DIABETES FOOT EXAM

## 2017-05-05 MED ORDER — LORAZEPAM 0.5 MG PO TABS: 0.2500 mg | ORAL_TABLET | Freq: Every day | ORAL | 0 refills | Status: DC | PRN

## 2017-05-05 NOTE — Progress Notes (Signed)
Patient ID: Marissa Huff, female   DOB: 03/22/1931, 84 y.o.   MRN: [id number]   Subjective:    Patient ID: Marissa Huff, female    DOB: [birth year], 84 y.o.   MRN: [id number]  HPI  Patient here for a scheduled follow up.  She is feeling better.  Saw ortho yesterday.  S/p injection in her knee.  Is feeling better.  Breathing stable.  No chest pain.  No acid reflux.  No abdominal pain.  Just saw Dr Eddie Dibbles 04/11/17.  a1c 7.6.  No chest pain.  No increased sob.  Overall feels good.  No acid reflux.  No abdominal pain.  Bowels moving.  Discussed shingrix.     Past Medical History:  Diagnosis Date  . Allergy   . Arthritis   . Carpal tunnel syndrome, bilateral   . Diabetes mellitus without complication ([place])   . Diverticulosis   . 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 Gastroparesis   . GERD (gastroesophageal reflux disease)   . Hypertension   . Hypothyroidism   . PUD (peptic ulcer disease)   . Skin cancer   . Spinal stenosis    lumbar laminectomy   Past Surgical History:  Procedure Laterality Date  . ABDOMINAL HYSTERECTOMY    . antrectomy and vagotomy  1994  . arthroscopic left knee  12/05/08  . BACK SURGERY    . carpal tunnel repai  10/10   left   . CATARACT EXTRACTION, BILATERAL    . EYE SURGERY     bilateral cataracts  . 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 LAPAROSCOPIC CHOLECYSTECTOMY  6/01  . LUMBAR LAMINECTOMY  1999  . LUMBAR LAMINECTOMY WITH COFLEX 1 LEVEL N/A 09/15/2015   Procedure: Lumbar one-two Laminectomy with placement of coflex;  Surgeon: Kristeen Miss, MD;  Location: [facility];  Service: Neurosurgery;  Laterality: N/A;  . PARTIAL HYSTERECTOMY  1960   fibroid tumors, ovaries not removed  . tonsillectomy    . TONSILLECTOMY     Family History  Problem Relation Age of Onset  . Stroke Mother   . Stroke Father   . Hypertension Father   . Diabetes Brother   . Breast cancer Daughter 70  . Colon cancer Neg Hx    Social History   Socioeconomic History  . Marital status:  Married    Spouse name: Not on file  . Number of children: Not on file  . Years of education: Not on file  . Highest education level: Not on file  Occupational History  . Not on file  Social Needs  . Financial resource strain: Not on file  . Food insecurity:    Worry: Not on file    Inability: Not on file  . Transportation needs:    Medical: Not on file    Non-medical: Not on file  Tobacco Use  . Smoking status: Never Smoker  . Smokeless tobacco: Never Used  Substance and Sexual Activity  . Alcohol use: No    Alcohol/week: 0.0 oz  . Drug use: No  . Sexual activity: Never  Lifestyle  . Physical activity:    Days per week: Not on file    Minutes per session: Not on file  . Stress: Not on file  Relationships  . Social connections:    Talks on phone: Not on file    Gets together: Not on file    Attends religious service: Not on file    Active member of club or organization:  Not on file    Attends meetings of clubs or organizations: Not on file    Relationship status: Not on file  Other Topics Concern  . Not on file  Social History Narrative  . Not on file    Outpatient Encounter Medications as of 05/05/2017  Medication Sig  . albuterol (PROVENTIL HFA;VENTOLIN HFA) 108 (90 BASE) MCG/ACT inhaler Inhale 2 puffs into the lungs every 6 (six) hours as needed for wheezing or shortness of breath.  Marland Kitchen amLODipine (NORVASC) 5 MG tablet TAKE 1 TABLET (5 MG TOTAL) BY MOUTH DAILY.  Marland Kitchen aspirin 81 MG tablet Take 81 mg by mouth daily.  . blood glucose meter kit and supplies KIT Dispense based on patient and insurance preference. Use up to four times daily as directed. (FOR ICD-10:E11.65)  . Calcium Citrate-Vitamin D (CALCIUM + D PO) Take 1 tablet by mouth daily.  . fexofenadine (ALLEGRA) 180 MG tablet Take 180 mg by mouth daily.  . fluticasone (FLONASE) 50 MCG/ACT nasal spray Place 2 sprays into both nostrils daily.  Marland Kitchen glucagon (GLUCAGON EMERGENCY) 1 MG injection Inject 1 mg into the muscle  once as needed.  Marland Kitchen glucose blood (ONE TOUCH ULTRA TEST) test strip 1 each by Other route See admin instructions. Patient to check four times a day Dx: E11.65  . insulin lispro (HUMALOG KWIKPEN) 100 UNIT/ML KiwkPen Inject on a sliding scale between 14-28 units with each meal. Max daily dose 90 (Patient taking differently: Inject 6-11 Units into the skin 3 (three) times daily. Per sliding scale)  . Lancets (ACCU-CHEK SOFT TOUCH) lancets 1 each by Other route See admin instructions. Check blood sugar 4 times daily  . LANTUS SOLOSTAR 100 UNIT/ML Solostar Pen INJECT 24UNITS AT BEDTIME  . levothyroxine (SYNTHROID, LEVOTHROID) 150 MCG tablet TAKE 1 TABLET (150 MCG TOTAL) BY MOUTH DAILY.  Marland Kitchen LORazepam (ATIVAN) 0.5 MG tablet Take 0.5-1 tablets (0.25-0.5 mg total) by mouth daily as needed for anxiety.  . metFORMIN (GLUCOPHAGE-XR) 500 MG 24 hr tablet TAKE 1 TABLET (500 MG TOTAL) BY MOUTH 2 (TWO) TIMES DAILY.  . Multiple Vitamins-Minerals (MULTIVITAMIN PO) Take 1 tablet by mouth daily.   . pantoprazole (PROTONIX) 40 MG tablet TAKE 1 TABLET BY MOUTH TWICE A DAY  . traMADol (ULTRAM) 50 MG tablet Take 1 tablet (50 mg total) by mouth 2 (two) times daily as needed for moderate pain. (Patient not taking: Reported on 01/18/2017)  . valsartan-hydrochlorothiazide (DIOVAN-HCT) 160-12.5 MG tablet TAKE 1 TABLET BY MOUTH DAILY.  . [DISCONTINUED] insulin glargine (LANTUS) 100 UNIT/ML injection Inject 0.33 mLs (33 Units total) into the skin at bedtime. Pens please  - SOLOSTAR (Patient taking differently: Inject 24 Units into the skin at bedtime. Pens please  - SOLOSTAR)  . [DISCONTINUED] LORazepam (ATIVAN) 0.5 MG tablet Take 0.5-1 tablets (0.25-0.5 mg total) by mouth daily as needed for anxiety.  . [DISCONTINUED] nitrofurantoin, macrocrystal-monohydrate, (MACROBID) 100 MG capsule Take 1 capsule (100 mg total) by mouth 2 (two) times daily.   No facility-administered encounter medications on file as of 05/05/2017.     Review of  Systems  Constitutional: Negative for appetite change and unexpected weight change.  HENT: Negative for congestion and sinus pressure.   Respiratory: Negative for cough, chest tightness and shortness of breath.   Cardiovascular: Negative for chest pain, palpitations and leg swelling.  Gastrointestinal: Negative for abdominal pain, diarrhea, nausea and vomiting.  Genitourinary: Negative for difficulty urinating and dysuria.  Musculoskeletal: Negative for myalgias.       Knee doing  better s/p injection.   Skin: Negative for color change and rash.  Neurological: Negative for dizziness, light-headedness and headaches.  Psychiatric/Behavioral: Negative for agitation and dysphoric mood.       Objective:     Blood pressure rechecked by me:  130/64  Physical Exam  Constitutional: She appears well-developed and well-nourished. No distress.  HENT:  Nose: Nose normal.  Mouth/Throat: Oropharynx is clear and moist.  Neck: Neck supple. No thyromegaly present.  Cardiovascular: Normal rate and regular rhythm.  Pulmonary/Chest: Breath sounds normal. No respiratory distress. She has no wheezes.  Abdominal: Soft. Bowel sounds are normal. There is no tenderness.  Musculoskeletal: She exhibits no edema or tenderness.  Feet:  No lesion.  Intact to light touch and pin prick.    Lymphadenopathy:    She has no cervical adenopathy.  Skin: No erythema.  Psychiatric: She has a normal mood and affect. Her behavior is normal.    BP 122/64 (BP Location: Left Arm, Patient Position: Sitting, Cuff Size: Normal)   Pulse 78   Temp 98.1 F (36.7 C) (Oral)   Resp 18   Wt 190 lb (86.2 kg)   SpO2 98%   BMI 33.13 kg/m  Wt Readings from Last 3 Encounters:  05/05/17 190 lb (86.2 kg)  01/18/17 189 lb 12.8 oz (86.1 kg)  01/05/17 189 lb 3.2 oz (85.8 kg)     Lab Results  Component Value Date   WBC 4.6 05/02/2017   HGB 12.0 05/02/2017   HCT 34.9 (L) 05/02/2017   PLT 221.0 05/02/2017   GLUCOSE 203 (H)  05/02/2017   CHOL 148 05/02/2017   TRIG 85.0 05/02/2017   HDL 46.80 05/02/2017   LDLCALC 84 05/02/2017   ALT 16 05/02/2017   AST 15 05/02/2017   NA 139 05/02/2017   K 4.4 05/02/2017   CL 103 05/02/2017   CREATININE 0.79 05/02/2017   BUN 20 05/02/2017   CO2 27 05/02/2017   TSH 4.16 05/02/2017   HGBA1C 7.6 04/11/2017   MICROALBUR 4.1 (H) 01/04/2017    Mm Screening Breast Tomo Bilateral  Result Date: 02/21/2017 CLINICAL DATA:  Screening. EXAM: DIGITAL SCREENING BILATERAL MAMMOGRAM WITH TOMO AND CAD COMPARISON:  Previous exam(s). ACR Breast Density Category b: There are scattered areas of fibroglandular density. FINDINGS: There are no findings suspicious for malignancy. Images were processed with CAD. IMPRESSION: No mammographic evidence of malignancy. A result letter of this screening mammogram will be mailed directly to the patient. RECOMMENDATION: Screening mammogram in one year. (Code:SM-B-01Y) BI-RADS CATEGORY  1: Negative. Electronically Signed   By: Everlean Alstrom M.D.   On: 02/21/2017 12:04       Assessment & Plan:   Problem List Items Addressed This Visit    Environmental allergies    Sees Dr Kathyrn Sheriff.  Currently doing well on current regimen.        Gastroparesis    Followed by GI.        GERD (gastroesophageal reflux disease)    Controlled on protonix.        Hypercholesterolemia    Low cholesterol diet and exercise.  Follow lipid panel.        Relevant Orders   Hepatic function panel   Lipid panel   Hypertension    Blood pressure under good control.  Continue same medication regimen.  Follow pressures.  Follow metabolic panel.        Relevant Orders   Basic metabolic panel   Hypothyroidism    On thyroid replacement.  Follow  tsh.       Obesity (BMI 30-39.9)    Discussed diet and exercise.  Follow.        Relevant Medications   LANTUS SOLOSTAR 100 UNIT/ML Solostar Pen   Spinal stenosis    S/p surgery.  Not taking tramadol.  Follow.        Type  2 diabetes mellitus, uncontrolled ([facility])    Low carb diet and exercise.  Follow met b and a1c.        Relevant Medications   LANTUS SOLOSTAR 100 UNIT/ML Solostar Pen       Einar Pheasant, MD

## 2017-05-08 ENCOUNTER — Encounter: Payer: Self-pay | Admitting: Internal Medicine

## 2017-05-08 DIAGNOSIS — J301 Allergic rhinitis due to pollen: Secondary | ICD-10-CM | POA: Diagnosis not present

## 2017-05-08 NOTE — Assessment & Plan Note (Signed)
Blood pressure under good control.  Continue same medication regimen.  Follow pressures.  Follow metabolic panel.   

## 2017-05-08 NOTE — Assessment & Plan Note (Signed)
Controlled on protonix.   

## 2017-05-08 NOTE — Assessment & Plan Note (Signed)
Low carb diet and exercise.  Follow met b and a1c.   

## 2017-05-08 NOTE — Assessment & Plan Note (Signed)
Discussed diet and exercise.  Follow.  

## 2017-05-08 NOTE — Assessment & Plan Note (Signed)
Sees Dr Kathyrn Sheriff.  Currently doing well on current regimen.

## 2017-05-08 NOTE — Assessment & Plan Note (Signed)
Low cholesterol diet and exercise.  Follow lipid panel.   

## 2017-05-08 NOTE — Assessment & Plan Note (Signed)
On thyroid replacement.  Follow tsh.  

## 2017-05-08 NOTE — Assessment & Plan Note (Signed)
Followed by GI

## 2017-05-08 NOTE — Assessment & Plan Note (Signed)
S/p surgery.  Not taking tramadol.  Follow.

## 2017-05-15 DIAGNOSIS — J301 Allergic rhinitis due to pollen: Secondary | ICD-10-CM | POA: Diagnosis not present

## 2017-05-22 DIAGNOSIS — J301 Allergic rhinitis due to pollen: Secondary | ICD-10-CM | POA: Diagnosis not present

## 2017-05-25 ENCOUNTER — Other Ambulatory Visit: Payer: Self-pay | Admitting: Internal Medicine

## 2017-06-01 DIAGNOSIS — J301 Allergic rhinitis due to pollen: Secondary | ICD-10-CM | POA: Diagnosis not present

## 2017-06-05 DIAGNOSIS — J301 Allergic rhinitis due to pollen: Secondary | ICD-10-CM | POA: Diagnosis not present

## 2017-06-12 DIAGNOSIS — J301 Allergic rhinitis due to pollen: Secondary | ICD-10-CM | POA: Diagnosis not present

## 2017-06-16 ENCOUNTER — Other Ambulatory Visit: Payer: Self-pay | Admitting: Internal Medicine

## 2017-06-19 DIAGNOSIS — J301 Allergic rhinitis due to pollen: Secondary | ICD-10-CM | POA: Diagnosis not present

## 2017-06-21 DIAGNOSIS — R490 Dysphonia: Secondary | ICD-10-CM | POA: Diagnosis not present

## 2017-06-21 DIAGNOSIS — J301 Allergic rhinitis due to pollen: Secondary | ICD-10-CM | POA: Diagnosis not present

## 2017-06-21 DIAGNOSIS — H903 Sensorineural hearing loss, bilateral: Secondary | ICD-10-CM | POA: Diagnosis not present

## 2017-06-22 DIAGNOSIS — J301 Allergic rhinitis due to pollen: Secondary | ICD-10-CM | POA: Diagnosis not present

## 2017-06-26 DIAGNOSIS — J301 Allergic rhinitis due to pollen: Secondary | ICD-10-CM | POA: Diagnosis not present

## 2017-07-03 DIAGNOSIS — J301 Allergic rhinitis due to pollen: Secondary | ICD-10-CM | POA: Diagnosis not present

## 2017-07-10 DIAGNOSIS — J301 Allergic rhinitis due to pollen: Secondary | ICD-10-CM | POA: Diagnosis not present

## 2017-07-17 DIAGNOSIS — J301 Allergic rhinitis due to pollen: Secondary | ICD-10-CM | POA: Diagnosis not present

## 2017-07-31 DIAGNOSIS — J301 Allergic rhinitis due to pollen: Secondary | ICD-10-CM | POA: Diagnosis not present

## 2017-08-08 ENCOUNTER — Other Ambulatory Visit: Payer: Self-pay | Admitting: Internal Medicine

## 2017-08-14 DIAGNOSIS — J301 Allergic rhinitis due to pollen: Secondary | ICD-10-CM | POA: Diagnosis not present

## 2017-08-21 DIAGNOSIS — J301 Allergic rhinitis due to pollen: Secondary | ICD-10-CM | POA: Diagnosis not present

## 2017-08-28 DIAGNOSIS — J301 Allergic rhinitis due to pollen: Secondary | ICD-10-CM | POA: Diagnosis not present

## 2017-09-10 ENCOUNTER — Other Ambulatory Visit: Payer: Self-pay | Admitting: Internal Medicine

## 2017-09-10 DIAGNOSIS — Z760 Encounter for issue of repeat prescription: Secondary | ICD-10-CM

## 2017-09-11 DIAGNOSIS — J301 Allergic rhinitis due to pollen: Secondary | ICD-10-CM | POA: Diagnosis not present

## 2017-09-13 DIAGNOSIS — J301 Allergic rhinitis due to pollen: Secondary | ICD-10-CM | POA: Diagnosis not present

## 2017-09-18 ENCOUNTER — Other Ambulatory Visit (INDEPENDENT_AMBULATORY_CARE_PROVIDER_SITE_OTHER): Payer: PPO

## 2017-09-18 DIAGNOSIS — E7800 Pure hypercholesterolemia, unspecified: Secondary | ICD-10-CM | POA: Diagnosis not present

## 2017-09-18 DIAGNOSIS — J301 Allergic rhinitis due to pollen: Secondary | ICD-10-CM | POA: Diagnosis not present

## 2017-09-18 DIAGNOSIS — I10 Essential (primary) hypertension: Secondary | ICD-10-CM

## 2017-09-18 LAB — BASIC METABOLIC PANEL
BUN: 25 mg/dL — ABNORMAL HIGH (ref 6–23)
CO2: 30 mEq/L (ref 19–32)
Calcium: 9.6 mg/dL (ref 8.4–10.5)
Chloride: 103 mEq/L (ref 96–112)
Creatinine, Ser: 0.86 mg/dL (ref 0.40–1.20)
GFR: 66.73 mL/min (ref >60.00)
Glucose, Bld: 275 mg/dL — ABNORMAL HIGH (ref 70–99)
Potassium: 4.2 mEq/L (ref 3.5–5.1)
Sodium: 139 mEq/L (ref 135–145)

## 2017-09-18 LAB — LIPID PANEL
Cholesterol: 150 mg/dL (ref 0–200)
HDL: 44.5 mg/dL (ref >39.00)
LDL Cholesterol: 88 mg/dL (ref 0–99)
NonHDL: 105.38
Total CHOL/HDL Ratio: 3
Triglycerides: 86 mg/dL (ref 0.0–149.0)
VLDL: 17.2 mg/dL (ref 0.0–40.0)

## 2017-09-18 LAB — HEPATIC FUNCTION PANEL
ALT: 17 U/L (ref 0–35)
AST: 16 U/L (ref 0–37)
Albumin: 4.1 g/dL (ref 3.5–5.2)
Alkaline Phosphatase: 71 U/L (ref 39–117)
Bilirubin, Direct: 0.1 mg/dL (ref 0.0–0.3)
Total Bilirubin: 0.5 mg/dL (ref 0.2–1.2)
Total Protein: 7 g/dL (ref 6.0–8.3)

## 2017-09-19 ENCOUNTER — Encounter: Payer: Self-pay | Admitting: Internal Medicine

## 2017-09-19 ENCOUNTER — Ambulatory Visit (INDEPENDENT_AMBULATORY_CARE_PROVIDER_SITE_OTHER): Payer: PPO | Admitting: Internal Medicine

## 2017-09-19 DIAGNOSIS — Z23 Encounter for immunization: Secondary | ICD-10-CM

## 2017-09-19 DIAGNOSIS — K279 Peptic ulcer, site unspecified, unspecified as acute or chronic, without hemorrhage or perforation: Secondary | ICD-10-CM | POA: Diagnosis not present

## 2017-09-19 DIAGNOSIS — M4800 Spinal stenosis, site unspecified: Secondary | ICD-10-CM

## 2017-09-19 DIAGNOSIS — E038 Other specified hypothyroidism: Secondary | ICD-10-CM | POA: Diagnosis not present

## 2017-09-19 DIAGNOSIS — Z8579 Personal history of other malignant neoplasms of lymphoid, hematopoietic and related tissues: Secondary | ICD-10-CM | POA: Diagnosis not present

## 2017-09-19 DIAGNOSIS — E669 Obesity, unspecified: Secondary | ICD-10-CM

## 2017-09-19 DIAGNOSIS — E1165 Type 2 diabetes mellitus with hyperglycemia: Secondary | ICD-10-CM

## 2017-09-19 DIAGNOSIS — I10 Essential (primary) hypertension: Secondary | ICD-10-CM

## 2017-09-19 DIAGNOSIS — K219 Gastro-esophageal reflux disease without esophagitis: Secondary | ICD-10-CM

## 2017-09-19 DIAGNOSIS — E7800 Pure hypercholesterolemia, unspecified: Secondary | ICD-10-CM

## 2017-09-19 NOTE — Progress Notes (Signed)
Patient ID: WESLEE PRESTAGE, female   DOB: 19-May-1931, 84 y.o.   MRN: [id number]   Subjective:    Patient ID: [name], female    DOB: Feb 26, 1931, 84 y.o.   MRN: [id number]  HPI  Patient here for a scheduled follow up.  States she is doing relatively well.  Brought in no recorded sugar readings.  Some fasting sugars in 200 range.  Has been seeing Dr Eddie Dibbles.  Due to see Dr Honor Junes soon.  Discussed diet and exercise.  No chest pain.  Breathing stable.  Does report some pain intermittently in her stomach.  On protonix.  Still with some intermittent discomfort.  No vomiting.  Bowels moving.  Left knee better.  Has seen ortho.  Some discomfort left lower back.  Persistent.  Has seen Dr Sharlet Salina.  Request referral back.  No numbness or tingling.  S/p back surgery 2017.  No taking tramadol.  Takes tylenol.     Past Medical History:  Diagnosis Date  . Allergy   . Arthritis   . Carpal tunnel syndrome, bilateral   . Diabetes mellitus without complication ([place])   . Diverticulosis   . 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 Gastroparesis   . GERD (gastroesophageal reflux disease)   . Hypertension   . Hypothyroidism   . PUD (peptic ulcer disease)   . Skin cancer   . Spinal stenosis    lumbar laminectomy   Past Surgical History:  Procedure Laterality Date  . ABDOMINAL HYSTERECTOMY    . antrectomy and vagotomy  1994  . arthroscopic left knee  12/05/08  . BACK SURGERY    . carpal tunnel repai  10/10   left   . CATARACT EXTRACTION, BILATERAL    . EYE SURGERY     bilateral cataracts  . 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 LAPAROSCOPIC CHOLECYSTECTOMY  6/01  . LUMBAR LAMINECTOMY  1999  . LUMBAR LAMINECTOMY WITH COFLEX 1 LEVEL N/A 09/15/2015   Procedure: Lumbar one-two Laminectomy with placement of coflex;  Surgeon: Kristeen Miss, MD;  Location: [facility];  Service: Neurosurgery;  Laterality: N/A;  . PARTIAL HYSTERECTOMY  1960   fibroid tumors, ovaries not removed  . tonsillectomy    . TONSILLECTOMY      Family History  Problem Relation Age of Onset  . Stroke Mother   . Stroke Father   . Hypertension Father   . Diabetes Brother   . Breast cancer Daughter 52  . Colon cancer Neg Hx    Social History   Socioeconomic History  . Marital status: Married    Spouse name: Not on file  . Number of children: Not on file  . Years of education: Not on file  . Highest education level: Not on file  Occupational History  . Not on file  Social Needs  . Financial resource strain: Not on file  . Food insecurity:    Worry: Not on file    Inability: Not on file  . Transportation needs:    Medical: Not on file    Non-medical: Not on file  Tobacco Use  . Smoking status: Never Smoker  . Smokeless tobacco: Never Used  Substance and Sexual Activity  . Alcohol use: No    Alcohol/week: 0.0 standard drinks  . Drug use: No  . Sexual activity: Never  Lifestyle  . Physical activity:    Days per week: Not on file    Minutes per session: Not on file  . Stress:  Not on file  Relationships  . Social connections:    Talks on phone: Not on file    Gets together: Not on file    Attends religious service: Not on file    Active member of club or organization: Not on file    Attends meetings of clubs or organizations: Not on file    Relationship status: Not on file  Other Topics Concern  . Not on file  Social History Narrative  . Not on file    Outpatient Encounter Medications as of 09/19/2017  Medication Sig  . albuterol (PROVENTIL HFA;VENTOLIN HFA) 108 (90 BASE) MCG/ACT inhaler Inhale 2 puffs into the lungs every 6 (six) hours as needed for wheezing or shortness of breath.  Marland Kitchen amLODipine (NORVASC) 5 MG tablet TAKE 1 TABLET (5 MG TOTAL) BY MOUTH DAILY.  Marland Kitchen aspirin 81 MG tablet Take 81 mg by mouth daily.  . blood glucose meter kit and supplies KIT Dispense based on patient and insurance preference. Use up to four times daily as directed. (FOR ICD-10:E11.65)  . Calcium Citrate-Vitamin D (CALCIUM + D  PO) Take 1 tablet by mouth daily.  . fexofenadine (ALLEGRA) 180 MG tablet Take 180 mg by mouth daily.  . fluticasone (FLONASE) 50 MCG/ACT nasal spray Place 2 sprays into both nostrils daily.  Marland Kitchen glucagon (GLUCAGON EMERGENCY) 1 MG injection Inject 1 mg into the muscle once as needed.  . insulin lispro (HUMALOG KWIKPEN) 100 UNIT/ML KiwkPen Inject on a sliding scale between 14-28 units with each meal. Max daily dose 90 (Patient taking differently: Inject 6-11 Units into the skin 3 (three) times daily. Per sliding scale)  . Lancets (ACCU-CHEK SOFT TOUCH) lancets 1 each by Other route See admin instructions. Check blood sugar 4 times daily  . LANTUS SOLOSTAR 100 UNIT/ML Solostar Pen INJECT 24UNITS AT BEDTIME  . levothyroxine (SYNTHROID, LEVOTHROID) 150 MCG tablet TAKE 1 TABLET (150 MCG TOTAL) BY MOUTH DAILY.  Marland Kitchen LORazepam (ATIVAN) 0.5 MG tablet Take 0.5-1 tablets (0.25-0.5 mg total) by mouth daily as needed for anxiety.  . metFORMIN (GLUCOPHAGE-XR) 500 MG 24 hr tablet TAKE 1 TABLET (500 MG TOTAL) BY MOUTH 2 (TWO) TIMES DAILY.  . Multiple Vitamins-Minerals (MULTIVITAMIN PO) Take 1 tablet by mouth daily.   . ONE TOUCH ULTRA TEST test strip USE TO TEST BLOOD SUGAR AS DIRECTED FOUR TIMES DAILY  . pantoprazole (PROTONIX) 40 MG tablet TAKE 1 TABLET BY MOUTH TWICE A DAY  . traMADol (ULTRAM) 50 MG tablet Take 1 tablet (50 mg total) by mouth 2 (two) times daily as needed for moderate pain. (Patient not taking: Reported on 01/18/2017)  . valsartan-hydrochlorothiazide (DIOVAN-HCT) 160-12.5 MG tablet TAKE 1 TABLET BY MOUTH DAILY.   No facility-administered encounter medications on file as of 09/19/2017.     Review of Systems  Constitutional: Negative for appetite change and unexpected weight change.  HENT: Negative for congestion and sinus pressure.   Respiratory: Negative for cough, chest tightness and shortness of breath.   Cardiovascular: Negative for chest pain, palpitations and leg swelling.    Gastrointestinal: Negative for diarrhea, nausea and vomiting.       Some abdominal discomfort as outlined.    Genitourinary: Negative for difficulty urinating and dysuria.  Musculoskeletal: Positive for back pain. Negative for myalgias.       Left knee better.    Skin: Negative for color change and rash.  Neurological: Negative for dizziness, light-headedness and headaches.  Psychiatric/Behavioral: Negative for agitation and dysphoric mood.  Objective:     Blood pressure rechecked by me:  136/70  Physical Exam  Constitutional: She appears well-developed and well-nourished. No distress.  HENT:  Nose: Nose normal.  Mouth/Throat: Oropharynx is clear and moist.  Neck: Neck supple. No thyromegaly present.  Cardiovascular: Normal rate and regular rhythm.  Pulmonary/Chest: Breath sounds normal. No respiratory distress. She has no wheezes.  Abdominal: Soft. Bowel sounds are normal. There is no tenderness.  Musculoskeletal: She exhibits no edema or tenderness.  Lymphadenopathy:    She has no cervical adenopathy.  Skin: No rash noted. No erythema.  Psychiatric: She has a normal mood and affect. Her behavior is normal.    BP 118/60 (BP Location: Left Arm, Patient Position: Sitting, Cuff Size: Large)   Pulse 74   Temp 98 F (36.7 C) (Oral)   Resp 18   Wt 192 lb (87.1 kg)   SpO2 97%   BMI 33.48 kg/m  Wt Readings from Last 3 Encounters:  09/19/17 192 lb (87.1 kg)  05/05/17 190 lb (86.2 kg)  01/18/17 189 lb 12.8 oz (86.1 kg)     Lab Results  Component Value Date   WBC 4.6 05/02/2017   HGB 12.0 05/02/2017   HCT 34.9 (L) 05/02/2017   PLT 221.0 05/02/2017   GLUCOSE 275 (H) 09/18/2017   CHOL 150 09/18/2017   TRIG 86.0 09/18/2017   HDL 44.50 09/18/2017   LDLCALC 88 09/18/2017   ALT 17 09/18/2017   AST 16 09/18/2017   NA 139 09/18/2017   K 4.2 09/18/2017   CL 103 09/18/2017   CREATININE 0.86 09/18/2017   BUN 25 (H) 09/18/2017   CO2 30 09/18/2017   TSH 4.16  05/02/2017   HGBA1C 7.6 04/11/2017   MICROALBUR 4.1 (H) 01/04/2017    Mm Screening Breast Tomo Bilateral  Result Date: 02/21/2017 CLINICAL DATA:  Screening. EXAM: DIGITAL SCREENING BILATERAL MAMMOGRAM WITH TOMO AND CAD COMPARISON:  Previous exam(s). ACR Breast Density Category b: There are scattered areas of fibroglandular density. FINDINGS: There are no findings suspicious for malignancy. Images were processed with CAD. IMPRESSION: No mammographic evidence of malignancy. A result letter of this screening mammogram will be mailed directly to the patient. RECOMMENDATION: Screening mammogram in one year. (Code:SM-B-01Y) BI-RADS CATEGORY  1: Negative. Electronically Signed   By: Everlean Alstrom M.D.   On: 02/21/2017 12:04       Assessment & Plan:   Problem List Items Addressed This Visit    GERD (gastroesophageal reflux disease)    On protonix.        History of lymphoma    History of gastric lymphoma.  Some abdominal discomfort.  On protonix.  Concerned regarding intermittent discomfort.  Refer back to GI.  Question of need for f/u EGD.  Consider scanning if persistent pain.  Pt request referral back to GI.        Relevant Orders   Ambulatory referral to Gastroenterology   Hypercholesterolemia    Low cholesterol diet and exercise.  Follow lipid panel.        Relevant Orders   Hepatic function panel   Lipid panel   Hypertension    Blood pressure under good control.  Continue same medication regimen.  Follow pressures.  Follow metabolic panel.        Hypothyroidism    On thyroid replacement.  Follow tsh.        Relevant Orders   Basic metabolic panel   Obesity (BMI 30-39.9)    Discussed diet and exercise.  Follow.  Peptic ulcer disease    On protonix.  Persistent intermittent abdominal discomfort.  Refer back to GI as outlined.        Relevant Orders   Ambulatory referral to Gastroenterology   Spinal stenosis    S/p surgery.  Increased pain.  Request referral  back to Dr Sharlet Salina.        Relevant Orders   Ambulatory referral to Orthopedic Surgery   Type 2 diabetes mellitus, uncontrolled ([place])    Low carb diet and exercise.  Due to f/u with Dr Honor Junes soon.  Brought in no recorded sugar readings.  Instructed to check and record sugars to take to endocrinology.  Hold on making changes.  They follow a1c.  Discussed keeping up to date with eye checks.        Relevant Orders   Hemoglobin A1c   Microalbumin / creatinine urine ratio    Other Visit Diagnoses    Need for influenza vaccination       Relevant Orders   Flu vaccine HIGH DOSE PF (Fluzone High dose) (Completed)       Einar Pheasant, MD

## 2017-09-21 DIAGNOSIS — E113293 Type 2 diabetes mellitus with mild nonproliferative diabetic retinopathy without macular edema, bilateral: Secondary | ICD-10-CM | POA: Diagnosis not present

## 2017-09-21 LAB — HM DIABETES EYE EXAM

## 2017-09-24 ENCOUNTER — Encounter: Payer: Self-pay | Admitting: Internal Medicine

## 2017-09-24 NOTE — Assessment & Plan Note (Signed)
On protonix

## 2017-09-24 NOTE — Assessment & Plan Note (Signed)
On protonix.  Persistent intermittent abdominal discomfort.  Refer back to GI as outlined.

## 2017-09-24 NOTE — Assessment & Plan Note (Signed)
S/p surgery.  Increased pain.  Request referral back to Dr Sharlet Salina.

## 2017-09-24 NOTE — Assessment & Plan Note (Signed)
Discussed diet and exercise.  Follow.  

## 2017-09-24 NOTE — Assessment & Plan Note (Signed)
Blood pressure under good control.  Continue same medication regimen.  Follow pressures.  Follow metabolic panel.   

## 2017-09-24 NOTE — Assessment & Plan Note (Signed)
On thyroid replacement.  Follow tsh.  

## 2017-09-24 NOTE — Assessment & Plan Note (Signed)
Low cholesterol diet and exercise.  Follow lipid panel.   

## 2017-09-24 NOTE — Assessment & Plan Note (Signed)
History of gastric lymphoma.  Some abdominal discomfort.  On protonix.  Concerned regarding intermittent discomfort.  Refer back to GI.  Question of need for f/u EGD.  Consider scanning if persistent pain.  Pt request referral back to GI.

## 2017-09-24 NOTE — Assessment & Plan Note (Signed)
Low carb diet and exercise.  Due to f/u with Dr Honor Junes soon.  Brought in no recorded sugar readings.  Instructed to check and record sugars to take to endocrinology.  Hold on making changes.  They follow a1c.  Discussed keeping up to date with eye checks.

## 2017-09-25 DIAGNOSIS — J301 Allergic rhinitis due to pollen: Secondary | ICD-10-CM | POA: Diagnosis not present

## 2017-10-02 DIAGNOSIS — R1013 Epigastric pain: Secondary | ICD-10-CM | POA: Diagnosis not present

## 2017-10-02 DIAGNOSIS — K219 Gastro-esophageal reflux disease without esophagitis: Secondary | ICD-10-CM | POA: Diagnosis not present

## 2017-10-02 DIAGNOSIS — Z8572 Personal history of non-Hodgkin lymphomas: Secondary | ICD-10-CM | POA: Diagnosis not present

## 2017-10-03 ENCOUNTER — Other Ambulatory Visit: Payer: Self-pay | Admitting: Internal Medicine

## 2017-10-05 ENCOUNTER — Encounter: Payer: Self-pay | Admitting: Internal Medicine

## 2017-10-09 DIAGNOSIS — I10 Essential (primary) hypertension: Secondary | ICD-10-CM | POA: Diagnosis not present

## 2017-10-09 DIAGNOSIS — E039 Hypothyroidism, unspecified: Secondary | ICD-10-CM | POA: Diagnosis not present

## 2017-10-09 DIAGNOSIS — E1159 Type 2 diabetes mellitus with other circulatory complications: Secondary | ICD-10-CM | POA: Diagnosis not present

## 2017-10-09 DIAGNOSIS — E1029 Type 1 diabetes mellitus with other diabetic kidney complication: Secondary | ICD-10-CM | POA: Diagnosis not present

## 2017-10-09 DIAGNOSIS — R809 Proteinuria, unspecified: Secondary | ICD-10-CM | POA: Diagnosis not present

## 2017-10-09 DIAGNOSIS — J301 Allergic rhinitis due to pollen: Secondary | ICD-10-CM | POA: Diagnosis not present

## 2017-10-09 DIAGNOSIS — E1069 Type 1 diabetes mellitus with other specified complication: Secondary | ICD-10-CM | POA: Diagnosis not present

## 2017-10-16 DIAGNOSIS — J301 Allergic rhinitis due to pollen: Secondary | ICD-10-CM | POA: Diagnosis not present

## 2017-10-23 DIAGNOSIS — J301 Allergic rhinitis due to pollen: Secondary | ICD-10-CM | POA: Diagnosis not present

## 2017-10-30 DIAGNOSIS — J301 Allergic rhinitis due to pollen: Secondary | ICD-10-CM | POA: Diagnosis not present

## 2017-11-06 DIAGNOSIS — J301 Allergic rhinitis due to pollen: Secondary | ICD-10-CM | POA: Diagnosis not present

## 2017-11-13 DIAGNOSIS — J301 Allergic rhinitis due to pollen: Secondary | ICD-10-CM | POA: Diagnosis not present

## 2017-11-20 DIAGNOSIS — J301 Allergic rhinitis due to pollen: Secondary | ICD-10-CM | POA: Diagnosis not present

## 2017-11-27 DIAGNOSIS — J301 Allergic rhinitis due to pollen: Secondary | ICD-10-CM | POA: Diagnosis not present

## 2017-12-04 DIAGNOSIS — J301 Allergic rhinitis due to pollen: Secondary | ICD-10-CM | POA: Diagnosis not present

## 2017-12-06 DIAGNOSIS — J301 Allergic rhinitis due to pollen: Secondary | ICD-10-CM | POA: Diagnosis not present

## 2017-12-08 ENCOUNTER — Other Ambulatory Visit: Payer: Self-pay | Admitting: Internal Medicine

## 2017-12-09 ENCOUNTER — Other Ambulatory Visit: Payer: Self-pay | Admitting: Internal Medicine

## 2017-12-11 DIAGNOSIS — J301 Allergic rhinitis due to pollen: Secondary | ICD-10-CM | POA: Diagnosis not present

## 2017-12-18 DIAGNOSIS — J301 Allergic rhinitis due to pollen: Secondary | ICD-10-CM | POA: Diagnosis not present

## 2017-12-25 DIAGNOSIS — J301 Allergic rhinitis due to pollen: Secondary | ICD-10-CM | POA: Diagnosis not present

## 2018-01-07 ENCOUNTER — Other Ambulatory Visit: Payer: Self-pay | Admitting: Internal Medicine

## 2018-01-09 ENCOUNTER — Other Ambulatory Visit: Payer: Self-pay | Admitting: Internal Medicine

## 2018-01-09 DIAGNOSIS — J301 Allergic rhinitis due to pollen: Secondary | ICD-10-CM | POA: Diagnosis not present

## 2018-01-16 DIAGNOSIS — J301 Allergic rhinitis due to pollen: Secondary | ICD-10-CM | POA: Diagnosis not present

## 2018-01-19 ENCOUNTER — Ambulatory Visit (INDEPENDENT_AMBULATORY_CARE_PROVIDER_SITE_OTHER): Payer: PPO

## 2018-01-19 VITALS — BP 126/72 | HR 67 | Temp 98.2°F | Resp 17 | Ht 64.0 in | Wt 189.4 lb

## 2018-01-19 DIAGNOSIS — Z0000 Encounter for general adult medical examination without abnormal findings: Secondary | ICD-10-CM | POA: Diagnosis not present

## 2018-01-19 NOTE — Patient Instructions (Addendum)
  Marissa Huff , Thank you for taking time to come for your Medicare Wellness Visit. I appreciate your ongoing commitment to your health goals. Please review the following plan we discussed and let me know if I can assist you in the future.   These are the goals we discussed: Goals      Patient Stated   . DIET - INCREASE WATER INTAKE (pt-stated)       This is a list of the screening recommended for you and due dates:  Health Maintenance  Topic Date Due  . Tetanus Vaccine  03/09/1952  . Hemoglobin A1C  10/11/2017  . Mammogram  02/21/2018  . Complete foot exam   05/06/2018  . Eye exam for diabetics  09/22/2018  . Flu Shot  Completed  . DEXA scan (bone density measurement)  Completed  . Pneumonia vaccines  Completed

## 2018-01-19 NOTE — Progress Notes (Signed)
Subjective:   Marissa Huff is a 85 y.o. female who presents for Medicare Annual (Subsequent) preventive examination.  Review of Systems:  No ROS.  Medicare Wellness Visit. Additional risk factors are reflected in the social history.  Cardiac Risk Factors include: advanced age (>38mn, >>58women);diabetes mellitus;hypertension     Objective:     Vitals: BP 126/72 (BP Location: Right Arm, Patient Position: Sitting, Cuff Size: Normal)   Pulse 67   Temp 98.2 F (36.8 C) (Oral)   Resp 17   Ht 5' 4"  (1.626 m)   Wt 189 lb 6.4 oz (85.9 kg)   SpO2 99%   BMI 32.51 kg/m   Body mass index is 32.51 kg/m.  Advanced Directives 01/19/2018 01/18/2017 12/09/2015 09/09/2015 12/09/2014  Does Patient Have a Medical Advance Directive? No No No No No  Does patient want to make changes to medical advance directive? No - Patient declined - - - -  Would patient like information on creating a medical advance directive? - No - Patient declined No - Patient declined No - patient declined information No - patient declined information    Tobacco Social History   Tobacco Use  Smoking Status Never Smoker  Smokeless Tobacco Never Used     Counseling given: Not Answered   Clinical Intake:  Pre-visit preparation completed: Yes  Pain : No/denies pain     Diabetes: Yes(Followed by [facility])  How often do you need to have someone help you when you read instructions, pamphlets, or other written materials from your doctor or pharmacy?: 1 - Never  Interpreter Needed?: No     Past Medical History:  Diagnosis Date  . Allergy   . Arthritis   . Carpal tunnel syndrome, bilateral   . [place]   . Diverticulosis   . [place]   . Gastroparesis   . GERD (gastroesophageal reflux disease)   . Hypertension   . Hypothyroidism   . PUD (peptic ulcer disease)   . Skin cancer   . Spinal stenosis    lumbar laminectomy   Past Surgical  History:  Procedure Laterality Date  . ABDOMINAL HYSTERECTOMY    . antrectomy and vagotomy  1994  . arthroscopic left knee  12/05/08  . BACK SURGERY    . carpal tunnel repai  10/10   left   . CATARACT EXTRACTION, BILATERAL    . EYE SURGERY     bilateral cataracts  . 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 LAPAROSCOPIC CHOLECYSTECTOMY  6/01  . LUMBAR LAMINECTOMY  1999  . LUMBAR LAMINECTOMY WITH COFLEX 1 LEVEL N/A 09/15/2015   Procedure: Lumbar one-two Laminectomy with placement of coflex;  Surgeon: HKristeen Miss MD;  [facility];  Service: Neurosurgery;  Laterality: N/A;  . PARTIAL HYSTERECTOMY  1960   fibroid tumors, ovaries not removed  . tonsillectomy    . TONSILLECTOMY     Family History  Problem Relation Age of Onset  . Stroke Mother   . Stroke Father   . Hypertension Father   . Diabetes Brother   . Multiple sclerosis Brother   . Breast cancer Daughter 451 . Colon cancer Neg Hx    Social History   Socioeconomic History  . Marital status: Married    Spouse name: Not on file  . Number of children: Not on file  . Years of education: Not on file  . Highest education level: Not on file  Occupational History  .  Not on file  Social Needs  . Financial resource strain: Not hard at all  . Food insecurity:    Worry: Never true    Inability: Never true  . Transportation needs:    Medical: No    Non-medical: No  Tobacco Use  . Smoking status: Never Smoker  . Smokeless tobacco: Never Used  Substance and Sexual Activity  . Alcohol use: No    Alcohol/week: 0.0 standard drinks  . Drug use: No  . Sexual activity: Never  Lifestyle  . Physical activity:    Days per week: 0 days    Minutes per session: Not on file  . Stress: Not at all  Relationships  . Social connections:    Talks on phone: Not on file    Gets together: Not on file    Attends religious service: Not on file    Active member of club or organization: Not on file    Attends meetings of clubs or  organizations: Not on file    Relationship status: Not on file  Other Topics Concern  . Not on file  Social History Narrative  . Not on file    Outpatient Encounter Medications as of 01/19/2018  Medication Sig  . albuterol (PROVENTIL HFA;VENTOLIN HFA) 108 (90 BASE) MCG/ACT inhaler Inhale 2 puffs into the lungs every 6 (six) hours as needed for wheezing or shortness of breath.  Marland Kitchen amLODipine (NORVASC) 5 MG tablet TAKE 1 TABLET (5 MG TOTAL) BY MOUTH DAILY.  Marland Kitchen aspirin 81 MG tablet Take 81 mg by mouth daily.  . blood glucose meter kit and supplies KIT Dispense based on patient and insurance preference. Use up to four times daily as directed. (FOR ICD-10:E11.65)  . Calcium Citrate-Vitamin D (CALCIUM + D PO) Take 1 tablet by mouth daily.  . Continuous Blood Gluc Receiver (FREESTYLE LIBRE 14 DAY READER) DEVI Use 1 each as needed .  Use to test blood sugar  . Continuous Blood Gluc Sensor (FREESTYLE LIBRE 14 DAY SENSOR) MISC Use 1 each every 14 (fourteen) days  . fexofenadine (ALLEGRA) 180 MG tablet Take 180 mg by mouth daily.  . fluticasone (FLONASE) 50 MCG/ACT nasal spray SPRAY 2 SPRAYS INTO EACH NOSTRIL EVERY DAY  . glucagon (GLUCAGON EMERGENCY) 1 MG injection Inject 1 mg into the muscle once as needed.  . Insulin Aspart Prot & Aspart (NOVOLOG MIX 70/30 [place]) Inject 11 Units into the skin 3 (three) times daily with meals.  . insulin lispro (HUMALOG KWIKPEN) 100 UNIT/ML KiwkPen Inject on a sliding scale between 14-28 units with each meal. Max daily dose 90 (Patient taking differently: Inject 6-11 Units into the skin 3 (three) times daily. Per sliding scale)  . Lancets (ACCU-CHEK SOFT TOUCH) lancets 1 each by Other route See admin instructions. Check blood sugar 4 times daily  . LANTUS SOLOSTAR 100 UNIT/ML Solostar Pen INJECT 24UNITS AT BEDTIME  . levothyroxine (SYNTHROID, LEVOTHROID) 150 MCG tablet TAKE 1 TABLET (150 MCG TOTAL) BY MOUTH DAILY.  Marland Kitchen LORazepam (ATIVAN) 0.5 MG tablet Take 0.5-1 tablets  (0.25-0.5 mg total) by mouth daily as needed for anxiety.  . metFORMIN (GLUCOPHAGE-XR) 500 MG 24 hr tablet TAKE 1 TABLET (500 MG TOTAL) BY MOUTH 2 (TWO) TIMES DAILY.  . Multiple Vitamins-Minerals (MULTIVITAMIN PO) Take 1 tablet by mouth daily.   . ONE TOUCH ULTRA TEST test strip USE TO TEST BLOOD SUGAR AS DIRECTED FOUR TIMES DAILY  . pantoprazole (PROTONIX) 40 MG tablet TAKE 1 TABLET BY MOUTH TWICE A DAY  .  traMADol (ULTRAM) 50 MG tablet Take 1 tablet (50 mg total) by mouth 2 (two) times daily as needed for moderate pain.  . valsartan-hydrochlorothiazide (DIOVAN-HCT) 160-12.5 MG tablet TAKE 1 TABLET BY MOUTH DAILY.  . [DISCONTINUED] Continuous Blood Gluc Receiver (FREESTYLE LIBRE 14 DAY READER) DEVI   . [DISCONTINUED] Continuous Blood Gluc Sensor (FREESTYLE LIBRE 14 DAY SENSOR) MISC    No facility-administered encounter medications on file as of 01/19/2018.     Activities of Daily Living In your present state of health, do you have any difficulty performing the following activities: 01/19/2018  Hearing? Y  Vision? N  Difficulty concentrating or making decisions? N  Walking or climbing stairs? Y  Comment Chronic knee and back pain  Dressing or bathing? N  Doing errands, shopping? N  Preparing Food and eating ? N  Using the Toilet? N  In the past six months, have you accidently leaked urine? Y  Comment Managed with pad  Do you have problems with loss of bowel control? N  Managing your Medications? N  Managing your Finances? N  Housekeeping or managing your Housekeeping? Y  Some recent data might be hidden    Patient Care Team: Einar Pheasant, MD as PCP - General (Internal Medicine)    Assessment:   This is a routine wellness examination for Eilyn.  Health Screenings  Mammogram 02/21/17 Bone Density -01/26/15 Glaucoma -none  Hearing  -hearing aids Hemoglobin A1C -10/09/17 (7.7) Cholesterol 09/18/17 (150) Dental- every 6 months Vision- every 12 onths  Social  Alcohol intake  -no Smoking history- no Smokers in home? none Illicit drug use? none Exercise -none Diet -low carb Sexually Active never  Safety  Patient feels safe at home.  Patient does have smoke detectors at home  Patient does wear sunscreen or protective clothing when in direct sunlight  Patient does wear seat belt when driving or riding with others.   Activities of Daily Living Daughter assists with household chores. Denies needing assistance with: driving, feeding themselves, getting from bed to chair, getting to the toilet, bathing/showering, dressing, managing money, climbing flight of stairs, or preparing meals.   Depression Screen Patient denies losing interest in daily life, feeling hopeless, or crying easily over simple problems.   Fall Screen Patient denies being afraid of falling or falling in the last year.   Memory Screen Patient denies problems with memory, misplacing items, and is able to balance checkbook/bank accounts.  Patient is alert, normal appearance, oriented to person/place/and time. Correctly identified the president of the [place], recall of 3/3 objects, and performing simple calculations.  Patient displays appropriate judgement and can read correct time from watch face.   Immunizations The following Immunizations are up to date: Influenza, shingles, pneumonia. TDAP discussed.   Other Providers Patient Care Team: Einar Pheasant, MD as PCP - General (Internal Medicine)  Exercise Activities and Dietary recommendations Current Exercise Habits: The patient does not participate in regular exercise at present  Goals      Patient Stated   . DIET - INCREASE WATER INTAKE (pt-stated)       Fall Risk Fall Risk  01/19/2018 01/18/2017 06/07/2016 12/09/2015 10/02/2015  Falls in the past year? 0 No No No No  Number falls in past yr: - - - - -  Injury with Fall? - - - - -  Comment - - - - -  Risk Factor Category  - - - - -  Follow up - - - - -   Depression Screen PHQ 2/9  Scores 01/19/2018 01/18/2017 06/07/2016 12/09/2015  PHQ - 2 Score 0 0 0 0  PHQ- 9 Score - 0 - -     Cognitive Function MMSE - Mini Mental State Exam 01/18/2017 12/09/2015 12/09/2014  Orientation to time 5 5 5   Orientation to Place 5 5 5   Registration 3 3 3   Attention/ Calculation 4 5 5   Recall 3 3 3   Language- name 2 objects 2 2 2   Language- repeat 1 1 1   Language- follow 3 step command 3 3 3   Language- read & follow direction 1 1 1   Write a sentence 1 1 1   Copy design 1 1 1   Total score 29 30 30      6CIT Screen 01/19/2018  What Year? 0 points  What month? 0 points  What time? 0 points  Count back from 20 0 points  Months in reverse 0 points  Repeat phrase 0 points  Total Score 0    Immunization History  Administered Date(s) Administered  . Influenza Split 12/12/2011, 10/01/2012  . Influenza, High Dose Seasonal PF 10/02/2015, 09/08/2016, 09/19/2017  . Influenza,inj,Quad PF,6+ Mos 09/03/2013, 10/27/2014  . Pneumococcal Conjugate-13 11/11/2014  . Pneumococcal Polysaccharide-23 12/09/2015   Screening Tests Health Maintenance  Topic Date Due  . TETANUS/TDAP  03/09/1950  . HEMOGLOBIN A1C  10/11/2017  . MAMMOGRAM  02/21/2018  . FOOT EXAM  05/06/2018  . OPHTHALMOLOGY EXAM  09/22/2018  . INFLUENZA VACCINE  Completed  . DEXA SCAN  Completed  . PNA vac Low Risk Adult  Completed       Plan:    End of life planning; Advanced aging; Advanced directives discussed.  No HCPOA/Living Will.  Additional information declined at this time.  I have personally reviewed and noted the following in the patient's chart:   . Medical and social history . Use of alcohol, tobacco or illicit drugs  . Current medications and supplements . Functional ability and status . Nutritional status . Physical activity . Advanced directives . List of other physicians . Hospitalizations, surgeries, and ER visits in previous 12 months . Vitals . Screenings to include cognitive, depression, and  falls . Referrals and appointments  In addition, I have reviewed and discussed with patient certain preventive protocols, quality metrics, and best practice recommendations. A written personalized care plan for preventive services as well as general preventive health recommendations were provided to patient.     Varney Biles, LPN  02/14/9415   Reviewed above information.  Agree with assessment and plan.   Dr Nicki Reaper

## 2018-01-23 DIAGNOSIS — J301 Allergic rhinitis due to pollen: Secondary | ICD-10-CM | POA: Diagnosis not present

## 2018-01-30 ENCOUNTER — Other Ambulatory Visit (INDEPENDENT_AMBULATORY_CARE_PROVIDER_SITE_OTHER): Payer: PPO

## 2018-01-30 DIAGNOSIS — E038 Other specified hypothyroidism: Secondary | ICD-10-CM | POA: Diagnosis not present

## 2018-01-30 DIAGNOSIS — E1165 Type 2 diabetes mellitus with hyperglycemia: Secondary | ICD-10-CM

## 2018-01-30 DIAGNOSIS — E7800 Pure hypercholesterolemia, unspecified: Secondary | ICD-10-CM

## 2018-01-30 DIAGNOSIS — J301 Allergic rhinitis due to pollen: Secondary | ICD-10-CM | POA: Diagnosis not present

## 2018-01-30 LAB — BASIC METABOLIC PANEL
BUN: 24 mg/dL — ABNORMAL HIGH (ref 6–23)
CO2: 28 mEq/L (ref 19–32)
Calcium: 9.5 mg/dL (ref 8.4–10.5)
Chloride: 103 mEq/L (ref 96–112)
Creatinine, Ser: 0.84 mg/dL (ref 0.40–1.20)
GFR: 64.46 mL/min (ref >60.00)
Glucose, Bld: 152 mg/dL — ABNORMAL HIGH (ref 70–99)
Potassium: 4.1 mEq/L (ref 3.5–5.1)
Sodium: 139 mEq/L (ref 135–145)

## 2018-01-30 LAB — HEPATIC FUNCTION PANEL
ALT: 16 U/L (ref 0–35)
AST: 17 U/L (ref 0–37)
Albumin: 3.9 g/dL (ref 3.5–5.2)
Alkaline Phosphatase: 71 U/L (ref 39–117)
Bilirubin, Direct: 0.1 mg/dL (ref 0.0–0.3)
Total Bilirubin: 0.5 mg/dL (ref 0.2–1.2)
Total Protein: 6.7 g/dL (ref 6.0–8.3)

## 2018-01-30 LAB — HEMOGLOBIN A1C: Hgb A1c MFr Bld: 8.1 % — ABNORMAL HIGH (ref 4.6–6.5)

## 2018-01-30 LAB — LIPID PANEL
Cholesterol: 149 mg/dL (ref 0–200)
HDL: 40.4 mg/dL (ref >39.00)
LDL Cholesterol: 86 mg/dL (ref 0–99)
NonHDL: 108.61
Total CHOL/HDL Ratio: 4
Triglycerides: 113 mg/dL (ref 0.0–149.0)
VLDL: 22.6 mg/dL (ref 0.0–40.0)

## 2018-02-01 ENCOUNTER — Ambulatory Visit (INDEPENDENT_AMBULATORY_CARE_PROVIDER_SITE_OTHER): Payer: PPO | Admitting: Internal Medicine

## 2018-02-01 ENCOUNTER — Encounter: Payer: Self-pay | Admitting: Internal Medicine

## 2018-02-01 VITALS — BP 136/62 | HR 71 | Temp 97.9°F | Wt 191.0 lb

## 2018-02-01 DIAGNOSIS — N182 Chronic kidney disease, stage 2 (mild): Secondary | ICD-10-CM

## 2018-02-01 DIAGNOSIS — E7800 Pure hypercholesterolemia, unspecified: Secondary | ICD-10-CM | POA: Diagnosis not present

## 2018-02-01 DIAGNOSIS — E1122 Type 2 diabetes mellitus with diabetic chronic kidney disease: Secondary | ICD-10-CM

## 2018-02-01 DIAGNOSIS — E038 Other specified hypothyroidism: Secondary | ICD-10-CM | POA: Diagnosis not present

## 2018-02-01 DIAGNOSIS — R0989 Other specified symptoms and signs involving the circulatory and respiratory systems: Secondary | ICD-10-CM

## 2018-02-01 DIAGNOSIS — Z8579 Personal history of other malignant neoplasms of lymphoid, hematopoietic and related tissues: Secondary | ICD-10-CM

## 2018-02-01 DIAGNOSIS — M4800 Spinal stenosis, site unspecified: Secondary | ICD-10-CM | POA: Diagnosis not present

## 2018-02-01 DIAGNOSIS — K219 Gastro-esophageal reflux disease without esophagitis: Secondary | ICD-10-CM | POA: Diagnosis not present

## 2018-02-01 DIAGNOSIS — I10 Essential (primary) hypertension: Secondary | ICD-10-CM | POA: Diagnosis not present

## 2018-02-01 DIAGNOSIS — E669 Obesity, unspecified: Secondary | ICD-10-CM | POA: Diagnosis not present

## 2018-02-01 DIAGNOSIS — E1165 Type 2 diabetes mellitus with hyperglycemia: Secondary | ICD-10-CM | POA: Diagnosis not present

## 2018-02-01 DIAGNOSIS — K3184 Gastroparesis: Secondary | ICD-10-CM

## 2018-02-01 DIAGNOSIS — Z1239 Encounter for other screening for malignant neoplasm of breast: Secondary | ICD-10-CM

## 2018-02-01 DIAGNOSIS — M25562 Pain in left knee: Secondary | ICD-10-CM

## 2018-02-01 DIAGNOSIS — Z1231 Encounter for screening mammogram for malignant neoplasm of breast: Secondary | ICD-10-CM

## 2018-02-01 DIAGNOSIS — Z0000 Encounter for general adult medical examination without abnormal findings: Secondary | ICD-10-CM

## 2018-02-01 NOTE — Progress Notes (Signed)
Patient ID: Marissa Huff, female   DOB: [birth year], 85 y.o.   MRN: [id number]   Subjective:    Patient ID: Marissa Huff, female    DOB: 19-Aug-1931, 85 y.o.   MRN: [id number]  HPI  Patient here for her physical exam.  She reports she is doing relatively well.  Main complaint is that of back pain.  Persistent and increased.  Has seen Dr Ellene Route and Dr Sharlet Salina previously (for her back).  Also has seen Barnet Pall for her knee pain.  S/p injection in hip.  Request referral back to ortho and Dr Sharlet Salina.  Breathing stable.  No chest pain.  No acid reflux.  No abdominal pain.  Bowels moving.  Discussed labs.  a1c elevated.  She has f/u with endocrinology next week.  Discussed diet and exercise.  She reports sugars now - AM sugars 130-140.  PM sugars - 200.  Handling stress.  Receiving allergy injections.     Past Medical History:  Diagnosis Date  . Allergy   . Arthritis   . Carpal tunnel syndrome, bilateral   . Diabetes mellitus without complication ([place])   . Diverticulosis   . Gastric lymphoma ([place])   . Gastroparesis   . GERD (gastroesophageal reflux disease)   . Hypertension   . Hypothyroidism   . PUD (peptic ulcer disease)   . Skin cancer   . Spinal stenosis    lumbar laminectomy   Past Surgical History:  Procedure Laterality Date  . ABDOMINAL HYSTERECTOMY    . antrectomy and vagotomy  1994  . arthroscopic left knee  12/05/08  . BACK SURGERY    . carpal tunnel repai  10/10   left   . CATARACT EXTRACTION, BILATERAL    . EYE SURGERY     bilateral cataracts  . 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 LAPAROSCOPIC CHOLECYSTECTOMY  6/01  . LUMBAR LAMINECTOMY  1999  . LUMBAR LAMINECTOMY WITH COFLEX 1 LEVEL N/A 09/15/2015   Procedure: Lumbar one-two Laminectomy with placement of coflex;  Surgeon: Kristeen Miss, MD;  Location: [facility];  Service: Neurosurgery;  Laterality: N/A;  . PARTIAL HYSTERECTOMY  1960   fibroid tumors, ovaries not removed  . tonsillectomy    . TONSILLECTOMY      Family History  Problem Relation Age of Onset  . Stroke Mother   . Stroke Father   . Hypertension Father   . Diabetes Brother   . Multiple sclerosis Brother   . Breast cancer Daughter 34  . Colon cancer Neg Hx    Social History   Socioeconomic History  . Marital status: Married    Spouse name: Not on file  . Number of children: Not on file  . Years of education: Not on file  . Highest education level: Not on file  Occupational History  . Not on file  Social Needs  . Financial resource strain: Not hard at all  . Food insecurity:    Worry: Never true    Inability: Never true  . Transportation needs:    Medical: No    Non-medical: No  Tobacco Use  . Smoking status: Never Smoker  . Smokeless tobacco: Never Used  Substance and Sexual Activity  . Alcohol use: No    Alcohol/week: 0.0 standard drinks  . Drug use: No  . Sexual activity: Never  Lifestyle  . Physical activity:    Days per week: 0 days    Minutes per session: Not on file  .  Stress: Not at all  Relationships  . Social connections:    Talks on phone: Not on file    Gets together: Not on file    Attends religious service: Not on file    Active member of club or organization: Not on file    Attends meetings of clubs or organizations: Not on file    Relationship status: Not on file  Other Topics Concern  . Not on file  Social History Narrative  . Not on file    Outpatient Encounter Medications as of 02/01/2018  Medication Sig  . albuterol (PROVENTIL HFA;VENTOLIN HFA) 108 (90 BASE) MCG/ACT inhaler Inhale 2 puffs into the lungs every 6 (six) hours as needed for wheezing or shortness of breath.  Marland Kitchen amLODipine (NORVASC) 5 MG tablet TAKE 1 TABLET (5 MG TOTAL) BY MOUTH DAILY.  Marland Kitchen aspirin 81 MG tablet Take 81 mg by mouth daily.  . blood glucose meter kit and supplies KIT Dispense based on patient and insurance preference. Use up to four times daily as directed. (FOR ICD-10:E11.65)  . Calcium Citrate-Vitamin D  (CALCIUM + D PO) Take 1 tablet by mouth daily.  . Continuous Blood Gluc Receiver (FREESTYLE LIBRE 14 DAY READER) DEVI Use 1 each as needed .  Use to test blood sugar  . Continuous Blood Gluc Sensor (FREESTYLE LIBRE 14 DAY SENSOR) MISC Use 1 each every 14 (fourteen) days  . fexofenadine (ALLEGRA) 180 MG tablet Take 180 mg by mouth daily.  . fluticasone (FLONASE) 50 MCG/ACT nasal spray SPRAY 2 SPRAYS INTO EACH NOSTRIL EVERY DAY  . glucagon (GLUCAGON EMERGENCY) 1 MG injection Inject 1 mg into the muscle once as needed.  . Insulin Aspart Prot & Aspart (NOVOLOG MIX 70/30 [place]) Inject 11 Units into the skin 3 (three) times daily with meals.  . insulin lispro (HUMALOG KWIKPEN) 100 UNIT/ML KiwkPen Inject on a sliding scale between 14-28 units with each meal. Max daily dose 90 (Patient taking differently: Inject 6-11 Units into the skin 3 (three) times daily. Per sliding scale)  . Lancets (ACCU-CHEK SOFT TOUCH) lancets 1 each by Other route See admin instructions. Check blood sugar 4 times daily  . LANTUS SOLOSTAR 100 UNIT/ML Solostar Pen INJECT 24UNITS AT BEDTIME  . levothyroxine (SYNTHROID, LEVOTHROID) 150 MCG tablet TAKE 1 TABLET (150 MCG TOTAL) BY MOUTH DAILY.  Marland Kitchen LORazepam (ATIVAN) 0.5 MG tablet Take 0.5-1 tablets (0.25-0.5 mg total) by mouth daily as needed for anxiety.  . metFORMIN (GLUCOPHAGE-XR) 500 MG 24 hr tablet TAKE 1 TABLET (500 MG TOTAL) BY MOUTH 2 (TWO) TIMES DAILY.  . Multiple Vitamins-Minerals (MULTIVITAMIN PO) Take 1 tablet by mouth daily.   . ONE TOUCH ULTRA TEST test strip USE TO TEST BLOOD SUGAR AS DIRECTED FOUR TIMES DAILY  . pantoprazole (PROTONIX) 40 MG tablet TAKE 1 TABLET BY MOUTH TWICE A DAY  . traMADol (ULTRAM) 50 MG tablet Take 1 tablet (50 mg total) by mouth 2 (two) times daily as needed for moderate pain.  . valsartan-hydrochlorothiazide (DIOVAN-HCT) 160-12.5 MG tablet TAKE 1 TABLET BY MOUTH DAILY.   No facility-administered encounter medications on file as of 02/01/2018.      Review of Systems  Constitutional: Negative for appetite change and unexpected weight change.  HENT: Negative for congestion and sinus pressure.   Eyes: Negative for pain and visual disturbance.  Respiratory: Negative for cough, chest tightness and shortness of breath.   Cardiovascular: Negative for chest pain, palpitations and leg swelling.  Gastrointestinal: Negative for abdominal pain, diarrhea, nausea and  vomiting.  Genitourinary: Negative for difficulty urinating and dysuria.  Musculoskeletal: Positive for back pain. Negative for joint swelling and myalgias.  Skin: Negative for color change and rash.  Neurological: Negative for dizziness, light-headedness and headaches.  Hematological: Negative for adenopathy. Does not bruise/bleed easily.  Psychiatric/Behavioral: Negative for agitation and dysphoric mood.       Objective:    Physical Exam Constitutional:      General: She is not in acute distress.    Appearance: Normal appearance. She is well-developed.  HENT:     Nose: Nose normal. No congestion.     Mouth/Throat:     Pharynx: No oropharyngeal exudate or posterior oropharyngeal erythema.  Eyes:     General: No scleral icterus.       Right eye: No discharge.        Left eye: No discharge.  Neck:     Musculoskeletal: Neck supple. No muscular tenderness.     Thyroid: No thyromegaly.  Cardiovascular:     Rate and Rhythm: Normal rate and regular rhythm.  Pulmonary:     Effort: No tachypnea, accessory muscle usage or respiratory distress.     Breath sounds: Normal breath sounds. No decreased breath sounds or wheezing.  Chest:     Breasts:        Right: No inverted nipple, mass, nipple discharge or tenderness (no axillary adenopathy).        Left: No inverted nipple, mass, nipple discharge or tenderness (no axilarry adenopathy).  Abdominal:     General: Bowel sounds are normal.     Palpations: Abdomen is soft.     Tenderness: There is no abdominal tenderness.   Musculoskeletal:        General: No swelling or tenderness.  Lymphadenopathy:     Cervical: No cervical adenopathy.  Skin:    Findings: No erythema or rash.  Neurological:     Mental Status: She is alert and oriented to person, place, and time.  Psychiatric:        Mood and Affect: Mood normal.        Behavior: Behavior normal.     BP 136/62 (BP Location: Left Arm, Patient Position: Sitting, Cuff Size: Large)   Pulse 71   Temp 97.9 F (36.6 C)   Wt 191 lb (86.6 kg)   SpO2 98%   BMI 32.79 kg/m  Wt Readings from Last 3 Encounters:  02/01/18 191 lb (86.6 kg)  01/19/18 189 lb 6.4 oz (85.9 kg)  09/19/17 192 lb (87.1 kg)     Lab Results  Component Value Date   WBC 4.6 05/02/2017   HGB 12.0 05/02/2017   HCT 34.9 (L) 05/02/2017   PLT 221.0 05/02/2017   GLUCOSE 152 (H) 01/30/2018   CHOL 149 01/30/2018   TRIG 113.0 01/30/2018   HDL 40.40 01/30/2018   LDLCALC 86 01/30/2018   ALT 16 01/30/2018   AST 17 01/30/2018   NA 139 01/30/2018   K 4.1 01/30/2018   CL 103 01/30/2018   CREATININE 0.84 01/30/2018   BUN 24 (H) 01/30/2018   CO2 28 01/30/2018   TSH 4.16 05/02/2017   HGBA1C 8.1 (H) 01/30/2018   MICROALBUR 3.5 (H) 02/01/2018    Mm Screening Breast Tomo Bilateral  Result Date: 02/21/2017 CLINICAL DATA:  Screening. EXAM: DIGITAL SCREENING BILATERAL MAMMOGRAM WITH TOMO AND CAD COMPARISON:  Previous exam(s). ACR Breast Density Category b: There are scattered areas of fibroglandular density. FINDINGS: There are no findings suspicious for malignancy. Images were processed with CAD.   IMPRESSION: No mammographic evidence of malignancy. A result letter of this screening mammogram will be mailed directly to the patient. RECOMMENDATION: Screening mammogram in one year. (Code:SM-B-01Y) BI-RADS CATEGORY  1: Negative. Electronically Signed   By: Jennifer  Jarosz M.D.   On: 02/21/2017 12:04       Assessment & Plan:   Problem List Items Addressed This Visit    CKD stage 2 due to type  2 diabetes mellitus (HCC)    Stay hydrated. Avoid antiinflammatories.        Gastroparesis    Has been followed by GI.       GERD (gastroesophageal reflux disease)    On protonix.        Health care maintenance    Physical today 02/01/18.  Mammogram 02/21/17 - birads I.        History of lymphoma    History of gastric lymphoma.  Seeing GI.       Hypercholesterolemia    Low cholesterol diet and exercise.  Follow lipid panel.        Relevant Orders   Hepatic function panel   Lipid panel   Hypertension    Blood pressure under good control.  Continue same medication regimen.  Follow pressures.  Follow metabolic panel.        Relevant Orders   CBC with Differential/Platelet   TSH   Basic metabolic panel   Hypothyroidism    On thyroid replacement.  Follow tsh.       Obesity (BMI 30-39.9)    Discussed diet and exercise.  Follow.       Right carotid bruit    Right carotid bruit.  Schedule carotid ultrasound.        Relevant Orders   VAS US CAROTID   Spinal stenosis    S/p surgery.  Persistent increased back pain.  Has seen Dr Chasnis.  Request referral back to Dr Chasnis.        Relevant Orders   Ambulatory referral to Orthopedic Surgery   Type 2 diabetes mellitus, uncontrolled (HCC)    Low carb diet and exercise.  Sees Dr O'Connell.  Due to f/u next week.  Discussed low carb diet and exercise.  Hold on making changes in medication since f/u scheduled next week.  States am sugars now - 130-140 and pm sugars 200.        Relevant Orders   Hemoglobin A1c    Other Visit Diagnoses    Visit for screening mammogram    -  Primary   Relevant Orders   MM 3D SCREEN BREAST BILATERAL   Breast cancer screening       Relevant Orders   MM 3D SCREEN BREAST BILATERAL   Left knee pain, unspecified chronicity       Relevant Orders   Ambulatory referral to Orthopedic Surgery       Charlene Scott, MD  

## 2018-02-01 NOTE — Assessment & Plan Note (Signed)
Physical today 02/01/18.  Mammogram 02/21/17 - birads I.

## 2018-02-02 ENCOUNTER — Encounter: Payer: Self-pay | Admitting: Internal Medicine

## 2018-02-02 LAB — MICROALBUMIN / CREATININE URINE RATIO
Creatinine,U: 109.1 mg/dL
Microalb Creat Ratio: 3.2 mg/g (ref 0.0–30.0)
Microalb, Ur: 3.5 mg/dL — ABNORMAL HIGH (ref 0.0–1.9)

## 2018-02-04 ENCOUNTER — Encounter: Payer: Self-pay | Admitting: Internal Medicine

## 2018-02-04 DIAGNOSIS — R0989 Other specified symptoms and signs involving the circulatory and respiratory systems: Secondary | ICD-10-CM | POA: Insufficient documentation

## 2018-02-04 NOTE — Assessment & Plan Note (Signed)
S/p surgery.  Persistent increased back pain.  Has seen Dr Sharlet Salina.  Request referral back to Dr Sharlet Salina.

## 2018-02-04 NOTE — Assessment & Plan Note (Signed)
Low cholesterol diet and exercise.  Follow lipid panel.   

## 2018-02-04 NOTE — Assessment & Plan Note (Signed)
Discussed diet and exercise.  Follow.  

## 2018-02-04 NOTE — Assessment & Plan Note (Signed)
History of gastric lymphoma.  Seeing GI.

## 2018-02-04 NOTE — Assessment & Plan Note (Signed)
Low carb diet and exercise.  Sees Dr Honor Junes.  Due to f/u next week.  Discussed low carb diet and exercise.  Hold on making changes in medication since f/u scheduled next week.  States am sugars now - 130-140 and pm sugars 200.

## 2018-02-04 NOTE — Assessment & Plan Note (Signed)
Stay hydrated.  Avoid antiinflammatories.  

## 2018-02-04 NOTE — Assessment & Plan Note (Signed)
Blood pressure under good control.  Continue same medication regimen.  Follow pressures.  Follow metabolic panel.   

## 2018-02-04 NOTE — Assessment & Plan Note (Signed)
Right carotid bruit.  Schedule carotid ultrasound.

## 2018-02-04 NOTE — Assessment & Plan Note (Signed)
Has been followed by GI.  

## 2018-02-04 NOTE — Assessment & Plan Note (Signed)
On protonix

## 2018-02-04 NOTE — Assessment & Plan Note (Signed)
On thyroid replacement.  Follow tsh.  

## 2018-02-05 ENCOUNTER — Telehealth: Payer: Self-pay | Admitting: *Deleted

## 2018-02-05 DIAGNOSIS — R809 Proteinuria, unspecified: Secondary | ICD-10-CM | POA: Diagnosis not present

## 2018-02-05 DIAGNOSIS — J301 Allergic rhinitis due to pollen: Secondary | ICD-10-CM | POA: Diagnosis not present

## 2018-02-05 DIAGNOSIS — E1029 Type 1 diabetes mellitus with other diabetic kidney complication: Secondary | ICD-10-CM | POA: Diagnosis not present

## 2018-02-05 DIAGNOSIS — E1159 Type 2 diabetes mellitus with other circulatory complications: Secondary | ICD-10-CM | POA: Diagnosis not present

## 2018-02-05 DIAGNOSIS — E039 Hypothyroidism, unspecified: Secondary | ICD-10-CM | POA: Diagnosis not present

## 2018-02-05 DIAGNOSIS — I10 Essential (primary) hypertension: Secondary | ICD-10-CM | POA: Diagnosis not present

## 2018-02-05 NOTE — Telephone Encounter (Signed)
Copied from [place] [id number]. Topic: Referral - Status >> Feb 05, 2018  4:52 PM Selinda Flavin B, [place] wrote: Reason for CRM: Patient calling and states that she had seen Dr Nicki Reaper on Thursday 02/01/2018 and she was supposed to schedule 3 appointments for her. States that she has not heard anything yet. Informed her that 2 referrals were placed, one for ortho for left knee and the other for back pain. Patient states that there was supposed to be one more for her to have her carotid artery checked. Please advise.

## 2018-02-06 NOTE — Telephone Encounter (Signed)
It is ordered.  It is under CV vascular (this is a test I ordered).  It is not under the referral section.  Please notify pt  that it is ordered and may take a few days to schedule.  please forward to [facility].  Thanks

## 2018-02-06 NOTE — Telephone Encounter (Signed)
Left detailed message for pt letting her know that all orders have been placed and may take a few days. Forwarding to [employer].

## 2018-02-06 NOTE — Telephone Encounter (Signed)
I dont see anything about her carotid since 2017. Is this something that was discussed at her visit?

## 2018-02-13 ENCOUNTER — Ambulatory Visit (INDEPENDENT_AMBULATORY_CARE_PROVIDER_SITE_OTHER): Payer: PPO

## 2018-02-13 DIAGNOSIS — R0989 Other specified symptoms and signs involving the circulatory and respiratory systems: Secondary | ICD-10-CM

## 2018-02-13 DIAGNOSIS — J301 Allergic rhinitis due to pollen: Secondary | ICD-10-CM | POA: Diagnosis not present

## 2018-02-14 ENCOUNTER — Other Ambulatory Visit: Payer: Self-pay | Admitting: Internal Medicine

## 2018-02-14 DIAGNOSIS — M48061 Spinal stenosis, lumbar region without neurogenic claudication: Secondary | ICD-10-CM | POA: Diagnosis not present

## 2018-02-14 DIAGNOSIS — I6523 Occlusion and stenosis of bilateral carotid arteries: Secondary | ICD-10-CM

## 2018-02-14 DIAGNOSIS — M5416 Radiculopathy, lumbar region: Secondary | ICD-10-CM | POA: Diagnosis not present

## 2018-02-14 DIAGNOSIS — M5136 Other intervertebral disc degeneration, lumbar region: Secondary | ICD-10-CM | POA: Diagnosis not present

## 2018-02-14 NOTE — Progress Notes (Signed)
Order placed for vascular surgery referral.   

## 2018-02-16 ENCOUNTER — Telehealth: Payer: Self-pay

## 2018-02-16 NOTE — Telephone Encounter (Signed)
Copied from [place] [id number]. Topic: General - Call Back - No Documentation >> Feb 16, 2018 11:45 AM Sheran Luz wrote: Reason for CRM: Patient returning call to office- no documentation

## 2018-02-19 NOTE — Telephone Encounter (Signed)
I did not try calling pt. She is already scheduled with Vascular. Larena Glassman have you tried calling her?

## 2018-02-19 NOTE — Telephone Encounter (Signed)
Left message letting pt know that I did not call her

## 2018-02-20 DIAGNOSIS — J301 Allergic rhinitis due to pollen: Secondary | ICD-10-CM | POA: Diagnosis not present

## 2018-02-20 DIAGNOSIS — M1612 Unilateral primary osteoarthritis, left hip: Secondary | ICD-10-CM | POA: Diagnosis not present

## 2018-02-20 DIAGNOSIS — M25562 Pain in left knee: Secondary | ICD-10-CM | POA: Diagnosis not present

## 2018-02-23 ENCOUNTER — Encounter (INDEPENDENT_AMBULATORY_CARE_PROVIDER_SITE_OTHER): Payer: Self-pay | Admitting: Vascular Surgery

## 2018-02-23 ENCOUNTER — Ambulatory Visit (INDEPENDENT_AMBULATORY_CARE_PROVIDER_SITE_OTHER): Payer: PPO | Admitting: Vascular Surgery

## 2018-02-23 VITALS — BP 146/70 | HR 65 | Resp 16 | Ht 64.0 in | Wt 191.6 lb

## 2018-02-23 DIAGNOSIS — I10 Essential (primary) hypertension: Secondary | ICD-10-CM

## 2018-02-23 DIAGNOSIS — E7800 Pure hypercholesterolemia, unspecified: Secondary | ICD-10-CM | POA: Diagnosis not present

## 2018-02-23 DIAGNOSIS — E1165 Type 2 diabetes mellitus with hyperglycemia: Secondary | ICD-10-CM | POA: Diagnosis not present

## 2018-02-23 DIAGNOSIS — I6523 Occlusion and stenosis of bilateral carotid arteries: Secondary | ICD-10-CM | POA: Diagnosis not present

## 2018-02-23 DIAGNOSIS — I779 Disorder of arteries and arterioles, unspecified: Secondary | ICD-10-CM | POA: Insufficient documentation

## 2018-02-23 DIAGNOSIS — I6529 Occlusion and stenosis of unspecified carotid artery: Secondary | ICD-10-CM | POA: Insufficient documentation

## 2018-02-23 NOTE — Assessment & Plan Note (Signed)
blood glucose control important in reducing the progression of atherosclerotic disease. Also, involved in wound healing. On appropriate medications.  

## 2018-02-23 NOTE — Progress Notes (Signed)
Patient ID: Marissa Huff, female   DOB: [birth year], 85 y.o.   MRN: [id number]  Chief Complaint  Patient presents with  . New Patient (Initial Visit)    ref Marissa Huff for carotid stenosis    HPI Marissa MCCROSKEY is a 85 y.o. female.  I am asked to see the patient by Dr. Nicki Huff for evaluation of carotid stenosis.  The patient reports a carotid bruit was detected by her PCP on a routine physical.  She denies any focal neurologic symptoms. Specifically, the patient denies amaurosis fugax, speech or swallowing difficulties, or arm or leg weakness or numbness.  Says she had had her carotids checked some years ago and at that time they were not too bad.  On her recent ultrasound, she had velocities consistent with 60 to 79% ICA stenosis bilaterally.  This was done earlier this month at our office as a lab only study.   Past Medical History:  Diagnosis Date  . Allergy   . Arthritis   . Carpal tunnel syndrome, bilateral   . Diabetes mellitus without complication ([place])   . Diverticulosis   . 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 Gastroparesis   . GERD (gastroesophageal reflux disease)   . Hypertension   . Hypothyroidism   . PUD (peptic ulcer disease)   . Skin cancer   . Spinal stenosis    lumbar laminectomy    Past Surgical History:  Procedure Laterality Date  . ABDOMINAL HYSTERECTOMY    . antrectomy and vagotomy  1994  . arthroscopic left knee  12/05/08  . BACK SURGERY    . carpal tunnel repai  10/10   left   . CATARACT EXTRACTION, BILATERAL    . EYE SURGERY     bilateral cataracts  . 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 LAPAROSCOPIC CHOLECYSTECTOMY  6/01  . LUMBAR LAMINECTOMY  1999  . LUMBAR LAMINECTOMY WITH COFLEX 1 LEVEL N/A 09/15/2015   Procedure: Lumbar one-two Laminectomy with placement of coflex;  Surgeon: Kristeen Miss, MD;  Location: [facility];  Service: Neurosurgery;  Laterality: N/A;  . PARTIAL HYSTERECTOMY  1960   fibroid tumors, ovaries not removed  . tonsillectomy    .  TONSILLECTOMY      Family History  Problem Relation Age of Onset  . Stroke Mother   . Stroke Father   . Hypertension Father   . Diabetes Brother   . Multiple sclerosis Brother   . Breast cancer Daughter 14  . Colon cancer Neg Hx     Social History Social History   Tobacco Use  . Smoking status: Never Smoker  . Smokeless tobacco: Never Used  Substance Use Topics  . Alcohol use: No    Alcohol/week: 0.0 standard drinks  . Drug use: No    Allergies  Allergen Reactions  . Penicillins Rash    Current Outpatient Medications  Medication Sig Dispense Refill  . albuterol (PROVENTIL HFA;VENTOLIN HFA) 108 (90 BASE) MCG/ACT inhaler Inhale 2 puffs into the lungs every 6 (six) hours as needed for wheezing or shortness of breath. 1 Inhaler 1  . amLODipine (NORVASC) 5 MG tablet TAKE 1 TABLET (5 MG TOTAL) BY MOUTH DAILY. 90 tablet 1  . aspirin 81 MG tablet Take 81 mg by mouth daily.    . blood glucose meter kit and supplies KIT Dispense based on patient and insurance preference. Use up to four times daily as directed. (FOR ICD-10:E11.65) 1 each 0  . Calcium Citrate-Vitamin D (CALCIUM +  D PO) Take 1 tablet by mouth daily.    . Continuous Blood Gluc Receiver (FREESTYLE LIBRE 14 DAY READER) DEVI Use 1 each as needed .  Use to test blood sugar    . Continuous Blood Gluc Sensor (FREESTYLE LIBRE 14 DAY SENSOR) MISC Use 1 each every 14 (fourteen) days    . fexofenadine (ALLEGRA) 180 MG tablet Take 180 mg by mouth daily.    . fluticasone (FLONASE) 50 MCG/ACT nasal spray SPRAY 2 SPRAYS INTO EACH NOSTRIL EVERY DAY 48 g 3  . glucagon (GLUCAGON EMERGENCY) 1 MG injection Inject 1 mg into the muscle once as needed. 1 each 3  . Insulin Aspart Prot & Aspart (NOVOLOG MIX 70/30 ) Inject 11 Units into the skin 3 (three) times daily with meals.    . insulin lispro (HUMALOG KWIKPEN) 100 UNIT/ML KiwkPen Inject on a sliding scale between 14-28 units with each meal. Max daily dose 90 (Patient taking differently:  Inject 6-11 Units into the skin 3 (three) times daily. Per sliding scale) 90 mL 3  . Lancets (ACCU-CHEK SOFT TOUCH) lancets 1 each by Other route See admin instructions. Check blood sugar 4 times daily 200 each 4  . LANTUS SOLOSTAR 100 UNIT/ML Solostar Pen INJECT 24UNITS AT BEDTIME  4  . levothyroxine (SYNTHROID, LEVOTHROID) 150 MCG tablet TAKE 1 TABLET (150 MCG TOTAL) BY MOUTH DAILY. 90 tablet 3  . LORazepam (ATIVAN) 0.5 MG tablet Take 0.5-1 tablets (0.25-0.5 mg total) by mouth daily as needed for anxiety. 30 tablet 0  . metFORMIN (GLUCOPHAGE-XR) 500 MG 24 hr tablet TAKE 1 TABLET (500 MG TOTAL) BY MOUTH 2 (TWO) TIMES DAILY. 180 tablet 1  . Multiple Vitamins-Minerals (MULTIVITAMIN PO) Take 1 tablet by mouth daily.     . ONE TOUCH ULTRA TEST test strip USE TO TEST BLOOD SUGAR AS DIRECTED FOUR TIMES DAILY 200 each 12  . pantoprazole (PROTONIX) 40 MG tablet TAKE 1 TABLET BY MOUTH TWICE A DAY 180 tablet 1  . traMADol (ULTRAM) 50 MG tablet Take 1 tablet (50 mg total) by mouth 2 (two) times daily as needed for moderate pain. 60 tablet 1  . valsartan-hydrochlorothiazide (DIOVAN-HCT) 160-12.5 MG tablet TAKE 1 TABLET BY MOUTH DAILY. 90 tablet 3   No current facility-administered medications for this visit.       REVIEW OF SYSTEMS (Negative unless checked)  Constitutional: [] Weight loss  [] Fever  [] Chills Cardiac: [] Chest pain   [] Chest pressure   [] Palpitations   [] Shortness of breath when laying flat   [] Shortness of breath at rest   [] Shortness of breath with exertion. Vascular:  [] Pain in legs with walking   [] Pain in legs at rest   [] Pain in legs when laying flat   [] Claudication   [] Pain in feet when walking  [] Pain in feet at rest  [] Pain in feet when laying flat   [] History of DVT   [] Phlebitis   [] Swelling in legs   [] Varicose veins   [] Non-healing ulcers Pulmonary:   [] Uses home oxygen   [] Productive cough   [] Hemoptysis   [] Wheeze  [] COPD   [] Asthma Neurologic:  [] Dizziness  [] Blackouts    [] Seizures   [] History of stroke   [] History of TIA  [] Aphasia   [] Temporary blindness   [] Dysphagia   [] Weakness or numbness in arms   [] Weakness or numbness in legs Musculoskeletal:  [x] Arthritis   [] Joint swelling   [x] Joint pain   [x] Low back pain Hematologic:  [] Easy bruising  [] Easy bleeding   [] Hypercoagulable state   []   Anemic  [] Hepatitis Gastrointestinal:  [] Blood in stool   [] Vomiting blood  [x] Gastroesophageal reflux/heartburn   [] Abdominal pain Genitourinary:  [] Chronic kidney disease   [] Difficult urination  [] Frequent urination  [] Burning with urination   [] Hematuria Skin:  [] Rashes   [] Ulcers   [] Wounds Psychological:  [] History of anxiety   []  History of major depression.    Physical Exam BP (!) 146/70 (BP Location: Right Arm)   Pulse 65   Resp 16   Ht 5' 4"  (1.626 m)   Wt 191 lb 9.6 oz (86.9 kg)   BMI 32.89 kg/m  Gen:  WD/WN, NAD. Appears younger than stated age. Head: [place]/AT, No temporalis wasting. Prominent temp pulse not noted. Ear/Nose/Throat: Hearing grossly intact, nares w/o erythema or drainage, oropharynx w/o Erythema/Exudate Eyes: Conjunctiva clear, sclera non-icteric  Neck: trachea midline.  No bruit or JVD.  Pulmonary:  Good air movement, respirations not labored, clear bilaterally Cardiac: RRR, no JVD Vascular:  Vessel Right Left  Radial Palpable Palpable                                   Gastrointestinal: soft, non-tender/non-distended.  Musculoskeletal: M/S 5/5 throughout.  Extremities without ischemic changes.  No deformity or atrophy. Diffuse varicose veins bilaterally.  Trace LE edema. Neurologic: Sensation grossly intact in extremities.  Symmetrical.  Speech is fluent. Motor exam as listed above. Psychiatric: Judgment intact, Mood & affect appropriate for pt's clinical situation. Dermatologic: No rashes or ulcers noted.  No cellulitis or open wounds.    Radiology Vas US Carotid  Result Date: 02/13/2018 Carotid Arterial Duplex Study  Indications:       Bruit. Comparison Study:  07/28/2016 Performing Technologist: Charlane Ferretti RT (R)(VS)  Examination Guidelines: A complete evaluation includes B-mode imaging, spectral Doppler, color Doppler, and power Doppler as needed of all accessible portions of each vessel. Bilateral testing is considered an integral part of a complete examination. Limited examinations for reoccurring indications may be performed as noted.  Right Carotid Findings: +----------+--------+--------+--------+--------+--------+           PSV cm/sEDV cm/sStenosisDescribeComments +----------+--------+--------+--------+--------+--------+ CCA Prox  84      10                      tortuous +----------+--------+--------+--------+--------+--------+ CCA Mid   51      7                                +----------+--------+--------+--------+--------+--------+ CCA Distal87      16                               +----------+--------+--------+--------+--------+--------+ ICA Prox  240     19                               +----------+--------+--------+--------+--------+--------+ ICA Mid   142     18                               +----------+--------+--------+--------+--------+--------+ ICA Distal53      11                               +----------+--------+--------+--------+--------+--------+  ECA       383     12              calcific         +----------+--------+--------+--------+--------+--------+ +----------+--------+-------+--------+-------------------+           PSV cm/sEDV cmsDescribeArm Pressure (mmHG) +----------+--------+-------+--------+-------------------+ Subclavian150                                        +----------+--------+-------+--------+-------------------+ +---------+--------+---+--------+ VertebralPSV cm/s158EDV cm/s +---------+--------+---+--------+ Right ICA/CCA ratio = 2.86  Left Carotid Findings:  +----------+--------+--------+--------+--------+--------+           PSV cm/sEDV cm/sStenosisDescribeComments +----------+--------+--------+--------+--------+--------+ CCA Prox  258     24                      tortuous +----------+--------+--------+--------+--------+--------+ CCA Mid   117     23                               +----------+--------+--------+--------+--------+--------+ CCA Distal165     24                               +----------+--------+--------+--------+--------+--------+ ICA Prox  215     20                               +----------+--------+--------+--------+--------+--------+ ICA Mid   96      27                               +----------+--------+--------+--------+--------+--------+ ICA Distal65      12                               +----------+--------+--------+--------+--------+--------+ ECA       174     7                                +----------+--------+--------+--------+--------+--------+ +----------+--------+--------+--------+-------------------+ SubclavianPSV cm/sEDV cm/sDescribeArm Pressure (mmHG) +----------+--------+--------+--------+-------------------+           189                                         +----------+--------+--------+--------+-------------------+ +---------+--------+--+--------+ VertebralPSV cm/s71EDV cm/s +---------+--------+--+--------+ Left ICA/CCA ratio = 1.30  Summary: Right Carotid: Velocities in the right ICA are consistent with a 60-79%                stenosis. The ECA appears >50% stenosed. Elevated velocity with                no stenosis visualized. Left Carotid: Velocities in the left ICA are consistent with a 60-79% stenosis.               Elevated velocity with no stenosis visualized. Vertebrals:  Bilateral vertebral arteries demonstrate antegrade flow. Subclavians: Normal flow hemodynamics were seen in bilateral subclavian              arteries. *See table(s) above for  measurements and observations.  Electronically  signed by Leotis Pain MD on 02/13/2018 at 2:22:10 PM.    Final     Labs Recent Results (from the past 2160 hour(s))  Basic metabolic panel     Status: Abnormal   Collection Time: 01/30/18  8:37 AM  Result Value Ref Range   Sodium 139 135 - 145 mEq/L   Potassium 4.1 3.5 - 5.1 mEq/L   Chloride 103 96 - 112 mEq/L   CO2 28 19 - 32 mEq/L   Glucose, Bld 152 (H) 70 - 99 mg/dL   BUN 24 (H) 6 - 23 mg/dL   Creatinine, Ser 0.84 0.40 - 1.20 mg/dL   Calcium 9.5 8.4 - 10.5 mg/dL   GFR 64.46 >60.00 mL/min  Lipid panel     Status: None   Collection Time: 01/30/18  8:37 AM  Result Value Ref Range   Cholesterol 149 0 - 200 mg/dL    Comment: ATP III Classification       Desirable:  < 200 mg/dL               Borderline High:  200 - 239 mg/dL          High:  > = 240 mg/dL   Triglycerides 113.0 0.0 - 149.0 mg/dL    Comment: Normal:  <150 mg/dLBorderline High:  150 - 199 mg/dL   HDL 40.40 >39.00 mg/dL   VLDL 22.6 0.0 - 40.0 mg/dL   LDL Cholesterol 86 0 - 99 mg/dL   Total CHOL/HDL Ratio 4     Comment:                Men          Women1/2 Average Risk     3.4          3.3Average Risk          5.0          4.42X Average Risk          9.6          7.13X Average Risk          15.0          11.0                       NonHDL 108.61     Comment: NOTE:  Non-HDL goal should be 30 mg/dL higher than patient's LDL goal (i.e. LDL goal of < 70 mg/dL, would have non-HDL goal of < 100 mg/dL)  Hepatic function panel     Status: None   Collection Time: 01/30/18  8:37 AM  Result Value Ref Range   Total Bilirubin 0.5 0.2 - 1.2 mg/dL   Bilirubin, Direct 0.1 0.0 - 0.3 mg/dL   Alkaline Phosphatase 71 39 - 117 U/L   AST 17 0 - 37 U/L   ALT 16 0 - 35 U/L   Total Protein 6.7 6.0 - 8.3 g/dL   Albumin 3.9 3.5 - 5.2 g/dL  Hemoglobin A1c     Status: Abnormal   Collection Time: 01/30/18  8:37 AM  Result Value Ref Range   Hgb A1c MFr Bld 8.1 (H) 4.6 - 6.5 %    Comment: Glycemic  Control Guidelines for People with Diabetes:Non Diabetic:  <6%Goal of Therapy: <7%Additional Action Suggested:  >8%   Microalbumin / creatinine urine ratio     Status: Abnormal   Collection Time: 02/01/18  3:49 PM  Result Value Ref Range   Microalb, Ur 3.5 (H) 0.0 -  1.9 mg/dL   Creatinine,U 109.1 mg/dL   Microalb Creat Ratio 3.2 0.0 - 30.0 mg/g    Assessment/Plan:  Hypercholesterolemia lipid control important in reducing the progression of atherosclerotic disease. Continue statin therapy   Type 2 diabetes mellitus, uncontrolled blood glucose control important in reducing the progression of atherosclerotic disease. Also, involved in wound healing. On appropriate medications.   Hypertension blood pressure control important in reducing the progression of atherosclerotic disease. On appropriate oral medications.   Carotid stenosis she had velocities consistent with 60 to 79% ICA stenosis bilaterally  Recommend:  Given the patient's asymptomatic subcritical stenosis no further invasive testing or surgery at this time.  Duplex ultrasound shows 60-79% stenosis bilaterally.  Continue antiplatelet therapy as prescribed Continue management of CAD, HTN and Hyperlipidemia Healthy heart diet,  encouraged exercise at least 4 times per week Follow up in 4-6 months with duplex ultrasound and physical exam       Leotis Pain 02/23/2018, 1:40 PM   This note was created with Dragon medical transcription system.  Any errors from dictation are unintentional.

## 2018-02-23 NOTE — Patient Instructions (Signed)
Carotid Artery Disease  The carotid arteries are arteries on both sides of the neck. They carry blood to the brain, face, and neck. Carotid artery disease happens when these arteries become smaller (narrow) or get blocked. If these arteries become smaller or get blocked, you are more likely to have a stroke or a warning stroke (transient ischemic attack). Follow these instructions at home:  Take over-the-counter and prescription medicines only as told by your doctor.  Make sure you understand all instructions about your medicines. Do not stop taking your medicines without talking to your doctor first.  Follow your doctor's diet instructions. It is important to follow a healthy diet. ? Eat foods that include plenty of: ? Fresh fruits. ? Vegetables. ? Lean meats. ? Avoid these foods: ? Foods that are high in fat. ? Foods that are high in salt (sodium). ? Foods that are fried. ? Foods that are processed. ? Foods that have few good nutrients (poor nutritional value).  Keep a healthy weight.  Stay active. Get at least 30 minutes of activity every day.  Do not smoke.  Limit alcohol use to: ? No more than 2 drinks a day for men. ? No more than 1 drink a day for women who are not pregnant.  Do not use illegal drugs.  Keep all follow-up visits as told by your doctor. This is important. Contact a doctor if: Get help right away if:  You have any symptoms of stroke or TIA. The acronym BEFAST is an easy way to remember the main warning signs of stroke. ? B = Balance problems. Signs include dizziness, sudden trouble walking, or loss of balance ? E = Eye problems. This includes trouble seeing or a sudden change in vision. ? F = Face changes. This includes sudden weakness or numbness of the face, or the face or eyelid drooping to one side. ? A = Arm weakness or numbness. This happens suddenly and usually on one side of the body. ? S = Speech problems. This includes trouble speaking or  trouble understanding. ? T = Time. Time to call 911 or seek emergency care. Do not wait to see if symptoms go away. Make note of the time your symptoms started.  Other signs of stroke may include: ? A sudden, severe headache with no known cause. ? Feeling sick to your stomach (nauseous) or throwing up (vomiting). ? Seizure. Call your local emergency services (911 in [place] Do notdrive yourself to the clinic or hospital. Summary  The carotid arteries are arteries on both sides of the neck.  If these arteries get smaller or get blocked, you are more likely to have a stroke or a warning stroke (transient ischemic attack).  Take over-the-counter and prescription medicines only as told by your doctor.  Keep all follow-up visits as told by your doctor. This is important. This information is not intended to replace advice given to you by your health care provider. Make sure you discuss any questions you have with your health care provider. Document Released: 12/07/2011 Document Revised: 12/15/2016 Document Reviewed: 12/15/2016 Elsevier Interactive Patient Education  2019 [employer]vier Inc.  

## 2018-02-23 NOTE — Assessment & Plan Note (Signed)
she had velocities consistent with 60 to 79% ICA stenosis bilaterally  Recommend:  Given the patient's asymptomatic subcritical stenosis no further invasive testing or surgery at this time.  Duplex ultrasound shows 60-79% stenosis bilaterally.  Continue antiplatelet therapy as prescribed Continue management of CAD, HTN and Hyperlipidemia Healthy heart diet,  encouraged exercise at least 4 times per week Follow up in 4-6 months with duplex ultrasound and physical exam

## 2018-02-23 NOTE — Assessment & Plan Note (Signed)
lipid control important in reducing the progression of atherosclerotic disease. Continue statin therapy  

## 2018-02-23 NOTE — Assessment & Plan Note (Signed)
blood pressure control important in reducing the progression of atherosclerotic disease. On appropriate oral medications.  

## 2018-02-26 ENCOUNTER — Ambulatory Visit
Admission: RE | Admit: 2018-02-26 | Discharge: 2018-02-26 | Disposition: A | Discharge status: Home / Self Care | Payer: PPO | Source: Ambulatory Visit | Attending: Internal Medicine | Admitting: Internal Medicine

## 2018-02-26 DIAGNOSIS — Z1231 Encounter for screening mammogram for malignant neoplasm of breast: Secondary | ICD-10-CM | POA: Insufficient documentation

## 2018-02-26 DIAGNOSIS — Z1239 Encounter for other screening for malignant neoplasm of breast: Secondary | ICD-10-CM

## 2018-02-27 DIAGNOSIS — J301 Allergic rhinitis due to pollen: Secondary | ICD-10-CM | POA: Diagnosis not present

## 2018-02-28 DIAGNOSIS — J301 Allergic rhinitis due to pollen: Secondary | ICD-10-CM | POA: Diagnosis not present

## 2018-03-06 DIAGNOSIS — J301 Allergic rhinitis due to pollen: Secondary | ICD-10-CM | POA: Diagnosis not present

## 2018-03-09 DIAGNOSIS — M1612 Unilateral primary osteoarthritis, left hip: Secondary | ICD-10-CM | POA: Diagnosis not present

## 2018-03-13 DIAGNOSIS — J301 Allergic rhinitis due to pollen: Secondary | ICD-10-CM | POA: Diagnosis not present

## 2018-03-14 DIAGNOSIS — M5416 Radiculopathy, lumbar region: Secondary | ICD-10-CM | POA: Diagnosis not present

## 2018-03-14 DIAGNOSIS — M5136 Other intervertebral disc degeneration, lumbar region: Secondary | ICD-10-CM | POA: Diagnosis not present

## 2018-03-14 DIAGNOSIS — M1612 Unilateral primary osteoarthritis, left hip: Secondary | ICD-10-CM | POA: Diagnosis not present

## 2018-03-20 DIAGNOSIS — H353132 Nonexudative age-related macular degeneration, bilateral, intermediate dry stage: Secondary | ICD-10-CM | POA: Diagnosis not present

## 2018-03-20 DIAGNOSIS — J301 Allergic rhinitis due to pollen: Secondary | ICD-10-CM | POA: Diagnosis not present

## 2018-03-29 ENCOUNTER — Other Ambulatory Visit: Payer: Self-pay | Admitting: Internal Medicine

## 2018-03-29 ENCOUNTER — Telehealth: Payer: Self-pay

## 2018-03-29 DIAGNOSIS — R1907 Generalized intra-abdominal and pelvic swelling, mass and lump: Secondary | ICD-10-CM

## 2018-03-29 DIAGNOSIS — R937 Abnormal findings on diagnostic imaging of other parts of musculoskeletal system: Secondary | ICD-10-CM

## 2018-03-29 NOTE — Telephone Encounter (Signed)
Order placed for CT scan.  

## 2018-03-29 NOTE — Progress Notes (Signed)
Order placed for CT scan.  

## 2018-03-29 NOTE — Telephone Encounter (Signed)
Spoke with patient regarding x-ray results. Patient is agreeable to CT. Advised that this was not something urgent and there may be a delay in scheduling. Patient was okay with that and stated she was not in a hurry to have it done.

## 2018-03-29 NOTE — Telephone Encounter (Signed)
See other note

## 2018-04-12 ENCOUNTER — Other Ambulatory Visit: Payer: Self-pay | Admitting: Internal Medicine

## 2018-04-30 ENCOUNTER — Ambulatory Visit
Admission: RE | Admit: 2018-04-30 | Discharge: 2018-04-30 | Disposition: A | Discharge status: Home / Self Care | Payer: PPO | Source: Ambulatory Visit | Attending: Internal Medicine | Admitting: Internal Medicine

## 2018-04-30 ENCOUNTER — Other Ambulatory Visit: Payer: Self-pay

## 2018-04-30 DIAGNOSIS — R937 Abnormal findings on diagnostic imaging of other parts of musculoskeletal system: Secondary | ICD-10-CM

## 2018-04-30 DIAGNOSIS — R1907 Generalized intra-abdominal and pelvic swelling, mass and lump: Secondary | ICD-10-CM | POA: Diagnosis not present

## 2018-04-30 DIAGNOSIS — N200 Calculus of kidney: Secondary | ICD-10-CM | POA: Diagnosis not present

## 2018-05-03 ENCOUNTER — Telehealth: Payer: Self-pay

## 2018-05-03 NOTE — Telephone Encounter (Signed)
See result note.  

## 2018-05-03 NOTE — Telephone Encounter (Signed)
Copied from [place] [phone]. Topic: General - Inquiry >> May 03, 2018  1:52 PM [place], [place] D wrote: Reason for CRM: Pt called to follow up on results of her CT done 04/30/18. Please advise.

## 2018-05-04 ENCOUNTER — Other Ambulatory Visit: Payer: Self-pay | Admitting: Internal Medicine

## 2018-05-04 DIAGNOSIS — N200 Calculus of kidney: Secondary | ICD-10-CM

## 2018-05-04 DIAGNOSIS — N1330 Unspecified hydronephrosis: Secondary | ICD-10-CM

## 2018-05-04 NOTE — Progress Notes (Signed)
Order placed for urology referral.  

## 2018-05-08 DIAGNOSIS — J301 Allergic rhinitis due to pollen: Secondary | ICD-10-CM | POA: Diagnosis not present

## 2018-05-15 ENCOUNTER — Other Ambulatory Visit: Payer: Self-pay

## 2018-05-15 ENCOUNTER — Encounter: Payer: Self-pay | Admitting: Urology

## 2018-05-15 ENCOUNTER — Ambulatory Visit (INDEPENDENT_AMBULATORY_CARE_PROVIDER_SITE_OTHER): Payer: PPO | Admitting: Urology

## 2018-05-15 VITALS — BP 154/76 | HR 70 | Ht 64.0 in | Wt 191.0 lb

## 2018-05-15 DIAGNOSIS — N200 Calculus of kidney: Secondary | ICD-10-CM | POA: Diagnosis not present

## 2018-05-15 DIAGNOSIS — N1330 Unspecified hydronephrosis: Secondary | ICD-10-CM

## 2018-05-15 DIAGNOSIS — J301 Allergic rhinitis due to pollen: Secondary | ICD-10-CM | POA: Diagnosis not present

## 2018-05-15 NOTE — Progress Notes (Signed)
05/15/2018 3:50 PM   Marissa Huff 03-23-1931 [id number]  Referring provider: Einar Huff, [address] [place], [place] Huff [id number]  Chief Complaint  Patient presents with  . Establish Care    HPI: Marissa Huff is an 85 year old female seen at the request of Dr. Nicki Huff for evaluation of right hydronephrosis and nephrolithiasis.  She had a CT of the abdomen and pelvis without contrast performed on 04/30/2018 which showed chronic right hydronephrosis and an 8 x 10 mm calculus in the right renal pelvis as well as a 12 mm calyceal calculus.  It was felt the 8 mm calculus was in the mid ureter on prior lumbar spine films in 2017 and 2020.  She is completely asymptomatic and denies flank or abdominal pain.  She has a long history of recurrent UTIs and was followed by Dr. Jacqlyn Huff.  Her UTIs have recently been minimal.  She was noted to have right hydronephrosis back in 2014 and 2 small nonobstructing right renal calculi.  She was followed by Dr. Jacqlyn Huff regularly and this was felt to be secondary to an asymptomatic UPJ obstruction.  She was monitored with serial KUBs and last saw Dr. Jacqlyn Huff in July 2018.  KUB at that time showed two 8 mm calculi overlying the right renal outline and there was no calcification overlying the bony pelvis.  Serum creatinine January 2020 was stable at 0.84.  PMH: Past Medical History:  Diagnosis Date  . Allergy   . Arthritis   . Carpal tunnel syndrome, bilateral   . Diabetes mellitus without complication ([place])   . Diverticulosis   . 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 Gastroparesis   . GERD (gastroesophageal reflux disease)   . Hypertension   . Hypothyroidism   . PUD (peptic ulcer disease)   . Skin cancer   . Spinal stenosis    lumbar laminectomy    Surgical History: Past Surgical History:  Procedure Laterality Date  . ABDOMINAL HYSTERECTOMY    . antrectomy and vagotomy  1994  . arthroscopic left knee  12/05/08  . BACK SURGERY    .  carpal tunnel repai  10/10   left   . CATARACT EXTRACTION, BILATERAL    . EYE SURGERY     bilateral cataracts  . 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 LAPAROSCOPIC CHOLECYSTECTOMY  6/01  . LUMBAR LAMINECTOMY  1999  . LUMBAR LAMINECTOMY WITH COFLEX 1 LEVEL N/A 09/15/2015   Procedure: Lumbar one-two Laminectomy with placement of coflex;  Surgeon: Marissa Miss, MD;  Location: [facility];  Service: Neurosurgery;  Laterality: N/A;  . PARTIAL HYSTERECTOMY  1960   fibroid tumors, ovaries not removed  . tonsillectomy    . TONSILLECTOMY      Home Medications:  Allergies as of 05/15/2018      Reactions   Penicillins Rash      Medication List       Accurate as of May 15, 2018  3:50 PM. If you have any questions, ask your nurse or doctor.        STOP taking these medications   accu-chek soft touch lancets Stopped by:  Abbie Sons, MD   blood glucose meter kit and supplies Kit Stopped by:  Abbie Sons, MD   insulin lispro 100 UNIT/ML KiwkPen Commonly known as:  HumaLOG KwikPen Stopped by:  Abbie Sons, MD   ONE TOUCH ULTRA TEST test strip Generic drug:  glucose blood Stopped by:  Abbie Sons,  MD     TAKE these medications   albuterol 108 (90 Base) MCG/ACT inhaler Commonly known as:  VENTOLIN HFA Inhale 2 puffs into the lungs every 6 (six) hours as needed for wheezing or shortness of breath.   amLODipine 5 MG tablet Commonly known as:  NORVASC TAKE 1 TABLET (5 MG TOTAL) BY MOUTH DAILY.   aspirin 81 MG tablet Take 81 mg by mouth daily.   BD Pen Needle Nano U/F 32G X 4 MM Misc Generic drug:  Insulin Pen Needle USE 1 EACH 4 (FOUR) TIMES DAILY   CALCIUM + D PO Take 1 tablet by mouth daily.   fexofenadine 180 MG tablet Commonly known as:  ALLEGRA Take 180 mg by mouth daily.   fluticasone 50 MCG/ACT nasal spray Commonly known as:  FLONASE SPRAY 2 SPRAYS INTO EACH NOSTRIL EVERY DAY   FreeStyle Libre 14 Day Reader [facility] Use 1 each as needed .  Use to  test blood sugar   FreeStyle Libre 14 Day Sensor Misc Use 1 each every 14 (fourteen) days   glucagon 1 MG injection Commonly known as:  Glucagon Emergency Inject 1 mg into the muscle once as needed.   Lantus SoloStar 100 UNIT/ML Solostar Pen Generic drug:  Insulin Glargine INJECT 24UNITS AT BEDTIME   levothyroxine 150 MCG tablet Commonly known as:  SYNTHROID TAKE 1 TABLET (150 MCG TOTAL) BY MOUTH DAILY.   LORazepam 0.5 MG tablet Commonly known as:  ATIVAN Take 0.5-1 tablets (0.25-0.5 mg total) by mouth daily as needed for anxiety.   metFORMIN 500 MG 24 hr tablet Commonly known as:  GLUCOPHAGE-XR TAKE 1 TABLET (500 MG TOTAL) BY MOUTH 2 (TWO) TIMES DAILY.   MULTIVITAMIN PO Take 1 tablet by mouth daily.   NovoLOG FlexPen 100 UNIT/ML FlexPen Generic drug:  insulin aspart INJECT 10 UNITS SUBCUTANEOUSLY 3 (THREE) TIMES DAILY WITH MEALS   NOVOLOG MIX 70/30 [place] Inject 11 Units into the skin 3 (three) times daily with meals.   pantoprazole 40 MG tablet Commonly known as:  PROTONIX TAKE 1 TABLET BY MOUTH TWICE A DAY   traMADol 50 MG tablet Commonly known as:  ULTRAM Take 1 tablet (50 mg total) by mouth 2 (two) times daily as needed for moderate pain.   valsartan-hydrochlorothiazide 160-12.5 MG tablet Commonly known as:  DIOVAN-HCT TAKE 1 TABLET BY MOUTH DAILY.       Allergies:  Allergies  Allergen Reactions  . Penicillins Rash    Family History: Family History  Problem Relation Age of Onset  . Stroke Mother   . Stroke Father   . Hypertension Father   . Diabetes Brother   . Multiple sclerosis Brother   . Breast cancer Daughter 70  . Colon cancer Neg Hx     Social History:  reports that she has never smoked. She has never used smokeless tobacco. She reports that she does not drink alcohol or use drugs.  ROS: UROLOGY Frequent Urination?: No Hard to postpone urination?: No Burning/pain with urination?: No Get up at night to urinate?: Yes Leakage of urine?:  Yes Urine stream starts and stops?: No Trouble starting stream?: No Do you have to strain to urinate?: No Blood in urine?: No Urinary tract infection?: No Sexually transmitted disease?: No Injury to kidneys or bladder?: No Painful intercourse?: No Weak stream?: No Currently pregnant?: No Vaginal bleeding?: No Last menstrual period?: Postmenopausal  Gastrointestinal Nausea?: No Vomiting?: No Indigestion/heartburn?: No Diarrhea?: No Constipation?: No  Constitutional Fever: No Night sweats?: No Weight loss?:  No Fatigue?: No  Skin Skin rash/lesions?: No Itching?: No  Eyes Blurred vision?: No Double vision?: No  Ears/Nose/Throat Sore throat?: No Sinus problems?: No  Hematologic/Lymphatic Swollen glands?: No Easy bruising?: No  Cardiovascular Leg swelling?: No Chest pain?: No  Respiratory Cough?: No Shortness of breath?: No  Endocrine Excessive thirst?: No  Musculoskeletal Back pain?: Yes Joint pain?: No  Neurological Headaches?: No Dizziness?: No  Psychologic Depression?: No Anxiety?: No  Physical Exam: BP (!) 154/76 (BP Location: Left Arm, Patient Position: Sitting, Cuff Size: Normal)   Pulse 70   Ht _0  (1.626 m)   Wt 191 lb (86.6 kg)   BMI 32.79 kg/m   Constitutional:  Alert and oriented, No acute distress. HEENT: [place] AT, moist mucus membranes.  Trachea midline, no masses. Cardiovascular: No clubbing, cyanosis, or edema. Respiratory: Normal respiratory effort, no increased work of breathing. GI: Abdomen is soft, nontender, nondistended, no abdominal masses GU: No CVA tenderness Lymph: No cervical or inguinal lymphadenopathy. Skin: No rashes, bruises or suspicious lesions. Neurologic: Grossly intact, no focal deficits, moving all 4 extremities. Psychiatric: Normal mood and affect.  Laboratory Data: Lab Results  Component Value Date   WBC 4.6 05/02/2017   HGB 12.0 05/02/2017   HCT 34.9 (L) 05/02/2017   MCV 91.2 05/02/2017   PLT  221.0 05/02/2017    Lab Results  Component Value Date   CREATININE 0.84 01/30/2018    Pertinent Imaging: CT scans 2014 and 2020 were personally reviewed  Assessment & Plan:   85 year old female with chronic right hydronephrosis consistent with UPJ obstruction.  She is asymptomatic and has been followed with hydronephrosis and urinary calculi for several years.  She is reluctant to undergo any treatment based on her age and lack of symptoms at this time.  It does not look like she has had a prior renal scan and have recommended scheduling to assess function of her right kidney as well as obstruction.  She will be contacted with renal scan results and further recommendations at that time.   Abbie Sons, [place] [address] [place], [place] [address] [phone]

## 2018-05-22 ENCOUNTER — Other Ambulatory Visit: Payer: Self-pay | Admitting: Internal Medicine

## 2018-05-22 DIAGNOSIS — J301 Allergic rhinitis due to pollen: Secondary | ICD-10-CM | POA: Diagnosis not present

## 2018-05-29 ENCOUNTER — Telehealth: Payer: Self-pay

## 2018-05-29 DIAGNOSIS — J301 Allergic rhinitis due to pollen: Secondary | ICD-10-CM | POA: Diagnosis not present

## 2018-05-29 NOTE — Telephone Encounter (Signed)
Called patient from recall list.  Patient does not wish to schedule with [place].  Pt will call us if she has any issues.  Will delete recall.

## 2018-06-01 ENCOUNTER — Other Ambulatory Visit: Payer: Self-pay

## 2018-06-01 ENCOUNTER — Other Ambulatory Visit (INDEPENDENT_AMBULATORY_CARE_PROVIDER_SITE_OTHER): Payer: PPO

## 2018-06-01 DIAGNOSIS — E7800 Pure hypercholesterolemia, unspecified: Secondary | ICD-10-CM

## 2018-06-01 DIAGNOSIS — E1165 Type 2 diabetes mellitus with hyperglycemia: Secondary | ICD-10-CM | POA: Diagnosis not present

## 2018-06-01 DIAGNOSIS — I10 Essential (primary) hypertension: Secondary | ICD-10-CM

## 2018-06-01 LAB — CBC WITH DIFFERENTIAL/PLATELET
Basophils Absolute: 0 10*3/uL (ref 0.0–0.1)
Basophils Relative: 0.7 % (ref 0.0–3.0)
Eosinophils Absolute: 0.2 10*3/uL (ref 0.0–0.7)
Eosinophils Relative: 2.9 % (ref 0.0–5.0)
HCT: 35.4 % — ABNORMAL LOW (ref 36.0–46.0)
Hemoglobin: 12.4 g/dL (ref 12.0–15.0)
Lymphocytes Relative: 19.5 % (ref 12.0–46.0)
Lymphs Abs: 1.3 10*3/uL (ref 0.7–4.0)
MCHC: 34.9 g/dL (ref 30.0–36.0)
MCV: 91.3 fl (ref 78.0–100.0)
Monocytes Absolute: 0.6 10*3/uL (ref 0.1–1.0)
Monocytes Relative: 9.1 % (ref 3.0–12.0)
Neutro Abs: 4.4 10*3/uL (ref 1.4–7.7)
Neutrophils Relative %: 67.8 % (ref 43.0–77.0)
Platelets: 230 10*3/uL (ref 150.0–400.0)
RBC: 3.88 Mil/uL (ref 3.87–5.11)
RDW: 14 % (ref 11.5–15.5)
WBC: 6.5 10*3/uL (ref 4.0–10.5)

## 2018-06-01 LAB — HEPATIC FUNCTION PANEL
ALT: 16 U/L (ref 0–35)
AST: 13 U/L (ref 0–37)
Albumin: 3.9 g/dL (ref 3.5–5.2)
Alkaline Phosphatase: 91 U/L (ref 39–117)
Bilirubin, Direct: 0.1 mg/dL (ref 0.0–0.3)
Total Bilirubin: 0.4 mg/dL (ref 0.2–1.2)
Total Protein: 6.5 g/dL (ref 6.0–8.3)

## 2018-06-01 LAB — BASIC METABOLIC PANEL
BUN: 21 mg/dL (ref 6–23)
CO2: 23 mEq/L (ref 19–32)
Calcium: 9.5 mg/dL (ref 8.4–10.5)
Chloride: 105 mEq/L (ref 96–112)
Creatinine, Ser: 0.78 mg/dL (ref 0.40–1.20)
GFR: 70.15 mL/min (ref >60.00)
Glucose, Bld: 251 mg/dL — ABNORMAL HIGH (ref 70–99)
Potassium: 3.9 mEq/L (ref 3.5–5.1)
Sodium: 140 mEq/L (ref 135–145)

## 2018-06-01 LAB — LIPID PANEL
Cholesterol: 126 mg/dL (ref 0–200)
HDL: 41.9 mg/dL (ref >39.00)
LDL Cholesterol: 60 mg/dL (ref 0–99)
NonHDL: 84.29
Total CHOL/HDL Ratio: 3
Triglycerides: 123 mg/dL (ref 0.0–149.0)
VLDL: 24.6 mg/dL (ref 0.0–40.0)

## 2018-06-01 LAB — TSH: TSH: 2.18 u[IU]/mL (ref 0.35–4.50)

## 2018-06-01 LAB — HEMOGLOBIN A1C: Hgb A1c MFr Bld: 7.7 % — ABNORMAL HIGH (ref 4.6–6.5)

## 2018-06-03 ENCOUNTER — Encounter: Payer: Self-pay | Admitting: Internal Medicine

## 2018-06-05 ENCOUNTER — Ambulatory Visit: Payer: PPO | Admitting: Internal Medicine

## 2018-06-05 DIAGNOSIS — J301 Allergic rhinitis due to pollen: Secondary | ICD-10-CM | POA: Diagnosis not present

## 2018-06-07 ENCOUNTER — Other Ambulatory Visit: Payer: Self-pay | Admitting: Internal Medicine

## 2018-06-08 DIAGNOSIS — E785 Hyperlipidemia, unspecified: Secondary | ICD-10-CM | POA: Diagnosis not present

## 2018-06-08 DIAGNOSIS — E039 Hypothyroidism, unspecified: Secondary | ICD-10-CM | POA: Diagnosis not present

## 2018-06-08 DIAGNOSIS — E1159 Type 2 diabetes mellitus with other circulatory complications: Secondary | ICD-10-CM | POA: Diagnosis not present

## 2018-06-08 DIAGNOSIS — E1029 Type 1 diabetes mellitus with other diabetic kidney complication: Secondary | ICD-10-CM | POA: Diagnosis not present

## 2018-06-08 DIAGNOSIS — R809 Proteinuria, unspecified: Secondary | ICD-10-CM | POA: Diagnosis not present

## 2018-06-08 DIAGNOSIS — E1069 Type 1 diabetes mellitus with other specified complication: Secondary | ICD-10-CM | POA: Diagnosis not present

## 2018-06-08 DIAGNOSIS — I10 Essential (primary) hypertension: Secondary | ICD-10-CM | POA: Diagnosis not present

## 2018-06-12 DIAGNOSIS — J301 Allergic rhinitis due to pollen: Secondary | ICD-10-CM | POA: Diagnosis not present

## 2018-06-14 ENCOUNTER — Encounter
Admission: RE | Admit: 2018-06-14 | Discharge: 2018-06-14 | Disposition: A | Discharge status: Home / Self Care | Payer: PPO | Source: Ambulatory Visit | Attending: Urology | Admitting: Urology

## 2018-06-14 ENCOUNTER — Other Ambulatory Visit: Payer: Self-pay

## 2018-06-14 DIAGNOSIS — N1330 Unspecified hydronephrosis: Secondary | ICD-10-CM

## 2018-06-14 MED ORDER — TECHNETIUM TC 99M MERTIATIDE
5.3240 | Freq: Once | INTRAVENOUS | Status: AC | PRN
  Administered 2018-06-14: 5.324 via INTRAVENOUS

## 2018-06-14 MED ORDER — FUROSEMIDE 10 MG/ML IJ SOLN
43.3000 mg | Freq: Once | INTRAMUSCULAR | Status: AC
  Administered 2018-06-14: 43.3 mg via INTRAVENOUS
  Filled 2018-06-14: qty 4.3

## 2018-06-15 ENCOUNTER — Telehealth: Payer: Self-pay | Admitting: Family Medicine

## 2018-06-15 NOTE — Telephone Encounter (Signed)
Patient notified and appointment has been scheduled 

## 2018-06-15 NOTE — Telephone Encounter (Signed)
-----   Message from Abbie Sons, MD sent at 06/15/2018  8:56 AM EDT ----- Lasix renal scan does show obstruction of the right kidney.  Please schedule telephone visit to discuss management options.

## 2018-06-18 ENCOUNTER — Telehealth (INDEPENDENT_AMBULATORY_CARE_PROVIDER_SITE_OTHER): Payer: PPO | Admitting: Urology

## 2018-06-18 ENCOUNTER — Other Ambulatory Visit: Payer: Self-pay

## 2018-06-18 DIAGNOSIS — N200 Calculus of kidney: Secondary | ICD-10-CM

## 2018-06-18 DIAGNOSIS — Q6211 Congenital occlusion of ureteropelvic junction: Secondary | ICD-10-CM

## 2018-06-18 NOTE — Progress Notes (Signed)
Virtual Visit via Telephone Note  I connected with [name] on 06/18/18 at  1:30 PM EDT by telephone and verified that I am speaking with the correct person using two identifiers.  Location: Patient: Home Provider: Office   I discussed the limitations, risks, security and privacy concerns of performing an evaluation and management service by telephone and the availability of in person appointments. I also discussed with the patient that there may be a patient responsible charge related to this service. The patient expressed understanding and agreed to proceed.   History of Present Illness: 85 year old female presents for follow-up of right hydronephrosis.  Refer to my previous office note dated 05/15/2018.  She remains asymptomatic and denies flank, abdominal pain or gross hematuria.  Diuretic renal scan performed on 06/14/2018 was felt to show right obstruction.  Differential function was 61% left/39% right.   Observations/Objective: N/A  Assessment and Plan: We will see her back approximately 1 year and she was instructed to call earlier for development of flank/abdominal pain.  Follow Up Instructions: 85 year old female with a symptomatic right UPJ obstruction with right nephrolithiasis.  This was originally noted in 2014.  This was previously observed by Dr. Jacqlyn Larsen.  Options were discussed including continued surveillance, operative management and stent placement.  Based on her age and lack of symptoms that she desires to continue surveillance.  We will see her back approximately 1 year and she was instructed to call earlier for development of flank/abdominal pain.   I discussed the assessment and treatment plan with the patient. The patient was provided an opportunity to ask questions and all were answered. The patient agreed with the plan and demonstrated an understanding of the instructions.   The patient was advised to call back or seek an in-person evaluation if the symptoms  worsen or if the condition fails to improve as anticipated.  I provided 10 minutes of non-face-to-face time during this encounter.   Abbie Sons, MD

## 2018-06-19 DIAGNOSIS — J301 Allergic rhinitis due to pollen: Secondary | ICD-10-CM | POA: Diagnosis not present

## 2018-06-20 DIAGNOSIS — M5416 Radiculopathy, lumbar region: Secondary | ICD-10-CM | POA: Diagnosis not present

## 2018-06-20 DIAGNOSIS — M5136 Other intervertebral disc degeneration, lumbar region: Secondary | ICD-10-CM | POA: Diagnosis not present

## 2018-06-20 DIAGNOSIS — M1612 Unilateral primary osteoarthritis, left hip: Secondary | ICD-10-CM | POA: Diagnosis not present

## 2018-06-26 ENCOUNTER — Encounter (INDEPENDENT_AMBULATORY_CARE_PROVIDER_SITE_OTHER): Payer: PPO

## 2018-06-26 ENCOUNTER — Ambulatory Visit (INDEPENDENT_AMBULATORY_CARE_PROVIDER_SITE_OTHER): Payer: PPO | Admitting: Vascular Surgery

## 2018-06-26 DIAGNOSIS — J301 Allergic rhinitis due to pollen: Secondary | ICD-10-CM | POA: Diagnosis not present

## 2018-07-03 DIAGNOSIS — J301 Allergic rhinitis due to pollen: Secondary | ICD-10-CM | POA: Diagnosis not present

## 2018-07-10 DIAGNOSIS — J301 Allergic rhinitis due to pollen: Secondary | ICD-10-CM | POA: Diagnosis not present

## 2018-07-17 DIAGNOSIS — J301 Allergic rhinitis due to pollen: Secondary | ICD-10-CM | POA: Diagnosis not present

## 2018-07-24 ENCOUNTER — Ambulatory Visit (INDEPENDENT_AMBULATORY_CARE_PROVIDER_SITE_OTHER): Payer: PPO

## 2018-07-24 ENCOUNTER — Ambulatory Visit (INDEPENDENT_AMBULATORY_CARE_PROVIDER_SITE_OTHER): Payer: PPO | Admitting: Vascular Surgery

## 2018-07-24 ENCOUNTER — Encounter (INDEPENDENT_AMBULATORY_CARE_PROVIDER_SITE_OTHER): Payer: Self-pay | Admitting: Vascular Surgery

## 2018-07-24 ENCOUNTER — Other Ambulatory Visit: Payer: Self-pay

## 2018-07-24 VITALS — BP 147/77 | HR 67 | Resp 16 | Ht 63.0 in | Wt 189.0 lb

## 2018-07-24 DIAGNOSIS — Z7984 Long term (current) use of oral hypoglycemic drugs: Secondary | ICD-10-CM | POA: Diagnosis not present

## 2018-07-24 DIAGNOSIS — I10 Essential (primary) hypertension: Secondary | ICD-10-CM | POA: Diagnosis not present

## 2018-07-24 DIAGNOSIS — E1165 Type 2 diabetes mellitus with hyperglycemia: Secondary | ICD-10-CM

## 2018-07-24 DIAGNOSIS — Z79899 Other long term (current) drug therapy: Secondary | ICD-10-CM

## 2018-07-24 DIAGNOSIS — I6523 Occlusion and stenosis of bilateral carotid arteries: Secondary | ICD-10-CM

## 2018-07-24 DIAGNOSIS — E7800 Pure hypercholesterolemia, unspecified: Secondary | ICD-10-CM | POA: Diagnosis not present

## 2018-07-24 DIAGNOSIS — J301 Allergic rhinitis due to pollen: Secondary | ICD-10-CM | POA: Diagnosis not present

## 2018-07-24 NOTE — Assessment & Plan Note (Signed)
Carotid duplex today actually show better velocities than at her last visit which now follow in the 40 to 59% range bilaterally.  Previously, she was in the 60 to 79% range bilaterally. Continue current medical regimen.  No role for intervention at this level.  I will do another 64-month checkup given the moderate findings previously and the higher end of the 40 to 59% range today.

## 2018-07-24 NOTE — Patient Instructions (Signed)
Carotid Artery Disease  The carotid arteries are arteries on both sides of the neck. They carry blood to the brain, face, and neck. Carotid artery disease happens when these arteries become smaller (narrow) or get blocked. If these arteries become smaller or get blocked, you are more likely to have a stroke or a warning stroke (transient ischemic attack). Follow these instructions at home:  Take over-the-counter and prescription medicines only as told by your doctor.  Make sure you understand all instructions about your medicines. Do not stop taking your medicines without talking to your doctor first.  Follow your doctor's diet instructions. It is important to follow a healthy diet. ? Eat foods that include plenty of: ? Fresh fruits. ? Vegetables. ? Lean meats. ? Avoid these foods: ? Foods that are high in fat. ? Foods that are high in salt (sodium). ? Foods that are fried. ? Foods that are processed. ? Foods that have few good nutrients (poor nutritional value).  Keep a healthy weight.  Stay active. Get at least 30 minutes of activity every day.  Do not smoke.  Limit alcohol use to: ? No more than 2 drinks a day for men. ? No more than 1 drink a day for women who are not pregnant.  Do not use illegal drugs.  Keep all follow-up visits as told by your doctor. This is important. Contact a doctor if: Get help right away if:  You have any symptoms of stroke or TIA. The acronym BEFAST is an easy way to remember the main warning signs of stroke. ? B = Balance problems. Signs include dizziness, sudden trouble walking, or loss of balance ? E = Eye problems. This includes trouble seeing or a sudden change in vision. ? F = Face changes. This includes sudden weakness or numbness of the face, or the face or eyelid drooping to one side. ? A = Arm weakness or numbness. This happens suddenly and usually on one side of the body. ? S = Speech problems. This includes trouble speaking or  trouble understanding. ? T = Time. Time to call 911 or seek emergency care. Do not wait to see if symptoms go away. Make note of the time your symptoms started.  Other signs of stroke may include: ? A sudden, severe headache with no known cause. ? Feeling sick to your stomach (nauseous) or throwing up (vomiting). ? Seizure. Call your local emergency services (911 in [place] Do notdrive yourself to the clinic or hospital. Summary  The carotid arteries are arteries on both sides of the neck.  If these arteries get smaller or get blocked, you are more likely to have a stroke or a warning stroke (transient ischemic attack).  Take over-the-counter and prescription medicines only as told by your doctor.  Keep all follow-up visits as told by your doctor. This is important. This information is not intended to replace advice given to you by your health care provider. Make sure you discuss any questions you have with your health care provider. Document Released: 12/07/2011 Document Revised: 12/15/2016 Document Reviewed: 12/15/2016 Elsevier Patient Education  2020 Elsevier Inc.  

## 2018-07-24 NOTE — Progress Notes (Signed)
MRN : [id number]  Marissa Huff is a 85 y.o. (Sep 19, 1931) female who presents with chief complaint of  Chief Complaint  Patient presents with  . Follow-up    ultrasound  .  History of Present Illness: Patient returns in follow-up of her carotid disease.  She is doing well without any focal neurologic symptoms or other complaints at this time. Specifically, the patient denies amaurosis fugax, speech or swallowing difficulties, or arm or leg weakness or numbness Carotid duplex today actually show better velocities than at her last visit which now follow in the 40 to 59% range bilaterally.  Previously, she was in the 60 to 79% range bilaterally.  Current Outpatient Medications  Medication Sig Dispense Refill  . albuterol (PROVENTIL HFA;VENTOLIN HFA) 108 (90 BASE) MCG/ACT inhaler Inhale 2 puffs into the lungs every 6 (six) hours as needed for wheezing or shortness of breath. 1 Inhaler 1  . amLODipine (NORVASC) 5 MG tablet TAKE 1 TABLET (5 MG TOTAL) BY MOUTH DAILY. 90 tablet 1  . aspirin 81 MG tablet Take 81 mg by mouth daily.    . BD PEN NEEDLE NANO U/F 32G X 4 MM MISC USE 1 EACH 4 (FOUR) TIMES DAILY    . Continuous Blood Gluc Receiver (FREESTYLE LIBRE 14 DAY READER) DEVI Use 1 each as needed .  Use to test blood sugar    . Continuous Blood Gluc Sensor (FREESTYLE LIBRE 14 DAY SENSOR) MISC Use 1 each every 14 (fourteen) days    . fexofenadine (ALLEGRA) 180 MG tablet Take 180 mg by mouth daily.    . fluticasone (FLONASE) 50 MCG/ACT nasal spray SPRAY 2 SPRAYS INTO EACH NOSTRIL EVERY DAY 48 g 3  . glucagon (GLUCAGON EMERGENCY) 1 MG injection Inject 1 mg into the muscle once as needed. 1 each 3  . LANTUS SOLOSTAR 100 UNIT/ML Solostar Pen INJECT 24UNITS AT BEDTIME  4  . levothyroxine (SYNTHROID, LEVOTHROID) 150 MCG tablet TAKE 1 TABLET (150 MCG TOTAL) BY MOUTH DAILY. 90 tablet 3  . LORazepam (ATIVAN) 0.5 MG tablet Take 0.5-1 tablets (0.25-0.5 mg total) by mouth daily as needed for anxiety. 30  tablet 0  . metFORMIN (GLUCOPHAGE-XR) 500 MG 24 hr tablet TAKE 1 TABLET (500 MG TOTAL) BY MOUTH 2 (TWO) TIMES DAILY. 180 tablet 1  . Multiple Vitamins-Minerals (MULTIVITAMIN PO) Take 1 tablet by mouth daily.     [place] NOVOLOG FLEXPEN 100 UNIT/ML FlexPen INJECT 10 UNITS SUBCUTANEOUSLY 3 (THREE) TIMES DAILY WITH MEALS    . pantoprazole (PROTONIX) 40 MG tablet TAKE 1 TABLET BY MOUTH TWICE A DAY 180 tablet 1  . traMADol (ULTRAM) 50 MG tablet Take 1 tablet (50 mg total) by mouth 2 (two) times daily as needed for moderate pain. 60 tablet 1  . valsartan-hydrochlorothiazide (DIOVAN-HCT) 160-12.5 MG tablet TAKE 1 TABLET BY MOUTH DAILY. 90 tablet 1  . Calcium Citrate-Vitamin D (CALCIUM + D PO) Take 1 tablet by mouth daily.    . Insulin Aspart Prot & Aspart (NOVOLOG MIX 70/30 [place]) Inject 11 Units into the skin 3 (three) times daily with meals.     No current facility-administered medications for this visit.     Past Medical History:  Diagnosis Date  . Allergy   . Arthritis   . Carpal tunnel syndrome, bilateral   . Diabetes mellitus without complication ([place])   . Diverticulosis   . 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 Gastroparesis   . GERD (gastroesophageal reflux disease)   . Hypertension   .  Hypothyroidism   . PUD (peptic ulcer disease)   . Skin cancer   . Spinal stenosis    lumbar laminectomy    Past Surgical History:  Procedure Laterality Date  . ABDOMINAL HYSTERECTOMY    . antrectomy and vagotomy  1994  . arthroscopic left knee  12/05/08  . BACK SURGERY    . carpal tunnel repai  10/10   left   . CATARACT EXTRACTION, BILATERAL    . EYE SURGERY     bilateral cataracts  . 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 LAPAROSCOPIC CHOLECYSTECTOMY  6/01  . LUMBAR LAMINECTOMY  1999  . LUMBAR LAMINECTOMY WITH COFLEX 1 LEVEL N/A 09/15/2015   Procedure: Lumbar one-two Laminectomy with placement of coflex;  Surgeon: Kristeen Miss, MD;  Location: [place];  Service: Neurosurgery;  Laterality: N/A;  . PARTIAL  HYSTERECTOMY  1960   fibroid tumors, ovaries not removed  . tonsillectomy    . TONSILLECTOMY      Social History Social History   Tobacco Use  . Smoking status: Never Smoker  . Smokeless tobacco: Never Used  Substance Use Topics  . Alcohol use: No    Alcohol/week: 0.0 standard drinks  . Drug use: No     Family History Family History  Problem Relation Age of Onset  . Stroke Mother   . Stroke Father   . Hypertension Father   . Diabetes Brother   . Multiple sclerosis Brother   . Breast cancer Daughter 73  . Colon cancer Neg Hx     Allergies  Allergen Reactions  . Penicillins Rash    REVIEW OF SYSTEMS (Negative unless checked)  Constitutional: [] ?Weight loss  [] ?Fever  [] ?Chills Cardiac: [] ?Chest pain   [] ?Chest pressure   [] ?Palpitations   [] ?Shortness of breath when laying flat   [] ?Shortness of breath at rest   [] ?Shortness of breath with exertion. Vascular:  [] ?Pain in legs with walking   [] ?Pain in legs at rest   [] ?Pain in legs when laying flat   [] ?Claudication   [] ?Pain in feet when walking  [] ?Pain in feet at rest  [] ?Pain in feet when laying flat   [] ?History of DVT   [] ?Phlebitis   [] ?Swelling in legs   [] ?Varicose veins   [] ?Non-healing ulcers Pulmonary:   [] ?Uses home oxygen   [] ?Productive cough   [] ?Hemoptysis   [] ?Wheeze  [] ?COPD   [] ?Asthma Neurologic:  [] ?Dizziness  [] ?Blackouts   [] ?Seizures   [] ?History of stroke   [] ?History of TIA  [] ?Aphasia   [] ?Temporary blindness   [] ?Dysphagia   [] ?Weakness or numbness in arms   [] ?Weakness or numbness in legs Musculoskeletal:  [x] ?Arthritis   [] ?Joint swelling   [x] ?Joint pain   [x] ?Low back pain Hematologic:  [] ?Easy bruising  [] ?Easy bleeding   [] ?Hypercoagulable state   [] ?Anemic  [] ?Hepatitis Gastrointestinal:  [] ?Blood in stool   [] ?Vomiting blood  [x] ?Gastroesophageal reflux/heartburn   [] ?Abdominal pain Genitourinary:  [] ?Chronic kidney disease   [] ?Difficult urination  [] ?Frequent urination  [] ?Burning  with urination   [] ?Hematuria Skin:  [] ?Rashes   [] ?Ulcers   [] ?Wounds Psychological:  [] ?History of anxiety   [] ? History of major depression.   Physical Examination  Vitals:   07/24/18 1350 07/24/18 1351  BP: (!) 160/69 (!) 147/77  Pulse: 67   Resp: 16   Weight: 189 lb (85.7 kg)   Height: 5\' 3"  (1.6 m)    Body mass index is 33.48 kg/m. Gen:  WD/WN, NAD.  Appears younger than stated age  Head: [place]/AT, No temporalis wasting. Ear/Nose/Throat: Hearing grossly intact, nares w/o erythema or drainage, trachea midline Eyes: Conjunctiva clear. Sclera non-icteric Neck: Supple.  Soft bilateral carotid bruit  Pulmonary:  Good air movement, equal and clear to auscultation bilaterally.  Cardiac: RRR, No JVD Vascular:  Vessel Right Left  Radial Palpable Palpable               Musculoskeletal: M/S 5/5 throughout.  No deformity or atrophy.  Mild lower extremity edema. Neurologic: CN 2-12 intact. Sensation grossly intact in extremities.  Symmetrical.  Speech is fluent. Motor exam as listed above. Psychiatric: Judgment intact, Mood & affect appropriate for pt's clinical situation. Dermatologic: No rashes or ulcers noted.  No cellulitis or open wounds.      CBC Lab Results  Component Value Date   WBC 6.5 06/01/2018   HGB 12.4 06/01/2018   HCT 35.4 (L) 06/01/2018   MCV 91.3 06/01/2018   PLT 230.0 06/01/2018    BMET    Component Value Date/Time   NA 140 06/01/2018 0924   K 3.9 06/01/2018 0924   CL 105 06/01/2018 0924   CO2 23 06/01/2018 0924   GLUCOSE 251 (H) 06/01/2018 0924   BUN 21 06/01/2018 0924   CREATININE 0.78 06/01/2018 0924   CREATININE 0.94 03/26/2012 0920   CALCIUM 9.5 06/01/2018 0924   GFRNONAA >60 09/09/2015 1422   GFRNONAA 58 (L) 03/26/2012 0920   GFRAA >60 09/09/2015 1422   GFRAA >60 03/26/2012 0920   CrCl cannot be calculated (Patient's most recent lab result is older than the maximum 21 days allowed.).  COAG No results found for: INR, PROTIME   Radiology No results found.   Assessment/Plan Hypercholesterolemia lipid control important in reducing the progression of atherosclerotic disease. Continue statin therapy   Type 2 diabetes mellitus, uncontrolled blood glucose control important in reducing the progression of atherosclerotic disease. Also, involved in wound healing. On appropriate medications.   Hypertension blood pressure control important in reducing the progression of atherosclerotic disease. On appropriate oral medications.  Carotid stenosis Carotid duplex today actually show better velocities than at her last visit which now follow in the 40 to 59% range bilaterally.  Previously, she was in the 60 to 79% range bilaterally. Continue current medical regimen.  No role for intervention at this level.  I will do another 1-month checkup given the moderate findings previously and the higher end of the 40 to 59% range today.    Leotis Pain, MD  07/24/2018 2:34 PM    This note was created with Dragon medical transcription system.  Any errors from dictation are purely unintentional

## 2018-07-27 ENCOUNTER — Ambulatory Visit: Payer: PPO | Admitting: Urology

## 2018-07-31 DIAGNOSIS — J301 Allergic rhinitis due to pollen: Secondary | ICD-10-CM | POA: Diagnosis not present

## 2018-08-07 DIAGNOSIS — J301 Allergic rhinitis due to pollen: Secondary | ICD-10-CM | POA: Diagnosis not present

## 2018-08-14 DIAGNOSIS — J301 Allergic rhinitis due to pollen: Secondary | ICD-10-CM | POA: Diagnosis not present

## 2018-08-28 DIAGNOSIS — J301 Allergic rhinitis due to pollen: Secondary | ICD-10-CM | POA: Diagnosis not present

## 2018-09-04 DIAGNOSIS — J301 Allergic rhinitis due to pollen: Secondary | ICD-10-CM | POA: Diagnosis not present

## 2018-09-11 DIAGNOSIS — J301 Allergic rhinitis due to pollen: Secondary | ICD-10-CM | POA: Diagnosis not present

## 2018-09-13 DIAGNOSIS — K219 Gastro-esophageal reflux disease without esophagitis: Secondary | ICD-10-CM | POA: Diagnosis not present

## 2018-09-13 DIAGNOSIS — H60549 Acute eczematoid otitis externa, unspecified ear: Secondary | ICD-10-CM | POA: Diagnosis not present

## 2018-09-13 DIAGNOSIS — J301 Allergic rhinitis due to pollen: Secondary | ICD-10-CM | POA: Diagnosis not present

## 2018-09-17 DIAGNOSIS — H353132 Nonexudative age-related macular degeneration, bilateral, intermediate dry stage: Secondary | ICD-10-CM | POA: Diagnosis not present

## 2018-09-17 LAB — HM DIABETES EYE EXAM

## 2018-09-18 DIAGNOSIS — J301 Allergic rhinitis due to pollen: Secondary | ICD-10-CM | POA: Diagnosis not present

## 2018-09-25 DIAGNOSIS — J301 Allergic rhinitis due to pollen: Secondary | ICD-10-CM | POA: Diagnosis not present

## 2018-09-29 ENCOUNTER — Encounter: Payer: Self-pay | Admitting: Internal Medicine

## 2018-10-01 DIAGNOSIS — H3530 Unspecified macular degeneration: Secondary | ICD-10-CM | POA: Insufficient documentation

## 2018-10-02 DIAGNOSIS — J301 Allergic rhinitis due to pollen: Secondary | ICD-10-CM | POA: Diagnosis not present

## 2018-10-08 DIAGNOSIS — M65331 Trigger finger, right middle finger: Secondary | ICD-10-CM | POA: Diagnosis not present

## 2018-10-08 DIAGNOSIS — M25541 Pain in joints of right hand: Secondary | ICD-10-CM | POA: Diagnosis not present

## 2018-10-09 DIAGNOSIS — J301 Allergic rhinitis due to pollen: Secondary | ICD-10-CM | POA: Diagnosis not present

## 2018-10-12 DIAGNOSIS — E039 Hypothyroidism, unspecified: Secondary | ICD-10-CM | POA: Diagnosis not present

## 2018-10-12 DIAGNOSIS — E785 Hyperlipidemia, unspecified: Secondary | ICD-10-CM | POA: Diagnosis not present

## 2018-10-12 DIAGNOSIS — I10 Essential (primary) hypertension: Secondary | ICD-10-CM | POA: Diagnosis not present

## 2018-10-12 DIAGNOSIS — E1069 Type 1 diabetes mellitus with other specified complication: Secondary | ICD-10-CM | POA: Diagnosis not present

## 2018-10-12 DIAGNOSIS — E1159 Type 2 diabetes mellitus with other circulatory complications: Secondary | ICD-10-CM | POA: Diagnosis not present

## 2018-10-12 DIAGNOSIS — R809 Proteinuria, unspecified: Secondary | ICD-10-CM | POA: Diagnosis not present

## 2018-10-12 DIAGNOSIS — E1029 Type 1 diabetes mellitus with other diabetic kidney complication: Secondary | ICD-10-CM | POA: Diagnosis not present

## 2018-10-16 DIAGNOSIS — J301 Allergic rhinitis due to pollen: Secondary | ICD-10-CM | POA: Diagnosis not present

## 2018-10-23 DIAGNOSIS — J301 Allergic rhinitis due to pollen: Secondary | ICD-10-CM | POA: Diagnosis not present

## 2018-10-30 DIAGNOSIS — J301 Allergic rhinitis due to pollen: Secondary | ICD-10-CM | POA: Diagnosis not present

## 2018-10-31 DIAGNOSIS — J301 Allergic rhinitis due to pollen: Secondary | ICD-10-CM | POA: Diagnosis not present

## 2018-11-06 DIAGNOSIS — J301 Allergic rhinitis due to pollen: Secondary | ICD-10-CM | POA: Diagnosis not present

## 2018-11-13 DIAGNOSIS — J301 Allergic rhinitis due to pollen: Secondary | ICD-10-CM | POA: Diagnosis not present

## 2018-11-20 DIAGNOSIS — J301 Allergic rhinitis due to pollen: Secondary | ICD-10-CM | POA: Diagnosis not present

## 2018-11-27 DIAGNOSIS — J301 Allergic rhinitis due to pollen: Secondary | ICD-10-CM | POA: Diagnosis not present

## 2018-12-04 DIAGNOSIS — J301 Allergic rhinitis due to pollen: Secondary | ICD-10-CM | POA: Diagnosis not present

## 2018-12-11 DIAGNOSIS — J301 Allergic rhinitis due to pollen: Secondary | ICD-10-CM | POA: Diagnosis not present

## 2018-12-17 DIAGNOSIS — J301 Allergic rhinitis due to pollen: Secondary | ICD-10-CM | POA: Diagnosis not present

## 2018-12-18 ENCOUNTER — Other Ambulatory Visit: Payer: Self-pay | Admitting: Internal Medicine

## 2018-12-19 ENCOUNTER — Other Ambulatory Visit: Payer: Self-pay | Admitting: Internal Medicine

## 2018-12-19 DIAGNOSIS — M5416 Radiculopathy, lumbar region: Secondary | ICD-10-CM | POA: Diagnosis not present

## 2018-12-19 DIAGNOSIS — M5136 Other intervertebral disc degeneration, lumbar region: Secondary | ICD-10-CM | POA: Diagnosis not present

## 2018-12-19 DIAGNOSIS — M1612 Unilateral primary osteoarthritis, left hip: Secondary | ICD-10-CM | POA: Diagnosis not present

## 2018-12-21 ENCOUNTER — Other Ambulatory Visit: Payer: Self-pay | Admitting: Internal Medicine

## 2018-12-24 DIAGNOSIS — J301 Allergic rhinitis due to pollen: Secondary | ICD-10-CM | POA: Diagnosis not present

## 2018-12-25 ENCOUNTER — Other Ambulatory Visit: Payer: Self-pay | Admitting: Internal Medicine

## 2018-12-31 DIAGNOSIS — J301 Allergic rhinitis due to pollen: Secondary | ICD-10-CM | POA: Diagnosis not present

## 2019-01-03 ENCOUNTER — Other Ambulatory Visit: Payer: Self-pay | Admitting: Internal Medicine

## 2019-01-07 DIAGNOSIS — J301 Allergic rhinitis due to pollen: Secondary | ICD-10-CM | POA: Diagnosis not present

## 2019-01-14 DIAGNOSIS — J301 Allergic rhinitis due to pollen: Secondary | ICD-10-CM | POA: Diagnosis not present

## 2019-01-16 DIAGNOSIS — J301 Allergic rhinitis due to pollen: Secondary | ICD-10-CM | POA: Diagnosis not present

## 2019-01-21 ENCOUNTER — Ambulatory Visit (INDEPENDENT_AMBULATORY_CARE_PROVIDER_SITE_OTHER): Payer: PPO

## 2019-01-21 ENCOUNTER — Other Ambulatory Visit: Payer: Self-pay

## 2019-01-21 ENCOUNTER — Ambulatory Visit (INDEPENDENT_AMBULATORY_CARE_PROVIDER_SITE_OTHER): Payer: PPO | Admitting: Internal Medicine

## 2019-01-21 VITALS — Ht 63.5 in | Wt 189.0 lb

## 2019-01-21 VITALS — Ht 63.0 in | Wt 189.0 lb

## 2019-01-21 DIAGNOSIS — Z0000 Encounter for general adult medical examination without abnormal findings: Secondary | ICD-10-CM | POA: Diagnosis not present

## 2019-01-21 DIAGNOSIS — D649 Anemia, unspecified: Secondary | ICD-10-CM | POA: Diagnosis not present

## 2019-01-21 DIAGNOSIS — E038 Other specified hypothyroidism: Secondary | ICD-10-CM | POA: Diagnosis not present

## 2019-01-21 DIAGNOSIS — I10 Essential (primary) hypertension: Secondary | ICD-10-CM

## 2019-01-21 DIAGNOSIS — N182 Chronic kidney disease, stage 2 (mild): Secondary | ICD-10-CM

## 2019-01-21 DIAGNOSIS — Z9109 Other allergy status, other than to drugs and biological substances: Secondary | ICD-10-CM | POA: Diagnosis not present

## 2019-01-21 DIAGNOSIS — M4800 Spinal stenosis, site unspecified: Secondary | ICD-10-CM

## 2019-01-21 DIAGNOSIS — E7800 Pure hypercholesterolemia, unspecified: Secondary | ICD-10-CM | POA: Diagnosis not present

## 2019-01-21 DIAGNOSIS — E1122 Type 2 diabetes mellitus with diabetic chronic kidney disease: Secondary | ICD-10-CM

## 2019-01-21 DIAGNOSIS — K219 Gastro-esophageal reflux disease without esophagitis: Secondary | ICD-10-CM | POA: Diagnosis not present

## 2019-01-21 DIAGNOSIS — I779 Disorder of arteries and arterioles, unspecified: Secondary | ICD-10-CM

## 2019-01-21 DIAGNOSIS — E1165 Type 2 diabetes mellitus with hyperglycemia: Secondary | ICD-10-CM

## 2019-01-21 NOTE — Progress Notes (Addendum)
Subjective:   Marissa Huff is a 86 y.o. female who presents for Medicare Annual (Subsequent) preventive examination.  Review of Systems:  No ROS.  Medicare Wellness Virtual Visit.  Visual/audio telehealth visit, UTA vital signs. Wt/Ht provided.    See social history for additional risk factors.   Cardiac Risk Factors include: advanced age (>32men, >8 women);diabetes mellitus;hypertension     Objective:     Vitals: Ht 5\' 3"  (1.6 m)   Wt 189 lb (85.7 kg)   BMI 33.48 kg/m   Body mass index is 33.48 kg/m.  Advanced Directives 01/21/2019 01/19/2018 01/18/2017 12/09/2015 09/09/2015 12/09/2014  Does Patient Have a Medical Advance Directive? No No No No No No  Does patient want to make changes to medical advance directive? - No - Patient declined - - - -  Would patient like information on creating a medical advance directive? No - Patient declined - No - Patient declined No - Patient declined No - patient declined information No - patient declined information    Tobacco Social History   Tobacco Use  Smoking Status Never Smoker  Smokeless Tobacco Never Used     Counseling given: Not Answered   Clinical Intake:  Pre-visit preparation completed: Yes        Diabetes: Yes(Folowed by Dr. Honor Junes)  How often do you need to have someone help you when you read instructions, pamphlets, or other written materials from your doctor or pharmacy?: 1 - Never  Interpreter Needed?: No     Past Medical History:  Diagnosis Date  . Allergy   . Arthritis   . Carpal tunnel syndrome, bilateral   . Diabetes mellitus without complication ([place])   . Diverticulosis   . 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 Gastroparesis   . GERD (gastroesophageal reflux disease)   . Hypertension   . Hypothyroidism   . PUD (peptic ulcer disease)   . Skin cancer   . Spinal stenosis    lumbar laminectomy   Past Surgical History:  Procedure Laterality Date  . ABDOMINAL HYSTERECTOMY    . antrectomy and  vagotomy  1994  . arthroscopic left knee  12/05/08  . BACK SURGERY    . carpal tunnel repai  10/10   left   . CATARACT EXTRACTION, BILATERAL    . EYE SURGERY     bilateral cataracts  . 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 LAPAROSCOPIC CHOLECYSTECTOMY  6/01  . LUMBAR LAMINECTOMY  1999  . LUMBAR LAMINECTOMY WITH COFLEX 1 LEVEL N/A 09/15/2015   Procedure: Lumbar one-two Laminectomy with placement of coflex;  Surgeon: Kristeen Miss, MD;  Location: [facility];  Service: Neurosurgery;  Laterality: N/A;  . PARTIAL HYSTERECTOMY  1960   fibroid tumors, ovaries not removed  . tonsillectomy    . TONSILLECTOMY     Family History  Problem Relation Age of Onset  . Stroke Mother   . Stroke Father   . Hypertension Father   . Diabetes Brother   . Multiple sclerosis Brother   . Breast cancer Daughter 54  . Colon cancer Neg Hx    Social History   Socioeconomic History  . Marital status: Married    Spouse name: Not on file  . Number of children: Not on file  . Years of education: Not on file  . Highest education level: Not on file  Occupational History  . Not on file  Tobacco Use  . Smoking status: Never Smoker  . Smokeless tobacco: Never Used  Substance and Sexual Activity  . Alcohol use: No    Alcohol/week: 0.0 standard drinks  . Drug use: No  . Sexual activity: Not Currently    Birth control/protection: None, Post-menopausal  Other Topics Concern  . Not on file  Social History Narrative  . Not on file   Social Determinants of Health   Financial Resource Strain:   . Difficulty of Paying Living Expenses: Not on file  Food Insecurity:   . Worried About Charity fundraiser in the Last Year: Not on file  . Ran Out of Food in the Last Year: Not on file  Transportation Needs:   . Lack of Transportation (Medical): Not on file  . Lack of Transportation (Non-Medical): Not on file  Physical Activity:   . Days of Exercise per Week: Not on file  . Minutes of Exercise per Session: Not  on file  Stress:   . Feeling of Stress : Not on file  Social Connections:   . Frequency of Communication with Friends and Family: Not on file  . Frequency of Social Gatherings with Friends and Family: Not on file  . Attends Religious Services: Not on file  . Active Member of Clubs or Organizations: Not on file  . Attends Archivist Meetings: Not on file  . Marital Status: Not on file    Outpatient Encounter Medications as of 01/21/2019  Medication Sig  . albuterol (PROVENTIL HFA;VENTOLIN HFA) 108 (90 BASE) MCG/ACT inhaler Inhale 2 puffs into the lungs every 6 (six) hours as needed for wheezing or shortness of breath.  Marland Kitchen amLODipine (NORVASC) 5 MG tablet TAKE 1 TABLET BY MOUTH EVERY DAY  . aspirin 81 MG tablet Take 81 mg by mouth daily.  . BD PEN NEEDLE NANO U/F 32G X 4 MM MISC USE 1 EACH 4 (FOUR) TIMES DAILY  . Calcium Citrate-Vitamin D (CALCIUM + D PO) Take 1 tablet by mouth daily.  . Continuous Blood Gluc Receiver (FREESTYLE LIBRE 14 DAY READER) DEVI Use 1 each as needed .  Use to test blood sugar  . Continuous Blood Gluc Sensor (FREESTYLE LIBRE 14 DAY SENSOR) MISC Use 1 each every 14 (fourteen) days  . fexofenadine (ALLEGRA) 180 MG tablet Take 180 mg by mouth daily.  . fluticasone (FLONASE) 50 MCG/ACT nasal spray SPRAY 2 SPRAYS INTO EACH NOSTRIL EVERY DAY  . glucagon (GLUCAGON EMERGENCY) 1 MG injection Inject 1 mg into the muscle once as needed.  Marland Kitchen glucose blood test strip   . Insulin Aspart Prot & Aspart (NOVOLOG MIX 70/30 [place]) Inject 11 Units into the skin 3 (three) times daily with meals.  Marland Kitchen LANTUS SOLOSTAR 100 UNIT/ML Solostar Pen INJECT 24UNITS AT BEDTIME  . levothyroxine (SYNTHROID) 150 MCG tablet TAKE 1 TABLET BY MOUTH EVERY DAY  . LORazepam (ATIVAN) 0.5 MG tablet Take 0.5-1 tablets (0.25-0.5 mg total) by mouth daily as needed for anxiety.  . metFORMIN (GLUCOPHAGE-XR) 500 MG 24 hr tablet TAKE 1 TABLET BY MOUTH TWICE A DAY  . Multiple Vitamins-Minerals (MULTIVITAMIN PO)  Take 1 tablet by mouth daily.   . Multiple Vitamins-Minerals (PRESERVISION AREDS PO) Take 1 capsule by mouth 2 (two) times daily.  Marland Kitchen NOVOLOG FLEXPEN 100 UNIT/ML FlexPen INJECT 10 UNITS SUBCUTANEOUSLY 3 (THREE) TIMES DAILY WITH MEALS  . pantoprazole (PROTONIX) 40 MG tablet TAKE 1 TABLET BY MOUTH TWICE A DAY  . traMADol (ULTRAM) 50 MG tablet Take 1 tablet (50 mg total) by mouth 2 (two) times daily as needed for moderate pain.  Marland Kitchen  valsartan-hydrochlorothiazide (DIOVAN-HCT) 160-12.5 MG tablet TAKE 1 TABLET BY MOUTH EVERY DAY   No facility-administered encounter medications on file as of 01/21/2019.    Activities of Daily Living In your present state of health, do you have any difficulty performing the following activities: 01/21/2019  Hearing? Y  Comment Hearing aids  Vision? N  Difficulty concentrating or making decisions? N  Walking or climbing stairs? N  Dressing or bathing? N  Doing errands, shopping? N  Preparing Food and eating ? N  Using the Toilet? N  In the past six months, have you accidently leaked urine? Y  Comment Managed with daily /padbrief. Followed by urology.  Do you have problems with loss of bowel control? N  Managing your Medications? N  Managing your Finances? N  Housekeeping or managing your Housekeeping? Y  Some recent data might be hidden    Patient Care Team: Einar Pheasant, MD as PCP - General (Internal Medicine)    Assessment:   This is a routine wellness examination for Sabian.  Nurse connected with patient 01/21/19 at 10:30 AM EST by a telephone enabled telemedicine application and verified that I am speaking with the correct person using two identifiers. Patient stated full name and DOB. Patient gave permission to continue with virtual visit. Patient's location was at home and Nurse's location was at [place] office.   Patient is alert and oriented x3. Patient denies difficulty focusing or concentrating.  Health Maintenance Due: -Tdap vaccine-  discussed; to be completed with doctor in visit or local pharmacy.   -Foot Exam- denies wounds, numbness, tingling.  -Hgb A1c- 06/01/18 (7.7) See completed HM at the end of note.  -Diabetes- followed by Dr. Honor Junes.  Eye: Visual acuity not assessed. Virtual visit. Followed by their ophthalmologist. Retinopathy- none reported. Macular degeneration. Visits every 6 months.   Dental: UTD.  Hearing: Hearing aids- yes  Safety:  Patient feels safe at home- yes Patient does have smoke detectors at home- yes Patient does wear sunscreen or protective clothing when in direct sunlight - yes Patient does wear seat belt when in a moving vehicle - yes Patient drives- yes Adequate lighting in walkways free from debris- yes Grab bars and handrails used as appropriate- yes Ambulates with an assistive device- no Cell phone on person when ambulating outside of the home- yes  Social: Alcohol intake - no  Smoking history- never  Smokers in home? none Illicit drug use? none  Medication: Taking as directed and without issues.  Pill box in use -yes  Self managed - yes   Covid-19: Precautions and sickness symptoms discussed. Wears mask, social distancing, hand hygiene as appropriate.   Activities of Daily Living Patient denies needing assistance with: feeding themselves, getting from bed to chair, getting to the toilet, bathing/showering, dressing, managing money, or preparing meals.  Assisted by youngest daughter with household chores as needed.   Discussed the importance of a healthy diet, water intake and the benefits of aerobic exercise.   Physical activity- active at home, no routine. Encouraged to stay healthy.   Diet:  Carb modified Water: fair intake  Other Providers Patient Care Team: Einar Pheasant, MD as PCP - General (Internal Medicine)  Exercise Activities and Dietary recommendations Current Exercise Habits: Home exercise routine, Type of exercise: walking, Intensity:  Mild  Goals    . Follow up with Primary Care Provider     As needed       Fall Risk Fall Risk  01/21/2019 01/19/2018 01/18/2017 06/07/2016 12/09/2015  Falls  in the past year? 0 0 No No No  Number falls in past yr: - - - - -  Injury with Fall? - - - - -  Comment - - - - -  Risk Factor Category  - - - - -  Follow up Falls evaluation completed - - - -   Timed Get Up and Go performed: no, virtual visit  Depression Screen PHQ 2/9 Scores 01/21/2019 01/19/2018 01/18/2017 06/07/2016  PHQ - 2 Score 0 0 0 0  PHQ- 9 Score - - 0 -     Cognitive Function MMSE - Mini Mental State Exam 01/18/2017 12/09/2015 12/09/2014  Orientation to time 5 5 5   Orientation to Place 5 5 5   Registration 3 3 3   Attention/ Calculation 4 5 5   Recall 3 3 3   Language- name 2 objects 2 2 2   Language- repeat 1 1 1   Language- follow 3 step command 3 3 3   Language- read & follow direction 1 1 1   Write a sentence 1 1 1   Copy design 1 1 1   Total score 29 30 30      6CIT Screen 01/21/2019 01/19/2018  What Year? 0 points 0 points  What month? 0 points 0 points  What time? 0 points 0 points  Count back from 20 0 points 0 points  Months in reverse 0 points 0 points  Repeat phrase - 0 points  Total Score - 0    Immunization History  Administered Date(s) Administered  . Influenza Split 12/12/2011, 10/01/2012  . Influenza, High Dose Seasonal PF 10/02/2015, 09/08/2016, 09/19/2017, 09/26/2018  . Influenza,inj,Quad PF,6+ Mos 09/03/2013, 10/27/2014  . Pneumococcal Conjugate-13 11/11/2014  . Pneumococcal Polysaccharide-23 12/09/2015   Screening Tests Health Maintenance  Topic Date Due  . FOOT EXAM  05/06/2018  . HEMOGLOBIN A1C  12/02/2018  . TETANUS/TDAP  01/21/2020 (Originally 03/09/1950)  . MAMMOGRAM  02/27/2019  . OPHTHALMOLOGY EXAM  09/17/2019  . INFLUENZA VACCINE  Completed  . DEXA SCAN  Completed  . PNA vac Low Risk Adult  Completed      Plan:   Keep all routine maintenance appointments.   Follow up today @  2:00  Medicare Attestation I have personally reviewed: The patient's medical and social history Their use of alcohol, tobacco or illicit drugs Their current medications and supplements The patient's functional ability including ADLs,fall risks, home safety risks, cognitive, and hearing and visual impairment Diet and physical activities Evidence for depression  I have reviewed and discussed with patient certain preventive protocols, quality metrics, and best practice recommendations.     Varney Biles, LPN  [id number]   Reviewed above information.  Agree with assessment and plan.    Dr Nicki Reaper

## 2019-01-21 NOTE — Patient Instructions (Addendum)
  Marissa Huff , Thank you for taking time to come for your Medicare Wellness Visit. I appreciate your ongoing commitment to your health goals. Please review the following plan we discussed and let me know if I can assist you in the future.   These are the goals we discussed: Goals    . Follow up with Primary Care Provider     As needed       This is a list of the screening recommended for you and due dates:  Health Maintenance  Topic Date Due  . Complete foot exam   05/06/2018  . Hemoglobin A1C  12/02/2018  . Tetanus Vaccine  01/21/2020*  . Mammogram  02/27/2019  . Eye exam for diabetics  09/17/2019  . Flu Shot  Completed  . DEXA scan (bone density measurement)  Completed  . Pneumonia vaccines  Completed  *Topic was postponed. The date shown is not the original due date.

## 2019-01-21 NOTE — Progress Notes (Signed)
Patient ID: Marissa Huff, female   DOB: 04-13-1931, 86 y.o.   MRN: [id number]   Virtual Visit via telephone Note  This visit type was conducted due to national recommendations for restrictions regarding the COVID-19 pandemic (e.g. social distancing).  This format is felt to be most appropriate for this patient at this time.  All issues noted in this document were discussed and addressed.  No physical exam was performed (except for noted visual exam findings with Video Visits).   I connected with Marissa Huff by telephone and verified that I am speaking with the correct person using two identifiers. Location patient: home Location provider: work  Persons participating in the telephone visit: patient, provider  The limitations, risks, security and privacy concerns of performing an evaluation and management service by telephone and the availability of in person appointments have been discussed. The patient expressed understanding and agreed to proceed.   Reason for visit: scheduled follow up.    HPI: She reports she is doing relatively well.  Still with back pain.  Hurts worse with walking or standing.  Is followed by Dr Sharlet Salina office.  Takes tramadol.  Trying to stay active.  No chest pain.  No increased sob reported.  Sees Dr Kathyrn Sheriff for allergies/allergic rhinitis. Gets allergy injection q week.   Noticed recently some mucus in her throat.  Started mucinex.  Discussed using flonase q night.  Symptoms controlled on this regimen.  No chest congestion.  No acid reflux reported.  No abdominal pain.  Some variation in her bowel movements.  Discussed taking metamucil to help regulate.  Sees Dr Honor Junes for her sugars.  Last a1c 6.9.  Previously had episode of low sugar at night.  Not a significant issue for her now.  Discussed eating protein snack prior to bed.  Saw Dr Lucky Cowboy 07/24/18 - stable.  Has f/u this week.  Overall she feels things are stable.    ROS: See pertinent positives and negatives per  HPI.  Past Medical History:  Diagnosis Date  . Allergy   . Arthritis   . Carpal tunnel syndrome, bilateral   . Diabetes mellitus without complication ([place])   . Diverticulosis   . 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 Gastroparesis   . GERD (gastroesophageal reflux disease)   . Hypertension   . Hypothyroidism   . PUD (peptic ulcer disease)   . Skin cancer   . Spinal stenosis    lumbar laminectomy    Past Surgical History:  Procedure Laterality Date  . ABDOMINAL HYSTERECTOMY    . antrectomy and vagotomy  1994  . arthroscopic left knee  12/05/08  . BACK SURGERY    . carpal tunnel repai  10/10   left   . CATARACT EXTRACTION, BILATERAL    . EYE SURGERY     bilateral cataracts  . 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 LAPAROSCOPIC CHOLECYSTECTOMY  6/01  . LUMBAR LAMINECTOMY  1999  . LUMBAR LAMINECTOMY WITH COFLEX 1 LEVEL N/A 09/15/2015   Procedure: Lumbar one-two Laminectomy with placement of coflex;  Surgeon: Kristeen Miss, MD;  Location: [facility];  Service: Neurosurgery;  Laterality: N/A;  . PARTIAL HYSTERECTOMY  1960   fibroid tumors, ovaries not removed  . tonsillectomy    . TONSILLECTOMY      Family History  Problem Relation Age of Onset  . Stroke Mother   . Stroke Father   . Hypertension Father   . Diabetes Brother   . Multiple sclerosis Brother   .  Breast cancer Daughter 28  . Colon cancer Neg Hx     SOCIAL HX: reviewed.    Current Outpatient Medications:  .  albuterol (PROVENTIL HFA;VENTOLIN HFA) 108 (90 BASE) MCG/ACT inhaler, Inhale 2 puffs into the lungs every 6 (six) hours as needed for wheezing or shortness of breath., Disp: 1 Inhaler, Rfl: 1 .  amLODipine (NORVASC) 5 MG tablet, TAKE 1 TABLET BY MOUTH EVERY DAY, Disp: 90 tablet, Rfl: 1 .  aspirin 81 MG tablet, Take 81 mg by mouth daily., Disp: , Rfl:  .  BD PEN NEEDLE NANO U/F 32G X 4 MM MISC, USE 1 EACH 4 (FOUR) TIMES DAILY, Disp: , Rfl:  .  Calcium Citrate-Vitamin D (CALCIUM + D PO), Take 1 tablet by mouth  daily., Disp: , Rfl:  .  Continuous Blood Gluc Receiver (FREESTYLE LIBRE 14 DAY READER) DEVI, Use 1 each as needed .  Use to test blood sugar, Disp: , Rfl:  .  Continuous Blood Gluc Sensor (FREESTYLE LIBRE 14 DAY SENSOR) MISC, Use 1 each every 14 (fourteen) days, Disp: , Rfl:  .  fexofenadine (ALLEGRA) 180 MG tablet, Take 180 mg by mouth daily., Disp: , Rfl:  .  fluticasone (FLONASE) 50 MCG/ACT nasal spray, SPRAY 2 SPRAYS INTO EACH NOSTRIL EVERY DAY, Disp: 48 mL, Rfl: 3 .  glucagon (GLUCAGON EMERGENCY) 1 MG injection, Inject 1 mg into the muscle once as needed., Disp: 1 each, Rfl: 3 .  glucose blood test strip, , Disp: , Rfl:  .  Insulin Aspart Prot & Aspart (NOVOLOG MIX 70/30 [place]), Inject 11 Units into the skin 3 (three) times daily with meals., Disp: , Rfl:  .  LANTUS SOLOSTAR 100 UNIT/ML Solostar Pen, INJECT 24UNITS AT BEDTIME, Disp: , Rfl: 4 .  levothyroxine (SYNTHROID) 150 MCG tablet, TAKE 1 TABLET BY MOUTH EVERY DAY, Disp: 90 tablet, Rfl: 3 .  LORazepam (ATIVAN) 0.5 MG tablet, Take 0.5-1 tablets (0.25-0.5 mg total) by mouth daily as needed for anxiety., Disp: 30 tablet, Rfl: 0 .  metFORMIN (GLUCOPHAGE-XR) 500 MG 24 hr tablet, TAKE 1 TABLET BY MOUTH TWICE A DAY, Disp: 180 tablet, Rfl: 1 .  Multiple Vitamins-Minerals (MULTIVITAMIN PO), Take 1 tablet by mouth daily. , Disp: , Rfl:  .  Multiple Vitamins-Minerals (PRESERVISION AREDS PO), Take 1 capsule by mouth 2 (two) times daily., Disp: , Rfl:  .  NOVOLOG FLEXPEN 100 UNIT/ML FlexPen, INJECT 10 UNITS SUBCUTANEOUSLY 3 (THREE) TIMES DAILY WITH MEALS, Disp: , Rfl:  .  pantoprazole (PROTONIX) 40 MG tablet, TAKE 1 TABLET BY MOUTH TWICE A DAY, Disp: 180 tablet, Rfl: 1 .  traMADol (ULTRAM) 50 MG tablet, Take 1 tablet (50 mg total) by mouth 2 (two) times daily as needed for moderate pain., Disp: 60 tablet, Rfl: 1 .  valsartan-hydrochlorothiazide (DIOVAN-HCT) 160-12.5 MG tablet, TAKE 1 TABLET BY MOUTH EVERY DAY, Disp: 90 tablet, Rfl: 1  EXAM:  GENERAL:  alert.  Sounds to be in no acute distress.  Answering questions appropriately.   S: moves all visible extremities without noticeable abnormality  PSYCH/NEURO: pleasant and cooperative, no obvious depression or anxiety, speech and thought processing grossly intact  ASSESSMENT AND PLAN:  Discussed the following assessment and plan:  Anemia Hemoglobin wnl 05/2018.    Carotid artery disease, unspecified laterality ([place]) Carotid duplex 07/2018 - 40-59% bilaterally.  Continue current medication regimen.  Saw [facility].  Recommended f/u in 6 months.    Environmental allergies Sees Dr Kathyrn Sheriff.  Receives allergy injections.  mucinex and flonase as directed.  Controlled.  Follow.   GERD (gastroesophageal reflux disease) Controlled on protonix.    Hypercholesterolemia Follow labs.  Discuss statin therapy.   Hypertension Blood pressure under good control.  Continue same medication regimen.  Follow pressures.  Follow metabolic panel.    Hypothyroidism On thyroid replacement.  Follow tsh.   Spinal stenosis S/p surgery.  Persistent back pain.  Followed by Dr Sharlet Salina.    Type 2 diabetes mellitus, uncontrolled Low carb diet and exercise.  Follow met b and a1c.     Orders Placed This Encounter  Procedures  . Hepatic function panel    Standing Status:   Future    Standing Expiration Date:   01/21/2020  . Lipid panel    Standing Status:   Future    Standing Expiration Date:   01/21/2020  . Basic metabolic panel    Standing Status:   Future    Standing Expiration Date:   01/21/2020  . Microalbumin / creatinine urine ratio    Standing Status:   Future    Standing Expiration Date:   01/21/2020     I discussed the assessment and treatment plan with the patient. The patient was provided an opportunity to ask questions and all were answered. The patient agreed with the plan and demonstrated an understanding of the instructions.   The patient was advised to call back or seek an in-person evaluation  if the symptoms worsen or if the condition fails to improve as anticipated.  I provided 21 minutes of non-face-to-face time during this encounter.   Einar Pheasant, MD

## 2019-01-24 ENCOUNTER — Encounter: Payer: Self-pay | Admitting: Internal Medicine

## 2019-01-24 NOTE — Assessment & Plan Note (Signed)
Follow labs.  Discuss statin therapy.

## 2019-01-24 NOTE — Assessment & Plan Note (Signed)
Blood pressure under good control.  Continue same medication regimen.  Follow pressures.  Follow metabolic panel.   

## 2019-01-24 NOTE — Assessment & Plan Note (Signed)
On thyroid replacement.  Follow tsh.  

## 2019-01-24 NOTE — Assessment & Plan Note (Signed)
Carotid duplex 07/2018 - 40-59% bilaterally.  Continue current medication regimen.  Saw [facility].  Recommended f/u in 6 months.

## 2019-01-24 NOTE — Assessment & Plan Note (Signed)
Low carb diet and exercise.  Follow met b and a1c.   

## 2019-01-24 NOTE — Assessment & Plan Note (Signed)
S/p surgery.  Persistent back pain.  Followed by Dr Sharlet Salina.

## 2019-01-24 NOTE — Assessment & Plan Note (Signed)
Sees Dr Kathyrn Sheriff.  Receives allergy injections.  mucinex and flonase as directed.  Controlled.  Follow.

## 2019-01-24 NOTE — Assessment & Plan Note (Signed)
Controlled on protonix.   

## 2019-01-24 NOTE — Assessment & Plan Note (Signed)
Hemoglobin wnl 05/2018.

## 2019-01-25 ENCOUNTER — Other Ambulatory Visit: Payer: Self-pay

## 2019-01-25 ENCOUNTER — Ambulatory Visit (INDEPENDENT_AMBULATORY_CARE_PROVIDER_SITE_OTHER): Payer: PPO | Admitting: Nurse Practitioner

## 2019-01-25 ENCOUNTER — Encounter (INDEPENDENT_AMBULATORY_CARE_PROVIDER_SITE_OTHER): Payer: Self-pay | Admitting: Nurse Practitioner

## 2019-01-25 ENCOUNTER — Ambulatory Visit (INDEPENDENT_AMBULATORY_CARE_PROVIDER_SITE_OTHER): Payer: PPO

## 2019-01-25 VITALS — BP 147/69 | HR 68 | Resp 20 | Ht 63.0 in | Wt 185.0 lb

## 2019-01-25 DIAGNOSIS — I6523 Occlusion and stenosis of bilateral carotid arteries: Secondary | ICD-10-CM

## 2019-01-25 DIAGNOSIS — E785 Hyperlipidemia, unspecified: Secondary | ICD-10-CM | POA: Diagnosis not present

## 2019-01-25 DIAGNOSIS — I779 Disorder of arteries and arterioles, unspecified: Secondary | ICD-10-CM

## 2019-01-25 DIAGNOSIS — I10 Essential (primary) hypertension: Secondary | ICD-10-CM | POA: Diagnosis not present

## 2019-01-25 DIAGNOSIS — E1069 Type 1 diabetes mellitus with other specified complication: Secondary | ICD-10-CM

## 2019-01-29 NOTE — Progress Notes (Signed)
SUBJECTIVE:  Patient ID: Marissa Huff, female    DOB: 09/25/1931, 86 y.o.   MRN: [id number] Chief Complaint  Patient presents with  . Follow-up  . Carotid    HPI  Marissa Huff is a 86 y.o. female The patient is seen for follow up evaluation of carotid stenosis. The carotid stenosis followed by ultrasound.   The patient denies amaurosis fugax. There is no recent history of TIA symptoms or focal motor deficits. There is no prior documented CVA.  The patient is taking enteric-coated aspirin 81 mg daily.  There is no history of migraine headaches. There is no history of seizures.  The patient has a history of coronary artery disease, no recent episodes of angina or shortness of breath. The patient denies PAD or claudication symptoms. There is a history of hyperlipidemia which is being treated with a statin.    Carotid Duplex done today shows 40-59% on the right ICA, with 1-39% on the left ICA.  Little change compared to last study in 07/24/2018  Past Medical History:  Diagnosis Date  . Allergy   . Arthritis   . Carpal tunnel syndrome, bilateral   . Diabetes mellitus without complication ([place])   . Diverticulosis   . 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 Gastroparesis   . GERD (gastroesophageal reflux disease)   . Hypertension   . Hypothyroidism   . PUD (peptic ulcer disease)   . Skin cancer   . Spinal stenosis    lumbar laminectomy    Past Surgical History:  Procedure Laterality Date  . ABDOMINAL HYSTERECTOMY    . antrectomy and vagotomy  1994  . arthroscopic left knee  12/05/08  . BACK SURGERY    . carpal tunnel repai  10/10   left   . CATARACT EXTRACTION, BILATERAL    . EYE SURGERY     bilateral cataracts  . 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 LAPAROSCOPIC CHOLECYSTECTOMY  6/01  . LUMBAR LAMINECTOMY  1999  . LUMBAR LAMINECTOMY WITH COFLEX 1 LEVEL N/A 09/15/2015   Procedure: Lumbar one-two Laminectomy with placement of coflex;  Surgeon: Kristeen Miss, MD;  Location: [facility];  Service: Neurosurgery;  Laterality: N/A;  . PARTIAL HYSTERECTOMY  1960   fibroid tumors, ovaries not removed  . tonsillectomy    . TONSILLECTOMY      Social History   Socioeconomic History  . Marital status: Married    Spouse name: Not on file  . Number of children: Not on file  . Years of education: Not on file  . Highest education level: Not on file  Occupational History  . Not on file  Tobacco Use  . Smoking status: Never Smoker  . Smokeless tobacco: Never Used  Substance and Sexual Activity  . Alcohol use: No    Alcohol/week: 0.0 standard drinks  . Drug use: No  . Sexual activity: Not Currently    Birth control/protection: None, Post-menopausal  Other Topics Concern  . Not on file  Social History Narrative  . Not on file   Social Determinants of Health   Financial Resource Strain:   . Difficulty of Paying Living Expenses: Not on file  Food Insecurity:   . Worried About Charity fundraiser in the Last Year: Not on file  . Ran Out of Food in the Last Year: Not on file  Transportation Needs:   . Lack of Transportation (Medical): Not on file  . Lack of Transportation (Non-Medical): Not on  file  Physical Activity:   . Days of Exercise per Week: Not on file  . Minutes of Exercise per Session: Not on file  Stress:   . Feeling of Stress : Not on file  Social Connections:   . Frequency of Communication with Friends and Family: Not on file  . Frequency of Social Gatherings with Friends and Family: Not on file  . Attends Religious Services: Not on file  . Active Member of Clubs or Organizations: Not on file  . Attends Archivist Meetings: Not on file  . Marital Status: Not on file  Intimate Partner Violence:   . Fear of Current or Ex-Partner: Not on file  . Emotionally Abused: Not on file  . Physically Abused: Not on file  . Sexually Abused: Not on file    Family History  Problem Relation Age of Onset  . Stroke Mother   . Stroke Father     . Hypertension Father   . Diabetes Brother   . Multiple sclerosis Brother   . Breast cancer Daughter 61  . Colon cancer Neg Hx     Allergies  Allergen Reactions  . Penicillins Rash     Review of Systems   Review of Systems: Negative Unless Checked Constitutional: [] Weight loss  [] Fever  [] Chills Cardiac: [] Chest pain   []  Atrial Fibrillation  [] Palpitations   [] Shortness of breath when laying flat   [] Shortness of breath with exertion. [] Shortness of breath at rest Vascular:  [] Pain in legs with walking   [] Pain in legs with standing [] Pain in legs when laying flat   [] Claudication    [] Pain in feet when laying flat    [] History of DVT   [] Phlebitis   [] Swelling in legs   [] Varicose veins   [] Non-healing ulcers Pulmonary:   [] Uses home oxygen   [] Productive cough   [] Hemoptysis   [] Wheeze  [] COPD   [] Asthma Neurologic:  [] Dizziness   [] Seizures  [] Blackouts [] History of stroke   [] History of TIA  [] Aphasia   [] Temporary Blindness   [] Weakness or numbness in arm   [] Weakness or numbness in leg Musculoskeletal:   [] Joint swelling   [] Joint pain   [] Low back pain  []  History of Knee Replacement [] Arthritis [] back Surgeries  []  Spinal Stenosis    Hematologic:  [] Easy bruising  [] Easy bleeding   [] Hypercoagulable state   [x] Anemic Gastrointestinal:  [] Diarrhea   [] Vomiting  [x] Gastroesophageal reflux/heartburn   [] Difficulty swallowing. [] Abdominal pain Genitourinary:  [x] Chronic kidney disease   [] Difficult urination  [] Anuric   [] Blood in urine [] Frequent urination  [] Burning with urination   [] Hematuria Skin:  [] Rashes   [] Ulcers [] Wounds Psychological:  [] History of anxiety   []  History of major depression  []  Memory Difficulties      OBJECTIVE:   Physical Exam  BP (!) 147/69 (BP Location: Right Arm)   Pulse 68   Resp 20   Ht 5\' 3"  (1.6 m)   Wt 185 lb (83.9 kg)   BMI 32.77 kg/m   Gen: WD/WN, NAD Head: [place]/AT, No temporalis wasting.  Ear/Nose/Throat: Hearing grossly intact,  nares w/o erythema or drainage Eyes: PER, EOMI, sclera nonicteric.  Neck: Supple, no masses.  No JVD.  Pulmonary:  Good air movement, no use of accessory muscles.  Cardiac: RRR Gastrointestinal: soft, non-distended. No guarding/no peritoneal signs.  Musculoskeletal: M/S 5/5 throughout.  No deformity or atrophy.  Neurologic: Pain and light touch intact in extremities.  Symmetrical.  Speech is fluent. Motor exam as listed  above. Psychiatric: Judgment intact, Mood & affect appropriate for pt's clinical situation.        ASSESSMENT AND PLAN:  1. Carotid artery disease, unspecified laterality ([place]) Recommend:  Given the patient's asymptomatic subcritical stenosis no further invasive testing or surgery at this time.  Carotid Duplex done today shows 40-59% on the right ICA, with 1-39% on the left ICA.  Little change compared to last study in 07/24/2018  Continue antiplatelet therapy as prescribed Continue management of CAD, HTN and Hyperlipidemia Healthy heart diet,  encouraged exercise at least 4 times per week Follow up in 12 months with duplex ultrasound and physical exam   2. Hyperlipidemia due to type 1 diabetes mellitus ([place]) On appropriate medications, important to atherosclerotic disease progression   3. Essential hypertension Good BP control, important to slowing atherosclerotic disease progression . No med changes   Current Outpatient Medications on File Prior to Visit  Medication Sig Dispense Refill  . albuterol (PROVENTIL HFA;VENTOLIN HFA) 108 (90 BASE) MCG/ACT inhaler Inhale 2 puffs into the lungs every 6 (six) hours as needed for wheezing or shortness of breath. 1 Inhaler 1  . amLODipine (NORVASC) 5 MG tablet TAKE 1 TABLET BY MOUTH EVERY DAY 90 tablet 1  . aspirin 81 MG tablet Take 81 mg by mouth daily.    . BD PEN NEEDLE NANO U/F 32G X 4 MM MISC USE 1 EACH 4 (FOUR) TIMES DAILY    . Calcium Citrate-Vitamin D (CALCIUM + D PO) Take 1 tablet by mouth daily.    .  Continuous Blood Gluc Receiver (FREESTYLE LIBRE 14 DAY READER) DEVI Use 1 each as needed .  Use to test blood sugar    . Continuous Blood Gluc Sensor (FREESTYLE LIBRE 14 DAY SENSOR) MISC Use 1 each every 14 (fourteen) days    . fexofenadine (ALLEGRA) 180 MG tablet Take 180 mg by mouth daily.    . fluticasone (FLONASE) 50 MCG/ACT nasal spray SPRAY 2 SPRAYS INTO EACH NOSTRIL EVERY DAY 48 mL 3  . glucagon (GLUCAGON EMERGENCY) 1 MG injection Inject 1 mg into the muscle once as needed. 1 each 3  . glucose blood test strip     . Insulin Aspart Prot & Aspart (NOVOLOG MIX 70/30 [place]) Inject 11 Units into the skin 3 (three) times daily with meals.    Marland Kitchen LANTUS SOLOSTAR 100 UNIT/ML Solostar Pen INJECT 24UNITS AT BEDTIME  4  . levothyroxine (SYNTHROID) 150 MCG tablet TAKE 1 TABLET BY MOUTH EVERY DAY 90 tablet 3  . LORazepam (ATIVAN) 0.5 MG tablet Take 0.5-1 tablets (0.25-0.5 mg total) by mouth daily as needed for anxiety. 30 tablet 0  . metFORMIN (GLUCOPHAGE-XR) 500 MG 24 hr tablet TAKE 1 TABLET BY MOUTH TWICE A DAY 180 tablet 1  . Multiple Vitamins-Minerals (MULTIVITAMIN PO) Take 1 tablet by mouth daily.     . Multiple Vitamins-Minerals (PRESERVISION AREDS PO) Take 1 capsule by mouth 2 (two) times daily.    Marland Kitchen NOVOLOG FLEXPEN 100 UNIT/ML FlexPen INJECT 10 UNITS SUBCUTANEOUSLY 3 (THREE) TIMES DAILY WITH MEALS    . pantoprazole (PROTONIX) 40 MG tablet TAKE 1 TABLET BY MOUTH TWICE A DAY 180 tablet 1  . traMADol (ULTRAM) 50 MG tablet Take 1 tablet (50 mg total) by mouth 2 (two) times daily as needed for moderate pain. 60 tablet 1  . valsartan-hydrochlorothiazide (DIOVAN-HCT) 160-12.5 MG tablet TAKE 1 TABLET BY MOUTH EVERY DAY 90 tablet 1   No current facility-administered medications on file prior to visit.  There are no Patient Instructions on file for this visit. No follow-ups on file.   Kris Hartmann, NP  This note was completed with Sales executive.  Any errors are purely unintentional.

## 2019-02-04 DIAGNOSIS — J301 Allergic rhinitis due to pollen: Secondary | ICD-10-CM | POA: Diagnosis not present

## 2019-02-21 ENCOUNTER — Other Ambulatory Visit: Payer: PPO

## 2019-02-25 ENCOUNTER — Other Ambulatory Visit: Payer: Self-pay

## 2019-02-25 ENCOUNTER — Other Ambulatory Visit (INDEPENDENT_AMBULATORY_CARE_PROVIDER_SITE_OTHER): Payer: PPO

## 2019-02-25 DIAGNOSIS — E1165 Type 2 diabetes mellitus with hyperglycemia: Secondary | ICD-10-CM | POA: Diagnosis not present

## 2019-02-25 DIAGNOSIS — J301 Allergic rhinitis due to pollen: Secondary | ICD-10-CM | POA: Diagnosis not present

## 2019-02-25 DIAGNOSIS — E7800 Pure hypercholesterolemia, unspecified: Secondary | ICD-10-CM

## 2019-02-25 LAB — MICROALBUMIN / CREATININE URINE RATIO
Creatinine,U: 74.3 mg/dL
Microalb Creat Ratio: 2.6 mg/g (ref 0.0–30.0)
Microalb, Ur: 1.9 mg/dL (ref 0.0–1.9)

## 2019-02-25 LAB — HEPATIC FUNCTION PANEL
ALT: 16 U/L (ref 0–35)
AST: 14 U/L (ref 0–37)
Albumin: 4.2 g/dL (ref 3.5–5.2)
Alkaline Phosphatase: 96 U/L (ref 39–117)
Bilirubin, Direct: 0.1 mg/dL (ref 0.0–0.3)
Total Bilirubin: 0.4 mg/dL (ref 0.2–1.2)
Total Protein: 7 g/dL (ref 6.0–8.3)

## 2019-02-25 LAB — BASIC METABOLIC PANEL
BUN: 20 mg/dL (ref 6–23)
CO2: 28 mEq/L (ref 19–32)
Calcium: 9.8 mg/dL (ref 8.4–10.5)
Chloride: 103 mEq/L (ref 96–112)
Creatinine, Ser: 0.8 mg/dL (ref 0.40–1.20)
GFR: 68.02 mL/min (ref >60.00)
Glucose, Bld: 239 mg/dL — ABNORMAL HIGH (ref 70–99)
Potassium: 4.2 mEq/L (ref 3.5–5.1)
Sodium: 138 mEq/L (ref 135–145)

## 2019-02-25 LAB — LIPID PANEL
Cholesterol: 151 mg/dL (ref 0–200)
HDL: 52.9 mg/dL (ref >39.00)
LDL Cholesterol: 85 mg/dL (ref 0–99)
NonHDL: 97.7
Total CHOL/HDL Ratio: 3
Triglycerides: 62 mg/dL (ref 0.0–149.0)
VLDL: 12.4 mg/dL (ref 0.0–40.0)

## 2019-02-26 ENCOUNTER — Other Ambulatory Visit: Payer: Self-pay

## 2019-02-26 MED ORDER — ROSUVASTATIN CALCIUM 5 MG PO TABS: 5.0000 mg | ORAL_TABLET | ORAL | 1 refills | Status: DC

## 2019-03-04 DIAGNOSIS — J301 Allergic rhinitis due to pollen: Secondary | ICD-10-CM | POA: Diagnosis not present

## 2019-03-11 DIAGNOSIS — J301 Allergic rhinitis due to pollen: Secondary | ICD-10-CM | POA: Diagnosis not present

## 2019-03-18 DIAGNOSIS — J301 Allergic rhinitis due to pollen: Secondary | ICD-10-CM | POA: Diagnosis not present

## 2019-03-19 DIAGNOSIS — H353132 Nonexudative age-related macular degeneration, bilateral, intermediate dry stage: Secondary | ICD-10-CM | POA: Diagnosis not present

## 2019-03-25 DIAGNOSIS — J301 Allergic rhinitis due to pollen: Secondary | ICD-10-CM | POA: Diagnosis not present

## 2019-04-01 DIAGNOSIS — J301 Allergic rhinitis due to pollen: Secondary | ICD-10-CM | POA: Diagnosis not present

## 2019-04-04 ENCOUNTER — Telehealth: Payer: Self-pay | Admitting: *Deleted

## 2019-04-04 DIAGNOSIS — E1165 Type 2 diabetes mellitus with hyperglycemia: Secondary | ICD-10-CM

## 2019-04-04 DIAGNOSIS — I10 Essential (primary) hypertension: Secondary | ICD-10-CM

## 2019-04-04 DIAGNOSIS — E7800 Pure hypercholesterolemia, unspecified: Secondary | ICD-10-CM

## 2019-04-04 NOTE — Telephone Encounter (Signed)
Please place future orders for lab appt.  

## 2019-04-06 NOTE — Telephone Encounter (Signed)
Order placed for f/u labs.  

## 2019-04-08 ENCOUNTER — Other Ambulatory Visit: Payer: Self-pay

## 2019-04-08 ENCOUNTER — Other Ambulatory Visit (INDEPENDENT_AMBULATORY_CARE_PROVIDER_SITE_OTHER): Payer: PPO

## 2019-04-08 ENCOUNTER — Encounter: Payer: Self-pay | Admitting: Internal Medicine

## 2019-04-08 DIAGNOSIS — J301 Allergic rhinitis due to pollen: Secondary | ICD-10-CM | POA: Diagnosis not present

## 2019-04-08 DIAGNOSIS — E7800 Pure hypercholesterolemia, unspecified: Secondary | ICD-10-CM

## 2019-04-08 DIAGNOSIS — I10 Essential (primary) hypertension: Secondary | ICD-10-CM | POA: Diagnosis not present

## 2019-04-08 DIAGNOSIS — E1165 Type 2 diabetes mellitus with hyperglycemia: Secondary | ICD-10-CM | POA: Diagnosis not present

## 2019-04-08 LAB — LIPID PANEL
Cholesterol: 103 mg/dL (ref 0–200)
HDL: 46.3 mg/dL (ref >39.00)
LDL Cholesterol: 37 mg/dL (ref 0–99)
NonHDL: 57.08
Total CHOL/HDL Ratio: 2
Triglycerides: 101 mg/dL (ref 0.0–149.0)
VLDL: 20.2 mg/dL (ref 0.0–40.0)

## 2019-04-08 LAB — HEPATIC FUNCTION PANEL
ALT: 15 U/L (ref 0–35)
AST: 17 U/L (ref 0–37)
Albumin: 3.8 g/dL (ref 3.5–5.2)
Alkaline Phosphatase: 74 U/L (ref 39–117)
Bilirubin, Direct: 0.1 mg/dL (ref 0.0–0.3)
Total Bilirubin: 0.4 mg/dL (ref 0.2–1.2)
Total Protein: 6.2 g/dL (ref 6.0–8.3)

## 2019-04-08 LAB — BASIC METABOLIC PANEL
BUN: 21 mg/dL (ref 6–23)
CO2: 27 mEq/L (ref 19–32)
Calcium: 9.1 mg/dL (ref 8.4–10.5)
Chloride: 104 mEq/L (ref 96–112)
Creatinine, Ser: 0.89 mg/dL (ref 0.40–1.20)
GFR: 60.13 mL/min (ref >60.00)
Glucose, Bld: 176 mg/dL — ABNORMAL HIGH (ref 70–99)
Potassium: 3.9 mEq/L (ref 3.5–5.1)
Sodium: 140 mEq/L (ref 135–145)

## 2019-04-08 LAB — HEMOGLOBIN A1C: Hgb A1c MFr Bld: 7.9 % — ABNORMAL HIGH (ref 4.6–6.5)

## 2019-04-08 LAB — TSH: TSH: 3.13 u[IU]/mL (ref 0.35–4.50)

## 2019-04-10 DIAGNOSIS — J301 Allergic rhinitis due to pollen: Secondary | ICD-10-CM | POA: Diagnosis not present

## 2019-04-12 DIAGNOSIS — E1029 Type 1 diabetes mellitus with other diabetic kidney complication: Secondary | ICD-10-CM | POA: Diagnosis not present

## 2019-04-12 DIAGNOSIS — R809 Proteinuria, unspecified: Secondary | ICD-10-CM | POA: Diagnosis not present

## 2019-04-12 DIAGNOSIS — I10 Essential (primary) hypertension: Secondary | ICD-10-CM | POA: Diagnosis not present

## 2019-04-12 DIAGNOSIS — E1159 Type 2 diabetes mellitus with other circulatory complications: Secondary | ICD-10-CM | POA: Diagnosis not present

## 2019-04-12 DIAGNOSIS — E039 Hypothyroidism, unspecified: Secondary | ICD-10-CM | POA: Diagnosis not present

## 2019-04-15 DIAGNOSIS — J301 Allergic rhinitis due to pollen: Secondary | ICD-10-CM | POA: Diagnosis not present

## 2019-04-22 DIAGNOSIS — M160 Bilateral primary osteoarthritis of hip: Secondary | ICD-10-CM | POA: Diagnosis not present

## 2019-04-22 DIAGNOSIS — G8929 Other chronic pain: Secondary | ICD-10-CM | POA: Diagnosis not present

## 2019-04-22 DIAGNOSIS — M545 Low back pain: Secondary | ICD-10-CM | POA: Diagnosis not present

## 2019-04-22 DIAGNOSIS — J301 Allergic rhinitis due to pollen: Secondary | ICD-10-CM | POA: Diagnosis not present

## 2019-04-29 DIAGNOSIS — J301 Allergic rhinitis due to pollen: Secondary | ICD-10-CM | POA: Diagnosis not present

## 2019-05-02 DIAGNOSIS — M461 Sacroiliitis, not elsewhere classified: Secondary | ICD-10-CM | POA: Diagnosis not present

## 2019-05-02 DIAGNOSIS — E1029 Type 1 diabetes mellitus with other diabetic kidney complication: Secondary | ICD-10-CM | POA: Diagnosis not present

## 2019-05-02 DIAGNOSIS — R809 Proteinuria, unspecified: Secondary | ICD-10-CM | POA: Diagnosis not present

## 2019-05-06 DIAGNOSIS — J301 Allergic rhinitis due to pollen: Secondary | ICD-10-CM | POA: Diagnosis not present

## 2019-05-13 DIAGNOSIS — J301 Allergic rhinitis due to pollen: Secondary | ICD-10-CM | POA: Diagnosis not present

## 2019-05-20 DIAGNOSIS — J301 Allergic rhinitis due to pollen: Secondary | ICD-10-CM | POA: Diagnosis not present

## 2019-05-21 DIAGNOSIS — M533 Sacrococcygeal disorders, not elsewhere classified: Secondary | ICD-10-CM | POA: Diagnosis not present

## 2019-05-23 ENCOUNTER — Ambulatory Visit (INDEPENDENT_AMBULATORY_CARE_PROVIDER_SITE_OTHER): Payer: PPO | Admitting: Internal Medicine

## 2019-05-23 ENCOUNTER — Other Ambulatory Visit: Payer: Self-pay

## 2019-05-23 ENCOUNTER — Encounter: Payer: Self-pay | Admitting: Internal Medicine

## 2019-05-23 VITALS — BP 120/62 | HR 73 | Temp 97.2°F | Resp 16 | Ht 64.0 in | Wt 183.0 lb

## 2019-05-23 DIAGNOSIS — D649 Anemia, unspecified: Secondary | ICD-10-CM

## 2019-05-23 DIAGNOSIS — E7800 Pure hypercholesterolemia, unspecified: Secondary | ICD-10-CM

## 2019-05-23 DIAGNOSIS — I10 Essential (primary) hypertension: Secondary | ICD-10-CM

## 2019-05-23 DIAGNOSIS — E1065 Type 1 diabetes mellitus with hyperglycemia: Secondary | ICD-10-CM | POA: Diagnosis not present

## 2019-05-23 DIAGNOSIS — Z1231 Encounter for screening mammogram for malignant neoplasm of breast: Secondary | ICD-10-CM | POA: Diagnosis not present

## 2019-05-23 DIAGNOSIS — M4800 Spinal stenosis, site unspecified: Secondary | ICD-10-CM | POA: Diagnosis not present

## 2019-05-23 DIAGNOSIS — E038 Other specified hypothyroidism: Secondary | ICD-10-CM | POA: Diagnosis not present

## 2019-05-23 DIAGNOSIS — I779 Disorder of arteries and arterioles, unspecified: Secondary | ICD-10-CM | POA: Diagnosis not present

## 2019-05-23 DIAGNOSIS — Z9109 Other allergy status, other than to drugs and biological substances: Secondary | ICD-10-CM

## 2019-05-23 DIAGNOSIS — Z0000 Encounter for general adult medical examination without abnormal findings: Secondary | ICD-10-CM

## 2019-05-23 DIAGNOSIS — K219 Gastro-esophageal reflux disease without esophagitis: Secondary | ICD-10-CM

## 2019-05-23 NOTE — Progress Notes (Signed)
Patient ID: Marissa Huff, female   DOB: [birth year], 86 y.o.   MRN: [id number]   Subjective:    Patient ID: Marissa Huff, female    DOB: 1931/07/28, 86 y.o.   MRN: [id number]  HPI This visit occurred during the SARS-CoV-2 public health emergency.  Safety protocols were in place, including screening questions prior to the visit, additional usage of staff PPE, and extensive cleaning of exam room while observing appropriate contact time as indicated for disinfecting solutions.  Patient here for her physical exam.  She reports she is doing relatively well.  Being followed by physiatry.  Just saw Girtha Hake s/;p injection - SI.  Helped.  Saw Vance Peper yesterday.  Discussed physical therapy.  Sees Dr Honor Junes for f/u of her diabetes.  Last e evaluated 04/12/19.  Stable.  Recommended f/u in 6 months.  Seeing vascular for f/u carotids.  Last evaluated 01/2019.  Stable.  Recommended f/u in one year.  Allergies controlled - allergy shots, antihistamine and mucinex.  protonix helps.  No upper symptoms reported.  Metamucil helps bowels.  No blood.  Blood pressure doing well.     Past Medical History:  Diagnosis Date  . Allergy   . Arthritis   . Carpal tunnel syndrome, bilateral   . Diabetes mellitus without complication ([place])   . Diverticulosis   . 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 Gastroparesis   . GERD (gastroesophageal reflux disease)   . Hypertension   . Hypothyroidism   . PUD (peptic ulcer disease)   . Skin cancer   . Spinal stenosis    lumbar laminectomy   Past Surgical History:  Procedure Laterality Date  . ABDOMINAL HYSTERECTOMY    . antrectomy and vagotomy  1994  . arthroscopic left knee  12/05/08  . BACK SURGERY    . carpal tunnel repai  10/10   left   . CATARACT EXTRACTION, BILATERAL    . EYE SURGERY     bilateral cataracts  . 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 LAPAROSCOPIC CHOLECYSTECTOMY  6/01  . LUMBAR LAMINECTOMY  1999  . LUMBAR LAMINECTOMY WITH COFLEX 1 LEVEL N/A  09/15/2015   Procedure: Lumbar one-two Laminectomy with placement of coflex;  Surgeon: Kristeen Miss, MD;  Location: [facility];  Service: Neurosurgery;  Laterality: N/A;  . PARTIAL HYSTERECTOMY  1960   fibroid tumors, ovaries not removed  . tonsillectomy    . TONSILLECTOMY     Family History  Problem Relation Age of Onset  . Stroke Mother   . Stroke Father   . Hypertension Father   . Diabetes Brother   . Multiple sclerosis Brother   . Breast cancer Daughter 11  . Colon cancer Neg Hx    Social History   Socioeconomic History  . Marital status: Married    Spouse name: Not on file  . Number of children: Not on file  . Years of education: Not on file  . Highest education level: Not on file  Occupational History  . Not on file  Tobacco Use  . Smoking status: Never Smoker  . Smokeless tobacco: Never Used  Substance and Sexual Activity  . Alcohol use: No    Alcohol/week: 0.0 standard drinks  . Drug use: No  . Sexual activity: Not Currently    Birth control/protection: None, Post-menopausal  Other Topics Concern  . Not on file  Social History Narrative  . Not on file   Social Determinants of Health   Financial  Resource Strain:   . Difficulty of Paying Living Expenses:   Food Insecurity:   . Worried About Charity fundraiser in the Last Year:   . Arboriculturist in the Last Year:   Transportation Needs:   . Film/video editor (Medical):   Marland Kitchen Lack of Transportation (Non-Medical):   Physical Activity:   . Days of Exercise per Week:   . Minutes of Exercise per Session:   Stress:   . Feeling of Stress :   Social Connections:   . Frequency of Communication with Friends and Family:   . Frequency of Social Gatherings with Friends and Family:   . Attends Religious Services:   . Active Member of Clubs or Organizations:   . Attends Archivist Meetings:   Marland Kitchen Marital Status:     Outpatient Encounter Medications as of 05/23/2019  Medication Sig  . albuterol  (PROVENTIL HFA;VENTOLIN HFA) 108 (90 BASE) MCG/ACT inhaler Inhale 2 puffs into the lungs every 6 (six) hours as needed for wheezing or shortness of breath.  Marland Kitchen amLODipine (NORVASC) 5 MG tablet TAKE 1 TABLET BY MOUTH EVERY DAY  . aspirin 81 MG tablet Take 81 mg by mouth daily.  . BD PEN NEEDLE NANO U/F 32G X 4 MM MISC USE 1 EACH 4 (FOUR) TIMES DAILY  . Calcium Citrate-Vitamin D (CALCIUM + D PO) Take 1 tablet by mouth daily.  . Continuous Blood Gluc Receiver (FREESTYLE LIBRE 14 DAY READER) DEVI Use 1 each as needed .  Use to test blood sugar  . Continuous Blood Gluc Sensor (FREESTYLE LIBRE 14 DAY SENSOR) MISC Use 1 each every 14 (fourteen) days  . fexofenadine (ALLEGRA) 180 MG tablet Take 180 mg by mouth daily.  . fluticasone (FLONASE) 50 MCG/ACT nasal spray SPRAY 2 SPRAYS INTO EACH NOSTRIL EVERY DAY  . glucagon (GLUCAGON EMERGENCY) 1 MG injection Inject 1 mg into the muscle once as needed.  Marland Kitchen glucose blood test strip   . Insulin Aspart Prot & Aspart (NOVOLOG MIX 70/30 [place]) Inject 11 Units into the skin 3 (three) times daily with meals.  Marland Kitchen LANTUS SOLOSTAR 100 UNIT/ML Solostar Pen INJECT 24UNITS AT BEDTIME  . levothyroxine (SYNTHROID) 150 MCG tablet TAKE 1 TABLET BY MOUTH EVERY DAY  . LORazepam (ATIVAN) 0.5 MG tablet Take 0.5-1 tablets (0.25-0.5 mg total) by mouth daily as needed for anxiety.  . metFORMIN (GLUCOPHAGE-XR) 500 MG 24 hr tablet TAKE 1 TABLET BY MOUTH TWICE A DAY  . Multiple Vitamins-Minerals (MULTIVITAMIN PO) Take 1 tablet by mouth daily.   . Multiple Vitamins-Minerals (PRESERVISION AREDS PO) Take 1 capsule by mouth 2 (two) times daily.  Marland Kitchen NOVOLOG FLEXPEN 100 UNIT/ML FlexPen INJECT 10 UNITS SUBCUTANEOUSLY 3 (THREE) TIMES DAILY WITH MEALS  . pantoprazole (PROTONIX) 40 MG tablet TAKE 1 TABLET BY MOUTH TWICE A DAY  . rosuvastatin (CRESTOR) 5 MG tablet Take 1 tablet (5 mg total) by mouth 3 (three) times a week.  . traMADol (ULTRAM) 50 MG tablet Take 1 tablet (50 mg total) by mouth 2 (two)  times daily as needed for moderate pain.  . valsartan-hydrochlorothiazide (DIOVAN-HCT) 160-12.5 MG tablet TAKE 1 TABLET BY MOUTH EVERY DAY   No facility-administered encounter medications on file as of 05/23/2019.   Review of Systems  Constitutional: Negative for appetite change and unexpected weight change.  HENT: Negative for congestion and sinus pressure.   Eyes: Negative for pain and visual disturbance.  Respiratory: Negative for cough, chest tightness and shortness of breath.  Cardiovascular: Negative for chest pain, palpitations and leg swelling.  Gastrointestinal: Negative for abdominal pain, diarrhea, nausea and vomiting.  Genitourinary: Negative for difficulty urinating and dysuria.  Musculoskeletal: Positive for back pain. Negative for joint swelling and myalgias.  Skin: Negative for color change and rash.  Neurological: Negative for dizziness, light-headedness and headaches.  Hematological: Negative for adenopathy. Does not bruise/bleed easily.  Psychiatric/Behavioral: Negative for agitation and dysphoric mood.       Objective:    Physical Exam Vitals reviewed.  Constitutional:      General: She is not in acute distress.    Appearance: Normal appearance. She is well-developed.  HENT:     Head: Normocephalic and atraumatic.     Right Ear: External ear normal.     Left Ear: External ear normal.  Eyes:     General: No scleral icterus.       Right eye: No discharge.        Left eye: No discharge.     Conjunctiva/sclera: Conjunctivae normal.  Neck:     Thyroid: No thyromegaly.  Cardiovascular:     Rate and Rhythm: Normal rate and regular rhythm.  Pulmonary:     Effort: No tachypnea, accessory muscle usage or respiratory distress.     Breath sounds: Normal breath sounds. No decreased breath sounds or wheezing.  Chest:     Breasts:        Right: No inverted nipple, mass, nipple discharge or tenderness (no axillary adenopathy).        Left: No inverted nipple, mass,  nipple discharge or tenderness (no axilarry adenopathy).  Abdominal:     General: Bowel sounds are normal.     Palpations: Abdomen is soft.     Tenderness: There is no abdominal tenderness.  Musculoskeletal:        General: No swelling or tenderness.     Cervical back: Neck supple. No tenderness.  Lymphadenopathy:     Cervical: No cervical adenopathy.  Skin:    Findings: No erythema or rash.  Neurological:     Mental Status: She is alert and oriented to person, place, and time.  Psychiatric:        Mood and Affect: Mood normal.        Behavior: Behavior normal.     BP 120/62   Pulse 73   Temp (!) 97.2 F (36.2 C)   Resp 16   Ht _0  (1.626 m)   Wt 183 lb (83 kg)   SpO2 98%   BMI 31.41 kg/m  Wt Readings from Last 3 Encounters:  05/23/19 183 lb (83 kg)  01/25/19 185 lb (83.9 kg)  01/21/19 189 lb (85.7 kg)     Lab Results  Component Value Date   WBC 6.5 06/01/2018   HGB 12.4 06/01/2018   HCT 35.4 (L) 06/01/2018   PLT 230.0 06/01/2018   GLUCOSE 176 (H) 04/08/2019   CHOL 103 04/08/2019   TRIG 101.0 04/08/2019   HDL 46.30 04/08/2019   LDLCALC 37 04/08/2019   ALT 15 04/08/2019   AST 17 04/08/2019   NA 140 04/08/2019   K 3.9 04/08/2019   CL 104 04/08/2019   CREATININE 0.89 04/08/2019   BUN 21 04/08/2019   CO2 27 04/08/2019   TSH 3.13 04/08/2019   HGBA1C 7.9 (H) 04/08/2019   MICROALBUR 1.9 02/25/2019    NM Renal Imaging Flow W/Pharm  Result Date: 06/14/2018 CLINICAL DATA:  RIGHT hydronephrosis EXAM: NUCLEAR MEDICINE RENAL SCAN WITH DIURETIC ADMINISTRATION TECHNIQUE: Radionuclide angiographic  and sequential renal images were obtained after intravenous injection of radiopharmaceutical. Imaging was continued during slow intravenous injection of Lasix approximately 15 minutes after the start of the examination. RADIOPHARMACEUTICALS:  5.324 mCi Technetium-15mMAG3 IV Pharmaceutical: Lasix 43 mg IV 21 minutes into exam COMPARISON:  CT abdomen/pelvis 04/30/2018  FINDINGS: Flow:  Prompt symmetric arterial flow to the kidneys. Left renogram: Normal uptake, concentration and excretion of tracer by LEFT kidney. Minimally prominent collecting system. Gradual accumulation of tracer within the LEFT kidney with expected diuresis and clearance of tracer following Lasix administration. Analysis of the renogram curve demonstrates a time to peak activity of 18 minutes with a fall to half maximum activity at 43.7 minutes. Right renogram: Normal uptake and concentration of tracer. Excretion of tracer into a dilated RIGHT renal collecting system. Persistent retention of tracer within the dilated collecting system throughout the exam. Minimal clearance of tracer following Lasix administration indicative of urinary outflow obstruction. Analysis of the renogram curve demonstrates a delayed time to peak activity of 24.5 minutes with failure to clear tracer following Lasix. Significant retention of tracer within the dilated collecting system at the conclusion of the exam. Findings are consistent with urinary outflow obstruction from the RIGHT kidney. Differential: Left kidney = 61 % Right kidney = 39 % T1/2 post Lasix : Left kidney = 23.7 min Right kidney = N/A min IMPRESSION: Normal LEFT renogram. Dilated RIGHT renal collecting system with evidence of urinary outflow obstruction from the RIGHT kidney as above. Electronically Signed   By: MLavonia DanaM.D.   On: 06/14/2018 14:12       Assessment & Plan:   Problem List Items Addressed This Visit    Anemia    Follow cbc.       Carotid artery disease, unspecified laterality ([place]    Seeing [facility].  Last evaluated 01/2019.  Stable.  Recommended f/u in one year.        Environmental allergies    Sees Dr JKathyrn Sheriff  Receives allergy injections.  Continues antihistamine daily and mucinex.  Stable.       GERD (gastroesophageal reflux disease)    Upper symptoms controlled on protonix.        Health care maintenance    Physical today  05/23/19.  Schedule mammogram.        Hypercholesterolemia    Low cholesterol diet and exercise.  Desires not to take cholesterol medication.  Follow lipid panel.       Relevant Orders   Hepatic function panel   Lipid panel   Hypertension    Blood pressure doing well on valsartan/hctz and amlodipine.  Follow pressures.  Follow metabolic panel.        Relevant Orders   CBC with Differential/Platelet   Basic metabolic panel   Hypothyroidism    On thyroid replacement.  Follow tsh.        Spinal stenosis    S/p surgery.  Persistent back pain.  Followed by physiatry and ortho.       Type 1 diabetes mellitus with hyperglycemia ([facility])    Low carb diet and exercise. Sees Dr OHonor Junes  Follow met b and a1c.        Relevant Orders   Hemoglobin A1c    Other Visit Diagnoses    Visit for screening mammogram    -  Primary   Relevant Orders   MM 3D SCREEN BREAST BILATERAL       CEinar Pheasant MD

## 2019-05-23 NOTE — Assessment & Plan Note (Signed)
Physical today 05/23/19.  Schedule mammogram.

## 2019-05-26 ENCOUNTER — Encounter: Payer: Self-pay | Admitting: Internal Medicine

## 2019-05-26 NOTE — Assessment & Plan Note (Signed)
Low carb diet and exercise. Sees Dr O'Connell.  Follow met b and a1c.   

## 2019-05-26 NOTE — Assessment & Plan Note (Signed)
Follow cbc.  

## 2019-05-26 NOTE — Assessment & Plan Note (Signed)
Upper symptoms controlled on protonix.  

## 2019-05-26 NOTE — Assessment & Plan Note (Signed)
S/p surgery.  Persistent back pain.  Followed by physiatry and ortho.

## 2019-05-26 NOTE — Assessment & Plan Note (Signed)
Sees Dr Kathyrn Sheriff.  Receives allergy injections.  Continues antihistamine daily and mucinex.  Stable.

## 2019-05-26 NOTE — Assessment & Plan Note (Signed)
Seeing [facility].  Last evaluated 01/2019.  Stable.  Recommended f/u in one year.   

## 2019-05-26 NOTE — Assessment & Plan Note (Signed)
On thyroid replacement.  Follow tsh.  

## 2019-05-26 NOTE — Assessment & Plan Note (Signed)
Low cholesterol diet and exercise.  Desires not to take cholesterol medication.  Follow lipid panel.

## 2019-05-26 NOTE — Assessment & Plan Note (Signed)
Blood pressure doing well on valsartan/hctz and amlodipine.  Follow pressures.  Follow metabolic panel.

## 2019-05-27 DIAGNOSIS — J301 Allergic rhinitis due to pollen: Secondary | ICD-10-CM | POA: Diagnosis not present

## 2019-06-04 DIAGNOSIS — J301 Allergic rhinitis due to pollen: Secondary | ICD-10-CM | POA: Diagnosis not present

## 2019-06-05 ENCOUNTER — Ambulatory Visit
Admission: RE | Admit: 2019-06-05 | Discharge: 2019-06-05 | Disposition: A | Discharge status: Home / Self Care | Payer: PPO | Source: Ambulatory Visit | Attending: Internal Medicine | Admitting: Internal Medicine

## 2019-06-05 ENCOUNTER — Other Ambulatory Visit: Payer: Self-pay

## 2019-06-05 DIAGNOSIS — Z1231 Encounter for screening mammogram for malignant neoplasm of breast: Secondary | ICD-10-CM

## 2019-06-10 DIAGNOSIS — J301 Allergic rhinitis due to pollen: Secondary | ICD-10-CM | POA: Diagnosis not present

## 2019-06-18 DIAGNOSIS — J301 Allergic rhinitis due to pollen: Secondary | ICD-10-CM | POA: Diagnosis not present

## 2019-06-19 ENCOUNTER — Other Ambulatory Visit: Payer: Self-pay

## 2019-06-19 MED ORDER — AMLODIPINE BESYLATE 5 MG PO TABS: 5.0000 mg | ORAL_TABLET | Freq: Every day | ORAL | 1 refills | Status: DC

## 2019-06-19 MED ORDER — PANTOPRAZOLE SODIUM 40 MG PO TBEC: 40.0000 mg | DELAYED_RELEASE_TABLET | Freq: Two times a day (BID) | ORAL | 1 refills | Status: DC

## 2019-06-19 MED ORDER — METFORMIN HCL ER 500 MG PO TB24: 500.0000 mg | ORAL_TABLET | Freq: Two times a day (BID) | ORAL | 1 refills | Status: DC

## 2019-06-20 DIAGNOSIS — M5136 Other intervertebral disc degeneration, lumbar region: Secondary | ICD-10-CM | POA: Diagnosis not present

## 2019-06-20 DIAGNOSIS — M47816 Spondylosis without myelopathy or radiculopathy, lumbar region: Secondary | ICD-10-CM | POA: Diagnosis not present

## 2019-06-20 DIAGNOSIS — M5416 Radiculopathy, lumbar region: Secondary | ICD-10-CM | POA: Diagnosis not present

## 2019-06-25 ENCOUNTER — Ambulatory Visit: Payer: PPO | Admitting: Urology

## 2019-06-26 ENCOUNTER — Other Ambulatory Visit: Payer: Self-pay

## 2019-06-26 ENCOUNTER — Ambulatory Visit: Payer: PPO | Admitting: Urology

## 2019-06-26 ENCOUNTER — Encounter: Payer: Self-pay | Admitting: Urology

## 2019-06-26 ENCOUNTER — Other Ambulatory Visit: Payer: Self-pay | Admitting: Internal Medicine

## 2019-06-26 VITALS — BP 121/60 | HR 75 | Ht 66.0 in | Wt 187.7 lb

## 2019-06-26 DIAGNOSIS — N200 Calculus of kidney: Secondary | ICD-10-CM | POA: Diagnosis not present

## 2019-06-26 DIAGNOSIS — N390 Urinary tract infection, site not specified: Secondary | ICD-10-CM

## 2019-06-26 DIAGNOSIS — Q6211 Congenital occlusion of ureteropelvic junction: Secondary | ICD-10-CM | POA: Diagnosis not present

## 2019-06-26 NOTE — Progress Notes (Signed)
06/26/2019 3:46 PM   Marissa Huff 23-May-1931 [id number]  Referring provider: Einar Pheasant, [address] [place],  [place] [id number] Chief Complaint  Patient presents with  . Follow-up    Urologic history: 1.  Right UPJ obstruction -Asymptomatic and dating back to at least 2014 -Previously followed by Dr. Jacqlyn Larsen -CT abdomen/pelvis 04/2018 right hydronephrosis with 10/12 mm renal calculi -Lasix renogram 06/2018 obstruction present with differential function 39% right kidney -Completely asymptomatic and elected surveillance  HPI: Marissa Huff is a 86 y.o. who presents for an annual follow up.  -Since visit last year, she has no complaints.  -Denies flank, pelvic or abdominal pain -Creatinine has been stable -Denies dysuria or gross hematuria  PMH: Past Medical History:  Diagnosis Date  . Allergy   . Arthritis   . Carpal tunnel syndrome, bilateral   . Diabetes mellitus without complication ([place])   . Diverticulosis   . 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 Gastroparesis   . GERD (gastroesophageal reflux disease)   . Hypertension   . Hypothyroidism   . PUD (peptic ulcer disease)   . Skin cancer   . Spinal stenosis    lumbar laminectomy    Surgical History: Past Surgical History:  Procedure Laterality Date  . ABDOMINAL HYSTERECTOMY    . antrectomy and vagotomy  1994  . arthroscopic left knee  12/05/08  . BACK SURGERY    . carpal tunnel repai  10/10   left   . CATARACT EXTRACTION, BILATERAL    . EYE SURGERY     bilateral cataracts  . 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 LAPAROSCOPIC CHOLECYSTECTOMY  6/01  . LUMBAR LAMINECTOMY  1999  . LUMBAR LAMINECTOMY WITH COFLEX 1 LEVEL N/A 09/15/2015   Procedure: Lumbar one-two Laminectomy with placement of coflex;  Surgeon: Kristeen Miss, MD;  Location: [facility];  Service: Neurosurgery;  Laterality: N/A;  . PARTIAL HYSTERECTOMY  1960   fibroid tumors, ovaries not removed  . tonsillectomy    .  TONSILLECTOMY      Home Medications:  Allergies as of 06/26/2019      Reactions   Penicillins Rash      Medication List       Accurate as of June 26, 2019  3:46 PM. If you have any questions, ask your nurse or doctor.        albuterol 108 (90 Base) MCG/ACT inhaler Commonly known as: VENTOLIN HFA Inhale 2 puffs into the lungs every 6 (six) hours as needed for wheezing or shortness of breath.   amLODipine 5 MG tablet Commonly known as: NORVASC Take 1 tablet (5 mg total) by mouth daily.   aspirin 81 MG tablet Take 81 mg by mouth daily.   BD Pen Needle Nano U/F 32G X 4 MM Misc Generic drug: Insulin Pen Needle USE 1 EACH 4 (FOUR) TIMES DAILY   CALCIUM + D PO Take 1 tablet by mouth daily.   fexofenadine 180 MG tablet Commonly known as: ALLEGRA Take 180 mg by mouth daily.   fluticasone 50 MCG/ACT nasal spray Commonly known as: FLONASE SPRAY 2 SPRAYS INTO EACH NOSTRIL EVERY DAY   FreeStyle Libre 14 Day Reader [facility] Use 1 each as needed .  Use to test blood sugar   FreeStyle Libre 14 Day Sensor Misc Use 1 each every 14 (fourteen) days   glucagon 1 MG injection Inject 1 mg into the muscle once as needed.   glucose blood test strip  HumaLOG KwikPen 100 UNIT/ML KwikPen Generic drug: insulin lispro   Lantus SoloStar 100 UNIT/ML Solostar Pen Generic drug: insulin glargine INJECT 24UNITS AT BEDTIME   levothyroxine 150 MCG tablet Commonly known as: SYNTHROID TAKE 1 TABLET BY MOUTH EVERY DAY   LORazepam 0.5 MG tablet Commonly known as: ATIVAN Take 0.5-1 tablets (0.25-0.5 mg total) by mouth daily as needed for anxiety.   metFORMIN 500 MG 24 hr tablet Commonly known as: GLUCOPHAGE-XR Take 1 tablet (500 mg total) by mouth 2 (two) times daily.   MULTIVITAMIN PO Take 1 tablet by mouth daily.   NovoLOG FlexPen 100 UNIT/ML FlexPen Generic drug: insulin aspart INJECT 10 UNITS SUBCUTANEOUSLY 3 (THREE) TIMES DAILY WITH MEALS   NOVOLOG MIX 70/30 [place] Inject 11 Units  into the skin 3 (three) times daily with meals.   pantoprazole 40 MG tablet Commonly known as: PROTONIX Take 1 tablet (40 mg total) by mouth 2 (two) times daily.   PRESERVISION AREDS PO Take 1 capsule by mouth 2 (two) times daily.   rosuvastatin 5 MG tablet Commonly known as: Crestor Take 1 tablet (5 mg total) by mouth 3 (three) times a week.   traMADol 50 MG tablet Commonly known as: ULTRAM Take 1 tablet (50 mg total) by mouth 2 (two) times daily as needed for moderate pain.   valsartan-hydrochlorothiazide 160-12.5 MG tablet Commonly known as: DIOVAN-HCT TAKE 1 TABLET BY MOUTH EVERY DAY       Allergies:  Allergies  Allergen Reactions  . Penicillins Rash    Family History: Family History  Problem Relation Age of Onset  . Stroke Mother   . Stroke Father   . Hypertension Father   . Diabetes Brother   . Multiple sclerosis Brother   . Breast cancer Daughter 26  . Colon cancer Neg Hx     Social History:  reports that she has never smoked. She has never used smokeless tobacco. She reports that she does not drink alcohol and does not use drugs.   Physical Exam: BP 121/60   Pulse 75   Ht 5\' 6"  (4.034 m)   Wt 187 lb 11.2 oz (85.1 kg)   BMI 30.30 kg/m   Constitutional:  Alert and oriented, No acute distress. HEENT: [place] AT, moist mucus membranes.  Trachea midline, no masses. Cardiovascular: No clubbing, cyanosis, or edema. Respiratory: Normal respiratory effort, no increased work of breathing. Skin: No rashes, bruises or suspicious lesions. Neurologic: Grossly intact, no focal deficits, moving all 4 extremities. Psychiatric: Normal mood and affect.  Laboratory Data:  Lab Results  Component Value Date   CREATININE 0.89 04/08/2019    Assessment & Plan:    1. Right hydronephrosis -Secondary to UPJ obstuction -Asymptomatic and desires conservative management  -Follow up in 1 year with renal ultrasound. She is expected to call earlier if she experiences flank/  abdominal pain or gross hematuria.  2.  Nephrolithiasis -As above  3.  Recurrent UTIs -Improved  [facility] [address], [place],  [address] [phone]  I, Joneen Boers Peace, am acting as a Education administrator for Dr. Nicki Reaper C. Ebone Alcivar.  I have reviewed the above documentation for accuracy and completeness, and I agree with the above.   Abbie Sons, MD

## 2019-06-27 ENCOUNTER — Encounter: Payer: Self-pay | Admitting: Urology

## 2019-07-01 DIAGNOSIS — J301 Allergic rhinitis due to pollen: Secondary | ICD-10-CM | POA: Diagnosis not present

## 2019-07-04 DIAGNOSIS — J301 Allergic rhinitis due to pollen: Secondary | ICD-10-CM | POA: Diagnosis not present

## 2019-07-15 DIAGNOSIS — J301 Allergic rhinitis due to pollen: Secondary | ICD-10-CM | POA: Diagnosis not present

## 2019-07-22 DIAGNOSIS — J301 Allergic rhinitis due to pollen: Secondary | ICD-10-CM | POA: Diagnosis not present

## 2019-07-25 DIAGNOSIS — M47816 Spondylosis without myelopathy or radiculopathy, lumbar region: Secondary | ICD-10-CM | POA: Diagnosis not present

## 2019-07-29 DIAGNOSIS — J301 Allergic rhinitis due to pollen: Secondary | ICD-10-CM | POA: Diagnosis not present

## 2019-08-01 DIAGNOSIS — M47816 Spondylosis without myelopathy or radiculopathy, lumbar region: Secondary | ICD-10-CM | POA: Diagnosis not present

## 2019-08-05 DIAGNOSIS — J301 Allergic rhinitis due to pollen: Secondary | ICD-10-CM | POA: Diagnosis not present

## 2019-08-12 DIAGNOSIS — J301 Allergic rhinitis due to pollen: Secondary | ICD-10-CM | POA: Diagnosis not present

## 2019-08-13 DIAGNOSIS — M47816 Spondylosis without myelopathy or radiculopathy, lumbar region: Secondary | ICD-10-CM | POA: Diagnosis not present

## 2019-08-18 ENCOUNTER — Other Ambulatory Visit: Payer: Self-pay | Admitting: Internal Medicine

## 2019-08-19 DIAGNOSIS — J301 Allergic rhinitis due to pollen: Secondary | ICD-10-CM | POA: Diagnosis not present

## 2019-08-26 DIAGNOSIS — J301 Allergic rhinitis due to pollen: Secondary | ICD-10-CM | POA: Diagnosis not present

## 2019-09-02 DIAGNOSIS — J301 Allergic rhinitis due to pollen: Secondary | ICD-10-CM | POA: Diagnosis not present

## 2019-09-10 DIAGNOSIS — M5416 Radiculopathy, lumbar region: Secondary | ICD-10-CM | POA: Diagnosis not present

## 2019-09-10 DIAGNOSIS — M5136 Other intervertebral disc degeneration, lumbar region: Secondary | ICD-10-CM | POA: Diagnosis not present

## 2019-09-10 DIAGNOSIS — M47816 Spondylosis without myelopathy or radiculopathy, lumbar region: Secondary | ICD-10-CM | POA: Diagnosis not present

## 2019-09-10 DIAGNOSIS — M1612 Unilateral primary osteoarthritis, left hip: Secondary | ICD-10-CM | POA: Diagnosis not present

## 2019-09-10 DIAGNOSIS — J301 Allergic rhinitis due to pollen: Secondary | ICD-10-CM | POA: Diagnosis not present

## 2019-09-16 DIAGNOSIS — J301 Allergic rhinitis due to pollen: Secondary | ICD-10-CM | POA: Diagnosis not present

## 2019-09-19 ENCOUNTER — Other Ambulatory Visit (INDEPENDENT_AMBULATORY_CARE_PROVIDER_SITE_OTHER): Payer: PPO

## 2019-09-19 ENCOUNTER — Other Ambulatory Visit: Payer: Self-pay

## 2019-09-19 DIAGNOSIS — I10 Essential (primary) hypertension: Secondary | ICD-10-CM

## 2019-09-19 DIAGNOSIS — E7800 Pure hypercholesterolemia, unspecified: Secondary | ICD-10-CM

## 2019-09-19 DIAGNOSIS — E1065 Type 1 diabetes mellitus with hyperglycemia: Secondary | ICD-10-CM

## 2019-09-19 LAB — CBC WITH DIFFERENTIAL/PLATELET
Basophils Absolute: 0 10*3/uL (ref 0.0–0.1)
Basophils Relative: 0.9 % (ref 0.0–3.0)
Eosinophils Absolute: 0.2 10*3/uL (ref 0.0–0.7)
Eosinophils Relative: 3.2 % (ref 0.0–5.0)
HCT: 36.2 % (ref 36.0–46.0)
Hemoglobin: 12.2 g/dL (ref 12.0–15.0)
Lymphocytes Relative: 25.6 % (ref 12.0–46.0)
Lymphs Abs: 1.2 10*3/uL (ref 0.7–4.0)
MCHC: 33.6 g/dL (ref 30.0–36.0)
MCV: 93.9 fl (ref 78.0–100.0)
Monocytes Absolute: 0.5 10*3/uL (ref 0.1–1.0)
Monocytes Relative: 11.2 % (ref 3.0–12.0)
Neutro Abs: 2.9 10*3/uL (ref 1.4–7.7)
Neutrophils Relative %: 59.1 % (ref 43.0–77.0)
Platelets: 197 10*3/uL (ref 150.0–400.0)
RBC: 3.85 Mil/uL — ABNORMAL LOW (ref 3.87–5.11)
RDW: 13.4 % (ref 11.5–15.5)
WBC: 4.9 10*3/uL (ref 4.0–10.5)

## 2019-09-19 LAB — HEPATIC FUNCTION PANEL
ALT: 18 U/L (ref 0–35)
AST: 17 U/L (ref 0–37)
Albumin: 4.1 g/dL (ref 3.5–5.2)
Alkaline Phosphatase: 78 U/L (ref 39–117)
Bilirubin, Direct: 0.1 mg/dL (ref 0.0–0.3)
Total Bilirubin: 0.5 mg/dL (ref 0.2–1.2)
Total Protein: 6.7 g/dL (ref 6.0–8.3)

## 2019-09-19 LAB — LIPID PANEL
Cholesterol: 106 mg/dL (ref 0–200)
HDL: 42.3 mg/dL (ref >39.00)
LDL Cholesterol: 46 mg/dL (ref 0–99)
NonHDL: 64.01
Total CHOL/HDL Ratio: 3
Triglycerides: 91 mg/dL (ref 0.0–149.0)
VLDL: 18.2 mg/dL (ref 0.0–40.0)

## 2019-09-19 LAB — BASIC METABOLIC PANEL
BUN: 21 mg/dL (ref 6–23)
CO2: 30 mEq/L (ref 19–32)
Calcium: 9.6 mg/dL (ref 8.4–10.5)
Chloride: 102 mEq/L (ref 96–112)
Creatinine, Ser: 0.85 mg/dL (ref 0.40–1.20)
GFR: 63.34 mL/min (ref >60.00)
Glucose, Bld: 220 mg/dL — ABNORMAL HIGH (ref 70–99)
Potassium: 4.4 mEq/L (ref 3.5–5.1)
Sodium: 138 mEq/L (ref 135–145)

## 2019-09-19 LAB — HEMOGLOBIN A1C: Hgb A1c MFr Bld: 8.2 % — ABNORMAL HIGH (ref 4.6–6.5)

## 2019-09-23 ENCOUNTER — Ambulatory Visit: Payer: PPO | Admitting: Internal Medicine

## 2019-09-23 DIAGNOSIS — J301 Allergic rhinitis due to pollen: Secondary | ICD-10-CM | POA: Diagnosis not present

## 2019-09-25 ENCOUNTER — Other Ambulatory Visit: Payer: Self-pay

## 2019-09-25 ENCOUNTER — Ambulatory Visit (INDEPENDENT_AMBULATORY_CARE_PROVIDER_SITE_OTHER): Payer: PPO | Admitting: Internal Medicine

## 2019-09-25 VITALS — BP 130/72 | HR 82 | Temp 97.6°F | Resp 16 | Wt 188.8 lb

## 2019-09-25 DIAGNOSIS — I779 Disorder of arteries and arterioles, unspecified: Secondary | ICD-10-CM | POA: Diagnosis not present

## 2019-09-25 DIAGNOSIS — I10 Essential (primary) hypertension: Secondary | ICD-10-CM | POA: Diagnosis not present

## 2019-09-25 DIAGNOSIS — K219 Gastro-esophageal reflux disease without esophagitis: Secondary | ICD-10-CM | POA: Diagnosis not present

## 2019-09-25 DIAGNOSIS — N390 Urinary tract infection, site not specified: Secondary | ICD-10-CM

## 2019-09-25 DIAGNOSIS — E7800 Pure hypercholesterolemia, unspecified: Secondary | ICD-10-CM

## 2019-09-25 DIAGNOSIS — D649 Anemia, unspecified: Secondary | ICD-10-CM

## 2019-09-25 DIAGNOSIS — Z23 Encounter for immunization: Secondary | ICD-10-CM

## 2019-09-25 DIAGNOSIS — N200 Calculus of kidney: Secondary | ICD-10-CM

## 2019-09-25 DIAGNOSIS — E038 Other specified hypothyroidism: Secondary | ICD-10-CM

## 2019-09-25 DIAGNOSIS — E1065 Type 1 diabetes mellitus with hyperglycemia: Secondary | ICD-10-CM

## 2019-09-25 DIAGNOSIS — Z9109 Other allergy status, other than to drugs and biological substances: Secondary | ICD-10-CM

## 2019-09-25 DIAGNOSIS — Q6211 Congenital occlusion of ureteropelvic junction: Secondary | ICD-10-CM | POA: Diagnosis not present

## 2019-09-25 DIAGNOSIS — M4800 Spinal stenosis, site unspecified: Secondary | ICD-10-CM | POA: Diagnosis not present

## 2019-09-25 NOTE — Progress Notes (Signed)
Patient ID: Marissa Huff, female   DOB: [birth year], 86 y.o.   MRN: [id number]   Subjective:    Patient ID: Marissa Huff, female    DOB: 1931-08-31, 86 y.o.   MRN: [id number]  HPI This visit occurred during the SARS-CoV-2 public health emergency.  Safety protocols were in place, including screening questions prior to the visit, additional usage of staff PPE, and extensive cleaning of exam room while observing appropriate contact time as indicated for disinfecting solutions.  Patient here for a scheduled follow up.  Here to f/u regarding her sugar, blood pressure and cholesterol.  She reports still having increased back pain.  Seeing Dr Sharlet Salina.  Planning to have "nerve burn" - 10/08/19.  In reviewing, she last saw Dr Sharlet Salina 09/10/19.  Continues on tylenol.  Discontinued tramadol due to constipation.  Planning for RF - bilateral L5-S1 10/08/19.  Seeing endocrinology for f/u of her diabetes.  a1c 8.2 09/19/19.  Discussed low carb diet and exercise.  She is adjusting her insulin - using SSI with meals.  No chest pain.  Breathing stable.  Using flonase.  Allergy shots.  Sees Dr Kathyrn Sheriff.  Followed by Dr Bernardo Heater. Last evaluated 06/26/19 - right hydronephrosis - secondary to UPJ obstruction.  Asymptomatic.  Recommended f/u in one year with renal ultrasound.  No cough or congestion.  No abdominal pain or cramping.  Bowels stable.      Past Medical History:  Diagnosis Date  . Allergy   . Arthritis   . Carpal tunnel syndrome, bilateral   . Diabetes mellitus without complication ([place])   . Diverticulosis   . 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 Gastroparesis   . GERD (gastroesophageal reflux disease)   . Hypertension   . Hypothyroidism   . PUD (peptic ulcer disease)   . Skin cancer   . Spinal stenosis    lumbar laminectomy   Past Surgical History:  Procedure Laterality Date  . ABDOMINAL HYSTERECTOMY    . antrectomy and vagotomy  1994  . arthroscopic left knee  12/05/08  . BACK SURGERY    . carpal tunnel repai   10/10   left   . CATARACT EXTRACTION, BILATERAL    . EYE SURGERY     bilateral cataracts  . 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 LAPAROSCOPIC CHOLECYSTECTOMY  6/01  . LUMBAR LAMINECTOMY  1999  . LUMBAR LAMINECTOMY WITH COFLEX 1 LEVEL N/A 09/15/2015   Procedure: Lumbar one-two Laminectomy with placement of coflex;  Surgeon: Kristeen Miss, MD;  Location: [facility];  Service: Neurosurgery;  Laterality: N/A;  . PARTIAL HYSTERECTOMY  1960   fibroid tumors, ovaries not removed  . tonsillectomy    . TONSILLECTOMY     Family History  Problem Relation Age of Onset  . Stroke Mother   . Stroke Father   . Hypertension Father   . Diabetes Brother   . Multiple sclerosis Brother   . Breast cancer Daughter 40  . Colon cancer Neg Hx    Social History   Socioeconomic History  . Marital status: Married    Spouse name: Not on file  . Number of children: Not on file  . Years of education: Not on file  . Highest education level: Not on file  Occupational History  . Not on file  Tobacco Use  . Smoking status: Never Smoker  . Smokeless tobacco: Never Used  Vaping Use  . Vaping Use: Never used  Substance and Sexual Activity  . Alcohol  use: No    Alcohol/week: 0.0 standard drinks  . Drug use: No  . Sexual activity: Not Currently    Birth control/protection: None, Post-menopausal  Other Topics Concern  . Not on file  Social History Narrative  . Not on file   Social Determinants of Health   Financial Resource Strain:   . Difficulty of Paying Living Expenses: Not on file  Food Insecurity:   . Worried About Charity fundraiser in the Last Year: Not on file  . Ran Out of Food in the Last Year: Not on file  Transportation Needs:   . Lack of Transportation (Medical): Not on file  . Lack of Transportation (Non-Medical): Not on file  Physical Activity:   . Days of Exercise per Week: Not on file  . Minutes of Exercise per Session: Not on file  Stress:   . Feeling of Stress : Not on  file  Social Connections:   . Frequency of Communication with Friends and Family: Not on file  . Frequency of Social Gatherings with Friends and Family: Not on file  . Attends Religious Services: Not on file  . Active Member of Clubs or Organizations: Not on file  . Attends Archivist Meetings: Not on file  . Marital Status: Not on file    Outpatient Encounter Medications as of 09/25/2019  Medication Sig  . albuterol (PROVENTIL HFA;VENTOLIN HFA) 108 (90 BASE) MCG/ACT inhaler Inhale 2 puffs into the lungs every 6 (six) hours as needed for wheezing or shortness of breath.  Marland Kitchen amLODipine (NORVASC) 5 MG tablet Take 1 tablet (5 mg total) by mouth daily.  Marland Kitchen aspirin 81 MG tablet Take 81 mg by mouth daily.  . BD PEN NEEDLE NANO U/F 32G X 4 MM MISC USE 1 EACH 4 (FOUR) TIMES DAILY  . Calcium Citrate-Vitamin D (CALCIUM + D PO) Take 1 tablet by mouth daily.  . Continuous Blood Gluc Receiver (FREESTYLE LIBRE 14 DAY READER) DEVI Use 1 each as needed .  Use to test blood sugar  . Continuous Blood Gluc Sensor (FREESTYLE LIBRE 14 DAY SENSOR) MISC Use 1 each every 14 (fourteen) days  . fexofenadine (ALLEGRA) 180 MG tablet Take 180 mg by mouth daily.  . fluticasone (FLONASE) 50 MCG/ACT nasal spray SPRAY 2 SPRAYS INTO EACH NOSTRIL EVERY DAY  . glucagon (GLUCAGON EMERGENCY) 1 MG injection Inject 1 mg into the muscle once as needed.  Marland Kitchen glucose blood test strip   . HUMALOG KWIKPEN 100 UNIT/ML KwikPen   . Insulin Aspart Prot & Aspart (NOVOLOG MIX 70/30 [place]) Inject 11 Units into the skin 3 (three) times daily with meals.  Marland Kitchen LANTUS SOLOSTAR 100 UNIT/ML Solostar Pen INJECT 24UNITS AT BEDTIME  . levothyroxine (SYNTHROID) 150 MCG tablet TAKE 1 TABLET BY MOUTH EVERY DAY  . LORazepam (ATIVAN) 0.5 MG tablet Take 0.5-1 tablets (0.25-0.5 mg total) by mouth daily as needed for anxiety.  . metFORMIN (GLUCOPHAGE-XR) 500 MG 24 hr tablet Take 1 tablet (500 mg total) by mouth 2 (two) times daily.  . Multiple  Vitamins-Minerals (MULTIVITAMIN PO) Take 1 tablet by mouth daily.   . Multiple Vitamins-Minerals (PRESERVISION AREDS PO) Take 1 capsule by mouth 2 (two) times daily.  Marland Kitchen NOVOLOG FLEXPEN 100 UNIT/ML FlexPen INJECT 10 UNITS SUBCUTANEOUSLY 3 (THREE) TIMES DAILY WITH MEALS  . pantoprazole (PROTONIX) 40 MG tablet Take 1 tablet (40 mg total) by mouth 2 (two) times daily.  . rosuvastatin (CRESTOR) 5 MG tablet TAKE 1 TABLET (5 MG TOTAL) BY  MOUTH 3 (THREE) TIMES A WEEK.  . traMADol (ULTRAM) 50 MG tablet Take 1 tablet (50 mg total) by mouth 2 (two) times daily as needed for moderate pain.  . valsartan-hydrochlorothiazide (DIOVAN-HCT) 160-12.5 MG tablet TAKE 1 TABLET BY MOUTH EVERY DAY   No facility-administered encounter medications on file as of 09/25/2019.    Review of Systems  Constitutional: Negative for appetite change and unexpected weight change.  HENT: Negative for congestion and sinus pressure.   Respiratory: Negative for cough, chest tightness and shortness of breath.   Cardiovascular: Negative for chest pain, palpitations and leg swelling.  Gastrointestinal: Negative for abdominal pain, diarrhea, nausea and vomiting.  Genitourinary: Negative for difficulty urinating and dysuria.  Musculoskeletal: Positive for back pain. Negative for joint swelling and myalgias.  Skin: Negative for color change and rash.  Neurological: Negative for dizziness, light-headedness and headaches.  Psychiatric/Behavioral: Negative for agitation and dysphoric mood.       Objective:    Physical Exam Vitals reviewed.  Constitutional:      General: She is not in acute distress.    Appearance: Normal appearance.  HENT:     Head: Normocephalic and atraumatic.     Right Ear: External ear normal.     Left Ear: External ear normal.  Eyes:     General: No scleral icterus.       Right eye: No discharge.        Left eye: No discharge.     Conjunctiva/sclera: Conjunctivae normal.  Neck:     Thyroid: No  thyromegaly.  Cardiovascular:     Rate and Rhythm: Normal rate and regular rhythm.  Pulmonary:     Effort: No respiratory distress.     Breath sounds: Normal breath sounds. No wheezing.  Abdominal:     General: Bowel sounds are normal.     Palpations: Abdomen is soft.     Tenderness: There is no abdominal tenderness.  Musculoskeletal:        General: No swelling or tenderness.     Cervical back: Neck supple. No tenderness.  Lymphadenopathy:     Cervical: No cervical adenopathy.  Skin:    Findings: No erythema or rash.  Neurological:     Mental Status: She is alert.  Psychiatric:        Mood and Affect: Mood normal.        Behavior: Behavior normal.     BP 130/72   Pulse 82   Temp 97.6 F (36.4 C) (Oral)   Resp 16   Wt 188 lb 12.8 oz (85.6 kg)   SpO2 98%   BMI 30.47 kg/m  Wt Readings from Last 3 Encounters:  09/25/19 188 lb 12.8 oz (85.6 kg)  06/26/19 187 lb 11.2 oz (85.1 kg)  05/23/19 183 lb (83 kg)     Lab Results  Component Value Date   WBC 4.9 09/19/2019   HGB 12.2 09/19/2019   HCT 36.2 09/19/2019   PLT 197.0 09/19/2019   GLUCOSE 220 (H) 09/19/2019   CHOL 106 09/19/2019   TRIG 91.0 09/19/2019   HDL 42.30 09/19/2019   LDLCALC 46 09/19/2019   ALT 18 09/19/2019   AST 17 09/19/2019   NA 138 09/19/2019   K 4.4 09/19/2019   CL 102 09/19/2019   CREATININE 0.85 09/19/2019   BUN 21 09/19/2019   CO2 30 09/19/2019   TSH 3.13 04/08/2019   HGBA1C 8.2 (H) 09/19/2019   MICROALBUR 1.9 02/25/2019    MM 3D SCREEN BREAST BILATERAL  Result  Date: 06/06/2019 CLINICAL DATA:  Screening. EXAM: DIGITAL SCREENING BILATERAL MAMMOGRAM WITH TOMO AND CAD COMPARISON:  Previous exam(s). ACR Breast Density Category b: There are scattered areas of fibroglandular density. FINDINGS: There are no findings suspicious for malignancy. Images were processed with CAD. IMPRESSION: No mammographic evidence of malignancy. A result letter of this screening mammogram will be mailed directly to  the patient. RECOMMENDATION: Screening mammogram in one year. (Code:SM-B-01Y) BI-RADS CATEGORY  1: Negative. Electronically Signed   By: Lovey Newcomer M.D.   On: 06/06/2019 12:23       Assessment & Plan:   Problem List Items Addressed This Visit    Type 1 diabetes mellitus with hyperglycemia (HCC)    Recent a1c 8.2.  Low carb diet and exercise.  Adjusting her insulin  - using SSI.  Sees Dr Honor Junes. Discussed f/u.  Follow met b and a1c.        Relevant Orders   Hemoglobin H6K   Basic metabolic panel   Spinal stenosis    S/p surgery.  Persistent back pain.  ESI - increased sugars.  Seeing Dr Sharlet Salina. Planning for RF 10/08/19.       Recurrent UTI    Followed by urology.  Has done well lately.  Follow.        Nephrolithiasis    Followed by urology.  Last evaluated 06/2019.  Planning for f/u in one year with renal ultrasound.       Hypothyroidism    On thyroid replacement.  Follow tsh.       Hypertension    Blood pressure doing well.  On valsartan/hctz and amlodipine.  Follow pressures.  Follow metabolic panel.       Hypercholesterolemia    Low cholesterol diet and exercise.  On crestor.  Follow lipid panel and liver function tests.        Relevant Orders   Hepatic function panel   Lipid panel   Hydronephrosis    Continue to f/u with urology.  Last evaluated 06/2019.  Planning for f/u in one year with renal ultrasound.  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       GERD (gastroesophageal reflux disease)    Upper symptoms controlled.  Protonix.        Environmental allergies    Followed by Dr Kathyrn Sheriff.  Receives allergy injections.  Stable.        Carotid artery disease, unspecified laterality ([place])    Seeing [facility].  Last evaluated 01/2019.  Stable.  Recommended f/u in one year.        Anemia    Follow cbc.        Other Visit Diagnoses    Need for immunization against influenza    -  Primary   Relevant Orders   Flu Vaccine QUAD High Dose(Fluad) (Completed)        Einar Pheasant, MD

## 2019-09-30 DIAGNOSIS — J301 Allergic rhinitis due to pollen: Secondary | ICD-10-CM | POA: Diagnosis not present

## 2019-10-01 DIAGNOSIS — H353132 Nonexudative age-related macular degeneration, bilateral, intermediate dry stage: Secondary | ICD-10-CM | POA: Diagnosis not present

## 2019-10-02 DIAGNOSIS — J301 Allergic rhinitis due to pollen: Secondary | ICD-10-CM | POA: Diagnosis not present

## 2019-10-06 ENCOUNTER — Encounter: Payer: Self-pay | Admitting: Internal Medicine

## 2019-10-06 NOTE — Assessment & Plan Note (Signed)
Upper symptoms controlled.  Protonix.

## 2019-10-06 NOTE — Assessment & Plan Note (Signed)
Low cholesterol diet and exercise.  On crestor.  Follow lipid panel and liver function tests.  

## 2019-10-06 NOTE — Assessment & Plan Note (Signed)
Seeing [facility].  Last evaluated 01/2019.  Stable.  Recommended f/u in one year.

## 2019-10-06 NOTE — Assessment & Plan Note (Signed)
Continue to f/u with urology.  Last evaluated 06/2019.  Planning for f/u in one year with renal ultrasound.  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

## 2019-10-06 NOTE — Assessment & Plan Note (Signed)
Follow cbc.  

## 2019-10-06 NOTE — Assessment & Plan Note (Signed)
On thyroid replacement.  Follow tsh.  

## 2019-10-06 NOTE — Assessment & Plan Note (Signed)
Blood pressure doing well.  On valsartan/hctz and amlodipine.  Follow pressures.  Follow metabolic panel.

## 2019-10-06 NOTE — Assessment & Plan Note (Signed)
Followed by urology.  Last evaluated 06/2019.  Planning for f/u in one year with renal ultrasound.

## 2019-10-06 NOTE — Assessment & Plan Note (Signed)
Recent a1c 8.2.  Low carb diet and exercise.  Adjusting her insulin  - using SSI.  Sees Dr Honor Junes. Discussed f/u.  Follow met b and a1c.

## 2019-10-06 NOTE — Assessment & Plan Note (Signed)
Followed by urology.  Has done well lately.  Follow.

## 2019-10-06 NOTE — Assessment & Plan Note (Signed)
Followed by Dr Kathyrn Sheriff.  Receives allergy injections.  Stable.

## 2019-10-06 NOTE — Assessment & Plan Note (Addendum)
S/p surgery.  Persistent back pain.  ESI - increased sugars.  Seeing Dr Sharlet Salina. Planning for RF 10/08/19.

## 2019-10-07 DIAGNOSIS — J301 Allergic rhinitis due to pollen: Secondary | ICD-10-CM | POA: Diagnosis not present

## 2019-10-08 DIAGNOSIS — M47816 Spondylosis without myelopathy or radiculopathy, lumbar region: Secondary | ICD-10-CM | POA: Diagnosis not present

## 2019-10-14 DIAGNOSIS — E1159 Type 2 diabetes mellitus with other circulatory complications: Secondary | ICD-10-CM | POA: Diagnosis not present

## 2019-10-14 DIAGNOSIS — I152 Hypertension secondary to endocrine disorders: Secondary | ICD-10-CM | POA: Diagnosis not present

## 2019-10-14 DIAGNOSIS — E785 Hyperlipidemia, unspecified: Secondary | ICD-10-CM | POA: Diagnosis not present

## 2019-10-14 DIAGNOSIS — E1069 Type 1 diabetes mellitus with other specified complication: Secondary | ICD-10-CM | POA: Diagnosis not present

## 2019-10-14 DIAGNOSIS — E039 Hypothyroidism, unspecified: Secondary | ICD-10-CM | POA: Diagnosis not present

## 2019-10-14 DIAGNOSIS — R809 Proteinuria, unspecified: Secondary | ICD-10-CM | POA: Diagnosis not present

## 2019-10-14 DIAGNOSIS — J301 Allergic rhinitis due to pollen: Secondary | ICD-10-CM | POA: Diagnosis not present

## 2019-10-14 DIAGNOSIS — E1029 Type 1 diabetes mellitus with other diabetic kidney complication: Secondary | ICD-10-CM | POA: Diagnosis not present

## 2019-10-21 DIAGNOSIS — J301 Allergic rhinitis due to pollen: Secondary | ICD-10-CM | POA: Diagnosis not present

## 2019-10-23 DIAGNOSIS — J301 Allergic rhinitis due to pollen: Secondary | ICD-10-CM | POA: Diagnosis not present

## 2019-10-23 DIAGNOSIS — K219 Gastro-esophageal reflux disease without esophagitis: Secondary | ICD-10-CM | POA: Diagnosis not present

## 2019-10-28 DIAGNOSIS — J301 Allergic rhinitis due to pollen: Secondary | ICD-10-CM | POA: Diagnosis not present

## 2019-11-04 DIAGNOSIS — J301 Allergic rhinitis due to pollen: Secondary | ICD-10-CM | POA: Diagnosis not present

## 2019-11-11 DIAGNOSIS — J301 Allergic rhinitis due to pollen: Secondary | ICD-10-CM | POA: Diagnosis not present

## 2019-11-18 DIAGNOSIS — J301 Allergic rhinitis due to pollen: Secondary | ICD-10-CM | POA: Diagnosis not present

## 2019-11-19 DIAGNOSIS — M5416 Radiculopathy, lumbar region: Secondary | ICD-10-CM | POA: Diagnosis not present

## 2019-11-19 DIAGNOSIS — M47816 Spondylosis without myelopathy or radiculopathy, lumbar region: Secondary | ICD-10-CM | POA: Diagnosis not present

## 2019-11-19 DIAGNOSIS — M5136 Other intervertebral disc degeneration, lumbar region: Secondary | ICD-10-CM | POA: Diagnosis not present

## 2019-11-25 DIAGNOSIS — J301 Allergic rhinitis due to pollen: Secondary | ICD-10-CM | POA: Diagnosis not present

## 2019-12-02 DIAGNOSIS — J301 Allergic rhinitis due to pollen: Secondary | ICD-10-CM | POA: Diagnosis not present

## 2019-12-09 DIAGNOSIS — J301 Allergic rhinitis due to pollen: Secondary | ICD-10-CM | POA: Diagnosis not present

## 2019-12-14 ENCOUNTER — Other Ambulatory Visit: Payer: Self-pay | Admitting: Internal Medicine

## 2019-12-16 ENCOUNTER — Other Ambulatory Visit: Payer: Self-pay | Admitting: Internal Medicine

## 2019-12-17 DIAGNOSIS — J301 Allergic rhinitis due to pollen: Secondary | ICD-10-CM | POA: Diagnosis not present

## 2019-12-18 ENCOUNTER — Other Ambulatory Visit: Payer: Self-pay | Admitting: Internal Medicine

## 2019-12-20 ENCOUNTER — Ambulatory Visit
Admission: EM | Admit: 2019-12-20 | Discharge: 2019-12-20 | Disposition: A | Discharge status: Home / Self Care | Payer: PPO | Attending: Family Medicine | Admitting: Family Medicine

## 2019-12-20 ENCOUNTER — Other Ambulatory Visit: Payer: Self-pay

## 2019-12-20 DIAGNOSIS — Z794 Long term (current) use of insulin: Secondary | ICD-10-CM | POA: Insufficient documentation

## 2019-12-20 DIAGNOSIS — Z8711 Personal history of peptic ulcer disease: Secondary | ICD-10-CM | POA: Insufficient documentation

## 2019-12-20 DIAGNOSIS — R059 Cough, unspecified: Secondary | ICD-10-CM | POA: Insufficient documentation

## 2019-12-20 DIAGNOSIS — Z7984 Long term (current) use of oral hypoglycemic drugs: Secondary | ICD-10-CM | POA: Insufficient documentation

## 2019-12-20 DIAGNOSIS — J069 Acute upper respiratory infection, unspecified: Secondary | ICD-10-CM | POA: Insufficient documentation

## 2019-12-20 DIAGNOSIS — Z7982 Long term (current) use of aspirin: Secondary | ICD-10-CM | POA: Diagnosis not present

## 2019-12-20 DIAGNOSIS — Z79890 Hormone replacement therapy (postmenopausal): Secondary | ICD-10-CM | POA: Insufficient documentation

## 2019-12-20 DIAGNOSIS — Z20822 Contact with and (suspected) exposure to covid-19: Secondary | ICD-10-CM | POA: Diagnosis not present

## 2019-12-20 DIAGNOSIS — Z9049 Acquired absence of other specified parts of digestive tract: Secondary | ICD-10-CM | POA: Insufficient documentation

## 2019-12-20 DIAGNOSIS — Z79899 Other long term (current) drug therapy: Secondary | ICD-10-CM | POA: Diagnosis not present

## 2019-12-20 DIAGNOSIS — Z880 Allergy status to penicillin: Secondary | ICD-10-CM | POA: Insufficient documentation

## 2019-12-20 LAB — RESP PANEL BY RT-PCR (FLU A&B, COVID) ARPGX2
Influenza A by PCR: NEGATIVE
Influenza B by PCR: NEGATIVE
SARS Coronavirus 2 by RT PCR: NEGATIVE

## 2019-12-20 MED ORDER — BENZONATATE 200 MG PO CAPS: 200.0000 mg | ORAL_CAPSULE | Freq: Three times a day (TID) | ORAL | 0 refills | Status: DC | PRN

## 2019-12-20 MED ORDER — IPRATROPIUM BROMIDE 0.06 % NA SOLN: 2.0000 | Freq: Four times a day (QID) | NASAL | 0 refills | Status: DC | PRN

## 2019-12-20 NOTE — ED Triage Notes (Signed)
Patient complains of cough and sneezing and productive mucus. States that she has not had any covid testing.

## 2019-12-20 NOTE — ED Provider Notes (Signed)
[facility]    CSN: [id number] Arrival date & time: 12/20/19  1201      History   Chief Complaint Chief Complaint  Patient presents with  . Cough   HPI  86 year old female presents with respiratory symptoms.  Patient states that her symptoms started "a few days ago".  She reports cough, sneezing.  No fever.  No shortness of breath.  States that her cough is productive of green mucus.  Patient is concerned that she has a "infection".  No reported sick contacts.  Has not been tested for COVID-19 recently.  No relieving factors.  No other complaints.  Past Medical History:  Diagnosis Date  . Allergy   . Arthritis   . Carpal tunnel syndrome, bilateral   . Diabetes mellitus without complication ([place])   . Diverticulosis   . 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 Gastroparesis   . GERD (gastroesophageal reflux disease)   . Hypertension   . Hypothyroidism   . PUD (peptic ulcer disease)   . Skin cancer   . Spinal stenosis    lumbar laminectomy    Patient Active Problem List   Diagnosis Date Noted  . Macular degeneration 10/01/2018  . Carotid artery disease, unspecified laterality ([place]) 02/23/2018  . Right carotid bruit 02/04/2018  . Lumbar stenosis with neurogenic claudication 09/15/2015  . Nephrolithiasis 06/28/2015  . Health care maintenance 05/11/2014  . Obesity (BMI 30-39.9) 01/11/2014  . GERD (gastroesophageal reflux disease) 01/11/2014  . Environmental allergies 01/11/2014  . CKD stage 2 due to type 2 diabetes mellitus ([place]) 10/23/2013  . Hydronephrosis 08/26/2013  . Constipation 08/05/2013  . DDD (degenerative disc disease), lumbar 07/15/2013  . Lumbar radiculitis 05/10/2013  . Right shoulder pain 05/10/2013  . Anemia 04/15/2013  . Carpal tunnel syndrome 04/15/2013  . Trigger finger 04/15/2013  . Hyperlipidemia due to type 1 diabetes mellitus ([place]) 04/15/2013  . Type II diabetes mellitus ([place]) 04/15/2013  . Microscopic hematuria 09/20/2012  . Incomplete  emptying of bladder 02/27/2012  . Mixed urge and stress incontinence 02/27/2012  . Symptoms involving urinary system 02/27/2012  . Recurrent UTI 01/27/2012  . History of lymphoma 12/12/2011  . Spinal stenosis 12/12/2011  . Diverticulosis 12/12/2011  . Gastroparesis 12/12/2011  . Peptic ulcer disease 12/12/2011  . Hypothyroidism 12/12/2011  . Hypercholesterolemia 12/12/2011  . Hypertension 12/12/2011  . Type 1 diabetes mellitus with hyperglycemia ([place]) 12/12/2011  . Diverticulosis of colon 12/12/2011    Past Surgical History:  Procedure Laterality Date  . ABDOMINAL HYSTERECTOMY    . antrectomy and vagotomy  1994  . arthroscopic left knee  12/05/08  . BACK SURGERY    . carpal tunnel repai  10/10   left   . CATARACT EXTRACTION, BILATERAL    . EYE SURGERY     bilateral cataracts  . 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 LAPAROSCOPIC CHOLECYSTECTOMY  6/01  . LUMBAR LAMINECTOMY  1999  . LUMBAR LAMINECTOMY WITH COFLEX 1 LEVEL N/A 09/15/2015   Procedure: Lumbar one-two Laminectomy with placement of coflex;  Surgeon: Kristeen Miss, MD;  Location: [facility];  Service: Neurosurgery;  Laterality: N/A;  . PARTIAL HYSTERECTOMY  1960   fibroid tumors, ovaries not removed  . tonsillectomy    . TONSILLECTOMY      OB History   No obstetric history on file.      Home Medications    Prior to Admission medications   Medication Sig Start Date End Date Taking? Authorizing Provider  albuterol (PROVENTIL  HFA;VENTOLIN HFA) 108 (90 BASE) MCG/ACT inhaler Inhale 2 puffs into the lungs every 6 (six) hours as needed for wheezing or shortness of breath. 09/03/13  Yes Einar Pheasant, MD  amLODipine (NORVASC) 5 MG tablet TAKE 1 TABLET BY MOUTH EVERY DAY 12/16/19  Yes Einar Pheasant, MD  aspirin 81 MG tablet Take 81 mg by mouth daily.   Yes [provider]  BD PEN NEEDLE NANO U/F 32G X 4 MM MISC USE 1 EACH 4 (FOUR) TIMES DAILY 04/03/18  Yes [provider]  Calcium Citrate-Vitamin D  (CALCIUM + D PO) Take 1 tablet by mouth daily.   Yes [provider]  Continuous Blood Gluc Receiver (FREESTYLE LIBRE 14 DAY READER) DEVI Use 1 each as needed .  Use to test blood sugar 10/25/17  Yes [provider]  Continuous Blood Gluc Sensor (FREESTYLE LIBRE 14 DAY SENSOR) MISC Use 1 each every 14 (fourteen) days 10/25/17  Yes [provider]  fexofenadine (ALLEGRA) 180 MG tablet Take 180 mg by mouth daily.   Yes [provider]  fluticasone (FLONASE) 50 MCG/ACT nasal spray SPRAY 2 SPRAYS INTO EACH NOSTRIL EVERY DAY 01/03/19  Yes Einar Pheasant, MD  glucagon (GLUCAGON EMERGENCY) 1 MG injection Inject 1 mg into the muscle once as needed. 05/21/15  Yes Einar Pheasant, MD  glucose blood test strip  10/21/13  Yes [provider]  Insulin Aspart Prot & Aspart (NOVOLOG MIX 70/30 [place]) Inject 11 Units into the skin 3 (three) times daily with meals.   Yes [provider]  LANTUS SOLOSTAR 100 UNIT/ML Solostar Pen INJECT 24UNITS AT BEDTIME 03/08/17  Yes [provider]  levothyroxine (SYNTHROID) 150 MCG tablet TAKE 1 TABLET BY MOUTH EVERY DAY 12/16/19  Yes Einar Pheasant, MD  LORazepam (ATIVAN) 0.5 MG tablet Take 0.5-1 tablets (0.25-0.5 mg total) by mouth daily as needed for anxiety. 05/05/17  Yes Einar Pheasant, MD  metFORMIN (GLUCOPHAGE-XR) 500 MG 24 hr tablet TAKE 1 TABLET BY MOUTH TWICE A DAY 12/16/19  Yes Einar Pheasant, MD  Multiple Vitamins-Minerals (MULTIVITAMIN PO) Take 1 tablet by mouth daily.    Yes [provider]  Multiple Vitamins-Minerals (PRESERVISION AREDS PO) Take 1 capsule by mouth 2 (two) times daily.   Yes [provider]  pantoprazole (PROTONIX) 40 MG tablet TAKE 1 TABLET BY MOUTH TWICE A DAY 12/16/19  Yes Einar Pheasant, MD  rosuvastatin (CRESTOR) 5 MG tablet TAKE 1 TABLET (5 MG TOTAL) BY MOUTH 3 (THREE) TIMES A WEEK. 08/19/19  Yes Einar Pheasant, MD  valsartan-hydrochlorothiazide (DIOVAN-HCT) 160-12.5 MG  tablet TAKE 1 TABLET BY MOUTH EVERY DAY 12/18/19  Yes Einar Pheasant, MD  benzonatate (TESSALON) 200 MG capsule Take 1 capsule (200 mg total) by mouth 3 (three) times daily as needed for cough. 12/20/19   Thersa Salt G, DO  ipratropium (ATROVENT) 0.06 % nasal spray Place 2 sprays into both nostrils 4 (four) times daily as needed for rhinitis. 12/20/19   Coral Spikes, DO    Family History Family History  Problem Relation Age of Onset  . Stroke Mother   . Stroke Father   . Hypertension Father   . Diabetes Brother   . Multiple sclerosis Brother   . Breast cancer Daughter 87  . Colon cancer Neg Hx     Social History Social History   Tobacco Use  . Smoking status: Never Smoker  . Smokeless tobacco: Never Used  Vaping Use  . Vaping Use: Never used  Substance Use Topics  .  Alcohol use: No    Alcohol/week: 0.0 standard drinks  . Drug use: No     Allergies   Penicillins   Review of Systems Review of Systems Per HPI  Physical Exam Triage Vital Signs ED Triage Vitals  Enc Vitals Group     BP 12/20/19 1241 (!) 123/97     Pulse Rate 12/20/19 1241 84     Resp 12/20/19 1241 19     Temp 12/20/19 1241 98.3 F (36.8 C)     Temp Source 12/20/19 1241 Oral     SpO2 12/20/19 1241 99 %     Weight 12/20/19 1237 182 lb (82.6 kg)     Height 12/20/19 1237 5' 3.5" (1.613 m)     Head Circumference --      Peak Flow --      Pain Score 12/20/19 1237 0     Pain Loc --      Pain Edu? --      Excl. in [place]? --    No data found.  Updated Vital Signs BP (!) 123/97 (BP Location: Left Arm)   Pulse 84   Temp 98.3 F (36.8 C) (Oral)   Resp 19   Ht 5' 3.5" (1.613 m)   Wt 82.6 kg   SpO2 99%   BMI 31.73 kg/m   Visual Acuity Right Eye Distance:   Left Eye Distance:   Bilateral Distance:    Right Eye Near:   Left Eye Near:    Bilateral Near:     Physical Exam Constitutional:      Appearance: Normal appearance. She is not ill-appearing.  HENT:     Head: Normocephalic and  atraumatic.  Eyes:     General:        Right eye: No discharge.        Left eye: No discharge.     Conjunctiva/sclera: Conjunctivae normal.  Cardiovascular:     Rate and Rhythm: Normal rate and regular rhythm.  Pulmonary:     Effort: Pulmonary effort is normal.     Breath sounds: Normal breath sounds. No wheezing, rhonchi or rales.  Neurological:     Mental Status: She is alert.  Psychiatric:        Mood and Affect: Mood normal.        Behavior: Behavior normal.    UC Treatments / Results  Labs (all labs ordered are listed, but only abnormal results are displayed) Labs Reviewed  RESP PANEL BY RT-PCR (FLU A&B, COVID) ARPGX2    EKG   Radiology No results found.  Procedures Procedures (including critical care time)  Medications Ordered in UC Medications - No data to display  Initial Impression / Assessment and Plan / UC Course  I have reviewed the triage vital signs and the nursing notes.  Pertinent labs & imaging results that were available during my care of the patient were reviewed by me and considered in my medical decision making (see chart for details).    86 year old female presents with a viral respiratory infection.  I suspect that this is viral in nature given the presentation and duration of symptoms.  No indications for antibiotic use at this time.  Covid testing and flu testing negative today.  Atrovent nasal spray and Tessalon Perles as prescribed.  Final Clinical Impressions(s) / UC Diagnoses   Final diagnoses:  Viral URI with cough     Discharge Instructions     Testing negative.  Medication as prescribed.  Take care  Dr. Lacinda Axon  ED Prescriptions    Medication Sig Dispense Auth. Provider   ipratropium (ATROVENT) 0.06 % nasal spray Place 2 sprays into both nostrils 4 (four) times daily as needed for rhinitis. 15 mL Keante Urizar G, DO   benzonatate (TESSALON) 200 MG capsule Take 1 capsule (200 mg total) by mouth 3 (three) times daily as  needed for cough. 30 capsule Coral Spikes, DO     PDMP not reviewed this encounter.   Coral Spikes, [place] 12/20/19 1428

## 2019-12-20 NOTE — Discharge Instructions (Signed)
Testing negative.  Medication as prescribed.  Take care  Dr. Lacinda Axon

## 2019-12-23 ENCOUNTER — Other Ambulatory Visit: Payer: Self-pay

## 2019-12-23 ENCOUNTER — Telehealth (INDEPENDENT_AMBULATORY_CARE_PROVIDER_SITE_OTHER): Payer: PPO | Admitting: Internal Medicine

## 2019-12-23 DIAGNOSIS — J019 Acute sinusitis, unspecified: Secondary | ICD-10-CM

## 2019-12-23 DIAGNOSIS — I10 Essential (primary) hypertension: Secondary | ICD-10-CM | POA: Diagnosis not present

## 2019-12-23 DIAGNOSIS — I779 Disorder of arteries and arterioles, unspecified: Secondary | ICD-10-CM | POA: Diagnosis not present

## 2019-12-23 MED ORDER — ALBUTEROL SULFATE HFA 108 (90 BASE) MCG/ACT IN AERS: 2.0000 | INHALATION_SPRAY | Freq: Four times a day (QID) | RESPIRATORY_TRACT | 1 refills | Status: DC | PRN

## 2019-12-23 MED ORDER — DOXYCYCLINE HYCLATE 100 MG PO TABS
100.0000 mg | ORAL_TABLET | Freq: Two times a day (BID) | ORAL | 0 refills | Status: DC
Start: 2019-12-23 — End: 2020-01-27

## 2019-12-23 NOTE — Progress Notes (Signed)
Patient ID: Marissa Huff, female   DOB: 1931-08-23, 86 y.o.   MRN: [id number]   Virtual Visit via telephone Note  This visit type was conducted due to national recommendations for restrictions regarding the COVID-19 pandemic (e.g. social distancing).  This format is felt to be most appropriate for this patient at this time.  All issues noted in this document were discussed and addressed.  No physical exam was performed (except for noted visual exam findings with Video Visits).   I connected with Marissa Huff by telephone and verified that I am speaking with the correct person using two identifiers. Location patient: home Location provider: work  Persons participating in the telephone visit: patient, provider  The limitations, risks, security and privacy concerns of performing an evaluation and management service by telephone and the availability of in person appointments have been discussed.  It has also been discussed with the patient that there may be a patient responsible charge related to this service. The patient expressed understanding and agreed to proceed.   Reason for visit: work in appt.   HPI: She was initially seen 12/20/19 at Urgent Care for work in appt with cough and congestion.  Diagnosed with viral URI.  covid test and flu test negative.  Prescribed atrovent nasal spray and tessalon perles.  Has also been using mucinex and robitussin.  Subsequently developed a sore throat.  No sore throat now.  Some increased nasal congestion - slightly colored mucus.  Increased drainage.  Now with some chest congestion and cough.  Occasional wheezing.  No increased sob.  No fever, nausea or vomiting.  Bowels better  Sugars 200 - this am <200.     ROS: See pertinent positives and negatives per HPI.  Past Medical History:  Diagnosis Date  . Allergy   . Arthritis   . Carpal tunnel syndrome, bilateral   . Diabetes mellitus without complication ([place])   . Diverticulosis   . Gastric lymphoma  ([place])   . Gastroparesis   . GERD (gastroesophageal reflux disease)   . Hypertension   . Hypothyroidism   . PUD (peptic ulcer disease)   . Skin cancer   . Spinal stenosis    lumbar laminectomy    Past Surgical History:  Procedure Laterality Date  . ABDOMINAL HYSTERECTOMY    . antrectomy and vagotomy  1994  . arthroscopic left knee  12/05/08  . BACK SURGERY    . carpal tunnel repai  10/10   left   . CATARACT EXTRACTION, BILATERAL    . EYE SURGERY     bilateral cataracts  . 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 LAPAROSCOPIC CHOLECYSTECTOMY  6/01  . LUMBAR LAMINECTOMY  1999  . LUMBAR LAMINECTOMY WITH COFLEX 1 LEVEL N/A 09/15/2015   Procedure: Lumbar one-two Laminectomy with placement of coflex;  Surgeon: Kristeen Miss, MD;  Location: [facility];  Service: Neurosurgery;  Laterality: N/A;  . PARTIAL HYSTERECTOMY  1960   fibroid tumors, ovaries not removed  . tonsillectomy    . TONSILLECTOMY      Family History  Problem Relation Age of Onset  . Stroke Mother   . Stroke Father   . Hypertension Father   . Diabetes Brother   . Multiple sclerosis Brother   . Breast cancer Daughter 53  . Colon cancer Neg Hx     SOCIAL HX: reviewed.    Current Outpatient Medications:  .  albuterol (VENTOLIN HFA) 108 (90 [place]) MCG/ACT inhaler, Inhale 2 puffs into the  lungs every 6 (six) hours as needed for wheezing or shortness of breath., Disp: 1 each, Rfl: 1 .  amLODipine (NORVASC) 5 MG tablet, TAKE 1 TABLET BY MOUTH EVERY DAY, Disp: 90 tablet, Rfl: 1 .  aspirin 81 MG tablet, Take 81 mg by mouth daily., Disp: , Rfl:  .  BD PEN NEEDLE NANO U/F 32G X 4 MM MISC, USE 1 EACH 4 (FOUR) TIMES DAILY, Disp: , Rfl:  .  benzonatate (TESSALON) 200 MG capsule, Take 1 capsule (200 mg total) by mouth 3 (three) times daily as needed for cough., Disp: 30 capsule, Rfl: 0 .  Calcium Citrate-Vitamin D (CALCIUM + D PO), Take 1 tablet by mouth daily., Disp: , Rfl:  .  Continuous Blood Gluc Receiver (FREESTYLE LIBRE  14 DAY READER) DEVI, Use 1 each as needed .  Use to test blood sugar, Disp: , Rfl:  .  Continuous Blood Gluc Sensor (FREESTYLE LIBRE 14 DAY SENSOR) MISC, Use 1 each every 14 (fourteen) days, Disp: , Rfl:  .  doxycycline (VIBRA-TABS) 100 MG tablet, Take 1 tablet (100 mg total) by mouth 2 (two) times daily., Disp: 20 tablet, Rfl: 0 .  fexofenadine (ALLEGRA) 180 MG tablet, Take 180 mg by mouth daily., Disp: , Rfl:  .  fluticasone (FLONASE) 50 MCG/ACT nasal spray, SPRAY 2 SPRAYS INTO EACH NOSTRIL EVERY DAY, Disp: 48 mL, Rfl: 3 .  glucagon (GLUCAGON EMERGENCY) 1 MG injection, Inject 1 mg into the muscle once as needed., Disp: 1 each, Rfl: 3 .  glucose blood test strip, , Disp: , Rfl:  .  Insulin Aspart Prot & Aspart (NOVOLOG MIX 70/30 [place]), Inject 11 Units into the skin 3 (three) times daily with meals., Disp: , Rfl:  .  ipratropium (ATROVENT) 0.06 % nasal spray, Place 2 sprays into both nostrils 4 (four) times daily as needed for rhinitis., Disp: 15 mL, Rfl: 0 .  LANTUS SOLOSTAR 100 UNIT/ML Solostar Pen, INJECT 24UNITS AT BEDTIME, Disp: , Rfl: 4 .  levothyroxine (SYNTHROID) 150 MCG tablet, TAKE 1 TABLET BY MOUTH EVERY DAY, Disp: 90 tablet, Rfl: 3 .  LORazepam (ATIVAN) 0.5 MG tablet, Take 0.5-1 tablets (0.25-0.5 mg total) by mouth daily as needed for anxiety., Disp: 30 tablet, Rfl: 0 .  metFORMIN (GLUCOPHAGE-XR) 500 MG 24 hr tablet, TAKE 1 TABLET BY MOUTH TWICE A DAY, Disp: 180 tablet, Rfl: 1 .  Multiple Vitamins-Minerals (MULTIVITAMIN PO), Take 1 tablet by mouth daily. , Disp: , Rfl:  .  Multiple Vitamins-Minerals (PRESERVISION AREDS PO), Take 1 capsule by mouth 2 (two) times daily., Disp: , Rfl:  .  pantoprazole (PROTONIX) 40 MG tablet, TAKE 1 TABLET BY MOUTH TWICE A DAY, Disp: 180 tablet, Rfl: 1 .  rosuvastatin (CRESTOR) 5 MG tablet, TAKE 1 TABLET (5 MG TOTAL) BY MOUTH 3 (THREE) TIMES A WEEK., Disp: 40 tablet, Rfl: 1 .  valsartan-hydrochlorothiazide (DIOVAN-HCT) 160-12.5 MG tablet, TAKE 1 TABLET BY  MOUTH EVERY DAY, Disp: 90 tablet, Rfl: 1  EXAM:  GENERAL: alert.  Sounds to be in no acute distress.  Answering questions appropriately.   PSYCH/NEURO: pleasant and cooperative, no obvious depression or anxiety, speech and thought processing grossly intact  ASSESSMENT AND PLAN:  Discussed the following assessment and plan:  Problem List Items Addressed This Visit    Hypertension    Blood pressure has been doing well.  On valsartan/hctz and amlodipine.  Follow pressures.  Follow metabolic panel.       Carotid artery disease, unspecified laterality ([place])  Has seen [facility].  Last evaluated 01/2019.  Stable.  Recommended f/u in one year.       Sinusitis    Sinusitis/URI.  Continue mucinex/robitussin.  flonase as directed.  Doxycycline 100mg  bid.  Probiotic as directed.  Albuterol inhaler if needed.  Follow.  Call with update.        Relevant Medications   doxycycline (VIBRA-TABS) 100 MG tablet       I discussed the assessment and treatment plan with the patient. The patient was provided an opportunity to ask questions and all were answered. The patient agreed with the plan and demonstrated an understanding of the instructions.   The patient was advised to call back or seek an in-person evaluation if the symptoms worsen or if the condition fails to improve as anticipated.  I provided 23 minutes of non-face-to-face time during this encounter.   Einar Pheasant, MD

## 2019-12-24 DIAGNOSIS — J301 Allergic rhinitis due to pollen: Secondary | ICD-10-CM | POA: Diagnosis not present

## 2019-12-30 ENCOUNTER — Encounter: Payer: Self-pay | Admitting: Internal Medicine

## 2019-12-30 DIAGNOSIS — J329 Chronic sinusitis, unspecified: Secondary | ICD-10-CM | POA: Insufficient documentation

## 2019-12-30 NOTE — Assessment & Plan Note (Signed)
Blood pressure has been doing well.  On valsartan/hctz and amlodipine.  Follow pressures.  Follow metabolic panel.

## 2019-12-30 NOTE — Assessment & Plan Note (Signed)
Has seen [facility].  Last evaluated 01/2019.  Stable.  Recommended f/u in one year.

## 2019-12-30 NOTE — Addendum Note (Signed)
Addended by: Charm Barges on: 12/30/2019 05:22 PM   Modules accepted: Level of Service

## 2019-12-30 NOTE — Assessment & Plan Note (Signed)
Sinusitis/URI.  Continue mucinex/robitussin.  flonase as directed.  Doxycycline 100mg  bid.  Probiotic as directed.  Albuterol inhaler if needed.  Follow.  Call with update.

## 2020-01-11 ENCOUNTER — Other Ambulatory Visit: Payer: Self-pay | Admitting: Family Medicine

## 2020-01-13 DIAGNOSIS — J301 Allergic rhinitis due to pollen: Secondary | ICD-10-CM | POA: Diagnosis not present

## 2020-01-22 ENCOUNTER — Ambulatory Visit: Payer: PPO

## 2020-01-22 ENCOUNTER — Telehealth: Payer: Self-pay

## 2020-01-22 NOTE — Telephone Encounter (Signed)
Unable to reach patient for scheduled awv. No answer. Voice mail full. Reschedule appointment.

## 2020-01-23 ENCOUNTER — Other Ambulatory Visit (INDEPENDENT_AMBULATORY_CARE_PROVIDER_SITE_OTHER): Payer: PPO

## 2020-01-23 ENCOUNTER — Other Ambulatory Visit: Payer: Self-pay

## 2020-01-23 DIAGNOSIS — E7800 Pure hypercholesterolemia, unspecified: Secondary | ICD-10-CM

## 2020-01-23 DIAGNOSIS — E1065 Type 1 diabetes mellitus with hyperglycemia: Secondary | ICD-10-CM

## 2020-01-23 LAB — BASIC METABOLIC PANEL
BUN: 22 mg/dL (ref 6–23)
CO2: 31 mEq/L (ref 19–32)
Calcium: 10 mg/dL (ref 8.4–10.5)
Chloride: 103 mEq/L (ref 96–112)
Creatinine, Ser: 0.84 mg/dL (ref 0.40–1.20)
GFR: 62.72 mL/min (ref >60.00)
Glucose, Bld: 257 mg/dL — ABNORMAL HIGH (ref 70–99)
Potassium: 4 mEq/L (ref 3.5–5.1)
Sodium: 139 mEq/L (ref 135–145)

## 2020-01-23 LAB — LIPID PANEL
Cholesterol: 106 mg/dL (ref 0–200)
HDL: 44.2 mg/dL (ref >39.00)
LDL Cholesterol: 41 mg/dL (ref 0–99)
NonHDL: 61.45
Total CHOL/HDL Ratio: 2
Triglycerides: 103 mg/dL (ref 0.0–149.0)
VLDL: 20.6 mg/dL (ref 0.0–40.0)

## 2020-01-23 LAB — HEPATIC FUNCTION PANEL
ALT: 15 U/L (ref 0–35)
AST: 14 U/L (ref 0–37)
Albumin: 4.2 g/dL (ref 3.5–5.2)
Alkaline Phosphatase: 74 U/L (ref 39–117)
Bilirubin, Direct: 0.1 mg/dL (ref 0.0–0.3)
Total Bilirubin: 0.5 mg/dL (ref 0.2–1.2)
Total Protein: 7 g/dL (ref 6.0–8.3)

## 2020-01-23 LAB — HEMOGLOBIN A1C: Hgb A1c MFr Bld: 8.3 % — ABNORMAL HIGH (ref 4.6–6.5)

## 2020-01-24 ENCOUNTER — Ambulatory Visit (INDEPENDENT_AMBULATORY_CARE_PROVIDER_SITE_OTHER): Payer: PPO | Admitting: Nurse Practitioner

## 2020-01-24 ENCOUNTER — Encounter (INDEPENDENT_AMBULATORY_CARE_PROVIDER_SITE_OTHER): Payer: PPO

## 2020-01-27 ENCOUNTER — Telehealth (INDEPENDENT_AMBULATORY_CARE_PROVIDER_SITE_OTHER): Payer: PPO | Admitting: Internal Medicine

## 2020-01-27 ENCOUNTER — Encounter: Payer: Self-pay | Admitting: Internal Medicine

## 2020-01-27 DIAGNOSIS — E038 Other specified hypothyroidism: Secondary | ICD-10-CM

## 2020-01-27 DIAGNOSIS — I779 Disorder of arteries and arterioles, unspecified: Secondary | ICD-10-CM

## 2020-01-27 DIAGNOSIS — J301 Allergic rhinitis due to pollen: Secondary | ICD-10-CM | POA: Diagnosis not present

## 2020-01-27 DIAGNOSIS — K219 Gastro-esophageal reflux disease without esophagitis: Secondary | ICD-10-CM

## 2020-01-27 DIAGNOSIS — D649 Anemia, unspecified: Secondary | ICD-10-CM

## 2020-01-27 DIAGNOSIS — M4800 Spinal stenosis, site unspecified: Secondary | ICD-10-CM | POA: Diagnosis not present

## 2020-01-27 DIAGNOSIS — E1065 Type 1 diabetes mellitus with hyperglycemia: Secondary | ICD-10-CM | POA: Diagnosis not present

## 2020-01-27 DIAGNOSIS — E7800 Pure hypercholesterolemia, unspecified: Secondary | ICD-10-CM | POA: Diagnosis not present

## 2020-01-27 DIAGNOSIS — I10 Essential (primary) hypertension: Secondary | ICD-10-CM | POA: Diagnosis not present

## 2020-01-27 DIAGNOSIS — Z9109 Other allergy status, other than to drugs and biological substances: Secondary | ICD-10-CM

## 2020-01-27 NOTE — Progress Notes (Signed)
Patient ID: Marissa Huff, female   DOB: 1931-10-11, 87 y.o.   MRN: [id number]   Virtual Visit via telephone Note  This visit type was conducted due to national recommendations for restrictions regarding the COVID-19 pandemic (e.g. social distancing).  This format is felt to be most appropriate for this patient at this time.  All issues noted in this document were discussed and addressed.  No physical exam was performed (except for noted visual exam findings with Video Visits).   I connected with Marissa Huff by telephone and verified that I am speaking with the correct person using two identifiers. Location patient: home Location provider: work Persons participating in the telephone visit: patient, provider  The limitations, risks, security and privacy concerns of performing an evaluation and management service by telephone and the availability of in person appointments have been discussed.  It has also been discussed with the patient that there may be a patient responsible charge related to this service. The patient expressed understanding and agreed to proceed.   Reason for visit: scheduled follow up.   HPI: Seen at Urgent Care 12/20/19 for cough and congestion.  covid and flu test negative.  I saw her 12/23/19 - persistent cough and congestion.  Treated with doxycycline.  Instructed to use inhaler.  She is much better now.  Feels better.  Breathing better. Feels getting back to her baseline. No increased cough.  Eating.  Trying to watch her diet.  Starting to be more active and getting back into her exercise.  No chest pain reported.  No abdominal pain.  Bowels better.  Off tramadol.  Takes tylenol prn.  Sees Dr Honor Junes for f/u of her diabetes.  Uses SSI.   ROS: See pertinent positives and negatives per HPI.  Past Medical History:  Diagnosis Date  . Allergy   . Arthritis   . Carpal tunnel syndrome, bilateral   . Diabetes mellitus without complication ([place])   . Diverticulosis   .  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 Gastroparesis   . GERD (gastroesophageal reflux disease)   . Hypertension   . Hypothyroidism   . PUD (peptic ulcer disease)   . Skin cancer   . Spinal stenosis    lumbar laminectomy    Past Surgical History:  Procedure Laterality Date  . ABDOMINAL HYSTERECTOMY    . antrectomy and vagotomy  1994  . arthroscopic left knee  12/05/08  . BACK SURGERY    . carpal tunnel repai  10/10   left   . CATARACT EXTRACTION, BILATERAL    . EYE SURGERY     bilateral cataracts  . 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 LAPAROSCOPIC CHOLECYSTECTOMY  6/01  . LUMBAR LAMINECTOMY  1999  . LUMBAR LAMINECTOMY WITH COFLEX 1 LEVEL N/A 09/15/2015   Procedure: Lumbar one-two Laminectomy with placement of coflex;  Surgeon: Kristeen Miss, MD;  Location: [facility];  Service: Neurosurgery;  Laterality: N/A;  . PARTIAL HYSTERECTOMY  1960   fibroid tumors, ovaries not removed  . tonsillectomy    . TONSILLECTOMY      Family History  Problem Relation Age of Onset  . Stroke Mother   . Stroke Father   . Hypertension Father   . Diabetes Brother   . Multiple sclerosis Brother   . Breast cancer Daughter 50  . Colon cancer Neg Hx     SOCIAL HX: reviewed.    Current Outpatient Medications:  .  albuterol (VENTOLIN HFA) 108 (90 Base) MCG/ACT inhaler,  Inhale 2 puffs into the lungs every 6 (six) hours as needed for wheezing or shortness of breath., Disp: 1 each, Rfl: 1 .  amLODipine (NORVASC) 5 MG tablet, TAKE 1 TABLET BY MOUTH EVERY DAY, Disp: 90 tablet, Rfl: 1 .  aspirin 81 MG tablet, Take 81 mg by mouth daily., Disp: , Rfl:  .  BD PEN NEEDLE NANO U/F 32G X 4 MM MISC, USE 1 EACH 4 (FOUR) TIMES DAILY, Disp: , Rfl:  .  Calcium Citrate-Vitamin D (CALCIUM + D PO), Take 1 tablet by mouth daily., Disp: , Rfl:  .  Continuous Blood Gluc Receiver (FREESTYLE LIBRE 14 DAY READER) DEVI, Use 1 each as needed .  Use to test blood sugar, Disp: , Rfl:  .  Continuous Blood Gluc Sensor (FREESTYLE LIBRE  14 DAY SENSOR) MISC, Use 1 each every 14 (fourteen) days, Disp: , Rfl:  .  fexofenadine (ALLEGRA) 180 MG tablet, Take 180 mg by mouth daily., Disp: , Rfl:  .  fluticasone (FLONASE) 50 MCG/ACT nasal spray, SPRAY 2 SPRAYS INTO EACH NOSTRIL EVERY DAY, Disp: 48 mL, Rfl: 3 .  glucagon (GLUCAGON EMERGENCY) 1 MG injection, Inject 1 mg into the muscle once as needed., Disp: 1 each, Rfl: 3 .  glucose blood test strip, , Disp: , Rfl:  .  Insulin Aspart Prot & Aspart (NOVOLOG MIX 70/30 [place]), Inject 11 Units into the skin 3 (three) times daily with meals., Disp: , Rfl:  .  ipratropium (ATROVENT) 0.06 % nasal spray, PLACE 2 SPRAYS INTO BOTH NOSTRILS 4 (FOUR) TIMES DAILY AS NEEDED FOR RHINITIS., Disp: 15 mL, Rfl: 0 .  LANTUS SOLOSTAR 100 UNIT/ML Solostar Pen, INJECT 24UNITS AT BEDTIME, Disp: , Rfl: 4 .  levothyroxine (SYNTHROID) 150 MCG tablet, TAKE 1 TABLET BY MOUTH EVERY DAY, Disp: 90 tablet, Rfl: 3 .  LORazepam (ATIVAN) 0.5 MG tablet, Take 0.5-1 tablets (0.25-0.5 mg total) by mouth daily as needed for anxiety., Disp: 30 tablet, Rfl: 0 .  metFORMIN (GLUCOPHAGE-XR) 500 MG 24 hr tablet, TAKE 1 TABLET BY MOUTH TWICE A DAY, Disp: 180 tablet, Rfl: 1 .  Multiple Vitamins-Minerals (MULTIVITAMIN PO), Take 1 tablet by mouth daily. , Disp: , Rfl:  .  Multiple Vitamins-Minerals (PRESERVISION AREDS PO), Take 1 capsule by mouth 2 (two) times daily., Disp: , Rfl:  .  pantoprazole (PROTONIX) 40 MG tablet, TAKE 1 TABLET BY MOUTH TWICE A DAY, Disp: 180 tablet, Rfl: 1 .  rosuvastatin (CRESTOR) 5 MG tablet, TAKE 1 TABLET (5 MG TOTAL) BY MOUTH 3 (THREE) TIMES A WEEK., Disp: 40 tablet, Rfl: 1 .  valsartan-hydrochlorothiazide (DIOVAN-HCT) 160-12.5 MG tablet, TAKE 1 TABLET BY MOUTH EVERY DAY, Disp: 90 tablet, Rfl: 1  EXAM:  GENERAL: alert. Sounds to be in no acute distress.  Answering questions appropriately.   PSYCH/NEURO: pleasant and cooperative, no obvious depression or anxiety, speech and thought processing grossly  intact  ASSESSMENT AND PLAN:  Discussed the following assessment and plan:  Problem List Items Addressed This Visit    Anemia    Follow cbc.       Carotid artery disease, unspecified laterality ([place])    Has seen [facility]. Last evaluated 01/2019.  Stable.  Recommended f/u in one year.  Due f/u.       Environmental allergies    Has been followed by Dr Kathyrn Sheriff.  Allergy injections.  Allegra and steroid nasal spray.  Stable.       GERD (gastroesophageal reflux disease)    Upper symptoms controlled. On protonix.  Hypercholesterolemia    On crestor.  Low cholesterol diet and exercise.  Follow lipid panel and liver function tests.       Relevant Orders   TSH   Lipid panel   Hepatic function panel   Hypertension    On valsartan/hctz and amlodipine.  Blood pressure has been doing well.  Follow pressures.  Follow metabolic panel.       Hypothyroidism    On thyroid replacement.  Follow tsh.       Spinal stenosis    S/p surgery.  Doing better.  Off tramadol.  Uses tylenol.  Follow.        Type 1 diabetes mellitus with hyperglycemia (H[facility])    Sugars in am averaging 160-200.  Uses SSI.  Sees Dr Honor Junes.  Discussed low carb diet and exercise.  Follow met b and a1c.       Relevant Orders   Hemoglobin E0V   Basic metabolic panel   Microalbumin / creatinine urine ratio       I discussed the assessment and treatment plan with the patient. The patient was provided an opportunity to ask questions and all were answered. The patient agreed with the plan and demonstrated an understanding of the instructions.   The patient was advised to call back or seek an in-person evaluation if the symptoms worsen or if the condition fails to improve as anticipated.  I provided 23 minutes of non-face-to-face time during this encounter.   Einar Pheasant, MD

## 2020-01-28 ENCOUNTER — Ambulatory Visit (INDEPENDENT_AMBULATORY_CARE_PROVIDER_SITE_OTHER): Payer: PPO

## 2020-01-28 VITALS — Ht 63.0 in | Wt 182.0 lb

## 2020-01-28 DIAGNOSIS — Z0000 Encounter for general adult medical examination without abnormal findings: Secondary | ICD-10-CM

## 2020-01-28 NOTE — Progress Notes (Signed)
Subjective:   Marissa Huff is a 87 y.o. female who presents for Medicare Annual (Subsequent) preventive examination.  Review of Systems    No ROS.  Medicare Wellness Virtual Visit.    Cardiac Risk Factors include: advanced age (>45men, >90 women);diabetes mellitus;hypertension     Objective:    Today's Vitals   01/28/20 1036  Weight: 182 lb (82.6 kg)  Height: 5\' 3"  (1.6 m)   Body mass index is 32.24 kg/m.  Advanced Directives 01/28/2020 12/20/2019 01/21/2019 01/19/2018 01/18/2017 12/09/2015 09/09/2015  Does Patient Have a Medical Advance Directive? No No No No No No No  Does patient want to make changes to medical advance directive? - - - No - Patient declined - - -  Would patient like information on creating a medical advance directive? No - Patient declined - No - Patient declined - No - Patient declined No - Patient declined No - patient declined information    Current Medications (verified) Outpatient Encounter Medications as of 01/28/2020  Medication Sig  . albuterol (VENTOLIN HFA) 108 (90 Base) MCG/ACT inhaler Inhale 2 puffs into the lungs every 6 (six) hours as needed for wheezing or shortness of breath.  Marland Kitchen amLODipine (NORVASC) 5 MG tablet TAKE 1 TABLET BY MOUTH EVERY DAY  . aspirin 81 MG tablet Take 81 mg by mouth daily.  . BD PEN NEEDLE NANO U/F 32G X 4 MM MISC USE 1 EACH 4 (FOUR) TIMES DAILY  . Calcium Citrate-Vitamin D (CALCIUM + D PO) Take 1 tablet by mouth daily.  . Continuous Blood Gluc Receiver (FREESTYLE LIBRE 14 DAY READER) DEVI Use 1 each as needed .  Use to test blood sugar  . Continuous Blood Gluc Sensor (FREESTYLE LIBRE 14 DAY SENSOR) MISC Use 1 each every 14 (fourteen) days  . fexofenadine (ALLEGRA) 180 MG tablet Take 180 mg by mouth daily.  . fluticasone (FLONASE) 50 MCG/ACT nasal spray SPRAY 2 SPRAYS INTO EACH NOSTRIL EVERY DAY  . glucagon (GLUCAGON EMERGENCY) 1 MG injection Inject 1 mg into the muscle once as needed.  Marland Kitchen glucose blood test strip   .  Insulin Aspart Prot & Aspart (NOVOLOG MIX 70/30 [place]) Inject 11 Units into the skin 3 (three) times daily with meals.  Marland Kitchen ipratropium (ATROVENT) 0.06 % nasal spray PLACE 2 SPRAYS INTO BOTH NOSTRILS 4 (FOUR) TIMES DAILY AS NEEDED FOR RHINITIS.  Marland Kitchen LANTUS SOLOSTAR 100 UNIT/ML Solostar Pen INJECT 24UNITS AT BEDTIME  . levothyroxine (SYNTHROID) 150 MCG tablet TAKE 1 TABLET BY MOUTH EVERY DAY  . LORazepam (ATIVAN) 0.5 MG tablet Take 0.5-1 tablets (0.25-0.5 mg total) by mouth daily as needed for anxiety.  . metFORMIN (GLUCOPHAGE-XR) 500 MG 24 hr tablet TAKE 1 TABLET BY MOUTH TWICE A DAY  . Multiple Vitamins-Minerals (MULTIVITAMIN PO) Take 1 tablet by mouth daily.   . Multiple Vitamins-Minerals (PRESERVISION AREDS PO) Take 1 capsule by mouth 2 (two) times daily.  . pantoprazole (PROTONIX) 40 MG tablet TAKE 1 TABLET BY MOUTH TWICE A DAY  . rosuvastatin (CRESTOR) 5 MG tablet TAKE 1 TABLET (5 MG TOTAL) BY MOUTH 3 (THREE) TIMES A WEEK.  . valsartan-hydrochlorothiazide (DIOVAN-HCT) 160-12.5 MG tablet TAKE 1 TABLET BY MOUTH EVERY DAY   No facility-administered encounter medications on file as of 01/28/2020.    Allergies (verified) Penicillins   History: Past Medical History:  Diagnosis Date  . Allergy   . Arthritis   . Carpal tunnel syndrome, bilateral   . Diabetes mellitus without complication ([place])   . Diverticulosis   .  Gastric lymphoma ([facility])   . Gastroparesis   . GERD (gastroesophageal reflux disease)   . Hypertension   . Hypothyroidism   . PUD (peptic ulcer disease)   . Skin cancer   . Spinal stenosis    lumbar laminectomy   Past Surgical History:  Procedure Laterality Date  . ABDOMINAL HYSTERECTOMY    . antrectomy and vagotomy  1994  . arthroscopic left knee  12/05/08  . BACK SURGERY    . carpal tunnel repai  10/10   left   . CATARACT EXTRACTION, BILATERAL    . EYE SURGERY     bilateral cataracts  . 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 LAPAROSCOPIC CHOLECYSTECTOMY  6/01  . LUMBAR  LAMINECTOMY  1999  . LUMBAR LAMINECTOMY WITH COFLEX 1 LEVEL N/A 09/15/2015   Procedure: Lumbar one-two Laminectomy with placement of coflex;  Surgeon: Kristeen Miss, MD;  Location: [facility];  Service: Neurosurgery;  Laterality: N/A;  . PARTIAL HYSTERECTOMY  1960   fibroid tumors, ovaries not removed  . tonsillectomy    . TONSILLECTOMY     Family History  Problem Relation Age of Onset  . Stroke Mother   . Stroke Father   . Hypertension Father   . Diabetes Brother   . Multiple sclerosis Brother   . Breast cancer Daughter 59  . Colon cancer Neg Hx    Social History   Socioeconomic History  . Marital status: Married    Spouse name: Not on file  . Number of children: Not on file  . Years of education: Not on file  . Highest education level: Not on file  Occupational History  . Not on file  Tobacco Use  . Smoking status: Never Smoker  . Smokeless tobacco: Never Used  Vaping Use  . Vaping Use: Never used  Substance and Sexual Activity  . Alcohol use: No    Alcohol/week: 0.0 standard drinks  . Drug use: No  . Sexual activity: Not Currently    Birth control/protection: None, Post-menopausal  Other Topics Concern  . Not on file  Social History Narrative  . Not on file   Social Determinants of Health   Financial Resource Strain: Low Risk   . Difficulty of Paying Living Expenses: Not hard at all  Food Insecurity: No Food Insecurity  . Worried About Charity fundraiser in the Last Year: Never true  . Ran Out of Food in the Last Year: Never true  Transportation Needs: No Transportation Needs  . Lack of Transportation (Medical): No  . Lack of Transportation (Non-Medical): No  Physical Activity: Not on file  Stress: No Stress Concern Present  . Feeling of Stress : Not at all  Social Connections: Socially Integrated  . Frequency of Communication with Friends and Family: More than three times a week  . Frequency of Social Gatherings with Friends and Family: More than three  times a week  . Attends Religious Services: More than 4 times per year  . Active Member of Clubs or Organizations: Yes  . Attends Archivist Meetings: Not on file  . Marital Status: Married    Tobacco Counseling Counseling given: Not Answered   Clinical Intake:  Pre-visit preparation completed: Yes        Diabetes: Yes (Followed by Endocrinology)  How often do you need to have someone help you when you read instructions, pamphlets, or other written materials from your doctor or pharmacy?: 1 - Never  Nutrition Risk Assessment: Has  the patient had any N/V/D within the last 2 months?  No  Does the patient have any non-healing wounds?  No  Has the patient had any unintentional weight loss or weight gain?  No   Diabetes:  If diabetic, was a CBG obtained today?  Yes , 127  Did the patient bring in their glucometer from home?  No  How often do you monitor your CBG's? 4 times daily.   Financial Strains and Diabetes Management: Are you having any financial strains with the device, your supplies or your medication? No .  Does the patient need to be seen by Chronic Care Management for management of their diabetes?  No  Would the patient like to be referred to a Nutritionist or for Diabetic Management?  No   Diabetic Exams: Diabetic Eye Exam: Completed 09/18/19. Visits every 6 months.  Diabetic Foot Exam: Denies wounds or changes in feet.   Diabetes managed by Endocrinology, Dr. Honor Junes.   Interpreter Needed?: No      Activities of Daily Living In your present state of health, do you have any difficulty performing the following activities: 01/28/2020  Hearing? Y  Comment Hearing aids  Vision? N  Difficulty concentrating or making decisions? N  Walking or climbing stairs? N  Dressing or bathing? N  Doing errands, shopping? Y  Preparing Food and eating ? Y  Comment Daughter meal prep as needed. Self feeds.  Using the Toilet? N  In the past six months, have you  accidently leaked urine? N  Do you have problems with loss of bowel control? N  Managing your Medications? N  Managing your Finances? N  Comment Daughter assist as needed  Housekeeping or managing your Housekeeping? Y  Comment Daughter assist  Some recent data might be hidden    Patient Care Team: Einar Pheasant, MD as PCP - General (Internal Medicine)  Indicate any recent Medical Services you may have received from other than [facility] providers in the past year (date may be approximate).     Assessment:   This is a routine wellness examination for Anyla.  I connected with Akeila today by telephone and verified that I am speaking with the correct person using two identifiers. Location patient: home Location provider: work Persons participating in the virtual visit: patient, Marine scientist.    I discussed the limitations, risks, security and privacy concerns of performing an evaluation and management service by telephone and the availability of in person appointments. The patient expressed understanding and verbally consented to this telephonic visit.    Interactive audio and video telecommunications were attempted between this provider and patient, however failed, due to patient having technical difficulties OR patient did not have access to video capability.  We continued and completed visit with audio only.  Some vital signs may be absent or patient reported.   Hearing/Vision screen  Hearing Screening   125Hz  250Hz  500Hz  1000Hz  2000Hz  3000Hz  4000Hz  6000Hz  8000Hz   Right ear:           Left ear:           Comments: Followed by Carita Pian  Visits as needed  Hearing aid, bilateral  Vision Screening Comments: Followed by [facility]  Wears corrective lenses  Annual visits  Cataract extraction, bilateral  Virtual visit  Dietary issues and exercise activities discussed: Current Exercise Habits: The patient does not participate in regular exercise at present  Healthy diet Good  water intake  Encouraged to stay active.   Goals    .  Follow up with Primary Care Provider     As needed      Depression Screen [facility] 2/9 Scores 01/28/2020 01/27/2020 01/21/2019 01/19/2018 01/18/2017 06/07/2016 12/09/2015  PHQ - 2 Score 0 0 0 0 0 0 0  PHQ- 9 Score - - - - 0 - -    Fall Risk Fall Risk  01/28/2020 01/21/2019 01/19/2018 01/18/2017 06/07/2016  Falls in the past year? 0 0 0 No No  Number falls in past yr: 0 - - - -  Injury with Fall? 0 - - - -  Comment - - - - -  Risk Factor Category  - - - - -  Follow up Falls evaluation completed Falls evaluation completed - - -    FALL RISK PREVENTION PERTAINING TO THE HOME: Handrails in use when climbing stairs? Yes Home free of loose throw rugs in walkways, pet beds, electrical cords, etc? Yes  Adequate lighting in your home to reduce risk of falls? Yes   ASSISTIVE DEVICES UTILIZED TO PREVENT FALLS: Life alert? No  Use of a cane, walker or w/c? No  Grab bars in the bathroom? Yes  Shower chair or bench in shower? Yes   Elevated toilet seat/chair height? Yes    TIMED UP AND GO: Was the test performed? No . Virtual visit.   Cognitive Function: Patient is alert and oriented x3.  Enjoys reading.  MMSE/6CIT declined.  Notes no difficulty focusing or making decisions.  MMSE - Mini Mental State Exam 01/18/2017 12/09/2015 12/09/2014  Orientation to time 5 5 5   Orientation to Place 5 5 5   Registration 3 3 3   Attention/ Calculation 4 5 5   Recall 3 3 3   Language- name 2 objects 2 2 2   Language- repeat 1 1 1   Language- follow 3 step command 3 3 3   Language- read & follow direction 1 1 1   Write a sentence 1 1 1   Copy design 1 1 1   Total score 29 30 30      6CIT Screen 01/21/2019 01/19/2018  What Year? 0 points 0 points  What month? 0 points 0 points  What time? 0 points 0 points  Count back from 20 0 points 0 points  Months in reverse 0 points 0 points  Repeat phrase - 0 points  Total Score - 0    Immunizations Immunization History   Administered Date(s) Administered  . Fluad Quad(high Dose 65+) 09/25/2019  . Influenza Split 12/12/2011, 10/01/2012  . Influenza, High Dose Seasonal PF 10/02/2015, 09/08/2016, 09/19/2017, 09/26/2018  . Influenza,inj,Quad PF,6+ Mos 09/03/2013, 10/27/2014  . PFIZER(Purple Top)SARS-COV-2 Vaccination 01/29/2019, 02/19/2019, 10/24/2019  . Pneumococcal Conjugate-13 11/11/2014  . Pneumococcal Polysaccharide-23 12/09/2015   TDAP status: Due, Education has been provided regarding the importance of this vaccine. Advised may receive this vaccine at local pharmacy or Health Dept. Aware to provide a copy of the vaccination record if obtained from local pharmacy or Health Dept. Verbalized acceptance and understanding. Deferred.   Health Maintenance Health Maintenance  Topic Date Due  . FOOT EXAM  01/27/2021 (Originally 05/06/2018)  . TETANUS/TDAP  01/27/2021 (Originally 03/09/1950)  . COVID-19 Vaccine (4 - Booster for Pfizer series) 04/23/2020  . MAMMOGRAM  06/04/2020  . HEMOGLOBIN A1C  07/22/2020  . OPHTHALMOLOGY EXAM  09/17/2020  . INFLUENZA VACCINE  Completed  . DEXA SCAN  Completed  . PNA vac Low Risk Adult  Completed   Colorectal cancer screening: No longer required.   Mammogram status: Completed 06/05/19. Repeat every year. MM 3D SCREEN BREAST  BILATERAL.  Bone density- 01/26/15. Calcium+D.   Lung Cancer Screening: (Low Dose CT Chest recommended if Age 52-80 years, 30 pack-year currently smoking OR have quit w/in 15years.) does not qualify.   Hepatitis C Screening: does not qualify.  Vision Screening: Recommended annual ophthalmology exams for early detection of glaucoma and other disorders of the eye. Is the patient up to date with their annual eye exam?  Yes   Dental Screening: Recommended annual dental exams for proper oral hygiene  Community Resource Referral / Chronic Care Management: CRR required this visit?  No   CCM required this visit?  No      Plan:   Keep all routine  maintenance appointments.   Next scheduled lab 06/02/20 @ 9:00  Follow up 06/04/20 @ 11:30  I have personally reviewed and noted the following in the patient's chart:   . Medical and social history . Use of alcohol, tobacco or illicit drugs  . Current medications and supplements . Functional ability and status . Nutritional status . Physical activity . Advanced directives . List of other physicians . Hospitalizations, surgeries, and ER visits in previous 12 months . Vitals . Screenings to include cognitive, depression, and falls . Referrals and appointments  In addition, I have reviewed and discussed with patient certain preventive protocols, quality metrics, and best practice recommendations. A written personalized care plan for preventive services as well as general preventive health recommendations were provided to patient via mychart.     Varney Biles, LPN   [id number]

## 2020-01-28 NOTE — Patient Instructions (Addendum)
Marissa Huff , Thank you for taking time to come for your Medicare Wellness Visit. I appreciate your ongoing commitment to your health goals. Please review the following plan we discussed and let me know if I can assist you in the future.   These are the goals we discussed: Goals    . Follow up with Primary Care Provider     As needed       This is a list of the screening recommended for you and due dates:  Health Maintenance  Topic Date Due  . Complete foot exam   01/27/2021*  . Tetanus Vaccine  01/27/2021*  . COVID-19 Vaccine (4 - Booster for Pfizer series) 04/23/2020  . Mammogram  06/04/2020  . Hemoglobin A1C  07/22/2020  . Eye exam for diabetics  09/17/2020  . Flu Shot  Completed  . DEXA scan (bone density measurement)  Completed  . Pneumonia vaccines  Completed  *Topic was postponed. The date shown is not the original due date.    Immunizations Immunization History  Administered Date(s) Administered  . Fluad Quad(high Dose 65+) 09/25/2019  . Influenza Split 12/12/2011, 10/01/2012  . Influenza, High Dose Seasonal PF 10/02/2015, 09/08/2016, 09/19/2017, 09/26/2018  . Influenza,inj,Quad PF,6+ Mos 09/03/2013, 10/27/2014  . PFIZER(Purple Top)SARS-COV-2 Vaccination 01/29/2019, 02/19/2019, 10/24/2019  . Pneumococcal Conjugate-13 11/11/2014  . Pneumococcal Polysaccharide-23 12/09/2015   Keep all routine maintenance appointments.   Next scheduled lab 06/02/20 @ 9:00  Follow up 06/04/20 @ 11:30  Advanced directives: not yet completed  Conditions/risks identified: none new  Follow up in one year for your annual wellness visit    Preventive Care 65 Years and Older, Female Preventive care refers to lifestyle choices and visits with your health care provider that can promote health and wellness. What does preventive care include?  A yearly physical exam. This is also called an annual well check.  Dental exams once or twice a year.  Routine eye exams. Ask your health care  provider how often you should have your eyes checked.  Personal lifestyle choices, including:  Daily care of your teeth and gums.  Regular physical activity.  Eating a healthy diet.  Avoiding tobacco and drug use.  Limiting alcohol use.  Practicing safe sex.  Taking low-dose aspirin every day.  Taking vitamin and mineral supplements as recommended by your health care provider. What happens during an annual well check? The services and screenings done by your health care provider during your annual well check will depend on your age, overall health, lifestyle risk factors, and family history of disease. Counseling  Your health care provider may ask you questions about your:  Alcohol use.  Tobacco use.  Drug use.  Emotional well-being.  Home and relationship well-being.  Sexual activity.  Eating habits.  History of falls.  Memory and ability to understand (cognition).  Work and work Statistician.  Reproductive health. Screening  You may have the following tests or measurements:  Height, weight, and BMI.  Blood pressure.  Lipid and cholesterol levels. These may be checked every 5 years, or more frequently if you are over 76 years old.  Skin check.  Lung cancer screening. You may have this screening every year starting at age 35 if you have a 30-pack-year history of smoking and currently smoke or have quit within the past 15 years.  Fecal occult blood test (FOBT) of the stool. You may have this test every year starting at age 76.  Flexible sigmoidoscopy or colonoscopy. You may have a sigmoidoscopy  every 5 years or a colonoscopy every 10 years starting at age 30.  Hepatitis C blood test.  Hepatitis B blood test.  Sexually transmitted disease (STD) testing.  Diabetes screening. This is done by checking your blood sugar (glucose) after you have not eaten for a while (fasting). You may have this done every 1-3 years.  Bone density scan. This is done to  screen for osteoporosis. You may have this done starting at age 92.  Mammogram. This may be done every 1-2 years. Talk to your health care provider about how often you should have regular mammograms. Talk with your health care provider about your test results, treatment options, and if necessary, the need for more tests. Vaccines  Your health care provider may recommend certain vaccines, such as:  Influenza vaccine. This is recommended every year.  Tetanus, diphtheria, and acellular pertussis (Tdap, Td) vaccine. You may need a Td booster every 10 years.  Zoster vaccine. You may need this after age 26.  Pneumococcal 13-valent conjugate (PCV13) vaccine. One dose is recommended after age 17.  Pneumococcal polysaccharide (PPSV23) vaccine. One dose is recommended after age 29. Talk to your health care provider about which screenings and vaccines you need and how often you need them. This information is not intended to replace advice given to you by your health care provider. Make sure you discuss any questions you have with your health care provider. Document Released: 01/16/2015 Document Revised: 09/09/2015 Document Reviewed: 10/21/2014 Elsevier Interactive Patient Education  2017 [employer] Prevention in the Home Falls can cause injuries. They can happen to people of all ages. There are many things you can do to make your home safe and to help prevent falls. What can I do on the outside of my home?  Regularly fix the edges of walkways and driveways and fix any cracks.  Remove anything that might make you trip as you walk through a door, such as a raised step or threshold.  Trim any bushes or trees on the path to your home.  Use bright outdoor lighting.  Clear any walking paths of anything that might make someone trip, such as rocks or tools.  Regularly check to see if handrails are loose or broken. Make sure that both sides of any steps have handrails.  Any raised decks  and porches should have guardrails on the edges.  Have any leaves, snow, or ice cleared regularly.  Use sand or salt on walking paths during winter.  Clean up any spills in your garage right away. This includes oil or grease spills. What can I do in the bathroom?  Use night lights.  Install grab bars by the toilet and in the tub and shower. Do not use towel bars as grab bars.  Use non-skid mats or decals in the tub or shower.  If you need to sit down in the shower, use a plastic, non-slip stool.  Keep the floor dry. Clean up any water that spills on the floor as soon as it happens.  Remove soap buildup in the tub or shower regularly.  Attach bath mats securely with double-sided non-slip rug tape.  Do not have throw rugs and other things on the floor that can make you trip. What can I do in the bedroom?  Use night lights.  Make sure that you have a light by your bed that is easy to reach.  Do not use any sheets or blankets that are too big for your bed. They should  not hang down onto the floor.  Have a firm chair that has side arms. You can use this for support while you get dressed.  Do not have throw rugs and other things on the floor that can make you trip. What can I do in the kitchen?  Clean up any spills right away.  Avoid walking on wet floors.  Keep items that you use a lot in easy-to-reach places.  If you need to reach something above you, use a strong step stool that has a grab bar.  Keep electrical cords out of the way.  Do not use floor polish or wax that makes floors slippery. If you must use wax, use non-skid floor wax.  Do not have throw rugs and other things on the floor that can make you trip. What can I do with my stairs?  Do not leave any items on the stairs.  Make sure that there are handrails on both sides of the stairs and use them. Fix handrails that are broken or loose. Make sure that handrails are as long as the stairways.  Check any  carpeting to make sure that it is firmly attached to the stairs. Fix any carpet that is loose or worn.  Avoid having throw rugs at the top or bottom of the stairs. If you do have throw rugs, attach them to the floor with carpet tape.  Make sure that you have a light switch at the top of the stairs and the bottom of the stairs. If you do not have them, ask someone to add them for you. What else can I do to help prevent falls?  Wear shoes that:  Do not have high heels.  Have rubber bottoms.  Are comfortable and fit you well.  Are closed at the toe. Do not wear sandals.  If you use a stepladder:  Make sure that it is fully opened. Do not climb a closed stepladder.  Make sure that both sides of the stepladder are locked into place.  Ask someone to hold it for you, if possible.  Clearly mark and make sure that you can see:  Any grab bars or handrails.  First and last steps.  Where the edge of each step is.  Use tools that help you move around (mobility aids) if they are needed. These include:  Canes.  Walkers.  Scooters.  Crutches.  Turn on the lights when you go into a dark area. Replace any light bulbs as soon as they burn out.  Set up your furniture so you have a clear path. Avoid moving your furniture around.  If any of your floors are uneven, fix them.  If there are any pets around you, be aware of where they are.  Review your medicines with your doctor. Some medicines can make you feel dizzy. This can increase your chance of falling. Ask your doctor what other things that you can do to help prevent falls. This information is not intended to replace advice given to you by your health care provider. Make sure you discuss any questions you have with your health care provider. Document Released: 10/16/2008 Document Revised: 05/28/2015 Document Reviewed: 01/24/2014 Elsevier Interactive Patient Education  2017 [employer].

## 2020-02-02 ENCOUNTER — Encounter: Payer: Self-pay | Admitting: Internal Medicine

## 2020-02-02 NOTE — Assessment & Plan Note (Signed)
Follow cbc.  

## 2020-02-02 NOTE — Assessment & Plan Note (Signed)
Has seen [facility]. Last evaluated 01/2019.  Stable.  Recommended f/u in one year.  Due f/u.

## 2020-02-02 NOTE — Assessment & Plan Note (Signed)
Has been followed by Dr Kathyrn Sheriff.  Allergy injections.  Allegra and steroid nasal spray.  Stable.

## 2020-02-02 NOTE — Assessment & Plan Note (Signed)
S/p surgery.  Doing better.  Off tramadol.  Uses tylenol.  Follow.

## 2020-02-02 NOTE — Assessment & Plan Note (Signed)
On valsartan/hctz and amlodipine.  Blood pressure has been doing well.  Follow pressures.  Follow metabolic panel.

## 2020-02-02 NOTE — Assessment & Plan Note (Signed)
Sugars in am averaging 160-200.  Uses SSI.  Sees Dr Honor Junes.  Discussed low carb diet and exercise.  Follow met b and a1c.

## 2020-02-02 NOTE — Assessment & Plan Note (Signed)
On thyroid replacement.  Follow tsh.  

## 2020-02-02 NOTE — Assessment & Plan Note (Signed)
Upper symptoms controlled.  On protonix.  

## 2020-02-02 NOTE — Assessment & Plan Note (Signed)
On crestor.  Low cholesterol diet and exercise.  Follow lipid panel and liver function tests.   

## 2020-02-03 DIAGNOSIS — J301 Allergic rhinitis due to pollen: Secondary | ICD-10-CM | POA: Diagnosis not present

## 2020-02-08 ENCOUNTER — Other Ambulatory Visit: Payer: Self-pay | Admitting: Internal Medicine

## 2020-02-09 ENCOUNTER — Other Ambulatory Visit: Payer: Self-pay | Admitting: Internal Medicine

## 2020-02-10 ENCOUNTER — Ambulatory Visit (INDEPENDENT_AMBULATORY_CARE_PROVIDER_SITE_OTHER): Payer: PPO | Admitting: Nurse Practitioner

## 2020-02-10 ENCOUNTER — Other Ambulatory Visit: Payer: Self-pay

## 2020-02-10 ENCOUNTER — Other Ambulatory Visit (INDEPENDENT_AMBULATORY_CARE_PROVIDER_SITE_OTHER): Payer: Self-pay | Admitting: Vascular Surgery

## 2020-02-10 ENCOUNTER — Encounter (INDEPENDENT_AMBULATORY_CARE_PROVIDER_SITE_OTHER): Payer: Self-pay | Admitting: Nurse Practitioner

## 2020-02-10 ENCOUNTER — Ambulatory Visit (INDEPENDENT_AMBULATORY_CARE_PROVIDER_SITE_OTHER): Payer: PPO

## 2020-02-10 VITALS — BP 131/72 | HR 75 | Resp 16 | Wt 185.0 lb

## 2020-02-10 DIAGNOSIS — I779 Disorder of arteries and arterioles, unspecified: Secondary | ICD-10-CM

## 2020-02-10 DIAGNOSIS — E1069 Type 1 diabetes mellitus with other specified complication: Secondary | ICD-10-CM | POA: Diagnosis not present

## 2020-02-10 DIAGNOSIS — I679 Cerebrovascular disease, unspecified: Secondary | ICD-10-CM

## 2020-02-10 DIAGNOSIS — I10 Essential (primary) hypertension: Secondary | ICD-10-CM | POA: Diagnosis not present

## 2020-02-10 DIAGNOSIS — E785 Hyperlipidemia, unspecified: Secondary | ICD-10-CM | POA: Diagnosis not present

## 2020-02-16 ENCOUNTER — Encounter (INDEPENDENT_AMBULATORY_CARE_PROVIDER_SITE_OTHER): Payer: Self-pay | Admitting: Nurse Practitioner

## 2020-02-16 NOTE — Progress Notes (Signed)
Subjective:    Patient ID: Marissa Huff, female    DOB: May 04, 1931, 87 y.o.   MRN: [id number] Chief Complaint  Patient presents with  . Carotid    1 yr follow up    The patient is seen for follow up evaluation of carotid stenosis. The carotid stenosis followed by ultrasound.   The patient denies amaurosis fugax. There is no recent history of TIA symptoms or focal motor deficits. There is no prior documented CVA.  The patient is taking enteric-coated aspirin 81 mg daily.  There is no history of migraine headaches. There is no history of seizures.  The patient has a history of coronary artery disease, no recent episodes of angina or shortness of breath. The patient denies PAD or claudication symptoms. There is a history of hyperlipidemia which is being treated with a statin.    Carotid Duplex done today shows 40-59% Right ICA and 1-39% Left ICA.  No change compared to last study in 01/29/19.   Review of Systems  Musculoskeletal: Positive for arthralgias.  All other systems reviewed and are negative.      Objective:   Physical Exam Vitals reviewed.  HENT:     Head: Normocephalic.  Neck:     Vascular: Carotid bruit present.  Cardiovascular:     Rate and Rhythm: Normal rate.     Pulses: Normal pulses.          Carotid pulses are on the right side with bruit and on the left side with bruit. Pulmonary:     Effort: Pulmonary effort is normal.  Neurological:     Mental Status: She is alert and oriented to person, place, and time.  Psychiatric:        Mood and Affect: Mood normal.        Behavior: Behavior normal.        Thought Content: Thought content normal.        Judgment: Judgment normal.     BP 131/72 (BP Location: Right Arm)   Pulse 75   Resp 16   Wt 185 lb (83.9 kg)   BMI 32.77 kg/m   Past Medical History:  Diagnosis Date  . Allergy   . Arthritis   . Carpal tunnel syndrome, bilateral   . Diabetes mellitus without complication ([place])   .  Diverticulosis   . 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 Gastroparesis   . GERD (gastroesophageal reflux disease)   . Hypertension   . Hypothyroidism   . PUD (peptic ulcer disease)   . Skin cancer   . Spinal stenosis    lumbar laminectomy    Social History   Socioeconomic History  . Marital status: Married    Spouse name: Not on file  . Number of children: Not on file  . Years of education: Not on file  . Highest education level: Not on file  Occupational History  . Not on file  Tobacco Use  . Smoking status: Never Smoker  . Smokeless tobacco: Never Used  Vaping Use  . Vaping Use: Never used  Substance and Sexual Activity  . Alcohol use: No    Alcohol/week: 0.0 standard drinks  . Drug use: No  . Sexual activity: Not Currently    Birth control/protection: None, Post-menopausal  Other Topics Concern  . Not on file  Social History Narrative  . Not on file   Social Determinants of Health   Financial Resource Strain: Low Risk   . Difficulty of Paying Living Expenses: Not  hard at all  Food Insecurity: No Food Insecurity  . Worried About Charity fundraiser in the Last Year: Never true  . Ran Out of Food in the Last Year: Never true  Transportation Needs: No Transportation Needs  . Lack of Transportation (Medical): No  . Lack of Transportation (Non-Medical): No  Physical Activity: Not on file  Stress: No Stress Concern Present  . Feeling of Stress : Not at all  Social Connections: Socially Integrated  . Frequency of Communication with Friends and Family: More than three times a week  . Frequency of Social Gatherings with Friends and Family: More than three times a week  . Attends Religious Services: More than 4 times per year  . Active Member of Clubs or Organizations: Yes  . Attends Archivist Meetings: Not on file  . Marital Status: Married  Human resources officer Violence: Not At Risk  . Fear of Current or Ex-Partner: No  . Emotionally Abused: No  . Physically  Abused: No  . Sexually Abused: No    Past Surgical History:  Procedure Laterality Date  . ABDOMINAL HYSTERECTOMY    . antrectomy and vagotomy  1994  . arthroscopic left knee  12/05/08  . BACK SURGERY    . carpal tunnel repai  10/10   left   . CATARACT EXTRACTION, BILATERAL    . EYE SURGERY     bilateral cataracts  . 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 LAPAROSCOPIC CHOLECYSTECTOMY  6/01  . LUMBAR LAMINECTOMY  1999  . LUMBAR LAMINECTOMY WITH COFLEX 1 LEVEL N/A 09/15/2015   Procedure: Lumbar one-two Laminectomy with placement of coflex;  Surgeon: Kristeen Miss, MD;  Location: [facility];  Service: Neurosurgery;  Laterality: N/A;  . PARTIAL HYSTERECTOMY  1960   fibroid tumors, ovaries not removed  . tonsillectomy    . TONSILLECTOMY      Family History  Problem Relation Age of Onset  . Stroke Mother   . Stroke Father   . Hypertension Father   . Diabetes Brother   . Multiple sclerosis Brother   . Breast cancer Daughter 18  . Colon cancer Neg Hx     Allergies  Allergen Reactions  . Penicillins Rash    CBC Latest Ref Rng & Units 09/19/2019 06/01/2018 05/02/2017  WBC 4.0 - 10.5 K/uL 4.9 6.5 4.6  Hemoglobin 12.0 - 15.0 g/dL 12.2 12.4 12.0  Hematocrit 36.0 - 46.0 % 36.2 35.4(L) 34.9(L)  Platelets 150.0 - 400.0 K/uL 197.0 230.0 221.0      CMP     Component Value Date/Time   NA 139 01/23/2020 0857   K 4.0 01/23/2020 0857   CL 103 01/23/2020 0857   CO2 31 01/23/2020 0857   GLUCOSE 257 (H) 01/23/2020 0857   BUN 22 01/23/2020 0857   CREATININE 0.84 01/23/2020 0857   CREATININE 0.94 03/26/2012 0920   CALCIUM 10.0 01/23/2020 0857   PROT 7.0 01/23/2020 0857   ALBUMIN 4.2 01/23/2020 0857   AST 14 01/23/2020 0857   ALT 15 01/23/2020 0857   ALKPHOS 74 01/23/2020 0857   BILITOT 0.5 01/23/2020 0857   GFRNONAA >60 09/09/2015 1422   GFRNONAA 58 (L) 03/26/2012 0920   GFRAA >60 09/09/2015 1422   GFRAA >60 03/26/2012 0920     No results found.     Assessment & Plan:    1. Carotid artery disease, unspecified laterality ([place]) Recommend:  Given the patient's asymptomatic subcritical stenosis no further invasive testing or surgery at this  time.  Duplex ultrasound shows 40-59& Right ICA and 1-39% Left ICA.  Continue antiplatelet therapy as prescribed Continue management of CAD, HTN and Hyperlipidemia Healthy heart diet,  encouraged exercise at least 4 times per week Follow up in 12 months with duplex ultrasound and physical exam   2. Hyperlipidemia due to type 1 diabetes mellitus ([place]) Continue statin as ordered and reviewed, no changes at this time   3. Essential hypertension Continue antihypertensive medications as already ordered, these medications have been reviewed and there are no changes at this time.    Current Outpatient Medications on File Prior to Visit  Medication Sig Dispense Refill  . albuterol (VENTOLIN HFA) 108 (90 Base) MCG/ACT inhaler Inhale 2 puffs into the lungs every 6 (six) hours as needed for wheezing or shortness of breath. 1 each 1  . amLODipine (NORVASC) 5 MG tablet TAKE 1 TABLET BY MOUTH EVERY DAY 90 tablet 1  . aspirin 81 MG tablet Take 81 mg by mouth daily.    . BD PEN NEEDLE NANO U/F 32G X 4 MM MISC USE 1 EACH 4 (FOUR) TIMES DAILY    . Calcium Citrate-Vitamin D (CALCIUM + D PO) Take 1 tablet by mouth daily.    . Continuous Blood Gluc Receiver (FREESTYLE LIBRE 14 DAY READER) DEVI Use 1 each as needed .  Use to test blood sugar    . Continuous Blood Gluc Sensor (FREESTYLE LIBRE 14 DAY SENSOR) MISC Use 1 each every 14 (fourteen) days    . fexofenadine (ALLEGRA) 180 MG tablet Take 180 mg by mouth daily.    . fluticasone (FLONASE) 50 MCG/ACT nasal spray SPRAY 2 SPRAYS INTO EACH NOSTRIL EVERY DAY 48 mL 3  . glucagon (GLUCAGON EMERGENCY) 1 MG injection Inject 1 mg into the muscle once as needed. 1 each 3  . glucose blood test strip     . Insulin Aspart Prot & Aspart (NOVOLOG MIX 70/30 [place]) Inject 11 Units into the skin 3  (three) times daily with meals.    Marland Kitchen ipratropium (ATROVENT) 0.06 % nasal spray PLACE 2 SPRAYS INTO BOTH NOSTRILS 4 (FOUR) TIMES DAILY AS NEEDED FOR RHINITIS. 15 mL 1  . LANTUS SOLOSTAR 100 UNIT/ML Solostar Pen INJECT 24UNITS AT BEDTIME  4  . levothyroxine (SYNTHROID) 150 MCG tablet TAKE 1 TABLET BY MOUTH EVERY DAY 90 tablet 3  . LORazepam (ATIVAN) 0.5 MG tablet Take 0.5-1 tablets (0.25-0.5 mg total) by mouth daily as needed for anxiety. 30 tablet 0  . metFORMIN (GLUCOPHAGE-XR) 500 MG 24 hr tablet TAKE 1 TABLET BY MOUTH TWICE A DAY 180 tablet 1  . Multiple Vitamins-Minerals (MULTIVITAMIN PO) Take 1 tablet by mouth daily.     . Multiple Vitamins-Minerals (PRESERVISION AREDS PO) Take 1 capsule by mouth 2 (two) times daily.    . pantoprazole (PROTONIX) 40 MG tablet TAKE 1 TABLET BY MOUTH TWICE A DAY 180 tablet 1  . rosuvastatin (CRESTOR) 5 MG tablet TAKE 1 TABLET (5 MG TOTAL) BY MOUTH 3 (THREE) TIMES A WEEK. 40 tablet 1  . valsartan-hydrochlorothiazide (DIOVAN-HCT) 160-12.5 MG tablet TAKE 1 TABLET BY MOUTH EVERY DAY 90 tablet 1   No current facility-administered medications on file prior to visit.    There are no Patient Instructions on file for this visit. No follow-ups on file.   Kris Hartmann, NP

## 2020-02-17 DIAGNOSIS — J301 Allergic rhinitis due to pollen: Secondary | ICD-10-CM | POA: Diagnosis not present

## 2020-03-02 DIAGNOSIS — J301 Allergic rhinitis due to pollen: Secondary | ICD-10-CM | POA: Diagnosis not present

## 2020-03-04 ENCOUNTER — Other Ambulatory Visit: Payer: Self-pay | Admitting: Internal Medicine

## 2020-03-09 DIAGNOSIS — J301 Allergic rhinitis due to pollen: Secondary | ICD-10-CM | POA: Diagnosis not present

## 2020-03-18 DIAGNOSIS — J301 Allergic rhinitis due to pollen: Secondary | ICD-10-CM | POA: Diagnosis not present

## 2020-03-19 ENCOUNTER — Emergency Department
Admission: EM | Admit: 2020-03-19 | Discharge: 2020-03-19 | Disposition: A | Discharge status: Home / Self Care | Payer: PPO | Attending: Emergency Medicine | Admitting: Emergency Medicine

## 2020-03-19 ENCOUNTER — Other Ambulatory Visit: Payer: Self-pay

## 2020-03-19 ENCOUNTER — Emergency Department: Payer: PPO

## 2020-03-19 DIAGNOSIS — I129 Hypertensive chronic kidney disease with stage 1 through stage 4 chronic kidney disease, or unspecified chronic kidney disease: Secondary | ICD-10-CM | POA: Diagnosis not present

## 2020-03-19 DIAGNOSIS — N182 Chronic kidney disease, stage 2 (mild): Secondary | ICD-10-CM | POA: Diagnosis not present

## 2020-03-19 DIAGNOSIS — M25562 Pain in left knee: Secondary | ICD-10-CM | POA: Diagnosis not present

## 2020-03-19 DIAGNOSIS — W0110XA Fall on same level from slipping, tripping and stumbling with subsequent striking against unspecified object, initial encounter: Secondary | ICD-10-CM | POA: Diagnosis not present

## 2020-03-19 DIAGNOSIS — I10 Essential (primary) hypertension: Secondary | ICD-10-CM | POA: Diagnosis not present

## 2020-03-19 DIAGNOSIS — S0031XA Abrasion of nose, initial encounter: Secondary | ICD-10-CM | POA: Insufficient documentation

## 2020-03-19 DIAGNOSIS — Z85828 Personal history of other malignant neoplasm of skin: Secondary | ICD-10-CM | POA: Insufficient documentation

## 2020-03-19 DIAGNOSIS — I6782 Cerebral ischemia: Secondary | ICD-10-CM | POA: Diagnosis not present

## 2020-03-19 DIAGNOSIS — E039 Hypothyroidism, unspecified: Secondary | ICD-10-CM | POA: Diagnosis not present

## 2020-03-19 DIAGNOSIS — Y92009 Unspecified place in unspecified non-institutional (private) residence as the place of occurrence of the external cause: Secondary | ICD-10-CM | POA: Insufficient documentation

## 2020-03-19 DIAGNOSIS — Z794 Long term (current) use of insulin: Secondary | ICD-10-CM | POA: Insufficient documentation

## 2020-03-19 DIAGNOSIS — S022XXA Fracture of nasal bones, initial encounter for closed fracture: Secondary | ICD-10-CM | POA: Insufficient documentation

## 2020-03-19 DIAGNOSIS — Z7984 Long term (current) use of oral hypoglycemic drugs: Secondary | ICD-10-CM | POA: Diagnosis not present

## 2020-03-19 DIAGNOSIS — M542 Cervicalgia: Secondary | ICD-10-CM | POA: Insufficient documentation

## 2020-03-19 DIAGNOSIS — S0992XA Unspecified injury of nose, initial encounter: Secondary | ICD-10-CM | POA: Diagnosis present

## 2020-03-19 DIAGNOSIS — E1022 Type 1 diabetes mellitus with diabetic chronic kidney disease: Secondary | ICD-10-CM | POA: Insufficient documentation

## 2020-03-19 DIAGNOSIS — Z79899 Other long term (current) drug therapy: Secondary | ICD-10-CM | POA: Insufficient documentation

## 2020-03-19 DIAGNOSIS — S199XXA Unspecified injury of neck, initial encounter: Secondary | ICD-10-CM | POA: Diagnosis not present

## 2020-03-19 DIAGNOSIS — W19XXXA Unspecified fall, initial encounter: Secondary | ICD-10-CM

## 2020-03-19 DIAGNOSIS — S0990XA Unspecified injury of head, initial encounter: Secondary | ICD-10-CM | POA: Diagnosis not present

## 2020-03-19 MED ORDER — DOXYCYCLINE HYCLATE 100 MG PO CAPS: 100.0000 mg | ORAL_CAPSULE | Freq: Two times a day (BID) | ORAL | 0 refills | Status: AC

## 2020-03-19 NOTE — Discharge Instructions (Signed)
Take Doxycycline twice daily for ten days.

## 2020-03-19 NOTE — ED Triage Notes (Signed)
Pt states she was walking down a hall at home, stubbed her toe and fell. Pt states hitting her head on a piece of wood on the floor. Pt denies LOC and denies being on blood thinners.  Pt states pain to face, back and left knee.

## 2020-03-19 NOTE — ED Triage Notes (Signed)
First nurse note: ems from home s/p fall. Per ems pt tripped over own feet and is c/o left face and knee pain. Per ems cbg [phone]

## 2020-03-19 NOTE — ED Notes (Signed)
See triage note  Presents s/p fall  States she stubbed her toe  [address] face first    Hitting head and knee

## 2020-03-19 NOTE — ED Provider Notes (Signed)
[facility]-EMERGENCY DEPARTMENT  ____________________________________________  Time seen: Approximately 6:15 PM  I have reviewed the triage vital signs and the nursing notes.   HISTORY  Chief Complaint Fall   Historian Patient     HPI Marissa Huff is a 87 y.o. female presents to the emergency department after patient had a mechanical fall.  Patient states that she slipped over slides.  She hit her face but denies losing consciousness.  She denies neck pain.  No numbness or tingling in the upper and lower extremities.  No chest pain, chest tightness or abdominal pain.  Patient has been able to ambulate since fall occurred.   Past Medical History:  Diagnosis Date  . Allergy   . Arthritis   . Carpal tunnel syndrome, bilateral   . Diabetes mellitus without complication ([place])   . Diverticulosis   . 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 Gastroparesis   . GERD (gastroesophageal reflux disease)   . Hypertension   . Hypothyroidism   . PUD (peptic ulcer disease)   . Skin cancer   . Spinal stenosis    lumbar laminectomy     Immunizations up to date:  Yes.     Past Medical History:  Diagnosis Date  . Allergy   . Arthritis   . Carpal tunnel syndrome, bilateral   . Diabetes mellitus without complication ([place])   . Diverticulosis   . 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 Gastroparesis   . GERD (gastroesophageal reflux disease)   . Hypertension   . Hypothyroidism   . PUD (peptic ulcer disease)   . Skin cancer   . Spinal stenosis    lumbar laminectomy    Patient Active Problem List   Diagnosis Date Noted  . Sinusitis 12/30/2019  . Macular degeneration 10/01/2018  . Carotid artery disease, unspecified laterality ([place]) 02/23/2018  . Right carotid bruit 02/04/2018  . Lumbar stenosis with neurogenic claudication 09/15/2015  . Nephrolithiasis 06/28/2015  . Health care maintenance 05/11/2014  . Obesity (BMI 30-39.9) 01/11/2014  . GERD (gastroesophageal reflux disease) 01/11/2014  .  Environmental allergies 01/11/2014  . Stage 2 chronic kidney disease due to type 2 diabetes mellitus ([place]) 10/23/2013  . Hydronephrosis 08/26/2013  . Constipation 08/05/2013  . DDD (degenerative disc disease), lumbar 07/15/2013  . Lumbar radiculitis 05/10/2013  . Right shoulder pain 05/10/2013  . Anemia 04/15/2013  . Carpal tunnel syndrome 04/15/2013  . Trigger finger 04/15/2013  . Hyperlipidemia due to type 1 diabetes mellitus ([place]) 04/15/2013  . Type II diabetes mellitus ([place]) 04/15/2013  . Microscopic hematuria 09/20/2012  . Incomplete emptying of bladder 02/27/2012  . Mixed urge and stress incontinence 02/27/2012  . Symptoms involving urinary system 02/27/2012  . Recurrent UTI 01/27/2012  . History of lymphoma 12/12/2011  . Spinal stenosis 12/12/2011  . Diverticulosis 12/12/2011  . Gastroparesis 12/12/2011  . Peptic ulcer disease 12/12/2011  . Hypothyroidism 12/12/2011  . Hypercholesterolemia 12/12/2011  . Hypertension 12/12/2011  . Type 1 diabetes mellitus with hyperglycemia ([place]) 12/12/2011  . Diverticulosis of colon 12/12/2011  . Malignant lymphoma of intra-abdominal lymph nodes ([place]) 12/12/2011    Past Surgical History:  Procedure Laterality Date  . ABDOMINAL HYSTERECTOMY    . antrectomy and vagotomy  1994  . arthroscopic left knee  12/05/08  . BACK SURGERY    . carpal tunnel repai  10/10   left   . CATARACT EXTRACTION, BILATERAL    . EYE SURGERY     bilateral cataracts  . 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  LAPAROSCOPIC CHOLECYSTECTOMY  6/01  . LUMBAR LAMINECTOMY  1999  . LUMBAR LAMINECTOMY WITH COFLEX 1 LEVEL N/A 09/15/2015   Procedure: Lumbar one-two Laminectomy with placement of coflex;  Surgeon: Kristeen Miss, MD;  Location: [place] [facility];  Service: Neurosurgery;  Laterality: N/A;  . PARTIAL HYSTERECTOMY  1960   fibroid tumors, ovaries not removed  . tonsillectomy    . TONSILLECTOMY      Prior to Admission medications   Medication Sig Start Date End Date  Taking? Authorizing Provider  doxycycline (VIBRAMYCIN) 100 MG capsule Take 1 capsule (100 mg total) by mouth 2 (two) times daily for 10 days. 03/19/20 03/29/20 Yes Vallarie Mare M, PA-C  albuterol (VENTOLIN HFA) 108 (90 Base) MCG/ACT inhaler Inhale 2 puffs into the lungs every 6 (six) hours as needed for wheezing or shortness of breath. 12/23/19   Einar Pheasant, MD  amLODipine (NORVASC) 5 MG tablet TAKE 1 TABLET BY MOUTH EVERY DAY 12/16/19   Einar Pheasant, MD  aspirin 81 MG tablet Take 81 mg by mouth daily.    [provider]  BD PEN NEEDLE NANO U/F 32G X 4 MM MISC USE 1 EACH 4 (FOUR) TIMES DAILY 04/03/18   [provider]  Calcium Citrate-Vitamin D (CALCIUM + D PO) Take 1 tablet by mouth daily.    [provider]  Continuous Blood Gluc Receiver (FREESTYLE LIBRE 14 DAY READER) DEVI Use 1 each as needed .  Use to test blood sugar 10/25/17   [provider]  Continuous Blood Gluc Sensor (FREESTYLE LIBRE 14 DAY SENSOR) MISC Use 1 each every 14 (fourteen) days 10/25/17   [provider]  fexofenadine (ALLEGRA) 180 MG tablet Take 180 mg by mouth daily.    [provider]  fluticasone (FLONASE) 50 MCG/ACT nasal spray SPRAY 2 SPRAYS INTO EACH NOSTRIL EVERY DAY 01/03/19   Einar Pheasant, MD  glucagon (GLUCAGON EMERGENCY) 1 MG injection Inject 1 mg into the muscle once as needed. 05/21/15   Einar Pheasant, MD  glucose blood test strip  10/21/13   [provider]  Insulin Aspart Prot & Aspart (NOVOLOG MIX 70/30 [place]) Inject 11 Units into the skin 3 (three) times daily with meals.    [provider]  ipratropium (ATROVENT) 0.06 % nasal spray PLACE 2 SPRAYS INTO BOTH NOSTRILS 4 (FOUR) TIMES DAILY AS NEEDED FOR RHINITIS. 02/10/20   Einar Pheasant, MD  LANTUS SOLOSTAR 100 UNIT/ML Solostar Pen INJECT 24UNITS AT BEDTIME 03/08/17   [provider]  levothyroxine (SYNTHROID) 150 MCG tablet TAKE 1 TABLET BY MOUTH EVERY DAY 12/16/19   Einar Pheasant, MD  LORazepam (ATIVAN) 0.5 MG tablet Take 0.5-1 tablets (0.25-0.5 mg total) by mouth daily as needed for anxiety. 05/05/17   Einar Pheasant, MD  metFORMIN (GLUCOPHAGE-XR) 500 MG 24 hr tablet TAKE 1 TABLET BY MOUTH TWICE A DAY 12/16/19   Einar Pheasant, MD  Multiple Vitamins-Minerals (MULTIVITAMIN PO) Take 1 tablet by mouth daily.     [provider]  Multiple Vitamins-Minerals (PRESERVISION AREDS PO) Take 1 capsule by mouth 2 (two) times daily.    [provider]  pantoprazole (PROTONIX) 40 MG tablet TAKE 1 TABLET BY MOUTH TWICE A DAY 12/16/19   Einar Pheasant, MD  rosuvastatin (CRESTOR) 5 MG tablet TAKE 1 TABLET (5 MG TOTAL) BY MOUTH 3 (THREE) TIMES A WEEK. 02/10/20   Einar Pheasant, MD  valsartan-hydrochlorothiazide (DIOVAN-HCT) 160-12.5 MG tablet TAKE 1 TABLET BY MOUTH EVERY DAY 12/18/19   Einar Pheasant, MD  Allergies Penicillins  Family History  Problem Relation Age of Onset  . Stroke Mother   . Stroke Father   . Hypertension Father   . Diabetes Brother   . Multiple sclerosis Brother   . Breast cancer Daughter 3  . Colon cancer Neg Hx     Social History Social History   Tobacco Use  . Smoking status: Never Smoker  . Smokeless tobacco: Never Used  Vaping Use  . Vaping Use: Never used  Substance Use Topics  . Alcohol use: No    Alcohol/week: 0.0 standard drinks  . Drug use: No     Review of Systems  Constitutional: No fever/chills Eyes:  No discharge ENT: No upper respiratory complaints. Respiratory: no cough. No SOB/ use of accessory muscles to breath Gastrointestinal:   No nausea, no vomiting.  No diarrhea.  No constipation. Musculoskeletal: Patient has left knee pain.  Skin: Negative for rash, abrasions, lacerations, ecchymosis.   ____________________________________________   PHYSICAL EXAM:  VITAL SIGNS: ED Triage Vitals  Enc Vitals Group     BP 03/19/20 1625 (!) 147/124     Pulse Rate 03/19/20 1625 72     Resp 03/19/20  1625 (!) 23     Temp 03/19/20 1625 98.5 F (36.9 C)     Temp src --      SpO2 03/19/20 1625 97 %     Weight 03/19/20 1627 182 lb (82.6 kg)     Height 03/19/20 1627 5\' 3"  (1.6 m)     Head Circumference --      Peak Flow --      Pain Score 03/19/20 1626 4     Pain Loc --      Pain Edu? --      Excl. in [place]? --      Constitutional: Alert and oriented. Well appearing and in no acute distress. Eyes: Conjunctivae are normal. PERRL. EOMI. Head: Atraumatic. ENT:      Nose: Patient has abrasion across nose.       Mouth/Throat: Mucous membranes are moist.  Neck: No stridor.  Full range of motion.  No midline C-spine tenderness to palpation. Cardiovascular: Normal rate, regular rhythm. Normal S1 and S2.  Good peripheral circulation. Respiratory: Normal respiratory effort without tachypnea or retractions. Lungs CTAB. Good air entry to the bases with no decreased or absent breath sounds Gastrointestinal: Bowel sounds x 4 quadrants. Soft and nontender to palpation. No guarding or rigidity. No distention. Musculoskeletal: Full range of motion to all extremities. No obvious deformities noted Neurologic:  Normal for age. No gross focal neurologic deficits are appreciated.  Skin:  Skin is warm, dry and intact. No rash noted. Psychiatric: Mood and affect are normal for age. Speech and behavior are normal.   ____________________________________________   LABS (all labs ordered are listed, but only abnormal results are displayed)  Labs Reviewed - No data to display ____________________________________________  EKG   ____________________________________________  RADIOLOGY Unk Pinto, personally viewed and evaluated these images (plain radiographs) as part of my medical decision making, as well as reviewing the written report by the radiologist.  CT Head Wo Contrast  Result Date: 03/19/2020 CLINICAL DATA:  Facial trauma. Head trauma, minor. Neck trauma. Additional history provided:  Fall at home, patient reports face and knee pain. EXAM: CT HEAD WITHOUT CONTRAST CT MAXILLOFACIAL WITHOUT CONTRAST CT CERVICAL SPINE WITHOUT CONTRAST TECHNIQUE: Multidetector CT imaging of the head, cervical spine, and maxillofacial structures were performed using the standard protocol without intravenous contrast. Multiplanar  CT image reconstructions of the cervical spine and maxillofacial structures were also generated. COMPARISON:  Brain MRI 03/27/2015. FINDINGS: CT HEAD FINDINGS Brain: Mild generalized cerebral atrophy. Mild for age ill-defined hypoattenuation within the cerebral white matter is nonspecific, but compatible with chronic small vessel ischemic disease. There is no acute intracranial hemorrhage. No demarcated cortical infarct. No extra-axial fluid collection. No evidence of intracranial mass. No midline shift. Vascular: No hyperdense vessel.  Atherosclerotic calcifications. Skull: Normal. Negative for fracture or focal lesion. Other: No significant mastoid effusion. CT MAXILLOFACIAL FINDINGS Osseous: Acute comminuted and mildly displaced bilateral nasal bone fractures. Additionally, an acute mildly displaced fracture of the bony nasal septum is suspected superiorly (for instance as seen on series 7, image 16) (series 3, images 27 and 28). No other acute maxillofacial fracture is identified. Orbits: No acute finding within the orbits. The globes are normal in size and contour. The extraocular muscles and optic nerve sheath complexes are symmetric and unremarkable. No evidence of retrobulbar hematoma. Sinuses: Trace bilateral ethmoid sinus mucosal thickening. Small fluid level and small volume frothy secretions within the right sphenoid sinus. Trace mucosal thickening within the right maxillary sinus inferiorly. Small right ethmoid sinus osteoma. Soft tissues: Prominent left maxillofacial soft tissue swelling. Nasal soft tissue swelling is also noted. CT CERVICAL SPINE FINDINGS Mildly motion degraded  exam. Alignment: Straightening of the expected cervical lordosis. 2 mm C4-C5 grade 1 anterolisthesis. Skull base and vertebrae: The basion-dental and atlanto-dental intervals are maintained.No evidence of acute fracture to the cervical spine. Soft tissues and spinal canal: No prevertebral fluid or swelling. No visible canal hematoma. Disc levels: Cervical and upper thoracic spondylosis with multilevel disc space narrowing, disc bulges, uncovertebral hypertrophy and facet arthrosis. Disc space narrowing is greatest at C3-C4 (moderate/advanced), C5-C6 (moderate) and T2-T3 (severe). No appreciable high-grade spinal canal stenosis. Multilevel bony neural foraminal narrowing. Upper chest: No consolidation within the imaged lung apices. No visible pneumothorax. IMPRESSION: CT head: 1. No evidence of acute intracranial abnormality. 2. Mild cerebral atrophy and chronic small vessel ischemic disease. CT maxillofacial: 1. Acute comminuted and mildly displaced bilateral nasal bone fractures. 2. Suspect additional acute mildly displaced fracture of the bony nasal septum superiorly. 3. Prominent left maxillofacial soft tissue swelling. 4. Nasal soft tissue swelling. 5. Paranasal sinus disease as described. Most notably, there is small volume fluid within the right sphenoid sinus. CT cervical spine: 1. Mildly motion degraded exam. 2. No evidence of acute fracture to the cervical spine. 3. 2 mm C4-C5 grade 1 anterolisthesis. 4. Cervical and upper thoracic spondylosis as described. Electronically Signed   By: Kellie Simmering DO   On: 03/19/2020 17:27   CT Cervical Spine Wo Contrast  Result Date: 03/19/2020 CLINICAL DATA:  Facial trauma. Head trauma, minor. Neck trauma. Additional history provided: Fall at home, patient reports face and knee pain. EXAM: CT HEAD WITHOUT CONTRAST CT MAXILLOFACIAL WITHOUT CONTRAST CT CERVICAL SPINE WITHOUT CONTRAST TECHNIQUE: Multidetector CT imaging of the head, cervical spine, and maxillofacial  structures were performed using the standard protocol without intravenous contrast. Multiplanar CT image reconstructions of the cervical spine and maxillofacial structures were also generated. COMPARISON:  Brain MRI 03/27/2015. FINDINGS: CT HEAD FINDINGS Brain: Mild generalized cerebral atrophy. Mild for age ill-defined hypoattenuation within the cerebral white matter is nonspecific, but compatible with chronic small vessel ischemic disease. There is no acute intracranial hemorrhage. No demarcated cortical infarct. No extra-axial fluid collection. No evidence of intracranial mass. No midline shift. Vascular: No hyperdense vessel.  Atherosclerotic calcifications. Skull: Normal. Negative  for fracture or focal lesion. Other: No significant mastoid effusion. CT MAXILLOFACIAL FINDINGS Osseous: Acute comminuted and mildly displaced bilateral nasal bone fractures. Additionally, an acute mildly displaced fracture of the bony nasal septum is suspected superiorly (for instance as seen on series 7, image 16) (series 3, images 27 and 28). No other acute maxillofacial fracture is identified. Orbits: No acute finding within the orbits. The globes are normal in size and contour. The extraocular muscles and optic nerve sheath complexes are symmetric and unremarkable. No evidence of retrobulbar hematoma. Sinuses: Trace bilateral ethmoid sinus mucosal thickening. Small fluid level and small volume frothy secretions within the right sphenoid sinus. Trace mucosal thickening within the right maxillary sinus inferiorly. Small right ethmoid sinus osteoma. Soft tissues: Prominent left maxillofacial soft tissue swelling. Nasal soft tissue swelling is also noted. CT CERVICAL SPINE FINDINGS Mildly motion degraded exam. Alignment: Straightening of the expected cervical lordosis. 2 mm C4-C5 grade 1 anterolisthesis. Skull base and vertebrae: The basion-dental and atlanto-dental intervals are maintained.No evidence of acute fracture to the  cervical spine. Soft tissues and spinal canal: No prevertebral fluid or swelling. No visible canal hematoma. Disc levels: Cervical and upper thoracic spondylosis with multilevel disc space narrowing, disc bulges, uncovertebral hypertrophy and facet arthrosis. Disc space narrowing is greatest at C3-C4 (moderate/advanced), C5-C6 (moderate) and T2-T3 (severe). No appreciable high-grade spinal canal stenosis. Multilevel bony neural foraminal narrowing. Upper chest: No consolidation within the imaged lung apices. No visible pneumothorax. IMPRESSION: CT head: 1. No evidence of acute intracranial abnormality. 2. Mild cerebral atrophy and chronic small vessel ischemic disease. CT maxillofacial: 1. Acute comminuted and mildly displaced bilateral nasal bone fractures. 2. Suspect additional acute mildly displaced fracture of the bony nasal septum superiorly. 3. Prominent left maxillofacial soft tissue swelling. 4. Nasal soft tissue swelling. 5. Paranasal sinus disease as described. Most notably, there is small volume fluid within the right sphenoid sinus. CT cervical spine: 1. Mildly motion degraded exam. 2. No evidence of acute fracture to the cervical spine. 3. 2 mm C4-C5 grade 1 anterolisthesis. 4. Cervical and upper thoracic spondylosis as described. Electronically Signed   By: Kellie Simmering DO   On: 03/19/2020 17:27   DG Knee Complete 4 Views Left  Result Date: 03/19/2020 CLINICAL DATA:  Left knee pain after fall. EXAM: LEFT KNEE - COMPLETE 4+ VIEW COMPARISON:  April 25, 2010. FINDINGS: No evidence of fracture, dislocation, or joint effusion. No significant disc space narrowing is noted. Chondrocalcinosis is noted medially and laterally suggesting calcium pyrophosphate deposition disease or early degenerative joint disease. Soft tissues are unremarkable. IMPRESSION: Chondrocalcinosis is noted medially and laterally suggesting calcium pyrophosphate deposition disease or early degenerative joint disease. No acute  abnormality seen in the left knee. Electronically Signed   By: Marijo Conception M.D.   On: 03/19/2020 17:13   CT Maxillofacial Wo Contrast  Result Date: 03/19/2020 CLINICAL DATA:  Facial trauma. Head trauma, minor. Neck trauma. Additional history provided: Fall at home, patient reports face and knee pain. EXAM: CT HEAD WITHOUT CONTRAST CT MAXILLOFACIAL WITHOUT CONTRAST CT CERVICAL SPINE WITHOUT CONTRAST TECHNIQUE: Multidetector CT imaging of the head, cervical spine, and maxillofacial structures were performed using the standard protocol without intravenous contrast. Multiplanar CT image reconstructions of the cervical spine and maxillofacial structures were also generated. COMPARISON:  Brain MRI 03/27/2015. FINDINGS: CT HEAD FINDINGS Brain: Mild generalized cerebral atrophy. Mild for age ill-defined hypoattenuation within the cerebral white matter is nonspecific, but compatible with chronic small vessel ischemic disease. There is no acute  intracranial hemorrhage. No demarcated cortical infarct. No extra-axial fluid collection. No evidence of intracranial mass. No midline shift. Vascular: No hyperdense vessel.  Atherosclerotic calcifications. Skull: Normal. Negative for fracture or focal lesion. Other: No significant mastoid effusion. CT MAXILLOFACIAL FINDINGS Osseous: Acute comminuted and mildly displaced bilateral nasal bone fractures. Additionally, an acute mildly displaced fracture of the bony nasal septum is suspected superiorly (for instance as seen on series 7, image 16) (series 3, images 27 and 28). No other acute maxillofacial fracture is identified. Orbits: No acute finding within the orbits. The globes are normal in size and contour. The extraocular muscles and optic nerve sheath complexes are symmetric and unremarkable. No evidence of retrobulbar hematoma. Sinuses: Trace bilateral ethmoid sinus mucosal thickening. Small fluid level and small volume frothy secretions within the right sphenoid sinus.  Trace mucosal thickening within the right maxillary sinus inferiorly. Small right ethmoid sinus osteoma. Soft tissues: Prominent left maxillofacial soft tissue swelling. Nasal soft tissue swelling is also noted. CT CERVICAL SPINE FINDINGS Mildly motion degraded exam. Alignment: Straightening of the expected cervical lordosis. 2 mm C4-C5 grade 1 anterolisthesis. Skull base and vertebrae: The basion-dental and atlanto-dental intervals are maintained.No evidence of acute fracture to the cervical spine. Soft tissues and spinal canal: No prevertebral fluid or swelling. No visible canal hematoma. Disc levels: Cervical and upper thoracic spondylosis with multilevel disc space narrowing, disc bulges, uncovertebral hypertrophy and facet arthrosis. Disc space narrowing is greatest at C3-C4 (moderate/advanced), C5-C6 (moderate) and T2-T3 (severe). No appreciable high-grade spinal canal stenosis. Multilevel bony neural foraminal narrowing. Upper chest: No consolidation within the imaged lung apices. No visible pneumothorax. IMPRESSION: CT head: 1. No evidence of acute intracranial abnormality. 2. Mild cerebral atrophy and chronic small vessel ischemic disease. CT maxillofacial: 1. Acute comminuted and mildly displaced bilateral nasal bone fractures. 2. Suspect additional acute mildly displaced fracture of the bony nasal septum superiorly. 3. Prominent left maxillofacial soft tissue swelling. 4. Nasal soft tissue swelling. 5. Paranasal sinus disease as described. Most notably, there is small volume fluid within the right sphenoid sinus. CT cervical spine: 1. Mildly motion degraded exam. 2. No evidence of acute fracture to the cervical spine. 3. 2 mm C4-C5 grade 1 anterolisthesis. 4. Cervical and upper thoracic spondylosis as described. Electronically Signed   By: Kellie Simmering DO   On: 03/19/2020 17:27    ____________________________________________    PROCEDURES  Procedure(s) performed:      Procedures     Medications - No data to display   ____________________________________________   INITIAL IMPRESSION / ASSESSMENT AND PLAN / ED COURSE  Pertinent labs & imaging results that were available during my care of the patient were reviewed by me and considered in my medical decision making (see chart for details).      Assessment and plan Fall 87 year old female presents to the emergency department after a mechanical, nonsyncopal fall.  Vital signs are reassuring at triage.  On physical exam, patient had no neuro deficits noted.  CT indicated mild bilateral mildly displaced nasal bone fractures and a nasal septum fracture.  CT head showed no signs of intracranial bleed or skull fracture.  No signs of C-spine fracture on dedicated CT.  Was started on doxycycline twice daily for the next 10 days.  She was advised to follow-up with her ENT doctor, Dr. Kathyrn Sheriff.   Tylenol was recommended for discomfort. All patient questions were answered.      ____________________________________________  FINAL CLINICAL IMPRESSION(S) / ED DIAGNOSES  Final diagnoses:  Fall, initial  encounter      NEW MEDICATIONS STARTED DURING THIS VISIT:  ED Discharge Orders         Ordered    doxycycline (VIBRAMYCIN) 100 MG capsule  2 times daily        03/19/20 1753              This chart was dictated using voice recognition software/Dragon. Despite best efforts to proofread, errors can occur which can change the meaning. Any change was purely unintentional.     Lannie Fields, PA-C 03/19/20 1820    Vladimir Crofts, MD 03/19/20 2227

## 2020-03-23 DIAGNOSIS — J301 Allergic rhinitis due to pollen: Secondary | ICD-10-CM | POA: Diagnosis not present

## 2020-03-23 DIAGNOSIS — S022XXA Fracture of nasal bones, initial encounter for closed fracture: Secondary | ICD-10-CM | POA: Diagnosis not present

## 2020-03-27 LAB — HM DIABETES EYE EXAM

## 2020-03-31 DIAGNOSIS — H353132 Nonexudative age-related macular degeneration, bilateral, intermediate dry stage: Secondary | ICD-10-CM | POA: Diagnosis not present

## 2020-04-06 DIAGNOSIS — J301 Allergic rhinitis due to pollen: Secondary | ICD-10-CM | POA: Diagnosis not present

## 2020-04-13 DIAGNOSIS — J301 Allergic rhinitis due to pollen: Secondary | ICD-10-CM | POA: Diagnosis not present

## 2020-04-13 DIAGNOSIS — R809 Proteinuria, unspecified: Secondary | ICD-10-CM | POA: Diagnosis not present

## 2020-04-13 DIAGNOSIS — E785 Hyperlipidemia, unspecified: Secondary | ICD-10-CM | POA: Diagnosis not present

## 2020-04-13 DIAGNOSIS — E1159 Type 2 diabetes mellitus with other circulatory complications: Secondary | ICD-10-CM | POA: Diagnosis not present

## 2020-04-13 DIAGNOSIS — E039 Hypothyroidism, unspecified: Secondary | ICD-10-CM | POA: Diagnosis not present

## 2020-04-13 DIAGNOSIS — E1029 Type 1 diabetes mellitus with other diabetic kidney complication: Secondary | ICD-10-CM | POA: Diagnosis not present

## 2020-04-13 DIAGNOSIS — E1069 Type 1 diabetes mellitus with other specified complication: Secondary | ICD-10-CM | POA: Diagnosis not present

## 2020-04-13 DIAGNOSIS — I152 Hypertension secondary to endocrine disorders: Secondary | ICD-10-CM | POA: Diagnosis not present

## 2020-04-20 DIAGNOSIS — J301 Allergic rhinitis due to pollen: Secondary | ICD-10-CM | POA: Diagnosis not present

## 2020-04-27 DIAGNOSIS — J301 Allergic rhinitis due to pollen: Secondary | ICD-10-CM | POA: Diagnosis not present

## 2020-05-04 DIAGNOSIS — J301 Allergic rhinitis due to pollen: Secondary | ICD-10-CM | POA: Diagnosis not present

## 2020-05-11 DIAGNOSIS — J301 Allergic rhinitis due to pollen: Secondary | ICD-10-CM | POA: Diagnosis not present

## 2020-05-18 DIAGNOSIS — J301 Allergic rhinitis due to pollen: Secondary | ICD-10-CM | POA: Diagnosis not present

## 2020-05-25 DIAGNOSIS — J301 Allergic rhinitis due to pollen: Secondary | ICD-10-CM | POA: Diagnosis not present

## 2020-06-02 ENCOUNTER — Other Ambulatory Visit (INDEPENDENT_AMBULATORY_CARE_PROVIDER_SITE_OTHER): Payer: PPO

## 2020-06-02 ENCOUNTER — Other Ambulatory Visit: Payer: Self-pay

## 2020-06-02 DIAGNOSIS — E1065 Type 1 diabetes mellitus with hyperglycemia: Secondary | ICD-10-CM

## 2020-06-02 DIAGNOSIS — E7800 Pure hypercholesterolemia, unspecified: Secondary | ICD-10-CM | POA: Diagnosis not present

## 2020-06-02 LAB — BASIC METABOLIC PANEL
BUN: 17 mg/dL (ref 6–23)
CO2: 27 mEq/L (ref 19–32)
Calcium: 9.4 mg/dL (ref 8.4–10.5)
Chloride: 104 mEq/L (ref 96–112)
Creatinine, Ser: 0.82 mg/dL (ref 0.40–1.20)
GFR: 64.4 mL/min (ref >60.00)
Glucose, Bld: 224 mg/dL — ABNORMAL HIGH (ref 70–99)
Potassium: 4.2 mEq/L (ref 3.5–5.1)
Sodium: 140 mEq/L (ref 135–145)

## 2020-06-02 LAB — HEPATIC FUNCTION PANEL
ALT: 18 U/L (ref 0–35)
AST: 16 U/L (ref 0–37)
Albumin: 4.1 g/dL (ref 3.5–5.2)
Alkaline Phosphatase: 73 U/L (ref 39–117)
Bilirubin, Direct: 0.1 mg/dL (ref 0.0–0.3)
Total Bilirubin: 0.5 mg/dL (ref 0.2–1.2)
Total Protein: 6.4 g/dL (ref 6.0–8.3)

## 2020-06-02 LAB — LIPID PANEL
Cholesterol: 110 mg/dL (ref 0–200)
HDL: 48.5 mg/dL (ref >39.00)
LDL Cholesterol: 48 mg/dL (ref 0–99)
NonHDL: 61.54
Total CHOL/HDL Ratio: 2
Triglycerides: 70 mg/dL (ref 0.0–149.0)
VLDL: 14 mg/dL (ref 0.0–40.0)

## 2020-06-02 LAB — MICROALBUMIN / CREATININE URINE RATIO
Creatinine,U: 80.8 mg/dL
Microalb Creat Ratio: 1.7 mg/g (ref 0.0–30.0)
Microalb, Ur: 1.4 mg/dL (ref 0.0–1.9)

## 2020-06-02 LAB — TSH: TSH: 6.04 u[IU]/mL — ABNORMAL HIGH (ref 0.35–4.50)

## 2020-06-02 LAB — HEMOGLOBIN A1C: Hgb A1c MFr Bld: 8.5 % — ABNORMAL HIGH (ref 4.6–6.5)

## 2020-06-04 ENCOUNTER — Other Ambulatory Visit: Payer: Self-pay

## 2020-06-04 ENCOUNTER — Ambulatory Visit (INDEPENDENT_AMBULATORY_CARE_PROVIDER_SITE_OTHER): Payer: PPO | Admitting: Internal Medicine

## 2020-06-04 ENCOUNTER — Encounter: Payer: Self-pay | Admitting: Internal Medicine

## 2020-06-04 ENCOUNTER — Ambulatory Visit: Payer: PPO | Admitting: Internal Medicine

## 2020-06-04 DIAGNOSIS — E7800 Pure hypercholesterolemia, unspecified: Secondary | ICD-10-CM

## 2020-06-04 DIAGNOSIS — C8599 Non-Hodgkin lymphoma, unspecified, extranodal and solid organ sites: Secondary | ICD-10-CM | POA: Diagnosis not present

## 2020-06-04 DIAGNOSIS — E038 Other specified hypothyroidism: Secondary | ICD-10-CM

## 2020-06-04 DIAGNOSIS — E1065 Type 1 diabetes mellitus with hyperglycemia: Secondary | ICD-10-CM

## 2020-06-04 DIAGNOSIS — D649 Anemia, unspecified: Secondary | ICD-10-CM

## 2020-06-04 DIAGNOSIS — I10 Essential (primary) hypertension: Secondary | ICD-10-CM

## 2020-06-04 DIAGNOSIS — I779 Disorder of arteries and arterioles, unspecified: Secondary | ICD-10-CM

## 2020-06-04 DIAGNOSIS — K219 Gastro-esophageal reflux disease without esophagitis: Secondary | ICD-10-CM

## 2020-06-04 MED ORDER — LEVOTHYROXINE SODIUM 175 MCG PO TABS: 175.0000 ug | ORAL_TABLET | Freq: Every day | ORAL | 1 refills | Status: DC

## 2020-06-04 NOTE — Progress Notes (Signed)
Patient ID: Marissa Huff, female   DOB: 11-Mar-1931, 87 y.o.   MRN: [id number]   Subjective:    Patient ID: Marissa Huff, female    DOB: 1931/11/22, 87 y.o.   MRN: [id number]  HPI This visit occurred during the SARS-CoV-2 public health emergency.  Safety protocols were in place, including screening questions prior to the visit, additional usage of staff PPE, and extensive cleaning of exam room while observing appropriate contact time as indicated for disinfecting solutions.  Patient here for a scheduled follow up. She is here to follow up regarding her blood pressure, diabetes and cholesterol.  Seeing Dr Honor Junes.  Last evaluated in 04/2020.  Lantus increased.  Sugars elevated.  Discussed diet and exercise.  She is eating.  Describes loud belching at times.  No chest pain.  Breathing stable.  No increased cough or congestion.  No acid reflux reported.  No abdominal pain.  Bowels moving.  Discussed labs.  Change synthroid.  Chronic back pain. Seeing [facility].  Off tramadol.  Taking tylenol arthritis.  Seeing [facility] - 02/2020 - 40-59% right ICA and -39% left ICA.  Recommended f/u in one year.     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[place])    Gastroparesis    GERD (gastroesophageal reflux disease)    Hypertension    Hypothyroidism    PUD (peptic ulcer disease)    Skin cancer    Spinal stenosis    lumbar laminectomy   Past Surgical History:  Procedure Laterality Date   ABDOMINAL HYSTERECTOMY     antrectomy and vagotomy  1994   arthroscopic left knee  12/05/08   BACK SURGERY     carpal tunnel repai  10/10   left    CATARACT EXTRACTION, BILATERAL     EYE SURGERY     bilateral cataracts   gastric lymphoma  1996   surgery   LAPAROSCOPIC CHOLECYSTECTOMY  6/01   LUMBAR LAMINECTOMY  1999   LUMBAR LAMINECTOMY WITH COFLEX 1 LEVEL N/A 09/15/2015    Procedure: Lumbar one-two Laminectomy with placement of coflex;  Surgeon: Kristeen Miss, MD;  Location: [facility];  Service: Neurosurgery;  Laterality: N/A;   PARTIAL HYSTERECTOMY  1960   fibroid tumors, ovaries not removed   tonsillectomy     TONSILLECTOMY     Family History  Problem Relation Age of Onset   Stroke Mother    Stroke Father    Hypertension Father    Diabetes Brother    Multiple sclerosis Brother    Breast cancer Daughter 79   Colon cancer Neg Hx    Social History   Socioeconomic History   Marital status: Married    Spouse name: Not on file   Number of children: Not on file   Years of education: Not on file   Highest education level: Not on file  Occupational History   Not on file  Tobacco Use   Smoking status: Never   Smokeless tobacco: Never  Vaping Use   Vaping Use: Never used  Substance and Sexual Activity   Alcohol use: No    Alcohol/week: 0.0 standard drinks   Drug use: No   Sexual activity: Not Currently    Birth control/protection: None, Post-menopausal  Other Topics Concern   Not on file  Social History Narrative   Not  on file   Social Determinants of Health   Financial Resource Strain: Low Risk    Difficulty of Paying Living Expenses: Not hard at all  Food Insecurity: No Food Insecurity   Worried About Charity fundraiser in the Last Year: Never true   Arboriculturist in the Last Year: Never true  Transportation Needs: No Transportation Needs   Lack of Transportation (Medical): No   Lack of Transportation (Non-Medical): No  Physical Activity: Not on file  Stress: No Stress Concern Present   Feeling of Stress : Not at all  Social Connections: Socially Integrated   Frequency of Communication with Friends and Family: More than three times a week   Frequency of Social Gatherings with Friends and Family: More than three times a week   Attends Religious Services: More than 4 times per year   Active Member of [employer] or Organizations: Yes    Attends Archivist Meetings: Not on file   Marital Status: Married    Outpatient Encounter Medications as of 06/04/2020  Medication Sig   albuterol (VENTOLIN HFA) 108 (90 Base) MCG/ACT inhaler Inhale 2 puffs into the lungs every 6 (six) hours as needed for wheezing or shortness of breath.   amLODipine (NORVASC) 5 MG tablet TAKE 1 TABLET BY MOUTH EVERY DAY   aspirin 81 MG tablet Take 81 mg by mouth daily.   BD PEN NEEDLE NANO U/F 32G X 4 MM MISC USE 1 EACH 4 (FOUR) TIMES DAILY   Calcium Citrate-Vitamin D (CALCIUM + D PO) Take 1 tablet by mouth daily.   Continuous Blood Gluc Receiver (FREESTYLE LIBRE 14 DAY READER) DEVI Use 1 each as needed .  Use to test blood sugar   Continuous Blood Gluc Sensor (FREESTYLE LIBRE 14 DAY SENSOR) MISC Use 1 each every 14 (fourteen) days   doxylamine, Sleep, (UNISOM) 25 MG tablet Take 1 tablet by mouth at bedtime as needed.   fexofenadine (ALLEGRA) 180 MG tablet Take 180 mg by mouth daily.   fluticasone (FLONASE) 50 MCG/ACT nasal spray SPRAY 2 SPRAYS INTO EACH NOSTRIL EVERY DAY   glucagon (GLUCAGON EMERGENCY) 1 MG injection Inject 1 mg into the muscle once as needed.   glucose blood test strip    insulin aspart (NOVOLOG FLEXPEN) 100 UNIT/ML FlexPen Inject 10 Units into the skin in the morning, at noon, and at bedtime.   ipratropium (ATROVENT) 0.06 % nasal spray PLACE 2 SPRAYS INTO BOTH NOSTRILS 4 (FOUR) TIMES DAILY AS NEEDED FOR RHINITIS.   LANTUS SOLOSTAR 100 UNIT/ML Solostar Pen Inject 28-30 Units into the skin at bedtime.   levothyroxine (SYNTHROID) 175 MCG tablet Take 1 tablet (175 mcg total) by mouth daily.   LORazepam (ATIVAN) 0.5 MG tablet Take 0.5-1 tablets (0.25-0.5 mg total) by mouth daily as needed for anxiety.   metFORMIN (GLUCOPHAGE-XR) 500 MG 24 hr tablet TAKE 1 TABLET BY MOUTH TWICE A DAY   Multiple Vitamins-Minerals (MULTIVITAMIN PO) Take 1 tablet by mouth daily.    Multiple Vitamins-Minerals (PRESERVISION AREDS PO) Take 1 capsule by  mouth 2 (two) times daily.   pantoprazole (PROTONIX) 40 MG tablet TAKE 1 TABLET BY MOUTH TWICE A DAY   rosuvastatin (CRESTOR) 5 MG tablet TAKE 1 TABLET (5 MG TOTAL) BY MOUTH 3 (THREE) TIMES A WEEK.   valsartan-hydrochlorothiazide (DIOVAN-HCT) 160-12.5 MG tablet TAKE 1 TABLET BY MOUTH EVERY DAY   [DISCONTINUED] glucagon 1 MG injection Inject into the muscle.   [DISCONTINUED] levothyroxine (SYNTHROID) 150 MCG tablet TAKE 1 TABLET  BY MOUTH EVERY DAY   [DISCONTINUED] Insulin Aspart Prot & Aspart (NOVOLOG MIX 70/30 [place]) Inject 11 Units into the skin 3 (three) times daily with meals. (Patient not taking: Reported on 06/04/2020)   No facility-administered encounter medications on file as of 06/04/2020.    Review of Systems  Constitutional:  Negative for appetite change and unexpected weight change.  HENT:  Negative for congestion and sinus pressure.   Respiratory:  Negative for cough and chest tightness.        Breathing stable.   Cardiovascular:  Negative for chest pain and palpitations.  Gastrointestinal:  Negative for abdominal pain, diarrhea, nausea and vomiting.  Genitourinary:  Negative for difficulty urinating and dysuria.  Musculoskeletal:  Negative for joint swelling and myalgias.  Skin:  Negative for color change and rash.  Neurological:  Negative for dizziness, light-headedness and headaches.  Psychiatric/Behavioral:  Negative for agitation and dysphoric mood.       Objective:    Physical Exam Vitals reviewed.  Constitutional:      General: She is not in acute distress.    Appearance: Normal appearance.  HENT:     Head: Normocephalic and atraumatic.     Right Ear: External ear normal.     Left Ear: External ear normal.  Eyes:     General: No scleral icterus.       Right eye: No discharge.        Left eye: No discharge.     Conjunctiva/sclera: Conjunctivae normal.  Neck:     Thyroid: No thyromegaly.  Cardiovascular:     Rate and Rhythm: Normal rate and regular rhythm.   Pulmonary:     Effort: No respiratory distress.     Breath sounds: Normal breath sounds. No wheezing.  Abdominal:     General: Bowel sounds are normal.     Palpations: Abdomen is soft.     Tenderness: There is no abdominal tenderness.  Musculoskeletal:        General: No swelling or tenderness.     Cervical back: Neck supple. No tenderness.  Lymphadenopathy:     Cervical: No cervical adenopathy.  Skin:    Findings: No erythema or rash.  Neurological:     Mental Status: She is alert.  Psychiatric:        Mood and Affect: Mood normal.        Behavior: Behavior normal.    BP (!) 120/52 (BP Location: Right Arm, Patient Position: Sitting, Cuff Size: Large)   Pulse 76   Temp 98.2 F (36.8 C) (Oral)   Ht 5' 3"  (1.6 m)   Wt 185 lb 12.8 oz (84.3 kg)   SpO2 97%   BMI 32.91 kg/m  Wt Readings from Last 3 Encounters:  06/04/20 185 lb 12.8 oz (84.3 kg)  03/19/20 182 lb (82.6 kg)  02/10/20 185 lb (83.9 kg)     Lab Results  Component Value Date   WBC 4.9 09/19/2019   HGB 12.2 09/19/2019   HCT 36.2 09/19/2019   PLT 197.0 09/19/2019   GLUCOSE 224 (H) 06/02/2020   CHOL 110 06/02/2020   TRIG 70.0 06/02/2020   HDL 48.50 06/02/2020   LDLCALC 48 06/02/2020   ALT 18 06/02/2020   AST 16 06/02/2020   NA 140 06/02/2020   K 4.2 06/02/2020   CL 104 06/02/2020   CREATININE 0.82 06/02/2020   BUN 17 06/02/2020   CO2 27 06/02/2020   TSH 6.04 (H) 06/02/2020   HGBA1C 8.5 (H) 06/02/2020   MICROALBUR  1.4 06/02/2020    CT Head Wo Contrast  Result Date: 03/19/2020 CLINICAL DATA:  Facial trauma. Head trauma, minor. Neck trauma. Additional history provided: Fall at home, patient reports face and knee pain. EXAM: CT HEAD WITHOUT CONTRAST CT MAXILLOFACIAL WITHOUT CONTRAST CT CERVICAL SPINE WITHOUT CONTRAST TECHNIQUE: Multidetector CT imaging of the head, cervical spine, and maxillofacial structures were performed using the standard protocol without intravenous contrast. Multiplanar CT image  reconstructions of the cervical spine and maxillofacial structures were also generated. COMPARISON:  Brain MRI 03/27/2015. FINDINGS: CT HEAD FINDINGS Brain: Mild generalized cerebral atrophy. Mild for age ill-defined hypoattenuation within the cerebral white matter is nonspecific, but compatible with chronic small vessel ischemic disease. There is no acute intracranial hemorrhage. No demarcated cortical infarct. No extra-axial fluid collection. No evidence of intracranial mass. No midline shift. Vascular: No hyperdense vessel.  Atherosclerotic calcifications. Skull: Normal. Negative for fracture or focal lesion. Other: No significant mastoid effusion. CT MAXILLOFACIAL FINDINGS Osseous: Acute comminuted and mildly displaced bilateral nasal bone fractures. Additionally, an acute mildly displaced fracture of the bony nasal septum is suspected superiorly (for instance as seen on series 7, image 16) (series 3, images 27 and 28). No other acute maxillofacial fracture is identified. Orbits: No acute finding within the orbits. The globes are normal in size and contour. The extraocular muscles and optic nerve sheath complexes are symmetric and unremarkable. No evidence of retrobulbar hematoma. Sinuses: Trace bilateral ethmoid sinus mucosal thickening. Small fluid level and small volume frothy secretions within the right sphenoid sinus. Trace mucosal thickening within the right maxillary sinus inferiorly. Small right ethmoid sinus osteoma. Soft tissues: Prominent left maxillofacial soft tissue swelling. Nasal soft tissue swelling is also noted. CT CERVICAL SPINE FINDINGS Mildly motion degraded exam. Alignment: Straightening of the expected cervical lordosis. 2 mm C4-C5 grade 1 anterolisthesis. Skull base and vertebrae: The basion-dental and atlanto-dental intervals are maintained.No evidence of acute fracture to the cervical spine. Soft tissues and spinal canal: No prevertebral fluid or swelling. No visible canal hematoma.  Disc levels: Cervical and upper thoracic spondylosis with multilevel disc space narrowing, disc bulges, uncovertebral hypertrophy and facet arthrosis. Disc space narrowing is greatest at C3-C4 (moderate/advanced), C5-C6 (moderate) and T2-T3 (severe). No appreciable high-grade spinal canal stenosis. Multilevel bony neural foraminal narrowing. Upper chest: No consolidation within the imaged lung apices. No visible pneumothorax. IMPRESSION: CT head: 1. No evidence of acute intracranial abnormality. 2. Mild cerebral atrophy and chronic small vessel ischemic disease. CT maxillofacial: 1. Acute comminuted and mildly displaced bilateral nasal bone fractures. 2. Suspect additional acute mildly displaced fracture of the bony nasal septum superiorly. 3. Prominent left maxillofacial soft tissue swelling. 4. Nasal soft tissue swelling. 5. Paranasal sinus disease as described. Most notably, there is small volume fluid within the right sphenoid sinus. CT cervical spine: 1. Mildly motion degraded exam. 2. No evidence of acute fracture to the cervical spine. 3. 2 mm C4-C5 grade 1 anterolisthesis. 4. Cervical and upper thoracic spondylosis as described. Electronically Signed   By: Kellie Simmering DO   On: 03/19/2020 17:27   CT Cervical Spine Wo Contrast  Result Date: 03/19/2020 CLINICAL DATA:  Facial trauma. Head trauma, minor. Neck trauma. Additional history provided: Fall at home, patient reports face and knee pain. EXAM: CT HEAD WITHOUT CONTRAST CT MAXILLOFACIAL WITHOUT CONTRAST CT CERVICAL SPINE WITHOUT CONTRAST TECHNIQUE: Multidetector CT imaging of the head, cervical spine, and maxillofacial structures were performed using the standard protocol without intravenous contrast. Multiplanar CT image reconstructions of the cervical spine and  maxillofacial structures were also generated. COMPARISON:  Brain MRI 03/27/2015. FINDINGS: CT HEAD FINDINGS Brain: Mild generalized cerebral atrophy. Mild for age ill-defined hypoattenuation  within the cerebral white matter is nonspecific, but compatible with chronic small vessel ischemic disease. There is no acute intracranial hemorrhage. No demarcated cortical infarct. No extra-axial fluid collection. No evidence of intracranial mass. No midline shift. Vascular: No hyperdense vessel.  Atherosclerotic calcifications. Skull: Normal. Negative for fracture or focal lesion. Other: No significant mastoid effusion. CT MAXILLOFACIAL FINDINGS Osseous: Acute comminuted and mildly displaced bilateral nasal bone fractures. Additionally, an acute mildly displaced fracture of the bony nasal septum is suspected superiorly (for instance as seen on series 7, image 16) (series 3, images 27 and 28). No other acute maxillofacial fracture is identified. Orbits: No acute finding within the orbits. The globes are normal in size and contour. The extraocular muscles and optic nerve sheath complexes are symmetric and unremarkable. No evidence of retrobulbar hematoma. Sinuses: Trace bilateral ethmoid sinus mucosal thickening. Small fluid level and small volume frothy secretions within the right sphenoid sinus. Trace mucosal thickening within the right maxillary sinus inferiorly. Small right ethmoid sinus osteoma. Soft tissues: Prominent left maxillofacial soft tissue swelling. Nasal soft tissue swelling is also noted. CT CERVICAL SPINE FINDINGS Mildly motion degraded exam. Alignment: Straightening of the expected cervical lordosis. 2 mm C4-C5 grade 1 anterolisthesis. Skull base and vertebrae: The basion-dental and atlanto-dental intervals are maintained.No evidence of acute fracture to the cervical spine. Soft tissues and spinal canal: No prevertebral fluid or swelling. No visible canal hematoma. Disc levels: Cervical and upper thoracic spondylosis with multilevel disc space narrowing, disc bulges, uncovertebral hypertrophy and facet arthrosis. Disc space narrowing is greatest at C3-C4 (moderate/advanced), C5-C6 (moderate) and  T2-T3 (severe). No appreciable high-grade spinal canal stenosis. Multilevel bony neural foraminal narrowing. Upper chest: No consolidation within the imaged lung apices. No visible pneumothorax. IMPRESSION: CT head: 1. No evidence of acute intracranial abnormality. 2. Mild cerebral atrophy and chronic small vessel ischemic disease. CT maxillofacial: 1. Acute comminuted and mildly displaced bilateral nasal bone fractures. 2. Suspect additional acute mildly displaced fracture of the bony nasal septum superiorly. 3. Prominent left maxillofacial soft tissue swelling. 4. Nasal soft tissue swelling. 5. Paranasal sinus disease as described. Most notably, there is small volume fluid within the right sphenoid sinus. CT cervical spine: 1. Mildly motion degraded exam. 2. No evidence of acute fracture to the cervical spine. 3. 2 mm C4-C5 grade 1 anterolisthesis. 4. Cervical and upper thoracic spondylosis as described. Electronically Signed   By: Kellie Simmering DO   On: 03/19/2020 17:27   DG Knee Complete 4 Views Left  Result Date: 03/19/2020 CLINICAL DATA:  Left knee pain after fall. EXAM: LEFT KNEE - COMPLETE 4+ VIEW COMPARISON:  April 25, 2010. FINDINGS: No evidence of fracture, dislocation, or joint effusion. No significant disc space narrowing is noted. Chondrocalcinosis is noted medially and laterally suggesting calcium pyrophosphate deposition disease or early degenerative joint disease. Soft tissues are unremarkable. IMPRESSION: Chondrocalcinosis is noted medially and laterally suggesting calcium pyrophosphate deposition disease or early degenerative joint disease. No acute abnormality seen in the left knee. Electronically Signed   By: Marijo Conception M.D.   On: 03/19/2020 17:13   CT Maxillofacial Wo Contrast  Result Date: 03/19/2020 CLINICAL DATA:  Facial trauma. Head trauma, minor. Neck trauma. Additional history provided: Fall at home, patient reports face and knee pain. EXAM: CT HEAD WITHOUT CONTRAST CT  MAXILLOFACIAL WITHOUT CONTRAST CT CERVICAL SPINE WITHOUT CONTRAST TECHNIQUE:  Multidetector CT imaging of the head, cervical spine, and maxillofacial structures were performed using the standard protocol without intravenous contrast. Multiplanar CT image reconstructions of the cervical spine and maxillofacial structures were also generated. COMPARISON:  Brain MRI 03/27/2015. FINDINGS: CT HEAD FINDINGS Brain: Mild generalized cerebral atrophy. Mild for age ill-defined hypoattenuation within the cerebral white matter is nonspecific, but compatible with chronic small vessel ischemic disease. There is no acute intracranial hemorrhage. No demarcated cortical infarct. No extra-axial fluid collection. No evidence of intracranial mass. No midline shift. Vascular: No hyperdense vessel.  Atherosclerotic calcifications. Skull: Normal. Negative for fracture or focal lesion. Other: No significant mastoid effusion. CT MAXILLOFACIAL FINDINGS Osseous: Acute comminuted and mildly displaced bilateral nasal bone fractures. Additionally, an acute mildly displaced fracture of the bony nasal septum is suspected superiorly (for instance as seen on series 7, image 16) (series 3, images 27 and 28). No other acute maxillofacial fracture is identified. Orbits: No acute finding within the orbits. The globes are normal in size and contour. The extraocular muscles and optic nerve sheath complexes are symmetric and unremarkable. No evidence of retrobulbar hematoma. Sinuses: Trace bilateral ethmoid sinus mucosal thickening. Small fluid level and small volume frothy secretions within the right sphenoid sinus. Trace mucosal thickening within the right maxillary sinus inferiorly. Small right ethmoid sinus osteoma. Soft tissues: Prominent left maxillofacial soft tissue swelling. Nasal soft tissue swelling is also noted. CT CERVICAL SPINE FINDINGS Mildly motion degraded exam. Alignment: Straightening of the expected cervical lordosis. 2 mm C4-C5 grade 1  anterolisthesis. Skull base and vertebrae: The basion-dental and atlanto-dental intervals are maintained.No evidence of acute fracture to the cervical spine. Soft tissues and spinal canal: No prevertebral fluid or swelling. No visible canal hematoma. Disc levels: Cervical and upper thoracic spondylosis with multilevel disc space narrowing, disc bulges, uncovertebral hypertrophy and facet arthrosis. Disc space narrowing is greatest at C3-C4 (moderate/advanced), C5-C6 (moderate) and T2-T3 (severe). No appreciable high-grade spinal canal stenosis. Multilevel bony neural foraminal narrowing. Upper chest: No consolidation within the imaged lung apices. No visible pneumothorax. IMPRESSION: CT head: 1. No evidence of acute intracranial abnormality. 2. Mild cerebral atrophy and chronic small vessel ischemic disease. CT maxillofacial: 1. Acute comminuted and mildly displaced bilateral nasal bone fractures. 2. Suspect additional acute mildly displaced fracture of the bony nasal septum superiorly. 3. Prominent left maxillofacial soft tissue swelling. 4. Nasal soft tissue swelling. 5. Paranasal sinus disease as described. Most notably, there is small volume fluid within the right sphenoid sinus. CT cervical spine: 1. Mildly motion degraded exam. 2. No evidence of acute fracture to the cervical spine. 3. 2 mm C4-C5 grade 1 anterolisthesis. 4. Cervical and upper thoracic spondylosis as described. Electronically Signed   By: Kellie Simmering DO   On: 03/19/2020 17:27       Assessment & Plan:   Problem List Items Addressed This Visit     Anemia    Follow cbc.        Carotid artery disease, unspecified laterality ([place])    Has seen [facility].  Carotid ultrasound as outlined.  Stable.  Continue crestor.         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  Doing well.  Followed by GI.        GERD (gastroesophageal reflux disease)    Upper symptoms controlled. On protonix.         Hypercholesterolemia     Continue crestor.  Low cholesterol diet and exercise.  Follow lipid panel and liver function  tests.         Hypertension    Continue valsartan/hctz and amlodipine.  Blood pressure as outlined.  Continue current medications.  Follow pressures.  Follow metabolic panel.        Hypothyroidism    On thyroid replacement.  TSH elevated.  Increased synthroid to 140mg q day.  Check tsh in 6-8 weeks.         Relevant Medications   levothyroxine (SYNTHROID) 175 MCG tablet   Other Relevant Orders   TSH   Type 1 diabetes mellitus with hyperglycemia (HCC)    Last a1c 8.3.  Insulin being adjusted.  Low carb diet and exercise.  Follow met b and a1c.        Relevant Medications   insulin aspart (NOVOLOG FLEXPEN) 100 UNIT/ML FlexPen     CEinar Pheasant MD

## 2020-06-10 DIAGNOSIS — B9689 Other specified bacterial agents as the cause of diseases classified elsewhere: Secondary | ICD-10-CM | POA: Diagnosis not present

## 2020-06-10 DIAGNOSIS — J209 Acute bronchitis, unspecified: Secondary | ICD-10-CM | POA: Diagnosis not present

## 2020-06-10 DIAGNOSIS — J301 Allergic rhinitis due to pollen: Secondary | ICD-10-CM | POA: Diagnosis not present

## 2020-06-10 DIAGNOSIS — J019 Acute sinusitis, unspecified: Secondary | ICD-10-CM | POA: Diagnosis not present

## 2020-06-12 ENCOUNTER — Encounter: Payer: Self-pay | Admitting: Internal Medicine

## 2020-06-12 DIAGNOSIS — C8599 Non-Hodgkin lymphoma, unspecified, extranodal and solid organ sites: Secondary | ICD-10-CM | POA: Insufficient documentation

## 2020-06-12 NOTE — Assessment & Plan Note (Signed)
Has seen [facility].  Carotid ultrasound as outlined.  Stable.  Continue crestor.

## 2020-06-12 NOTE — Assessment & Plan Note (Signed)
Upper symptoms controlled.  On protonix.  

## 2020-06-12 NOTE — Assessment & Plan Note (Signed)
Follow cbc.  

## 2020-06-12 NOTE — Assessment & Plan Note (Signed)
Continue valsartan/hctz and amlodipine.  Blood pressure as outlined.  Continue current medications.  Follow pressures.  Follow metabolic panel.

## 2020-06-12 NOTE — Assessment & Plan Note (Signed)
History of gastric lymphoma.  Doing well.  Followed by GI.

## 2020-06-12 NOTE — Assessment & Plan Note (Addendum)
On thyroid replacement.  TSH elevated.  Increased synthroid to 141mcg q day.  Check tsh in 6-8 weeks.

## 2020-06-12 NOTE — Assessment & Plan Note (Signed)
Last a1c 8.3.  Insulin being adjusted.  Low carb diet and exercise.  Follow met b and a1c.

## 2020-06-12 NOTE — Assessment & Plan Note (Signed)
Continue crestor.  Low cholesterol diet and exercise. Follow lipid panel and liver function tests.   

## 2020-06-13 ENCOUNTER — Other Ambulatory Visit: Payer: Self-pay | Admitting: Internal Medicine

## 2020-06-25 ENCOUNTER — Ambulatory Visit: Payer: Self-pay | Admitting: Urology

## 2020-06-29 DIAGNOSIS — J301 Allergic rhinitis due to pollen: Secondary | ICD-10-CM | POA: Diagnosis not present

## 2020-07-07 DIAGNOSIS — J301 Allergic rhinitis due to pollen: Secondary | ICD-10-CM | POA: Diagnosis not present

## 2020-07-13 DIAGNOSIS — J301 Allergic rhinitis due to pollen: Secondary | ICD-10-CM | POA: Diagnosis not present

## 2020-07-14 DIAGNOSIS — M1731 Unilateral post-traumatic osteoarthritis, right knee: Secondary | ICD-10-CM | POA: Diagnosis not present

## 2020-07-14 DIAGNOSIS — M25562 Pain in left knee: Secondary | ICD-10-CM | POA: Diagnosis not present

## 2020-07-16 ENCOUNTER — Other Ambulatory Visit (INDEPENDENT_AMBULATORY_CARE_PROVIDER_SITE_OTHER): Payer: PPO

## 2020-07-16 ENCOUNTER — Other Ambulatory Visit: Payer: Self-pay

## 2020-07-16 DIAGNOSIS — E038 Other specified hypothyroidism: Secondary | ICD-10-CM | POA: Diagnosis not present

## 2020-07-16 LAB — TSH: TSH: 2.34 u[IU]/mL (ref 0.35–5.50)

## 2020-07-17 ENCOUNTER — Other Ambulatory Visit: Payer: Self-pay | Admitting: Internal Medicine

## 2020-07-17 DIAGNOSIS — Z1231 Encounter for screening mammogram for malignant neoplasm of breast: Secondary | ICD-10-CM

## 2020-07-21 DIAGNOSIS — J301 Allergic rhinitis due to pollen: Secondary | ICD-10-CM | POA: Diagnosis not present

## 2020-07-27 DIAGNOSIS — J301 Allergic rhinitis due to pollen: Secondary | ICD-10-CM | POA: Diagnosis not present

## 2020-07-29 ENCOUNTER — Ambulatory Visit
Admission: RE | Admit: 2020-07-29 | Discharge: 2020-07-29 | Disposition: A | Discharge status: Home / Self Care | Payer: PPO | Source: Ambulatory Visit | Attending: Internal Medicine | Admitting: Internal Medicine

## 2020-07-29 ENCOUNTER — Other Ambulatory Visit: Payer: Self-pay

## 2020-07-29 DIAGNOSIS — Z1231 Encounter for screening mammogram for malignant neoplasm of breast: Secondary | ICD-10-CM | POA: Diagnosis not present

## 2020-08-09 ENCOUNTER — Other Ambulatory Visit: Payer: Self-pay | Admitting: Internal Medicine

## 2020-08-10 DIAGNOSIS — J301 Allergic rhinitis due to pollen: Secondary | ICD-10-CM | POA: Diagnosis not present

## 2020-08-14 DIAGNOSIS — I152 Hypertension secondary to endocrine disorders: Secondary | ICD-10-CM | POA: Diagnosis not present

## 2020-08-14 DIAGNOSIS — R809 Proteinuria, unspecified: Secondary | ICD-10-CM | POA: Diagnosis not present

## 2020-08-14 DIAGNOSIS — E039 Hypothyroidism, unspecified: Secondary | ICD-10-CM | POA: Diagnosis not present

## 2020-08-14 DIAGNOSIS — E1159 Type 2 diabetes mellitus with other circulatory complications: Secondary | ICD-10-CM | POA: Diagnosis not present

## 2020-08-14 DIAGNOSIS — E1029 Type 1 diabetes mellitus with other diabetic kidney complication: Secondary | ICD-10-CM | POA: Diagnosis not present

## 2020-08-17 DIAGNOSIS — J301 Allergic rhinitis due to pollen: Secondary | ICD-10-CM | POA: Diagnosis not present

## 2020-08-24 DIAGNOSIS — J301 Allergic rhinitis due to pollen: Secondary | ICD-10-CM | POA: Diagnosis not present

## 2020-09-01 DIAGNOSIS — J301 Allergic rhinitis due to pollen: Secondary | ICD-10-CM | POA: Diagnosis not present

## 2020-09-02 DIAGNOSIS — J301 Allergic rhinitis due to pollen: Secondary | ICD-10-CM | POA: Diagnosis not present

## 2020-09-08 DIAGNOSIS — J301 Allergic rhinitis due to pollen: Secondary | ICD-10-CM | POA: Diagnosis not present

## 2020-09-14 DIAGNOSIS — J301 Allergic rhinitis due to pollen: Secondary | ICD-10-CM | POA: Diagnosis not present

## 2020-09-21 DIAGNOSIS — J301 Allergic rhinitis due to pollen: Secondary | ICD-10-CM | POA: Diagnosis not present

## 2020-09-28 DIAGNOSIS — J301 Allergic rhinitis due to pollen: Secondary | ICD-10-CM | POA: Diagnosis not present

## 2020-10-05 DIAGNOSIS — H353132 Nonexudative age-related macular degeneration, bilateral, intermediate dry stage: Secondary | ICD-10-CM | POA: Diagnosis not present

## 2020-10-05 DIAGNOSIS — J301 Allergic rhinitis due to pollen: Secondary | ICD-10-CM | POA: Diagnosis not present

## 2020-10-05 LAB — HM DIABETES EYE EXAM

## 2020-10-07 ENCOUNTER — Encounter: Payer: Self-pay | Admitting: Internal Medicine

## 2020-10-07 ENCOUNTER — Other Ambulatory Visit: Payer: Self-pay

## 2020-10-07 ENCOUNTER — Telehealth: Payer: Self-pay | Admitting: Internal Medicine

## 2020-10-07 ENCOUNTER — Ambulatory Visit (INDEPENDENT_AMBULATORY_CARE_PROVIDER_SITE_OTHER): Payer: PPO | Admitting: Internal Medicine

## 2020-10-07 VITALS — BP 130/70 | HR 70 | Temp 98.0°F | Resp 16 | Ht 64.0 in | Wt 184.6 lb

## 2020-10-07 DIAGNOSIS — E7800 Pure hypercholesterolemia, unspecified: Secondary | ICD-10-CM

## 2020-10-07 DIAGNOSIS — I779 Disorder of arteries and arterioles, unspecified: Secondary | ICD-10-CM | POA: Diagnosis not present

## 2020-10-07 DIAGNOSIS — E038 Other specified hypothyroidism: Secondary | ICD-10-CM | POA: Diagnosis not present

## 2020-10-07 DIAGNOSIS — I10 Essential (primary) hypertension: Secondary | ICD-10-CM | POA: Diagnosis not present

## 2020-10-07 DIAGNOSIS — E1065 Type 1 diabetes mellitus with hyperglycemia: Secondary | ICD-10-CM

## 2020-10-07 DIAGNOSIS — C8599 Non-Hodgkin lymphoma, unspecified, extranodal and solid organ sites: Secondary | ICD-10-CM

## 2020-10-07 DIAGNOSIS — Z23 Encounter for immunization: Secondary | ICD-10-CM

## 2020-10-07 DIAGNOSIS — K219 Gastro-esophageal reflux disease without esophagitis: Secondary | ICD-10-CM

## 2020-10-07 DIAGNOSIS — K5900 Constipation, unspecified: Secondary | ICD-10-CM | POA: Diagnosis not present

## 2020-10-07 DIAGNOSIS — W19XXXD Unspecified fall, subsequent encounter: Secondary | ICD-10-CM

## 2020-10-07 DIAGNOSIS — M4800 Spinal stenosis, site unspecified: Secondary | ICD-10-CM | POA: Diagnosis not present

## 2020-10-07 DIAGNOSIS — D649 Anemia, unspecified: Secondary | ICD-10-CM

## 2020-10-07 NOTE — Telephone Encounter (Signed)
Patient scheduled for labs as requested by check out note.   Needing orders placed.

## 2020-10-07 NOTE — Progress Notes (Signed)
Patient ID: Marissa Huff, female   DOB: 05/23/1931, 87 y.o.   MRN: [id number]   Subjective:    Patient ID: Marissa Huff, female    DOB: 08/23/1931, 87 y.o.   MRN: [id number]  This visit occurred during the SARS-CoV-2 public health emergency.  Safety protocols were in place, including screening questions prior to the visit, additional usage of staff PPE, and extensive cleaning of exam room while observing appropriate contact time as indicated for disinfecting solutions.   Patient here for a scheduled follow up.   Chief Complaint  Patient presents with   Diabetes   Hypertension   Hyperlipidemia   .   HPI Reports she is doing relatively well.  States am sugars averaging low 100s. Before bed 200 range.  Discussed diet and exercise.  No chest pain or sob reported.  No abdominal pain. Taking miralax.  Did have a fall recently.  Foot - caught.  Hit her face.  Evaluated.  States broke two bones in her nose.  Saw ENT.  No headache.  No dizziness.  F/u with ortho regarding persistent knee issue.     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[place])    Gastroparesis    GERD (gastroesophageal reflux disease)    Hypertension    Hypothyroidism    PUD (peptic ulcer disease)    Skin cancer    Spinal stenosis    lumbar laminectomy   Past Surgical History:  Procedure Laterality Date   ABDOMINAL HYSTERECTOMY     antrectomy and vagotomy  1994   arthroscopic left knee  12/05/08   BACK SURGERY     carpal tunnel repai  10/10   left    CATARACT EXTRACTION, BILATERAL     EYE SURGERY     bilateral cataracts   gastric lymphoma  1996   surgery   LAPAROSCOPIC CHOLECYSTECTOMY  6/01   LUMBAR LAMINECTOMY  1999   LUMBAR LAMINECTOMY WITH COFLEX 1 LEVEL N/A 09/15/2015   Procedure: Lumbar one-two Laminectomy with placement of coflex;  Surgeon: Kristeen Miss, MD;   Location: [facility];  Service: Neurosurgery;  Laterality: N/A;   PARTIAL HYSTERECTOMY  1960   fibroid tumors, ovaries not removed   tonsillectomy     TONSILLECTOMY     Family History  Problem Relation Age of Onset   Stroke Mother    Stroke Father    Hypertension Father    Diabetes Brother    Multiple sclerosis Brother    Breast cancer Daughter 53   Colon cancer Neg Hx    Social History   Socioeconomic History   Marital status: Married    Spouse name: Not on file   Number of children: Not on file   Years of education: Not on file   Highest education level: Not on file  Occupational History   Not on file  Tobacco Use   Smoking status: Never   Smokeless tobacco: Never  Vaping Use   Vaping Use: Never used  Substance and Sexual Activity   Alcohol use: No    Alcohol/week: 0.0 standard drinks   Drug use: No   Sexual activity: Not Currently    Birth control/protection: None, Post-menopausal  Other Topics Concern   Not on file  Social History Narrative   Not on file   Social Determinants of  Health   Financial Resource Strain: Low Risk    Difficulty of Paying Living Expenses: Not hard at all  Food Insecurity: No Food Insecurity   Worried About [place] in the Last Year: Never true   Ran Out of Food in the Last Year: Never true  Transportation Needs: No Transportation Needs   Lack of Transportation (Medical): No   Lack of Transportation (Non-Medical): No  Physical Activity: Not on file  Stress: No Stress Concern Present   Feeling of Stress : Not at all  Social Connections: Socially Integrated   Frequency of Communication with Friends and Family: More than three times a week   Frequency of Social Gatherings with Friends and Family: More than three times a week   Attends Religious Services: More than 4 times per year   Active Member of [employer] or Organizations: Yes   Attends Archivist Meetings: Not on file   Marital Status: Married     Review  of Systems  Constitutional:  Negative for appetite change and unexpected weight change.  HENT:  Negative for congestion and sinus pressure.   Respiratory:  Negative for cough and chest tightness.        Breathing stable.    Cardiovascular:  Negative for chest pain, palpitations and leg swelling.  Gastrointestinal:  Negative for abdominal pain, diarrhea, nausea and vomiting.  Genitourinary:  Negative for difficulty urinating and dysuria.  Musculoskeletal:  Negative for myalgias.       F/u ortho - knee pain.   Skin:  Negative for color change and rash.  Neurological:  Negative for dizziness, light-headedness and headaches.  Psychiatric/Behavioral:  Negative for agitation and dysphoric mood.       Objective:     BP 130/70   Pulse 70   Temp 98 F (36.7 C)   Resp 16   Ht 5' 4"  (1.626 m)   Wt 184 lb 9.6 oz (83.7 kg)   SpO2 98%   BMI 31.69 kg/m  Wt Readings from Last 3 Encounters:  10/07/20 184 lb 9.6 oz (83.7 kg)  06/04/20 185 lb 12.8 oz (84.3 kg)  03/19/20 182 lb (82.6 kg)    Physical Exam Vitals reviewed.  Constitutional:      General: She is not in acute distress.    Appearance: Normal appearance.  HENT:     Head: Normocephalic and atraumatic.     Right Ear: External ear normal.     Left Ear: External ear normal.  Eyes:     General: No scleral icterus.       Right eye: No discharge.        Left eye: No discharge.     Conjunctiva/sclera: Conjunctivae normal.  Neck:     Thyroid: No thyromegaly.  Cardiovascular:     Rate and Rhythm: Normal rate and regular rhythm.  Pulmonary:     Effort: No respiratory distress.     Breath sounds: Normal breath sounds. No wheezing.  Abdominal:     General: Bowel sounds are normal.     Palpations: Abdomen is soft.     Tenderness: There is no abdominal tenderness.  Musculoskeletal:        General: No swelling or tenderness.     Cervical back: Neck supple. No tenderness.  Lymphadenopathy:     Cervical: No cervical adenopathy.   Skin:    Findings: No erythema or rash.  Neurological:     Mental Status: She is alert.  Psychiatric:  Mood and Affect: Mood normal.        Behavior: Behavior normal.     Outpatient Encounter Medications as of 10/07/2020  Medication Sig   albuterol (VENTOLIN HFA) 108 (90 Base) MCG/ACT inhaler Inhale 2 puffs into the lungs every 6 (six) hours as needed for wheezing or shortness of breath.   amLODipine (NORVASC) 5 MG tablet TAKE 1 TABLET BY MOUTH EVERY DAY   aspirin 81 MG tablet Take 81 mg by mouth daily.   BD PEN NEEDLE NANO U/F 32G X 4 MM MISC USE 1 EACH 4 (FOUR) TIMES DAILY   Calcium Citrate-Vitamin D (CALCIUM + D PO) Take 1 tablet by mouth daily.   Continuous Blood Gluc Receiver (FREESTYLE LIBRE 14 DAY READER) DEVI Use 1 each as needed .  Use to test blood sugar   Continuous Blood Gluc Sensor (FREESTYLE LIBRE 14 DAY SENSOR) MISC Use 1 each every 14 (fourteen) days   doxylamine, Sleep, (UNISOM) 25 MG tablet Take 1 tablet by mouth at bedtime as needed.   fexofenadine (ALLEGRA) 180 MG tablet Take 180 mg by mouth daily.   fluticasone (FLONASE) 50 MCG/ACT nasal spray SPRAY 2 SPRAYS INTO EACH NOSTRIL EVERY DAY   glucagon (GLUCAGON EMERGENCY) 1 MG injection Inject 1 mg into the muscle once as needed.   glucose blood test strip    insulin aspart (NOVOLOG FLEXPEN) 100 UNIT/ML FlexPen Inject 10 Units into the skin in the morning, at noon, and at bedtime.   ipratropium (ATROVENT) 0.06 % nasal spray PLACE 2 SPRAYS INTO BOTH NOSTRILS 4 (FOUR) TIMES DAILY AS NEEDED FOR RHINITIS.   LANTUS SOLOSTAR 100 UNIT/ML Solostar Pen Inject 28-30 Units into the skin at bedtime.   levothyroxine (SYNTHROID) 175 MCG tablet Take 1 tablet (175 mcg total) by mouth daily.   LORazepam (ATIVAN) 0.5 MG tablet Take 0.5-1 tablets (0.25-0.5 mg total) by mouth daily as needed for anxiety.   metFORMIN (GLUCOPHAGE-XR) 500 MG 24 hr tablet TAKE 1 TABLET BY MOUTH TWICE A DAY   Multiple Vitamins-Minerals (MULTIVITAMIN PO)  Take 1 tablet by mouth daily.    Multiple Vitamins-Minerals (PRESERVISION AREDS PO) Take 1 capsule by mouth 2 (two) times daily.   pantoprazole (PROTONIX) 40 MG tablet TAKE 1 TABLET BY MOUTH TWICE A DAY   rosuvastatin (CRESTOR) 5 MG tablet TAKE 1 TABLET BY MOUTH 3 TIMES A WEEK.   valsartan-hydrochlorothiazide (DIOVAN-HCT) 160-12.5 MG tablet TAKE 1 TABLET BY MOUTH EVERY DAY   No facility-administered encounter medications on file as of 10/07/2020.     Lab Results  Component Value Date   WBC 4.9 09/19/2019   HGB 12.2 09/19/2019   HCT 36.2 09/19/2019   PLT 197.0 09/19/2019   GLUCOSE 224 (H) 06/02/2020   CHOL 110 06/02/2020   TRIG 70.0 06/02/2020   HDL 48.50 06/02/2020   LDLCALC 48 06/02/2020   ALT 18 06/02/2020   AST 16 06/02/2020   NA 140 06/02/2020   K 4.2 06/02/2020   CL 104 06/02/2020   CREATININE 0.82 06/02/2020   BUN 17 06/02/2020   CO2 27 06/02/2020   TSH 2.34 07/16/2020   HGBA1C 8.5 (H) 06/02/2020   MICROALBUR 1.4 06/02/2020    MM 3D SCREEN BREAST BILATERAL  Result Date: 07/30/2020 CLINICAL DATA:  Screening. EXAM: DIGITAL SCREENING BILATERAL MAMMOGRAM WITH TOMOSYNTHESIS AND CAD TECHNIQUE: Bilateral screening digital craniocaudal and mediolateral oblique mammograms were obtained. Bilateral screening digital breast tomosynthesis was performed. The images were evaluated with computer-aided detection. COMPARISON:  Previous exam(s). ACR Breast Density Category b:  There are scattered areas of fibroglandular density. FINDINGS: There are no findings suspicious for malignancy. IMPRESSION: No mammographic evidence of malignancy. A result letter of this screening mammogram will be mailed directly to the patient. RECOMMENDATION: Screening mammogram in one year. (Code:SM-B-01Y) BI-RADS CATEGORY  1: Negative. Electronically Signed   By: Lovey Newcomer M.D.   On: 07/30/2020 15:58      Assessment & Plan:   Problem List Items Addressed This Visit     Anemia    Follow cbc.       Carotid  artery disease, unspecified laterality ([place])    Has seen [facility].  Carotid ultrasound as outlined.  Stable.  Continue crestor.        Constipation    Miralax.       Fall    Recent fall as outlined.  Saw ENT - nasal fracture.  Doing better.  No further falls.  Exercise.  Follow.       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  Doing well.  Followed by GI.       GERD (gastroesophageal reflux disease)    Upper symptoms controlled. On protonix.        Hypercholesterolemia    Continue crestor.  Low cholesterol diet and exercise.  Follow lipid panel and liver function tests.        Relevant Orders   TSH   Hypertension    Continue valsartan/hctz and amlodipine.  Blood pressure as outlined.  Continue current medications.  Follow pressures.  Follow metabolic panel.       Relevant Orders   CBC with Differential/Platelet   Hypothyroidism    On thyroid replacement.  Follow tsh.       Spinal stenosis    S/p surgery.  Follow.        Type 1 diabetes mellitus with hyperglycemia ([place])    Followed by endocrinology.  Sugars - poorly controlled.  Low carb diet and exercise.  Check met b and a1c.  Up to date with eye exam - no retinopathy 03/2020.        Other Visit Diagnoses     Need for immunization against influenza    -  Primary   Relevant Orders   Flu Vaccine QUAD High Dose(Fluad) (Completed)        Einar Pheasant, MD

## 2020-10-08 NOTE — Telephone Encounter (Signed)
Orders have been placed.

## 2020-10-08 NOTE — Addendum Note (Signed)
Addended by: Lars Masson on: 10/08/2020 09:26 AM   Modules accepted: Orders

## 2020-10-12 DIAGNOSIS — J301 Allergic rhinitis due to pollen: Secondary | ICD-10-CM | POA: Diagnosis not present

## 2020-10-13 ENCOUNTER — Encounter: Payer: Self-pay | Admitting: Internal Medicine

## 2020-10-13 DIAGNOSIS — W19XXXA Unspecified fall, initial encounter: Secondary | ICD-10-CM | POA: Insufficient documentation

## 2020-10-13 NOTE — Assessment & Plan Note (Signed)
S/p surgery.  Follow.

## 2020-10-13 NOTE — Assessment & Plan Note (Signed)
History of gastric lymphoma.  Doing well.  Followed by GI.

## 2020-10-13 NOTE — Assessment & Plan Note (Signed)
Upper symptoms controlled.  On protonix.  

## 2020-10-13 NOTE — Assessment & Plan Note (Signed)
Has seen [facility].  Carotid ultrasound as outlined.  Stable.  Continue crestor.

## 2020-10-13 NOTE — Assessment & Plan Note (Signed)
Recent fall as outlined.  Saw ENT - nasal fracture.  Doing better.  No further falls.  Exercise.  Follow.

## 2020-10-13 NOTE — Assessment & Plan Note (Signed)
Continue valsartan/hctz and amlodipine.  Blood pressure as outlined.  Continue current medications.  Follow pressures.  Follow metabolic panel.

## 2020-10-13 NOTE — Assessment & Plan Note (Signed)
Followed by endocrinology.  Sugars - poorly controlled.  Low carb diet and exercise.  Check met b and a1c.  Up to date with eye exam - no retinopathy 03/2020.

## 2020-10-13 NOTE — Assessment & Plan Note (Signed)
Follow cbc.  

## 2020-10-13 NOTE — Assessment & Plan Note (Signed)
On thyroid replacement.  Follow tsh.  

## 2020-10-13 NOTE — Assessment & Plan Note (Signed)
Continue crestor.  Low cholesterol diet and exercise. Follow lipid panel and liver function tests.   

## 2020-10-13 NOTE — Assessment & Plan Note (Signed)
Miralax

## 2020-10-19 DIAGNOSIS — J301 Allergic rhinitis due to pollen: Secondary | ICD-10-CM | POA: Diagnosis not present

## 2020-10-19 DIAGNOSIS — M1731 Unilateral post-traumatic osteoarthritis, right knee: Secondary | ICD-10-CM | POA: Diagnosis not present

## 2020-10-19 DIAGNOSIS — M2392 Unspecified internal derangement of left knee: Secondary | ICD-10-CM | POA: Diagnosis not present

## 2020-10-21 ENCOUNTER — Other Ambulatory Visit: Payer: Self-pay | Admitting: Physician Assistant

## 2020-10-21 ENCOUNTER — Other Ambulatory Visit (INDEPENDENT_AMBULATORY_CARE_PROVIDER_SITE_OTHER): Payer: PPO

## 2020-10-21 ENCOUNTER — Other Ambulatory Visit: Payer: Self-pay

## 2020-10-21 DIAGNOSIS — M2392 Unspecified internal derangement of left knee: Secondary | ICD-10-CM

## 2020-10-21 DIAGNOSIS — I10 Essential (primary) hypertension: Secondary | ICD-10-CM | POA: Diagnosis not present

## 2020-10-21 DIAGNOSIS — E7800 Pure hypercholesterolemia, unspecified: Secondary | ICD-10-CM

## 2020-10-21 DIAGNOSIS — E1065 Type 1 diabetes mellitus with hyperglycemia: Secondary | ICD-10-CM | POA: Diagnosis not present

## 2020-10-21 LAB — CBC WITH DIFFERENTIAL/PLATELET
Basophils Absolute: 0 10*3/uL (ref 0.0–0.1)
Basophils Relative: 0.2 % (ref 0.0–3.0)
Eosinophils Absolute: 0 10*3/uL (ref 0.0–0.7)
Eosinophils Relative: 0.1 % (ref 0.0–5.0)
HCT: 33.4 % — ABNORMAL LOW (ref 36.0–46.0)
Hemoglobin: 11.2 g/dL — ABNORMAL LOW (ref 12.0–15.0)
Lymphocytes Relative: 12.4 % (ref 12.0–46.0)
Lymphs Abs: 1.1 10*3/uL (ref 0.7–4.0)
MCHC: 33.7 g/dL (ref 30.0–36.0)
MCV: 93.2 fl (ref 78.0–100.0)
Monocytes Absolute: 0.5 10*3/uL (ref 0.1–1.0)
Monocytes Relative: 6 % (ref 3.0–12.0)
Neutro Abs: 7.4 10*3/uL (ref 1.4–7.7)
Neutrophils Relative %: 81.3 % — ABNORMAL HIGH (ref 43.0–77.0)
Platelets: 224 10*3/uL (ref 150.0–400.0)
RBC: 3.58 Mil/uL — ABNORMAL LOW (ref 3.87–5.11)
RDW: 14.2 % (ref 11.5–15.5)
WBC: 9.1 10*3/uL (ref 4.0–10.5)

## 2020-10-21 LAB — HEPATIC FUNCTION PANEL
ALT: 19 U/L (ref 0–35)
AST: 17 U/L (ref 0–37)
Albumin: 4.2 g/dL (ref 3.5–5.2)
Alkaline Phosphatase: 72 U/L (ref 39–117)
Bilirubin, Direct: 0.1 mg/dL (ref 0.0–0.3)
Total Bilirubin: 0.4 mg/dL (ref 0.2–1.2)
Total Protein: 6.8 g/dL (ref 6.0–8.3)

## 2020-10-21 LAB — BASIC METABOLIC PANEL
BUN: 32 mg/dL — ABNORMAL HIGH (ref 6–23)
CO2: 27 mEq/L (ref 19–32)
Calcium: 9.8 mg/dL (ref 8.4–10.5)
Chloride: 104 mEq/L (ref 96–112)
Creatinine, Ser: 0.84 mg/dL (ref 0.40–1.20)
GFR: 62.39 mL/min (ref >60.00)
Glucose, Bld: 190 mg/dL — ABNORMAL HIGH (ref 70–99)
Potassium: 4.2 mEq/L (ref 3.5–5.1)
Sodium: 139 mEq/L (ref 135–145)

## 2020-10-21 LAB — LIPID PANEL
Cholesterol: 111 mg/dL (ref 0–200)
HDL: 47.9 mg/dL (ref >39.00)
LDL Cholesterol: 47 mg/dL (ref 0–99)
NonHDL: 63.15
Total CHOL/HDL Ratio: 2
Triglycerides: 83 mg/dL (ref 0.0–149.0)
VLDL: 16.6 mg/dL (ref 0.0–40.0)

## 2020-10-21 LAB — TSH: TSH: 1.39 u[IU]/mL (ref 0.35–5.50)

## 2020-10-21 LAB — HEMOGLOBIN A1C: Hgb A1c MFr Bld: 7.5 % — ABNORMAL HIGH (ref 4.6–6.5)

## 2020-10-22 ENCOUNTER — Other Ambulatory Visit: Payer: Self-pay | Admitting: Internal Medicine

## 2020-10-22 DIAGNOSIS — D649 Anemia, unspecified: Secondary | ICD-10-CM

## 2020-10-22 NOTE — Progress Notes (Signed)
Order placed for f/u labs.  

## 2020-10-26 DIAGNOSIS — J301 Allergic rhinitis due to pollen: Secondary | ICD-10-CM | POA: Diagnosis not present

## 2020-10-30 ENCOUNTER — Other Ambulatory Visit: Payer: Self-pay

## 2020-10-30 ENCOUNTER — Ambulatory Visit
Admission: RE | Admit: 2020-10-30 | Discharge: 2020-10-30 | Disposition: A | Discharge status: Home / Self Care | Payer: PPO | Source: Ambulatory Visit | Attending: Physician Assistant | Admitting: Physician Assistant

## 2020-10-30 DIAGNOSIS — M2392 Unspecified internal derangement of left knee: Secondary | ICD-10-CM | POA: Diagnosis not present

## 2020-10-30 DIAGNOSIS — M25562 Pain in left knee: Secondary | ICD-10-CM | POA: Diagnosis not present

## 2020-11-03 DIAGNOSIS — J301 Allergic rhinitis due to pollen: Secondary | ICD-10-CM | POA: Diagnosis not present

## 2020-11-10 ENCOUNTER — Other Ambulatory Visit (INDEPENDENT_AMBULATORY_CARE_PROVIDER_SITE_OTHER): Payer: PPO

## 2020-11-10 ENCOUNTER — Other Ambulatory Visit: Payer: Self-pay

## 2020-11-10 DIAGNOSIS — J301 Allergic rhinitis due to pollen: Secondary | ICD-10-CM | POA: Diagnosis not present

## 2020-11-10 DIAGNOSIS — D649 Anemia, unspecified: Secondary | ICD-10-CM | POA: Diagnosis not present

## 2020-11-10 LAB — CBC WITH DIFFERENTIAL/PLATELET
Basophils Absolute: 0 10*3/uL (ref 0.0–0.1)
Basophils Relative: 0.5 % (ref 0.0–3.0)
Eosinophils Absolute: 0.1 10*3/uL (ref 0.0–0.7)
Eosinophils Relative: 2.2 % (ref 0.0–5.0)
HCT: 33.4 % — ABNORMAL LOW (ref 36.0–46.0)
Hemoglobin: 11.3 g/dL — ABNORMAL LOW (ref 12.0–15.0)
Lymphocytes Relative: 17.7 % (ref 12.0–46.0)
Lymphs Abs: 1.2 10*3/uL (ref 0.7–4.0)
MCHC: 33.8 g/dL (ref 30.0–36.0)
MCV: 91.2 fl (ref 78.0–100.0)
Monocytes Absolute: 0.6 10*3/uL (ref 0.1–1.0)
Monocytes Relative: 9.2 % (ref 3.0–12.0)
Neutro Abs: 4.7 10*3/uL (ref 1.4–7.7)
Neutrophils Relative %: 70.4 % (ref 43.0–77.0)
Platelets: 195 10*3/uL (ref 150.0–400.0)
RBC: 3.66 Mil/uL — ABNORMAL LOW (ref 3.87–5.11)
RDW: 13.6 % (ref 11.5–15.5)
WBC: 6.7 10*3/uL (ref 4.0–10.5)

## 2020-11-10 LAB — IBC + FERRITIN
Ferritin: 28.6 ng/mL (ref 10.0–291.0)
Iron: 66 ug/dL (ref 42–145)
Saturation Ratios: 19.5 % — ABNORMAL LOW (ref 20.0–50.0)
TIBC: 338.8 ug/dL (ref 250.0–450.0)
Transferrin: 242 mg/dL (ref 212.0–360.0)

## 2020-11-10 LAB — VITAMIN B12: Vitamin B-12: 621 pg/mL (ref 211–911)

## 2020-11-16 DIAGNOSIS — M160 Bilateral primary osteoarthritis of hip: Secondary | ICD-10-CM | POA: Diagnosis not present

## 2020-11-16 DIAGNOSIS — M25551 Pain in right hip: Secondary | ICD-10-CM | POA: Diagnosis not present

## 2020-11-24 DIAGNOSIS — J301 Allergic rhinitis due to pollen: Secondary | ICD-10-CM | POA: Diagnosis not present

## 2020-11-25 DIAGNOSIS — J301 Allergic rhinitis due to pollen: Secondary | ICD-10-CM | POA: Diagnosis not present

## 2020-12-03 DIAGNOSIS — E1029 Type 1 diabetes mellitus with other diabetic kidney complication: Secondary | ICD-10-CM | POA: Diagnosis not present

## 2020-12-03 DIAGNOSIS — G8929 Other chronic pain: Secondary | ICD-10-CM | POA: Diagnosis not present

## 2020-12-03 DIAGNOSIS — R809 Proteinuria, unspecified: Secondary | ICD-10-CM | POA: Diagnosis not present

## 2020-12-03 DIAGNOSIS — M25551 Pain in right hip: Secondary | ICD-10-CM | POA: Diagnosis not present

## 2020-12-08 DIAGNOSIS — J301 Allergic rhinitis due to pollen: Secondary | ICD-10-CM | POA: Diagnosis not present

## 2020-12-22 DIAGNOSIS — J301 Allergic rhinitis due to pollen: Secondary | ICD-10-CM | POA: Diagnosis not present

## 2020-12-22 DIAGNOSIS — M160 Bilateral primary osteoarthritis of hip: Secondary | ICD-10-CM | POA: Diagnosis not present

## 2020-12-22 DIAGNOSIS — M25551 Pain in right hip: Secondary | ICD-10-CM | POA: Diagnosis not present

## 2020-12-29 DIAGNOSIS — J301 Allergic rhinitis due to pollen: Secondary | ICD-10-CM | POA: Diagnosis not present

## 2021-01-01 DIAGNOSIS — E039 Hypothyroidism, unspecified: Secondary | ICD-10-CM | POA: Diagnosis not present

## 2021-01-01 DIAGNOSIS — E785 Hyperlipidemia, unspecified: Secondary | ICD-10-CM | POA: Diagnosis not present

## 2021-01-01 DIAGNOSIS — E1159 Type 2 diabetes mellitus with other circulatory complications: Secondary | ICD-10-CM | POA: Diagnosis not present

## 2021-01-01 DIAGNOSIS — R809 Proteinuria, unspecified: Secondary | ICD-10-CM | POA: Diagnosis not present

## 2021-01-01 DIAGNOSIS — I152 Hypertension secondary to endocrine disorders: Secondary | ICD-10-CM | POA: Diagnosis not present

## 2021-01-01 DIAGNOSIS — E1069 Type 1 diabetes mellitus with other specified complication: Secondary | ICD-10-CM | POA: Diagnosis not present

## 2021-01-01 DIAGNOSIS — E1029 Type 1 diabetes mellitus with other diabetic kidney complication: Secondary | ICD-10-CM | POA: Diagnosis not present

## 2021-01-05 DIAGNOSIS — J301 Allergic rhinitis due to pollen: Secondary | ICD-10-CM | POA: Diagnosis not present

## 2021-01-12 ENCOUNTER — Other Ambulatory Visit: Payer: Self-pay | Admitting: Internal Medicine

## 2021-01-12 DIAGNOSIS — J301 Allergic rhinitis due to pollen: Secondary | ICD-10-CM | POA: Diagnosis not present

## 2021-01-19 ENCOUNTER — Other Ambulatory Visit: Payer: Self-pay

## 2021-01-19 ENCOUNTER — Telehealth: Payer: Self-pay | Admitting: Internal Medicine

## 2021-01-19 DIAGNOSIS — J301 Allergic rhinitis due to pollen: Secondary | ICD-10-CM | POA: Diagnosis not present

## 2021-01-19 MED ORDER — PANTOPRAZOLE SODIUM 40 MG PO TBEC: 40.0000 mg | DELAYED_RELEASE_TABLET | Freq: Two times a day (BID) | ORAL | 1 refills | Status: DC

## 2021-01-19 MED ORDER — AMLODIPINE BESYLATE 5 MG PO TABS: 5.0000 mg | ORAL_TABLET | Freq: Every day | ORAL | 1 refills | Status: DC

## 2021-01-19 MED ORDER — METFORMIN HCL ER 500 MG PO TB24: 500.0000 mg | ORAL_TABLET | Freq: Two times a day (BID) | ORAL | 1 refills | Status: DC

## 2021-01-19 NOTE — Telephone Encounter (Signed)
I tried to call patient at both numbers in chart & was unable to reach. All medications have been sent to [facility].

## 2021-01-19 NOTE — Telephone Encounter (Signed)
I reached patient & was able to let her know that medications sent.

## 2021-01-19 NOTE — Telephone Encounter (Signed)
Debbie called from [facility] stating pt is completely out of metformin, pantoprazole and amlodipine

## 2021-01-26 ENCOUNTER — Telehealth: Payer: Self-pay | Admitting: Internal Medicine

## 2021-01-26 DIAGNOSIS — J301 Allergic rhinitis due to pollen: Secondary | ICD-10-CM | POA: Diagnosis not present

## 2021-01-26 NOTE — Telephone Encounter (Signed)
Pt called in requesting refill on medication (valsartan-hydrochlorothiazide (DIOVAN-HCT) 160-12.5 MG tablet). Pt stated that she ran out of medication. Pt stated that pharmacy advise her that they reached out to provider office and the office didn't respond. Pt requesting callback.

## 2021-01-27 ENCOUNTER — Other Ambulatory Visit: Payer: Self-pay

## 2021-01-27 MED ORDER — VALSARTAN-HYDROCHLOROTHIAZIDE 160-12.5 MG PO TABS: 1.0000 | ORAL_TABLET | Freq: Every day | ORAL | 1 refills | Status: DC

## 2021-01-27 NOTE — Telephone Encounter (Signed)
Medication sent in. Patient aware.

## 2021-01-28 ENCOUNTER — Ambulatory Visit (INDEPENDENT_AMBULATORY_CARE_PROVIDER_SITE_OTHER): Payer: PPO

## 2021-01-28 VITALS — Ht 64.0 in | Wt 184.0 lb

## 2021-01-28 DIAGNOSIS — Z0000 Encounter for general adult medical examination without abnormal findings: Secondary | ICD-10-CM | POA: Diagnosis not present

## 2021-01-28 NOTE — Progress Notes (Signed)
Subjective:   Marissa Huff is a 88 y.o. female who presents for Medicare Annual (Subsequent) preventive examination.  Review of Systems    No ROS.  Medicare Wellness Virtual Visit.  Visual/audio telehealth visit, UTA vital signs.   See social history for additional risk factors.   Cardiac Risk Factors include: advanced age (>36men, >72 women);diabetes mellitus;hypertension     Objective:    Today's Vitals   01/28/21 1031  Weight: 184 lb (83.5 kg)  Height: 5\' 4"  (1.626 m)   Body mass index is 31.58 kg/m.  Advanced Directives 01/28/2021 03/19/2020 01/28/2020 12/20/2019 01/21/2019 01/19/2018 01/18/2017  Does Patient Have a Medical Advance Directive? No No No No No No No  Does patient want to make changes to medical advance directive? - - - - - No - Patient declined -  Would patient like information on creating a medical advance directive? No - Patient declined - No - Patient declined - No - Patient declined - No - Patient declined    Current Medications (verified) Outpatient Encounter Medications as of 01/28/2021  Medication Sig   albuterol (VENTOLIN HFA) 108 (90 Base) MCG/ACT inhaler Inhale 2 puffs into the lungs every 6 (six) hours as needed for wheezing or shortness of breath.   amLODipine (NORVASC) 5 MG tablet Take 1 tablet (5 mg total) by mouth daily.   aspirin 81 MG tablet Take 81 mg by mouth daily.   BD PEN NEEDLE NANO U/F 32G X 4 MM MISC USE 1 EACH 4 (FOUR) TIMES DAILY   Calcium Citrate-Vitamin D (CALCIUM + D PO) Take 1 tablet by mouth daily.   Continuous Blood Gluc Receiver (FREESTYLE LIBRE 14 DAY READER) DEVI Use 1 each as needed .  Use to test blood sugar   Continuous Blood Gluc Sensor (FREESTYLE LIBRE 14 DAY SENSOR) MISC Use 1 each every 14 (fourteen) days   doxylamine, Sleep, (UNISOM) 25 MG tablet Take 1 tablet by mouth at bedtime as needed.   fexofenadine (ALLEGRA) 180 MG tablet Take 180 mg by mouth daily.   fluticasone (FLONASE) 50 MCG/ACT nasal spray SPRAY 2 SPRAYS  INTO EACH NOSTRIL EVERY DAY   glucagon (GLUCAGON EMERGENCY) 1 MG injection Inject 1 mg into the muscle once as needed.   glucose blood test strip    insulin aspart (NOVOLOG FLEXPEN) 100 UNIT/ML FlexPen Inject 10 Units into the skin in the morning, at noon, and at bedtime.   ipratropium (ATROVENT) 0.06 % nasal spray PLACE 2 SPRAYS INTO BOTH NOSTRILS 4 (FOUR) TIMES DAILY AS NEEDED FOR RHINITIS.   LANTUS SOLOSTAR 100 UNIT/ML Solostar Pen Inject 28-30 Units into the skin at bedtime.   levothyroxine (SYNTHROID) 175 MCG tablet Take 1 tablet (175 mcg total) by mouth daily.   LORazepam (ATIVAN) 0.5 MG tablet Take 0.5-1 tablets (0.25-0.5 mg total) by mouth daily as needed for anxiety.   metFORMIN (GLUCOPHAGE-XR) 500 MG 24 hr tablet Take 1 tablet (500 mg total) by mouth 2 (two) times daily.   Multiple Vitamins-Minerals (MULTIVITAMIN PO) Take 1 tablet by mouth daily.    Multiple Vitamins-Minerals (PRESERVISION AREDS PO) Take 1 capsule by mouth 2 (two) times daily.   pantoprazole (PROTONIX) 40 MG tablet Take 1 tablet (40 mg total) by mouth 2 (two) times daily.   rosuvastatin (CRESTOR) 5 MG tablet TAKE 1 TABLET BY MOUTH THREE TIMES A WEEK   valsartan-hydrochlorothiazide (DIOVAN-HCT) 160-12.5 MG tablet Take 1 tablet by mouth daily.   No facility-administered encounter medications on file as of 01/28/2021.  Allergies (verified) Penicillins   History: 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HCC)    Gastroparesis    GERD (gastroesophageal reflux disease)    Hypertension    Hypothyroidism    PUD (peptic ulcer disease)    Skin cancer    Spinal stenosis    lumbar laminectomy   Past Surgical History:  Procedure Laterality Date   ABDOMINAL HYSTERECTOMY     antrectomy and vagotomy  1994   arthroscopic left knee  12/05/08   BACK SURGERY     carpal  tunnel repai  10/10   left    CATARACT EXTRACTION, BILATERAL     EYE SURGERY     bilateral cataracts   gastric lymphoma  1996   surgery   LAPAROSCOPIC CHOLECYSTECTOMY  6/01   LUMBAR LAMINECTOMY  1999   LUMBAR LAMINECTOMY WITH COFLEX 1 LEVEL N/A 09/15/2015   Procedure: Lumbar one-two Laminectomy with placement of coflex;  Surgeon: Kristeen Miss, MD;  Location: [facility];  Service: Neurosurgery;  Laterality: N/A;   PARTIAL HYSTERECTOMY  1960   fibroid tumors, ovaries not removed   tonsillectomy     TONSILLECTOMY     Family History  Problem Relation Age of Onset   Stroke Mother    Stroke Father    Hypertension Father    Diabetes Brother    Multiple sclerosis Brother    Breast cancer Daughter 82   Colon cancer Neg Hx    Social History   Socioeconomic History   Marital status: Married    Spouse name: Not on file   Number of children: Not on file   Years of education: Not on file   Highest education level: Not on file  Occupational History   Not on file  Tobacco Use   Smoking status: Never   Smokeless tobacco: Never  Vaping Use   Vaping Use: Never used  Substance and Sexual Activity   Alcohol use: No    Alcohol/week: 0.0 standard drinks   Drug use: No   Sexual activity: Not Currently    Birth control/protection: None, Post-menopausal  Other Topics Concern   Not on file  Social History Narrative   Not on file   Social Determinants of Health   Financial Resource Strain: Low Risk    Difficulty of Paying Living Expenses: Not hard at all  Food Insecurity: No Food Insecurity   Worried About Charity fundraiser in the Last Year: Never true   Ran Out of Food in the Last Year: Never true  Transportation Needs: No Transportation Needs   Lack of Transportation (Medical): No   Lack of Transportation (Non-Medical): No  Physical Activity: Not on file  Stress: No Stress Concern Present   Feeling of Stress : Not at all  Social Connections: Socially Integrated   Frequency  of Communication with Friends and Family: More than three times a week   Frequency of Social Gatherings with Friends and Family: More than three times a week   Attends Religious Services: More than 4 times per year   Active Member of [employer] or Organizations: Yes   Attends Archivist Meetings: Not on file   Marital Status: Married    Tobacco Counseling Counseling given: Not Answered   Clinical Intake:  Pre-visit preparation completed: Yes  Diabetes: Yes (Followed by Endocrinology)  How often do you need to have someone help you when you read instructions, pamphlets, or other written materials from your doctor or pharmacy?: 1 - Never  Nutrition Risk Assessment: Does the patient have any non-healing wounds?  No   Diabetes: Followed by Endocrinology   Financial Strains and Diabetes Management: Does the patient want to be seen by Chronic Care Management for management of their diabetes?  No  Would the patient like to be referred to a Nutritionist or for Diabetic Management?  No     Interpreter Needed?: No    Activities of Daily Living In your present state of health, do you have any difficulty performing the following activities: 01/28/2021  Hearing? Y  Comment Hearing aids  Vision? N  Difficulty concentrating or making decisions? N  Comment Age appropriate  Walking or climbing stairs? N  Comment Paces self  Dressing or bathing? N  Doing errands, shopping? N  Preparing Food and eating ? N  Using the Toilet? N  In the past six months, have you accidently leaked urine? Y  Comment Managed with Poise Pads  Do you have problems with loss of bowel control? N  Managing your Medications? N  Managing your Finances? N  Housekeeping or managing your Housekeeping? N  Some recent data might be hidden    Patient Care Team: Einar Pheasant, MD as PCP - General (Internal Medicine)  Indicate any recent Medical Services you may have received from other than [facility]  providers in the past year (date may be approximate).     Assessment:   This is a routine wellness examination for Madalin.  Virtual Visit via Telephone Note  I connected with  [name] on 01/28/21 at 10:30 AM EST by telephone and verified that I am speaking with the correct person using two identifiers.  Persons participating in the virtual visit: patient/Nurse Health Advisor   I discussed the limitations, risks, security and privacy concerns of performing an evaluation and management service by telephone and the availability of in person appointments. The patient expressed understanding and agreed to proceed.  Interactive audio and video telecommunications were attempted between this nurse and patient, however failed, due to patient having technical difficulties OR patient did not have access to video capability.  We continued and completed visit with audio only.  Some vital signs may be absent or patient reported.   Hearing/Vision screen Hearing Screening - Comments:: Followed by [facility] Visits as needed  Hearing aid, bilateral Vision Screening - Comments:: Followed by [facility] Dr. Wallace Going Wears corrective lenses  Annual visits  Cataract extraction, bilateral  They have regular follow up with the ophthalmologist  Dietary issues and exercise activities discussed:   Low carb diet Good water intake    Goals Addressed             This Visit's Progress    Follow up with Primary Care Provider       As needed.       Depression Screen PHQ 2/9 Scores 01/28/2021 01/28/2020 01/27/2020 01/21/2019 01/19/2018 01/18/2017 06/07/2016  PHQ - 2 Score 0 0 0 0 0 0 0  PHQ- 9 Score - - - - - 0 -    Fall Risk Fall Risk  01/28/2021 01/28/2020 01/21/2019 01/19/2018 01/18/2017  Falls in the past year? 0 0 0 0 No  Number falls in past yr: 0 0 - - -  Injury with Fall? - 0 - - -  Comment - - - - -  Risk Factor Category  - - - - -  Follow up Falls evaluation completed Falls  evaluation completed Falls evaluation completed - -   FALL RISK PREVENTION PERTAINING TO THE HOME: Home free of loose throw rugs in walkways, pet beds, electrical cords, etc? Yes  Adequate lighting in your home to reduce risk of falls? Yes   ASSISTIVE DEVICES UTILIZED TO PREVENT FALLS: Life alert? No  Use of a cane, walker or w/c? No  Grab bars in the bathroom? Yes  Shower chair or bench in shower? Yes  Elevated toilet seat or a handicapped toilet? Yes   TIMED UP AND GO: Was the test performed? No .   Cognitive Function: Patient is alert and oriented x3.   MMSE - Mini Mental State Exam 01/18/2017 12/09/2015 12/09/2014  Orientation to time 5 5 5   Orientation to Place 5 5 5   Registration 3 3 3   Attention/ Calculation 4 5 5   Recall 3 3 3   Language- name 2 objects 2 2 2   Language- repeat 1 1 1   Language- follow 3 step command 3 3 3   Language- read & follow direction 1 1 1   Write a sentence 1 1 1   Copy design 1 1 1   Total score 29 30 30      6CIT Screen 01/28/2021 01/21/2019 01/19/2018  What Year? 0 points 0 points 0 points  What month? 0 points 0 points 0 points  What time? 0 points 0 points 0 points  Count back from 20 0 points 0 points 0 points  Months in reverse 0 points 0 points 0 points  Repeat phrase 0 points - 0 points  Total Score 0 - 0   Immunizations Immunization History  Administered Date(s) Administered   Fluad Quad(high Dose 65+) 09/25/2019, 10/07/2020   Influenza Split 12/12/2011, 10/01/2012   Influenza, High Dose Seasonal PF 10/02/2015, 09/08/2016, 09/19/2017, 09/26/2018   Influenza,inj,Quad PF,6+ Mos 09/03/2013, 10/27/2014   PFIZER(Purple Top)SARS-COV-2 Vaccination 01/29/2019, 02/19/2019, 10/24/2019   Pneumococcal Conjugate-13 11/11/2014   Pneumococcal Polysaccharide-23 12/09/2015   Zoster Recombinat (Shingrix) 04/08/2020, 07/21/2020   TDAP status: Due, Education has been provided regarding the importance of this vaccine. Advised may receive this vaccine at  local pharmacy or Health Dept. Aware to provide a copy of the vaccination record if obtained from local pharmacy or Health Dept. Verbalized acceptance and understanding. Deferred.   Screening Tests Health Maintenance  Topic Date Due   FOOT EXAM  02/08/2021 (Originally 05/06/2018)   COVID-19 Vaccine (4 - Booster for [place] series) 02/13/2021 (Originally 12/19/2019)   TETANUS/TDAP  01/28/2022 (Originally 03/09/1950)   HEMOGLOBIN A1C  04/21/2021   MAMMOGRAM  07/29/2021   OPHTHALMOLOGY EXAM  10/05/2021   Pneumonia Vaccine 16+ Years old  Completed   INFLUENZA VACCINE  Completed   DEXA SCAN  Completed   Zoster Vaccines- Shingrix  Completed   HPV VACCINES  Aged Out   Health Maintenance There are no preventive care reminders to display for this patient.  Lung Cancer Screening: (Low Dose CT Chest recommended if Age 82-80 years, 30 pack-year currently smoking OR have quit w/in 15years.) does not qualify.   Hepatitis C Screening: does not qualify  Vision Screening: Recommended annual ophthalmology exams for early detection of glaucoma and other disorders of the eye.  Dental Screening: Recommended annual dental exams for proper oral hygiene  Community Resource Referral / Chronic Care Management: CRR required this visit?  No   CCM required this visit?  No  Plan:   Keep all routine maintenance appointments.   I have personally reviewed and noted the following in the patients chart:   Medical and social history Use of alcohol, tobacco or illicit drugs  Current medications and supplements including opioid prescriptions. Not taking opioid.  Functional ability and status Nutritional status Physical activity Advanced directives List of other physicians Hospitalizations, surgeries, and ER visits in previous 12 months Vitals Screenings to include cognitive, depression, and falls Referrals and appointments  In addition, I have reviewed and discussed with patient certain preventive  protocols, quality metrics, and best practice recommendations. A written personalized care plan for preventive services as well as general preventive health recommendations were provided to patient.     Varney Biles, LPN   [id number]

## 2021-01-28 NOTE — Patient Instructions (Addendum)
Ms. Marissa Huff , Thank you for taking time to come for your Medicare Wellness Visit. I appreciate your ongoing commitment to your health goals. Please review the following plan we discussed and let me know if I can assist you in the future.   These are the goals we discussed:  Goals      Follow up with Primary Care Provider     As needed.        This is a list of the screening recommended for you and due dates:  Health Maintenance  Topic Date Due   Complete foot exam   02/08/2021*   COVID-19 Vaccine (4 - Booster for Pfizer series) 02/13/2021*   Tetanus Vaccine  01/28/2022*   Hemoglobin A1C  04/21/2021   Mammogram  07/29/2021   Eye exam for diabetics  10/05/2021   Pneumonia Vaccine  Completed   Flu Shot  Completed   DEXA scan (bone density measurement)  Completed   Zoster (Shingles) Vaccine  Completed   HPV Vaccine  Aged Out  *Topic was postponed. The date shown is not the original due date.    Advanced directives: not yet completed  Conditions/risks identified: none new.   Follow up in one year for your annual wellness visit    Preventive Care 65 Years and Older, Female Preventive care refers to lifestyle choices and visits with your health care provider that can promote health and wellness. What does preventive care include? A yearly physical exam. This is also called an annual well check. Dental exams once or twice a year. Routine eye exams. Ask your health care provider how often you should have your eyes checked. Personal lifestyle choices, including: Daily care of your teeth and gums. Regular physical activity. Eating a healthy diet. Avoiding tobacco and drug use. Limiting alcohol use. Practicing safe sex. Taking low-dose aspirin every day. Taking vitamin and mineral supplements as recommended by your health care provider. What happens during an annual well check? The services and screenings done by your health care provider during your annual well check will  depend on your age, overall health, lifestyle risk factors, and family history of disease. Counseling  Your health care provider may ask you questions about your: Alcohol use. Tobacco use. Drug use. Emotional well-being. Home and relationship well-being. Sexual activity. Eating habits. History of falls. Memory and ability to understand (cognition). Work and work Statistician. Reproductive health. Screening  You may have the following tests or measurements: Height, weight, and BMI. Blood pressure. Lipid and cholesterol levels. These may be checked every 5 years, or more frequently if you are over 56 years old. Skin check. Lung cancer screening. You may have this screening every year starting at age 62 if you have a 30-pack-year history of smoking and currently smoke or have quit within the past 15 years. Fecal occult blood test (FOBT) of the stool. You may have this test every year starting at age 38. Flexible sigmoidoscopy or colonoscopy. You may have a sigmoidoscopy every 5 years or a colonoscopy every 10 years starting at age 87. Hepatitis C blood test. Hepatitis B blood test. Sexually transmitted disease (STD) testing. Diabetes screening. This is done by checking your blood sugar (glucose) after you have not eaten for a while (fasting). You may have this done every 1-3 years. Bone density scan. This is done to screen for osteoporosis. You may have this done starting at age 79. Mammogram. This may be done every 1-2 years. Talk to your health care provider about how often  you should have regular mammograms. Talk with your health care provider about your test results, treatment options, and if necessary, the need for more tests. Vaccines  Your health care provider may recommend certain vaccines, such as: Influenza vaccine. This is recommended every year. Tetanus, diphtheria, and acellular pertussis (Tdap, Td) vaccine. You may need a Td booster every 10 years. Zoster vaccine. You may  need this after age 17. Pneumococcal 13-valent conjugate (PCV13) vaccine. One dose is recommended after age 47. Pneumococcal polysaccharide (PPSV23) vaccine. One dose is recommended after age 23. Talk to your health care provider about which screenings and vaccines you need and how often you need them. This information is not intended to replace advice given to you by your health care provider. Make sure you discuss any questions you have with your health care provider. Document Released: 01/16/2015 Document Revised: 09/09/2015 Document Reviewed: 10/21/2014 Elsevier Interactive Patient Education  2017  Prevention in the Home Falls can cause injuries. They can happen to people of all ages. There are many things you can do to make your home safe and to help prevent falls. What can I do on the outside of my home? Regularly fix the edges of walkways and driveways and fix any cracks. Remove anything that might make you trip as you walk through a door, such as a raised step or threshold. Trim any bushes or trees on the path to your home. Use bright outdoor lighting. Clear any walking paths of anything that might make someone trip, such as rocks or tools. Regularly check to see if handrails are loose or broken. Make sure that both sides of any steps have handrails. Any raised decks and porches should have guardrails on the edges. Have any leaves, snow, or ice cleared regularly. Use sand or salt on walking paths during winter. Clean up any spills in your garage right away. This includes oil or grease spills. What can I do in the bathroom? Use night lights. Install grab bars by the toilet and in the tub and shower. Do not use towel bars as grab bars. Use non-skid mats or decals in the tub or shower. If you need to sit down in the shower, use a plastic, non-slip stool. Keep the floor dry. Clean up any water that spills on the floor as soon as it happens. Remove soap buildup in  the tub or shower regularly. Attach bath mats securely with double-sided non-slip rug tape. Do not have throw rugs and other things on the floor that can make you trip. What can I do in the bedroom? Use night lights. Make sure that you have a light by your bed that is easy to reach. Do not use any sheets or blankets that are too big for your bed. They should not hang down onto the floor. Have a firm chair that has side arms. You can use this for support while you get dressed. Do not have throw rugs and other things on the floor that can make you trip. What can I do in the kitchen? Clean up any spills right away. Avoid walking on wet floors. Keep items that you use a lot in easy-to-reach places. If you need to reach something above you, use a strong step stool that has a grab bar. Keep electrical cords out of the way. Do not use floor polish or wax that makes floors slippery. If you must use wax, use non-skid floor wax. Do not have throw rugs and other things on  the floor that can make you trip. What can I do with my stairs? Do not leave any items on the stairs. Make sure that there are handrails on both sides of the stairs and use them. Fix handrails that are broken or loose. Make sure that handrails are as long as the stairways. Check any carpeting to make sure that it is firmly attached to the stairs. Fix any carpet that is loose or worn. Avoid having throw rugs at the top or bottom of the stairs. If you do have throw rugs, attach them to the floor with carpet tape. Make sure that you have a light switch at the top of the stairs and the bottom of the stairs. If you do not have them, ask someone to add them for you. What else can I do to help prevent falls? Wear shoes that: Do not have high heels. Have rubber bottoms. Are comfortable and fit you well. Are closed at the toe. Do not wear sandals. If you use a stepladder: Make sure that it is fully opened. Do not climb a closed  stepladder. Make sure that both sides of the stepladder are locked into place. Ask someone to hold it for you, if possible. Clearly mark and make sure that you can see: Any grab bars or handrails. First and last steps. Where the edge of each step is. Use tools that help you move around (mobility aids) if they are needed. These include: Canes. Walkers. Scooters. Crutches. Turn on the lights when you go into a dark area. Replace any light bulbs as soon as they burn out. Set up your furniture so you have a clear path. Avoid moving your furniture around. If any of your floors are uneven, fix them. If there are any pets around you, be aware of where they are. Review your medicines with your doctor. Some medicines can make you feel dizzy. This can increase your chance of falling. Ask your doctor what other things that you can do to help prevent falls. This information is not intended to replace advice given to you by your health care provider. Make sure you discuss any questions you have with your health care provider. Document Released: 10/16/2008 Document Revised: 05/28/2015 Document Reviewed: 01/24/2014 Elsevier Interactive Patient Education  2017 [employer].

## 2021-02-02 DIAGNOSIS — J301 Allergic rhinitis due to pollen: Secondary | ICD-10-CM | POA: Diagnosis not present

## 2021-02-08 ENCOUNTER — Other Ambulatory Visit: Payer: Self-pay

## 2021-02-08 ENCOUNTER — Other Ambulatory Visit (INDEPENDENT_AMBULATORY_CARE_PROVIDER_SITE_OTHER): Payer: Self-pay | Admitting: Nurse Practitioner

## 2021-02-08 ENCOUNTER — Ambulatory Visit (INDEPENDENT_AMBULATORY_CARE_PROVIDER_SITE_OTHER): Payer: PPO | Admitting: Internal Medicine

## 2021-02-08 VITALS — BP 132/72 | HR 83 | Temp 97.6°F | Resp 16 | Ht 63.5 in | Wt 183.8 lb

## 2021-02-08 DIAGNOSIS — D649 Anemia, unspecified: Secondary | ICD-10-CM | POA: Diagnosis not present

## 2021-02-08 DIAGNOSIS — E7800 Pure hypercholesterolemia, unspecified: Secondary | ICD-10-CM

## 2021-02-08 DIAGNOSIS — E038 Other specified hypothyroidism: Secondary | ICD-10-CM

## 2021-02-08 DIAGNOSIS — E1065 Type 1 diabetes mellitus with hyperglycemia: Secondary | ICD-10-CM

## 2021-02-08 DIAGNOSIS — Q6211 Congenital occlusion of ureteropelvic junction: Secondary | ICD-10-CM

## 2021-02-08 DIAGNOSIS — Z8639 Personal history of other endocrine, nutritional and metabolic disease: Secondary | ICD-10-CM | POA: Diagnosis not present

## 2021-02-08 DIAGNOSIS — I6523 Occlusion and stenosis of bilateral carotid arteries: Secondary | ICD-10-CM

## 2021-02-08 DIAGNOSIS — C8599 Non-Hodgkin lymphoma, unspecified, extranodal and solid organ sites: Secondary | ICD-10-CM | POA: Diagnosis not present

## 2021-02-08 DIAGNOSIS — Z0000 Encounter for general adult medical examination without abnormal findings: Secondary | ICD-10-CM

## 2021-02-08 DIAGNOSIS — I779 Disorder of arteries and arterioles, unspecified: Secondary | ICD-10-CM | POA: Diagnosis not present

## 2021-02-08 DIAGNOSIS — I10 Essential (primary) hypertension: Secondary | ICD-10-CM

## 2021-02-08 DIAGNOSIS — K219 Gastro-esophageal reflux disease without esophagitis: Secondary | ICD-10-CM

## 2021-02-08 DIAGNOSIS — K5900 Constipation, unspecified: Secondary | ICD-10-CM

## 2021-02-08 MED ORDER — LEVOTHYROXINE SODIUM 175 MCG PO TABS: 175.0000 ug | ORAL_TABLET | Freq: Every day | ORAL | 1 refills | Status: DC

## 2021-02-08 MED ORDER — FLUTICASONE PROPIONATE 50 MCG/ACT NA SUSP: NASAL | 3 refills | Status: DC

## 2021-02-08 NOTE — Progress Notes (Signed)
Patient ID: Marissa Huff, female   DOB: 1931/12/01, 88 y.o.   MRN: [id number]   Subjective:    Patient ID: Marissa Huff, female    DOB: 1931/12/31, 88 y.o.   MRN: [id number]  This visit occurred during the SARS-CoV-2 public health emergency.  Safety protocols were in place, including screening questions prior to the visit, additional usage of staff PPE, and extensive cleaning of exam room while observing appropriate contact time as indicated for disinfecting solutions.   Patient here for her physical .   HPI Has been seeing ortho for persistent pain - right buttocks- radiating down to right leg and knee. issues.  MRI - meniscus tear.  Referred to physiatry for intra articular cortisone injection.  Also placed on celebrex.  S/p intra articular hip injection.  Helped.  Reports blood sugars - AM 180, lunch 200s.  Seeing endocrinology.  Using SSI. No chest pain.  Breathing stable.  Miralax for bowels.  Taking synthroid.  Has alternated between 196mg and 1741m - on her own.  (Had left over).     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   Gastroparesis    GERD (gastroesophageal reflux disease)    Hypertension    Hypothyroidism    PUD (peptic ulcer disease)    Skin cancer    Spinal stenosis    lumbar laminectomy   Past Surgical History:  Procedure Laterality Date   ABDOMINAL HYSTERECTOMY     antrectomy and vagotomy  1994   arthroscopic left knee  12/05/08   BACK SURGERY     carpal tunnel repai  10/10   left    CATARACT EXTRACTION, BILATERAL     EYE SURGERY     bilateral cataracts   gastric lymphoma  1996   surgery   LAPAROSCOPIC CHOLECYSTECTOMY  6/01   LUMBAR LAMINECTOMY  1999   LUMBAR LAMINECTOMY WITH COFLEX 1 LEVEL N/A 09/15/2015   Procedure: Lumbar one-two Laminectomy with placement of coflex;  Surgeon: HeKristeen MissMD;   Location: [facility];  Service: Neurosurgery;  Laterality: N/A;   PARTIAL HYSTERECTOMY  1960   fibroid tumors, ovaries not removed   tonsillectomy     TONSILLECTOMY     Family History  Problem Relation Age of Onset   Stroke Mother    Stroke Father    Hypertension Father    Diabetes Brother    Multiple sclerosis Brother    Breast cancer Daughter [id number] Colon cancer Neg Hx    Social History   Socioeconomic History   Marital status: Married    Spouse name: Not on file   Number of children: Not on file   Years of education: Not on file   Highest education level: Not on file  Occupational History   Not on file  Tobacco Use   Smoking status: Never   Smokeless tobacco: Never  Vaping Use   Vaping Use: Never used  Substance and Sexual Activity   Alcohol use: No    Alcohol/week: 0.0 standard drinks   Drug use: No   Sexual activity: Not Currently    Birth control/protection: None, Post-menopausal  Other Topics Concern   Not on file  Social History Narrative   Not on file   Social Determinants of Health   Financial Resource Strain: Low Risk  Difficulty of Paying Living Expenses: Not hard at all  Food Insecurity: No Food Insecurity   Worried About [place] in the Last Year: Never true   Ran Out of Food in the Last Year: Never true  Transportation Needs: No Transportation Needs   Lack of Transportation (Medical): No   Lack of Transportation (Non-Medical): No  Physical Activity: Not on file  Stress: No Stress Concern Present   Feeling of Stress : Not at all  Social Connections: Socially Integrated   Frequency of Communication with Friends and Family: More than three times a week   Frequency of Social Gatherings with Friends and Family: More than three times a week   Attends Religious Services: More than 4 times per year   Active Member of [employer] or Organizations: Yes   Attends Archivist Meetings: Not on file   Marital Status: Married     Review  of Systems  Constitutional:  Negative for appetite change and unexpected weight change.  HENT:  Negative for congestion, sinus pressure and sore throat.   Eyes:  Negative for pain and visual disturbance.  Respiratory:  Negative for cough and chest tightness.        Breathing stable.   Cardiovascular:  Negative for chest pain and palpitations.       No increased swelling.   Gastrointestinal:  Negative for abdominal pain, diarrhea, nausea and vomiting.  Genitourinary:  Negative for difficulty urinating and dysuria.  Musculoskeletal:  Negative for joint swelling and myalgias.  Skin:  Negative for color change and rash.  Neurological:  Negative for dizziness, light-headedness and headaches.  Hematological:  Negative for adenopathy. Does not bruise/bleed easily.  Psychiatric/Behavioral:  Negative for agitation and dysphoric mood.       Objective:     BP 132/72    Pulse 83    Temp 97.6 F (36.4 C)    Resp 16    Ht 5' 3.5" (1.613 m)    Wt 183 lb 12.8 oz (83.4 kg)    SpO2 99%    BMI 32.05 kg/m  Wt Readings from Last 3 Encounters:  02/09/21 183 lb (83 kg)  02/08/21 183 lb 12.8 oz (83.4 kg)  01/28/21 184 lb (83.5 kg)    Physical Exam Vitals reviewed.  Constitutional:      General: She is not in acute distress.    Appearance: Normal appearance. She is well-developed.  HENT:     Head: Normocephalic and atraumatic.     Right Ear: External ear normal.     Left Ear: External ear normal.  Eyes:     General: No scleral icterus.       Right eye: No discharge.        Left eye: No discharge.     Conjunctiva/sclera: Conjunctivae normal.  Neck:     Thyroid: No thyromegaly.  Cardiovascular:     Rate and Rhythm: Normal rate and regular rhythm.  Pulmonary:     Effort: No tachypnea, accessory muscle usage or respiratory distress.     Breath sounds: Normal breath sounds. No decreased breath sounds or wheezing.  Chest:  Breasts:    Right: No inverted nipple, mass, nipple discharge or  tenderness (no axillary adenopathy).     Left: No inverted nipple, mass, nipple discharge or tenderness (no axilarry adenopathy).  Abdominal:     General: Bowel sounds are normal.     Palpations: Abdomen is soft.     Tenderness: There is no abdominal tenderness.  Musculoskeletal:  General: No swelling or tenderness.     Cervical back: Neck supple. No tenderness.  Lymphadenopathy:     Cervical: No cervical adenopathy.  Skin:    Findings: No erythema or rash.  Neurological:     Mental Status: She is alert and oriented to person, place, and time.  Psychiatric:        Mood and Affect: Mood normal.        Behavior: Behavior normal.     Outpatient Encounter Medications as of 02/08/2021  Medication Sig   albuterol (VENTOLIN HFA) 108 (90 Base) MCG/ACT inhaler Inhale 2 puffs into the lungs every 6 (six) hours as needed for wheezing or shortness of breath.   amLODipine (NORVASC) 5 MG tablet Take 1 tablet (5 mg total) by mouth daily.   aspirin 81 MG tablet Take 81 mg by mouth daily.   BD PEN NEEDLE NANO U/F 32G X 4 MM MISC USE 1 EACH 4 (FOUR) TIMES DAILY   Calcium Citrate-Vitamin D (CALCIUM + D PO) Take 1 tablet by mouth daily.   Continuous Blood Gluc Receiver (FREESTYLE LIBRE 14 DAY READER) DEVI Use 1 each as needed .  Use to test blood sugar   Continuous Blood Gluc Sensor (FREESTYLE LIBRE 14 DAY SENSOR) MISC Use 1 each every 14 (fourteen) days   doxylamine, Sleep, (UNISOM) 25 MG tablet Take 1 tablet by mouth at bedtime as needed.   fexofenadine (ALLEGRA) 180 MG tablet Take 180 mg by mouth daily.   fluticasone (FLONASE) 50 MCG/ACT nasal spray SPRAY 2 SPRAYS INTO EACH NOSTRIL EVERY DAY   glucagon (GLUCAGON EMERGENCY) 1 MG injection Inject 1 mg into the muscle once as needed.   glucose blood test strip    insulin aspart (NOVOLOG FLEXPEN) 100 UNIT/ML FlexPen Inject 10 Units into the skin in the morning, at noon, and at bedtime.   ipratropium (ATROVENT) 0.06 % nasal spray PLACE 2 SPRAYS  INTO BOTH NOSTRILS 4 (FOUR) TIMES DAILY AS NEEDED FOR RHINITIS.   LANTUS SOLOSTAR 100 UNIT/ML Solostar Pen Inject 28-30 Units into the skin at bedtime.   levothyroxine (SYNTHROID) 175 MCG tablet Take 1 tablet (175 mcg total) by mouth daily.   LORazepam (ATIVAN) 0.5 MG tablet Take 0.5-1 tablets (0.25-0.5 mg total) by mouth daily as needed for anxiety.   metFORMIN (GLUCOPHAGE-XR) 500 MG 24 hr tablet Take 1 tablet (500 mg total) by mouth 2 (two) times daily.   Multiple Vitamins-Minerals (MULTIVITAMIN PO) Take 1 tablet by mouth daily.    Multiple Vitamins-Minerals (PRESERVISION AREDS PO) Take 1 capsule by mouth 2 (two) times daily.   pantoprazole (PROTONIX) 40 MG tablet Take 1 tablet (40 mg total) by mouth 2 (two) times daily.   rosuvastatin (CRESTOR) 5 MG tablet TAKE 1 TABLET BY MOUTH THREE TIMES A WEEK   valsartan-hydrochlorothiazide (DIOVAN-HCT) 160-12.5 MG tablet Take 1 tablet by mouth daily.   [DISCONTINUED] fluticasone (FLONASE) 50 MCG/ACT nasal spray SPRAY 2 SPRAYS INTO EACH NOSTRIL EVERY DAY   [DISCONTINUED] levothyroxine (SYNTHROID) 175 MCG tablet Take 1 tablet (175 mcg total) by mouth daily.   No facility-administered encounter medications on file as of 02/08/2021.     Lab Results  Component Value Date   WBC 6.7 11/10/2020   HGB 11.3 (L) 11/10/2020   HCT 33.4 (L) 11/10/2020   PLT 195.0 11/10/2020   GLUCOSE 190 (H) 10/21/2020   CHOL 111 10/21/2020   TRIG 83.0 10/21/2020   HDL 47.90 10/21/2020   LDLCALC 47 10/21/2020   ALT 19 10/21/2020   AST  17 10/21/2020   NA 139 10/21/2020   K 4.2 10/21/2020   CL 104 10/21/2020   CREATININE 0.84 10/21/2020   BUN 32 (H) 10/21/2020   CO2 27 10/21/2020   TSH 1.39 10/21/2020   HGBA1C 7.5 (H) 10/21/2020   MICROALBUR 1.4 06/02/2020    MR KNEE LEFT WO CONTRAST  Result Date: 10/31/2020 CLINICAL DATA:  Patient complains of anteromedial left knee pain. She states she had an injection about two weeks ago that was not helpful. She notes having 2  prior surgeries. EXAM: MRI OF THE LEFT KNEE WITHOUT CONTRAST TECHNIQUE: Multiplanar, multisequence MR imaging of the knee was performed. No intravenous contrast was administered. COMPARISON:  X-ray knee 03/19/2020; MRI left knee 05/10/2010. FINDINGS: MENISCI Medial: Radial tear of the root of the posterior horn of the medial meniscus with peripheral meniscal extrusion. Degeneration of the body of the medial meniscus. Lateral: Fraying of the free edge of the body of the lateral meniscus. Partial radial tear of the root of the posterior horn of the lateral meniscus. LIGAMENTS Cruciates: ACL and PCL are intact. Increased signal and expansion of the ACL as can be seen with mucinous degeneration. Collaterals: Medial collateral ligament is intact. Lateral collateral ligament complex is intact. CARTILAGE Patellofemoral: Partial-thickness cartilage loss of the inferior patellar apex with subchondral reactive marrow changes. Partial-thickness cartilage loss with areas of full-thickness cartilage loss of the trochlear groove with subchondral reactive marrow changes. Medial: Partial-thickness cartilage loss of the medial femorotibial compartment. Lateral: Mild partial-thickness cartilage loss of the lateral femorotibial compartment. JOINT: Small joint effusion. Normal Hoffa's fat-pad. No plical thickening. POPLITEAL FOSSA: Popliteus tendon is intact. No Baker's cyst. EXTENSOR MECHANISM: Intact quadriceps tendon. Intact patellar tendon. Intact lateral patellar retinaculum. Intact medial patellar retinaculum. Intact MPFL. BONES: No aggressive osseous lesion. No fracture or dislocation. Other: No fluid collection or hematoma. Muscles are normal. IMPRESSION: 1. Radial tear of the root of the posterior horn of the medial meniscus with peripheral meniscal extrusion. Degeneration of the body of the medial meniscus. 2. Fraying of the free edge of the body of the lateral meniscus. Partial radial tear of the root of the posterior horn  of the lateral meniscus. 3.  Tricompartmental cartilage abnormalities as described above. Electronically Signed   By: Kathreen Devoid M.D.   On: 10/31/2020 10:56       Assessment & Plan:   Problem List Items Addressed This Visit     Anemia    Follow cbc.       Relevant Orders   CBC with Differential/Platelet   Vitamin B12   IBC + Ferritin   Carotid artery disease, unspecified laterality ([place])    Has seen [facility].  Carotid ultrasound as outlined.  Stable.  Continue crestor.        Constipation    Miralax.       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  Doing well.  Followed by GI.       GERD (gastroesophageal reflux disease)    Upper symptoms controlled. On protonix.        Health care maintenance    Physical today 02/08/21.  Mammogram 07/30/20 - Birads I. Schedule bone density.        Hydronephrosis    Has seen urology.  Last evaluated 06/2019.  Per note, had planned f/u in one year with renal ultrasound.  Need to confirm if had f/u .  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       Hypercholesterolemia  Continue crestor.  Low cholesterol diet and exercise.  Follow lipid panel and liver function tests.        Relevant Orders   Hepatic function panel   Lipid panel   TSH   Hypertension    Continue valsartan/hctz and amlodipine.  Blood pressure as outlined.  Continue current medications.  Follow pressures.  Follow metabolic panel.       Relevant Orders   Basic metabolic panel   Hypothyroidism    On thyroid replacement.  Follow tsh. Apparently has varied dose taking as outlined.        Relevant Medications   levothyroxine (SYNTHROID) 175 MCG tablet   Type 1 diabetes mellitus with hyperglycemia ([place])    Followed by endocrinology.  Sugars - poorly controlled.  Low carb diet and exercise.  Check met b and a1c.  Up to date with eye exam - no retinopathy 03/2020.  Seeing endocrinology.  Using SSI.  Adjusting.       Relevant Orders   Hemoglobin A1c   Other Visit  Diagnoses     Routine general medical examination at a health care facility    -  Primary   Hx of estrogen deficiency       Relevant Orders   DG Bone Density        Einar Pheasant, MD

## 2021-02-08 NOTE — Assessment & Plan Note (Signed)
Physical today 02/08/21.  Mammogram 07/30/20 - Birads I. Schedule bone density.

## 2021-02-09 ENCOUNTER — Ambulatory Visit (INDEPENDENT_AMBULATORY_CARE_PROVIDER_SITE_OTHER): Payer: PPO

## 2021-02-09 ENCOUNTER — Ambulatory Visit (INDEPENDENT_AMBULATORY_CARE_PROVIDER_SITE_OTHER): Payer: PPO | Admitting: Vascular Surgery

## 2021-02-09 VITALS — BP 143/63 | HR 73 | Ht 63.0 in | Wt 183.0 lb

## 2021-02-09 DIAGNOSIS — I10 Essential (primary) hypertension: Secondary | ICD-10-CM | POA: Diagnosis not present

## 2021-02-09 DIAGNOSIS — I6523 Occlusion and stenosis of bilateral carotid arteries: Secondary | ICD-10-CM | POA: Diagnosis not present

## 2021-02-09 DIAGNOSIS — E119 Type 2 diabetes mellitus without complications: Secondary | ICD-10-CM | POA: Diagnosis not present

## 2021-02-09 DIAGNOSIS — I779 Disorder of arteries and arterioles, unspecified: Secondary | ICD-10-CM

## 2021-02-09 DIAGNOSIS — E7800 Pure hypercholesterolemia, unspecified: Secondary | ICD-10-CM

## 2021-02-09 NOTE — Assessment & Plan Note (Signed)
blood glucose control important in reducing the progression of atherosclerotic disease. Also, involved in wound healing. On appropriate medications.  

## 2021-02-09 NOTE — Assessment & Plan Note (Signed)
Carotid duplex today reveals velocities in the 1 to 39% range bilaterally.  No role for intervention at that level.  Continue annual surveillance and her current medical regimen.

## 2021-02-09 NOTE — Progress Notes (Signed)
MRN : [id number]  Marissa Huff is a 88 y.o. ([birth year]) female who presents with chief complaint of  Chief Complaint  Patient presents with   Follow-up    1 yr carotid   .  History of Present Illness:   Patient returns in follow up of her carotid disease. NO complaints. No focal neurologic symptoms. Specifically, the patient denies amaurosis fugax, speech or swallowing difficulties, or arm or leg weakness or numbness. Carotid duplex today reveals velocities in the 1 to 39% range bilaterally.  Current Outpatient Medications  Medication Sig Dispense Refill   albuterol (VENTOLIN HFA) 108 (90 Base) MCG/ACT inhaler Inhale 2 puffs into the lungs every 6 (six) hours as needed for wheezing or shortness of breath. 1 each 1   amLODipine (NORVASC) 5 MG tablet Take 1 tablet (5 mg total) by mouth daily. 90 tablet 1   aspirin 81 MG tablet Take 81 mg by mouth daily.     BD PEN NEEDLE NANO U/F 32G X 4 MM MISC USE 1 EACH 4 (FOUR) TIMES DAILY     Calcium Citrate-Vitamin D (CALCIUM + D PO) Take 1 tablet by mouth daily.     Continuous Blood Gluc Receiver (FREESTYLE LIBRE 14 DAY READER) DEVI Use 1 each as needed .  Use to test blood sugar     Continuous Blood Gluc Sensor (FREESTYLE LIBRE 14 DAY SENSOR) MISC Use 1 each every 14 (fourteen) days     doxylamine, Sleep, (UNISOM) 25 MG tablet Take 1 tablet by mouth at bedtime as needed.     fexofenadine (ALLEGRA) 180 MG tablet Take 180 mg by mouth daily.     fluticasone (FLONASE) 50 MCG/ACT nasal spray SPRAY 2 SPRAYS INTO EACH NOSTRIL EVERY DAY 48 mL 3   glucagon (GLUCAGON EMERGENCY) 1 MG injection Inject 1 mg into the muscle once as needed. 1 each 3   glucose blood test strip      insulin aspart (NOVOLOG FLEXPEN) 100 UNIT/ML FlexPen Inject 10 Units into the skin in the morning, at noon, and at bedtime.     ipratropium (ATROVENT) 0.06 % nasal spray PLACE 2 SPRAYS INTO BOTH NOSTRILS 4 (FOUR) TIMES DAILY AS NEEDED FOR RHINITIS. 15 mL 1   LANTUS SOLOSTAR 100  UNIT/ML Solostar Pen Inject 28-30 Units into the skin at bedtime.  4   levothyroxine (SYNTHROID) 175 MCG tablet Take 1 tablet (175 mcg total) by mouth daily. 90 tablet 1   LORazepam (ATIVAN) 0.5 MG tablet Take 0.5-1 tablets (0.25-0.5 mg total) by mouth daily as needed for anxiety. 30 tablet 0   metFORMIN (GLUCOPHAGE-XR) 500 MG 24 hr tablet Take 1 tablet (500 mg total) by mouth 2 (two) times daily. 180 tablet 1   Multiple Vitamins-Minerals (MULTIVITAMIN PO) Take 1 tablet by mouth daily.      Multiple Vitamins-Minerals (PRESERVISION AREDS PO) Take 1 capsule by mouth 2 (two) times daily.     pantoprazole (PROTONIX) 40 MG tablet Take 1 tablet (40 mg total) by mouth 2 (two) times daily. 180 tablet 1   rosuvastatin (CRESTOR) 5 MG tablet TAKE 1 TABLET BY MOUTH THREE TIMES A WEEK 40 tablet 1   valsartan-hydrochlorothiazide (DIOVAN-HCT) 160-12.5 MG tablet Take 1 tablet by mouth daily. 90 tablet 1   No current facility-administered medications for this visit.    Past Medical History:  Diagnosis Date   Allergy    Arthritis    Carpal tunnel syndrome, bilateral    Diabetes mellitus without complication ([place])    Diverticulosis  Gastric lymphoma (HCC)    Gastroparesis    GERD (gastroesophageal reflux disease)    Hypertension    Hypothyroidism    PUD (peptic ulcer disease)    Skin cancer    Spinal stenosis    lumbar laminectomy    Past Surgical History:  Procedure Laterality Date   ABDOMINAL HYSTERECTOMY     antrectomy and vagotomy  1994   arthroscopic left knee  12/05/08   BACK SURGERY     carpal tunnel repai  10/10   left    CATARACT EXTRACTION, BILATERAL     EYE SURGERY     bilateral cataracts   gastric lymphoma  1996   surgery   LAPAROSCOPIC CHOLECYSTECTOMY  6/01   LUMBAR LAMINECTOMY  1999   LUMBAR LAMINECTOMY WITH COFLEX 1 LEVEL N/A 09/15/2015   Procedure: Lumbar one-two Laminectomy with placement of coflex;  Surgeon: Kristeen Miss, MD;  Location: [facility];  Service:  Neurosurgery;  Laterality: N/A;   PARTIAL HYSTERECTOMY  1960   fibroid tumors, ovaries not removed   tonsillectomy     TONSILLECTOMY       Social History   Tobacco Use   Smoking status: Never   Smokeless tobacco: Never  Vaping Use   Vaping Use: Never used  Substance Use Topics   Alcohol use: No    Alcohol/week: 0.0 standard drinks   Drug use: No       Family History  Problem Relation Age of Onset   Stroke Mother    Stroke Father    Hypertension Father    Diabetes Brother    Multiple sclerosis Brother    Breast cancer Daughter 59   Colon cancer Neg Hx      Allergies  Allergen Reactions   Penicillins Rash    REVIEW OF SYSTEMS (Negative unless checked)   Constitutional: [] Weight loss  [] Fever  [] Chills Cardiac: [] Chest pain   [] Chest pressure   [] Palpitations   [] Shortness of breath when laying flat   [] Shortness of breath at rest   [] Shortness of breath with exertion. Vascular:  [] Pain in legs with walking   [] Pain in legs at rest   [] Pain in legs when laying flat   [] Claudication   [] Pain in feet when walking  [] Pain in feet at rest  [] Pain in feet when laying flat   [] History of DVT   [] Phlebitis   [] Swelling in legs   [] Varicose veins   [] Non-healing ulcers Pulmonary:   [] Uses home oxygen   [] Productive cough   [] Hemoptysis   [] Wheeze  [] COPD   [] Asthma Neurologic:  [] Dizziness  [] Blackouts   [] Seizures   [] History of stroke   [] History of TIA  [] Aphasia   [] Temporary blindness   [] Dysphagia   [] Weakness or numbness in arms   [] Weakness or numbness in legs Musculoskeletal:  [x] Arthritis   [] Joint swelling   [x] Joint pain   [x] Low back pain Hematologic:  [] Easy bruising  [] Easy bleeding   [] Hypercoagulable state   [] Anemic  [] Hepatitis Gastrointestinal:  [] Blood in stool   [] Vomiting blood  [x] Gastroesophageal reflux/heartburn   [] Abdominal pain Genitourinary:  [] Chronic kidney disease   [] Difficult urination  [] Frequent urination  [] Burning with urination    [] Hematuria Skin:  [] Rashes   [] Ulcers   [] Wounds Psychological:  [] History of anxiety   []  History of major depression.     Physical Examination  Vitals:   02/09/21 1541  BP: (!) 143/63  Pulse: 73  Weight: 183 lb (83 kg)  Height: 5\' 3"  (  1.6 m)   Body mass index is 32.42 kg/m. Gen:  WD/WN, NAD. Appears younger than stated age. Head: [place]/AT, No temporalis wasting. Ear/Nose/Throat: Hearing grossly intact, nares w/o erythema or drainage, trachea midline Eyes: Conjunctiva clear. Sclera non-icteric Neck: Supple.  No bruit  Pulmonary:  Good air movement, equal and clear to auscultation bilaterally.  Cardiac: RRR, No JVD Vascular:  Vessel Right Left  Radial Palpable Palpable           Musculoskeletal: M/S 5/5 throughout.  No deformity or atrophy. Mild LE edema. Neurologic: CN 2-12 intact. Sensation grossly intact in extremities.  Symmetrical.  Speech is fluent. Motor exam as listed above. Psychiatric: Judgment intact, Mood & affect appropriate for pt's clinical situation. Dermatologic: No rashes or ulcers noted.  No cellulitis or open wounds.    CBC Lab Results  Component Value Date   WBC 6.7 11/10/2020   HGB 11.3 (L) 11/10/2020   HCT 33.4 (L) 11/10/2020   MCV 91.2 11/10/2020   PLT 195.0 11/10/2020    BMET    Component Value Date/Time   NA 139 10/21/2020 0933   K 4.2 10/21/2020 0933   CL 104 10/21/2020 0933   CO2 27 10/21/2020 0933   GLUCOSE 190 (H) 10/21/2020 0933   BUN 32 (H) 10/21/2020 0933   CREATININE 0.84 10/21/2020 0933   CREATININE 0.94 03/26/2012 0920   CALCIUM 9.8 10/21/2020 0933   GFRNONAA >60 09/09/2015 1422   GFRNONAA 58 (L) 03/26/2012 0920   GFRAA >60 09/09/2015 1422   GFRAA >60 03/26/2012 0920   CrCl cannot be calculated (Patient's most recent lab result is older than the maximum 21 days allowed.).  COAG No results found for: INR, PROTIME  Radiology No results found.   Assessment/Plan Hypercholesterolemia lipid control important in  reducing the progression of atherosclerotic disease. Continue statin therapy.     Hypertension blood pressure control important in reducing the progression of atherosclerotic disease. On appropriate oral medications.  Type II diabetes mellitus (HCC) blood glucose control important in reducing the progression of atherosclerotic disease. Also, involved in wound healing. On appropriate medications.   Carotid artery disease, unspecified laterality ([place]) Carotid duplex today reveals velocities in the 1 to 39% range bilaterally.  No role for intervention at that level.  Continue annual surveillance and her current medical regimen.    Leotis Pain, MD  02/09/2021 4:05 PM    This note was created with Dragon medical transcription system.  Any errors from dictation are purely unintentional

## 2021-02-10 NOTE — Telephone Encounter (Signed)
error 

## 2021-02-14 ENCOUNTER — Encounter: Payer: Self-pay | Admitting: Internal Medicine

## 2021-02-14 ENCOUNTER — Telehealth: Payer: Self-pay | Admitting: Internal Medicine

## 2021-02-14 DIAGNOSIS — N200 Calculus of kidney: Secondary | ICD-10-CM

## 2021-02-14 DIAGNOSIS — Q6211 Congenital occlusion of ureteropelvic junction: Secondary | ICD-10-CM

## 2021-02-14 NOTE — Assessment & Plan Note (Signed)
Follow cbc.  

## 2021-02-14 NOTE — Assessment & Plan Note (Signed)
Miralax

## 2021-02-14 NOTE — Assessment & Plan Note (Signed)
Has seen [facility].  Carotid ultrasound as outlined.  Stable.  Continue crestor.

## 2021-02-14 NOTE — Assessment & Plan Note (Signed)
On thyroid replacement.  Follow tsh. Apparently has varied dose taking as outlined.

## 2021-02-14 NOTE — Assessment & Plan Note (Signed)
Continue valsartan/hctz and amlodipine.  Blood pressure as outlined.  Continue current medications.  Follow pressures.  Follow metabolic panel.

## 2021-02-14 NOTE — Telephone Encounter (Signed)
Bone density ordered.  Need to schedule.  Also, per review of chart, she has seen urology.  The last note I have recommend f/u in one year with possible repeat ultrasound.  She is overdue.  Needs f/u if agreeable.  If she has seen, need records.  (Saw Dr Bernardo Heater)

## 2021-02-14 NOTE — Assessment & Plan Note (Signed)
Has seen urology.  Last evaluated 06/2019.  Per note, had planned f/u in one year with renal ultrasound.  Need to confirm if had f/u .  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

## 2021-02-14 NOTE — Assessment & Plan Note (Signed)
Followed by endocrinology.  Sugars - poorly controlled.  Low carb diet and exercise.  Check met b and a1c.  Up to date with eye exam - no retinopathy 03/2020.  Seeing endocrinology.  Using SSI.  Adjusting.

## 2021-02-14 NOTE — Assessment & Plan Note (Signed)
Upper symptoms controlled.  On protonix.  

## 2021-02-14 NOTE — Assessment & Plan Note (Signed)
History of gastric lymphoma.  Doing well.  Followed by GI.

## 2021-02-14 NOTE — Assessment & Plan Note (Signed)
Continue crestor.  Low cholesterol diet and exercise. Follow lipid panel and liver function tests.   

## 2021-02-16 DIAGNOSIS — J301 Allergic rhinitis due to pollen: Secondary | ICD-10-CM | POA: Diagnosis not present

## 2021-02-16 NOTE — Telephone Encounter (Signed)
DEXA scan scheduled for 3/29 at 1:40. Unable to [facility] for patient

## 2021-02-16 NOTE — Telephone Encounter (Signed)
Patient is aware of below. She is agreeable to do bone density at [facility]. Patient would like to establish with Dr Erlene Quan for urology. I can place referral if okay with you.

## 2021-02-16 NOTE — Telephone Encounter (Signed)
Ok to place referral and ok to order bone density.

## 2021-02-17 DIAGNOSIS — J301 Allergic rhinitis due to pollen: Secondary | ICD-10-CM | POA: Diagnosis not present

## 2021-02-18 NOTE — Telephone Encounter (Signed)
Patient aware of appt date and time. Referral to urology placed.

## 2021-02-18 NOTE — Addendum Note (Signed)
Addended by: Lars Masson on: 02/18/2021 09:42 AM   Modules accepted: Orders

## 2021-02-23 DIAGNOSIS — J301 Allergic rhinitis due to pollen: Secondary | ICD-10-CM | POA: Diagnosis not present

## 2021-02-25 ENCOUNTER — Other Ambulatory Visit: Payer: Self-pay | Admitting: *Deleted

## 2021-02-25 DIAGNOSIS — N200 Calculus of kidney: Secondary | ICD-10-CM

## 2021-02-25 NOTE — Progress Notes (Signed)
02/26/21 6:43 PM   [name] [birth year] [id number]  Referring provider:  Einar Pheasant, [address] [place],  [place] [address] Chief Complaint  Patient presents with   Nephrolithiasis     HPI: Marissa Huff is a 88 y.o.female with a personal history of right UPJ obstruction, nephrolithiasis, and recurrent UTIs, who presents today for further evaluation of hydronephrosis with UPJ obstruction.   She was previously a patient of Dr. Bernardo Heater; she was last seen in clinic in 2021. CT abdomen/pelvis in 04/2018 visualized  right hydronephrosis with 10/12 mm renal calculi. Lasix renogram on 06/2018 obstruction present with differential function 39% right kidney. At time she was asymptomatic dating back to at least 2014 when she was a patient of Dr.Cope.   She reports that she was originally a patient with Dr.Cope and was transferred to [place]. She reports that she was told she needed to undergo a CT but she has not gotten one which she wants to receive. She has not had kidney stones since Dr. Jacqlyn Larsen. She reports that she had a UTI years ago and denies any urinary symptom.   She denies any flank pain or gross hematuria.  PMH: 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HCC)    Gastroparesis    GERD (gastroesophageal reflux disease)    Hypertension    Hypothyroidism    PUD (peptic ulcer disease)    Skin cancer    Spinal stenosis    lumbar laminectomy    Surgical History: Past Surgical History:  Procedure Laterality Date   ABDOMINAL HYSTERECTOMY     antrectomy and vagotomy  1994   arthroscopic left knee  12/05/08   BACK SURGERY     carpal tunnel repai  10/10   left    CATARACT EXTRACTION, BILATERAL     EYE SURGERY     bilateral cataracts   gastric lymphoma  1996   surgery   LAPAROSCOPIC  CHOLECYSTECTOMY  6/01   LUMBAR LAMINECTOMY  1999   LUMBAR LAMINECTOMY WITH COFLEX 1 LEVEL N/A 09/15/2015   Procedure: Lumbar one-two Laminectomy with placement of coflex;  Surgeon: Kristeen Miss, MD;  Location: [facility];  Service: Neurosurgery;  Laterality: N/A;   PARTIAL HYSTERECTOMY  1960   fibroid tumors, ovaries not removed   tonsillectomy     TONSILLECTOMY      Home Medications:  Allergies as of 02/26/2021       Reactions   Penicillins Rash        Medication List        Accurate as of February 26, 2021  6:43 PM. If you have any questions, ask your nurse or doctor.          albuterol 108 (90 Base) MCG/ACT inhaler Commonly known as: VENTOLIN HFA Inhale 2 puffs into the lungs every 6 (six) hours as needed for wheezing or shortness of breath.   amLODipine 5 MG tablet Commonly known as: NORVASC Take 1 tablet (5 mg total) by mouth daily.   aspirin 81 MG tablet Take 81 mg by mouth daily.   BD Pen Needle Nano U/F 32G X 4 MM Misc Generic drug: Insulin Pen Needle USE 1 EACH 4 (FOUR) TIMES DAILY   CALCIUM + D PO Take 1 tablet by mouth daily.   celecoxib 200 MG capsule  Commonly known as: CELEBREX Take by mouth 2 (two) times daily.   doxylamine (Sleep) 25 MG tablet Commonly known as: UNISOM Take 1 tablet by mouth at bedtime as needed.   fexofenadine 180 MG tablet Commonly known as: ALLEGRA Take 180 mg by mouth daily.   fluticasone 50 MCG/ACT nasal spray Commonly known as: FLONASE SPRAY 2 SPRAYS INTO EACH NOSTRIL EVERY DAY   FreeStyle Libre 14 Day Reader [facility] Use 1 each as needed .  Use to test blood sugar   FreeStyle Libre 14 Day Sensor Misc Use 1 each every 14 (fourteen) days   glucagon 1 MG injection Inject 1 mg into the muscle once as needed.   glucose blood test strip   ipratropium 0.06 % nasal spray Commonly known as: ATROVENT PLACE 2 SPRAYS INTO BOTH NOSTRILS 4 (FOUR) TIMES DAILY AS NEEDED FOR RHINITIS.   Lantus SoloStar 100 UNIT/ML  Solostar Pen Generic drug: insulin glargine Inject 28-30 Units into the skin at bedtime.   levothyroxine 175 MCG tablet Commonly known as: SYNTHROID Take 1 tablet (175 mcg total) by mouth daily.   LORazepam 0.5 MG tablet Commonly known as: ATIVAN Take 0.5-1 tablets (0.25-0.5 mg total) by mouth daily as needed for anxiety.   metFORMIN 500 MG 24 hr tablet Commonly known as: GLUCOPHAGE-XR Take 1 tablet (500 mg total) by mouth 2 (two) times daily.   MULTIVITAMIN PO Take 1 tablet by mouth daily.   NovoLOG FlexPen 100 UNIT/ML FlexPen Generic drug: insulin aspart Inject 10 Units into the skin in the morning, at noon, and at bedtime.   pantoprazole 40 MG tablet Commonly known as: PROTONIX Take 1 tablet (40 mg total) by mouth 2 (two) times daily.   PRESERVISION AREDS PO Take 1 capsule by mouth 2 (two) times daily.   rosuvastatin 5 MG tablet Commonly known as: CRESTOR TAKE 1 TABLET BY MOUTH THREE TIMES A WEEK   valsartan-hydrochlorothiazide 160-12.5 MG tablet Commonly known as: DIOVAN-HCT Take 1 tablet by mouth daily.        Allergies:  Allergies  Allergen Reactions   Penicillins Rash    Family History: Family History  Problem Relation Age of Onset   Stroke Mother    Stroke Father    Hypertension Father    Diabetes Brother    Multiple sclerosis Brother    Breast cancer Daughter 93   Colon cancer Neg Hx     Social History:  reports that she has never smoked. She has never used smokeless tobacco. She reports that she does not drink alcohol and does not use drugs.   Physical Exam: BP (!) 142/88    Pulse 86    Ht 5\' 3"  (1.6 m)    Wt 183 lb (83 kg)    BMI 32.42 kg/m   Constitutional:  Alert and oriented, No acute distress. HEENT: [place] AT, moist mucus membranes.  Trachea midline, no masses. Cardiovascular: No clubbing, cyanosis, or edema. Respiratory: Normal respiratory effort, no increased work of breathing. Skin: No rashes, bruises or suspicious  lesions. Neurologic: Grossly intact, no focal deficits, moving all 4 extremities. Psychiatric: Normal mood and affect.  Laboratory Data: Lab Results  Component Value Date   CREATININE 0.84 10/21/2020   Lab Results  Component Value Date   HGBA1C 7.5 (H) 10/21/2020    Urinalysis Component     Latest Ref Rng & Units 02/26/2021  Color, Urine     YELLOW YELLOW  Appearance     CLEAR CLOUDY (A)  Specific Gravity, Urine  1.005 - 1.030 1.025  pH     5.0 - 8.0 5.0  Glucose, UA     NEGATIVE mg/dL NEGATIVE  Hgb urine dipstick     NEGATIVE TRACE (A)  Bilirubin Urine     NEGATIVE NEGATIVE  Ketones, ur     NEGATIVE mg/dL TRACE (A)  Protein     NEGATIVE mg/dL TRACE (A)  Nitrite     NEGATIVE POSITIVE (A)  Leukocytes,Ua     NEGATIVE SMALL (A)  Squamous Epithelial / LPF     0 - 5 11-20  WBC, UA     0 - 5 WBC/hpf >50  RBC / HPF     0 - 5 RBC/hpf 6-10  Bacteria, UA     NONE SEEN MANY (A)  WBC Clumps      PRESENT  Mucus      PRESENT    Assessment & Plan:    Bacteriuria  - Asymptomatic. If she starts experiencing UTI symptoms she should follow-up  -  Urine today is grossly infected  - Will send urine for culture but will not treat unless she develops dysuria, urgency frequency or any other UTI specific symptoms, she understands this and is aware  2. Right hydronephrosis  - Secondary to UPJ obstruction being managed conservatively -Asymptomatic  - Agree with Dr.Stoioff that a RUS is warranted  - RUS; scheduled   Return in 1 year for follow-up with PA-C   Conley Rolls as a scribe for Hollice Espy, MD.,have documented all relevant documentation on the behalf of Hollice Espy, MD,as directed by  Hollice Espy, MD while in the presence of Hollice Espy, MD.  I have reviewed the above documentation for accuracy and completeness, and I agree with the above.   Hollice Espy, MD   [facility] [address] [place], [place] [address] [phone]  I spent 30 total minutes on the day of the encounter including pre-visit review of the medical record, face-to-face time with the patient, and post visit ordering of labs/imaging/tests.  This patient was new to me and thus I spent the majority of time reviewing her previous studies including Lasix renal study, previous notes as well as notes in care everywhere from Dr. Jacqlyn Larsen.

## 2021-02-26 ENCOUNTER — Encounter: Payer: Self-pay | Admitting: Urology

## 2021-02-26 ENCOUNTER — Other Ambulatory Visit
Admission: RE | Admit: 2021-02-26 | Discharge: 2021-02-26 | Disposition: A | Discharge status: Home / Self Care | Payer: PPO | Attending: Urology | Admitting: Urology

## 2021-02-26 ENCOUNTER — Other Ambulatory Visit: Payer: Self-pay

## 2021-02-26 ENCOUNTER — Ambulatory Visit: Payer: PPO | Admitting: Urology

## 2021-02-26 VITALS — BP 142/88 | HR 86 | Ht 63.0 in | Wt 183.0 lb

## 2021-02-26 DIAGNOSIS — Q6211 Congenital occlusion of ureteropelvic junction: Secondary | ICD-10-CM | POA: Diagnosis not present

## 2021-02-26 DIAGNOSIS — N390 Urinary tract infection, site not specified: Secondary | ICD-10-CM | POA: Diagnosis not present

## 2021-02-26 DIAGNOSIS — N200 Calculus of kidney: Secondary | ICD-10-CM | POA: Diagnosis not present

## 2021-02-26 LAB — URINALYSIS, COMPLETE (UACMP) WITH MICROSCOPIC
Bilirubin Urine: NEGATIVE
Glucose, UA: NEGATIVE mg/dL
Nitrite: POSITIVE — AB
Specific Gravity, Urine: 1.025 (ref 1.005–1.030)
WBC, UA: >50 WBC/hpf (ref 0–5)
pH: 5 (ref 5.0–8.0)

## 2021-03-01 LAB — URINE CULTURE: Culture: >=100000 — AB

## 2021-03-02 DIAGNOSIS — J301 Allergic rhinitis due to pollen: Secondary | ICD-10-CM | POA: Diagnosis not present

## 2021-03-04 ENCOUNTER — Other Ambulatory Visit (INDEPENDENT_AMBULATORY_CARE_PROVIDER_SITE_OTHER): Payer: PPO

## 2021-03-04 ENCOUNTER — Other Ambulatory Visit: Payer: Self-pay

## 2021-03-04 DIAGNOSIS — E7800 Pure hypercholesterolemia, unspecified: Secondary | ICD-10-CM

## 2021-03-04 DIAGNOSIS — D649 Anemia, unspecified: Secondary | ICD-10-CM | POA: Diagnosis not present

## 2021-03-04 DIAGNOSIS — E1065 Type 1 diabetes mellitus with hyperglycemia: Secondary | ICD-10-CM

## 2021-03-04 DIAGNOSIS — I10 Essential (primary) hypertension: Secondary | ICD-10-CM

## 2021-03-04 LAB — IBC + FERRITIN
Ferritin: 19.9 ng/mL (ref 10.0–291.0)
Iron: 125 ug/dL (ref 42–145)
Saturation Ratios: 38.3 % (ref 20.0–50.0)
TIBC: 326.2 ug/dL (ref 250.0–450.0)
Transferrin: 233 mg/dL (ref 212.0–360.0)

## 2021-03-04 LAB — LIPID PANEL
Cholesterol: 118 mg/dL (ref 0–200)
HDL: 50.3 mg/dL (ref >39.00)
LDL Cholesterol: 51 mg/dL (ref 0–99)
NonHDL: 68.08
Total CHOL/HDL Ratio: 2
Triglycerides: 85 mg/dL (ref 0.0–149.0)
VLDL: 17 mg/dL (ref 0.0–40.0)

## 2021-03-04 LAB — VITAMIN B12: Vitamin B-12: 442 pg/mL (ref 211–911)

## 2021-03-04 LAB — BASIC METABOLIC PANEL
BUN: 24 mg/dL — ABNORMAL HIGH (ref 6–23)
CO2: 30 mEq/L (ref 19–32)
Calcium: 9.5 mg/dL (ref 8.4–10.5)
Chloride: 103 mEq/L (ref 96–112)
Creatinine, Ser: 0.9 mg/dL (ref 0.40–1.20)
GFR: 57.29 mL/min — ABNORMAL LOW (ref >60.00)
Glucose, Bld: 177 mg/dL — ABNORMAL HIGH (ref 70–99)
Potassium: 3.9 mEq/L (ref 3.5–5.1)
Sodium: 140 mEq/L (ref 135–145)

## 2021-03-04 LAB — CBC WITH DIFFERENTIAL/PLATELET
Basophils Absolute: 0 10*3/uL (ref 0.0–0.1)
Basophils Relative: 0.8 % (ref 0.0–3.0)
Eosinophils Absolute: 0.2 10*3/uL (ref 0.0–0.7)
Eosinophils Relative: 3.9 % (ref 0.0–5.0)
HCT: 35.8 % — ABNORMAL LOW (ref 36.0–46.0)
Hemoglobin: 12 g/dL (ref 12.0–15.0)
Lymphocytes Relative: 24.3 % (ref 12.0–46.0)
Lymphs Abs: 1.2 10*3/uL (ref 0.7–4.0)
MCHC: 33.4 g/dL (ref 30.0–36.0)
MCV: 93.1 fl (ref 78.0–100.0)
Monocytes Absolute: 0.5 10*3/uL (ref 0.1–1.0)
Monocytes Relative: 9.6 % (ref 3.0–12.0)
Neutro Abs: 3 10*3/uL (ref 1.4–7.7)
Neutrophils Relative %: 61.4 % (ref 43.0–77.0)
Platelets: 203 10*3/uL (ref 150.0–400.0)
RBC: 3.85 Mil/uL — ABNORMAL LOW (ref 3.87–5.11)
RDW: 14.3 % (ref 11.5–15.5)
WBC: 4.9 10*3/uL (ref 4.0–10.5)

## 2021-03-04 LAB — HEPATIC FUNCTION PANEL
ALT: 18 U/L (ref 0–35)
AST: 17 U/L (ref 0–37)
Albumin: 4 g/dL (ref 3.5–5.2)
Alkaline Phosphatase: 76 U/L (ref 39–117)
Bilirubin, Direct: 0.1 mg/dL (ref 0.0–0.3)
Total Bilirubin: 0.5 mg/dL (ref 0.2–1.2)
Total Protein: 6.4 g/dL (ref 6.0–8.3)

## 2021-03-04 LAB — TSH: TSH: 11.44 u[IU]/mL — ABNORMAL HIGH (ref 0.35–5.50)

## 2021-03-04 LAB — HEMOGLOBIN A1C: Hgb A1c MFr Bld: 8.1 % — ABNORMAL HIGH (ref 4.6–6.5)

## 2021-03-05 ENCOUNTER — Ambulatory Visit (INDEPENDENT_AMBULATORY_CARE_PROVIDER_SITE_OTHER): Payer: PPO | Admitting: Internal Medicine

## 2021-03-05 DIAGNOSIS — I10 Essential (primary) hypertension: Secondary | ICD-10-CM | POA: Diagnosis not present

## 2021-03-05 DIAGNOSIS — D649 Anemia, unspecified: Secondary | ICD-10-CM | POA: Diagnosis not present

## 2021-03-05 DIAGNOSIS — E1122 Type 2 diabetes mellitus with diabetic chronic kidney disease: Secondary | ICD-10-CM | POA: Diagnosis not present

## 2021-03-05 DIAGNOSIS — N182 Chronic kidney disease, stage 2 (mild): Secondary | ICD-10-CM | POA: Diagnosis not present

## 2021-03-05 DIAGNOSIS — E038 Other specified hypothyroidism: Secondary | ICD-10-CM

## 2021-03-05 DIAGNOSIS — E1065 Type 1 diabetes mellitus with hyperglycemia: Secondary | ICD-10-CM | POA: Diagnosis not present

## 2021-03-05 DIAGNOSIS — F419 Anxiety disorder, unspecified: Secondary | ICD-10-CM

## 2021-03-05 DIAGNOSIS — E7800 Pure hypercholesterolemia, unspecified: Secondary | ICD-10-CM | POA: Diagnosis not present

## 2021-03-05 MED ORDER — LEVOTHYROXINE SODIUM 200 MCG PO TABS: 200.0000 ug | ORAL_TABLET | Freq: Every day | ORAL | 2 refills | Status: DC

## 2021-03-05 MED ORDER — SERTRALINE HCL 25 MG PO TABS: 25.0000 mg | ORAL_TABLET | Freq: Every day | ORAL | 2 refills | Status: DC

## 2021-03-05 NOTE — Progress Notes (Signed)
Patient ID: Marissa Huff, female   DOB: March 06, 1931, 88 y.o.   MRN: [id number]   Virtual Visit via telephone Note  This visit type was conducted due to national recommendations for restrictions regarding the COVID-19 pandemic (e.g. social distancing).  This format is felt to be most appropriate for this patient at this time.  All issues noted in this document were discussed and addressed.  No physical exam was performed (except for noted visual exam findings with Video Visits).   I connected with Marissa Huff by telephone and verified that I am speaking with the correct person using two identifiers. Location patient: home Location provider: work  Persons participating in the telephone visit: patient, provider  The limitations, risks, security and privacy concerns of performing an evaluation and management service by telephone and the availability of in person appointments have been discussed.  It has also been discussed with the patient that there may be a patient responsible charge related to this service. The patient expressed understanding and agreed to proceed.   Reason for visit: work in appt  HPI: Work in to discuss labs.  Also with increased anxiety.  Discussed elevated TSH.  She is taking her thyroid medication correctly.  Discussed changing dose.  Discussed elevated a1c - 8.1.  seeing endocrinology.  Discussed insulin dosing and SSI.  Discussed decreased GFR.  She is taking celebrex - prescribed by ortho.  Discussed need to limit amount of antiinflammatory medication taking.  No chest pain or sob reported.  No abdominal pain or bowel change reported.  Increased anxiety.  Has noticed more recently.  Increased stress related to getting her license, etc.  Husband also reports noticing more anxiety.  Feels occurring more regular.  Discussed medication.  Would like something to help level things out.     ROS: See pertinent positives and negatives per HPI.  Past Medical History:  Diagnosis  Date   Allergy    Arthritis    Carpal tunnel syndrome, bilateral    Diabetes mellitus without complication (HCC)    Diverticulosis    Gastric lymphoma ([facility])    Gastroparesis    GERD (gastroesophageal reflux disease)    Hypertension    Hypothyroidism    PUD (peptic ulcer disease)    Skin cancer    Spinal stenosis    lumbar laminectomy    Past Surgical History:  Procedure Laterality Date   ABDOMINAL HYSTERECTOMY     antrectomy and vagotomy  1994   arthroscopic left knee  12/05/08   BACK SURGERY     carpal tunnel repai  10/10   left    CATARACT EXTRACTION, BILATERAL     EYE SURGERY     bilateral cataracts   gastric lymphoma  1996   surgery   LAPAROSCOPIC CHOLECYSTECTOMY  6/01   LUMBAR LAMINECTOMY  1999   LUMBAR LAMINECTOMY WITH COFLEX 1 LEVEL N/A 09/15/2015   Procedure: Lumbar one-two Laminectomy with placement of coflex;  Surgeon: Kristeen Miss, MD;  Location: [facility];  Service: Neurosurgery;  Laterality: N/A;   PARTIAL HYSTERECTOMY  1960   fibroid tumors, ovaries not removed   tonsillectomy     TONSILLECTOMY      Family History  Problem Relation Age of Onset   Stroke Mother    Stroke Father    Hypertension Father    Diabetes Brother    Multiple sclerosis Brother    Breast cancer Daughter 30   Colon cancer Neg Hx     SOCIAL HX: reviewed.  Current Outpatient Medications:    albuterol (VENTOLIN HFA) 108 (90 Base) MCG/ACT inhaler, Inhale 2 puffs into the lungs every 6 (six) hours as needed for wheezing or shortness of breath., Disp: 1 each, Rfl: 1   amLODipine (NORVASC) 5 MG tablet, Take 1 tablet (5 mg total) by mouth daily., Disp: 90 tablet, Rfl: 1   aspirin 81 MG tablet, Take 81 mg by mouth daily., Disp: , Rfl:    BD PEN NEEDLE NANO U/F 32G X 4 MM MISC, USE 1 EACH 4 (FOUR) TIMES DAILY, Disp: , Rfl:    Calcium Citrate-Vitamin D (CALCIUM + D PO), Take 1 tablet by mouth daily., Disp: , Rfl:    celecoxib (CELEBREX) 200 MG capsule, Take by mouth 2 (two) times  daily., Disp: , Rfl:    Continuous Blood Gluc Receiver (FREESTYLE LIBRE 14 DAY READER) DEVI, Use 1 each as needed .  Use to test blood sugar, Disp: , Rfl:    Continuous Blood Gluc Sensor (FREESTYLE LIBRE 14 DAY SENSOR) MISC, Use 1 each every 14 (fourteen) days, Disp: , Rfl:    doxylamine, Sleep, (UNISOM) 25 MG tablet, Take 1 tablet by mouth at bedtime as needed., Disp: , Rfl:    fexofenadine (ALLEGRA) 180 MG tablet, Take 180 mg by mouth daily., Disp: , Rfl:    fluticasone (FLONASE) 50 MCG/ACT nasal spray, SPRAY 2 SPRAYS INTO EACH NOSTRIL EVERY DAY, Disp: 48 mL, Rfl: 3   glucagon (GLUCAGON EMERGENCY) 1 MG injection, Inject 1 mg into the muscle once as needed., Disp: 1 each, Rfl: 3   glucose blood test strip, , Disp: , Rfl:    insulin aspart (NOVOLOG FLEXPEN) 100 UNIT/ML FlexPen, Inject 10 Units into the skin in the morning, at noon, and at bedtime., Disp: , Rfl:    ipratropium (ATROVENT) 0.06 % nasal spray, PLACE 2 SPRAYS INTO BOTH NOSTRILS 4 (FOUR) TIMES DAILY AS NEEDED FOR RHINITIS., Disp: 15 mL, Rfl: 1   LANTUS SOLOSTAR 100 UNIT/ML Solostar Pen, Inject 28-30 Units into the skin at bedtime., Disp: , Rfl: 4   levothyroxine (SYNTHROID) 200 MCG tablet, Take 1 tablet (200 mcg total) by mouth daily before breakfast., Disp: 30 tablet, Rfl: 2   LORazepam (ATIVAN) 0.5 MG tablet, Take 0.5-1 tablets (0.25-0.5 mg total) by mouth daily as needed for anxiety., Disp: 30 tablet, Rfl: 0   metFORMIN (GLUCOPHAGE-XR) 500 MG 24 hr tablet, Take 1 tablet (500 mg total) by mouth 2 (two) times daily., Disp: 180 tablet, Rfl: 1   Multiple Vitamins-Minerals (MULTIVITAMIN PO), Take 1 tablet by mouth daily. , Disp: , Rfl:    Multiple Vitamins-Minerals (PRESERVISION AREDS PO), Take 1 capsule by mouth 2 (two) times daily., Disp: , Rfl:    pantoprazole (PROTONIX) 40 MG tablet, Take 1 tablet (40 mg total) by mouth 2 (two) times daily., Disp: 180 tablet, Rfl: 1   rosuvastatin (CRESTOR) 5 MG tablet, TAKE 1 TABLET BY MOUTH THREE  TIMES A WEEK, Disp: 40 tablet, Rfl: 1   sertraline (ZOLOFT) 25 MG tablet, Take 1 tablet (25 mg total) by mouth daily., Disp: 30 tablet, Rfl: 2   valsartan-hydrochlorothiazide (DIOVAN-HCT) 160-12.5 MG tablet, Take 1 tablet by mouth daily., Disp: 90 tablet, Rfl: 1  EXAM:  GENERAL: alert. Sounds to be in no acute distress.  Answering questions appropriately.   PSYCH/NEURO: pleasant and cooperative, no obvious depression or anxiety, speech and thought processing grossly intact  ASSESSMENT AND PLAN:  Discussed the following assessment and plan:  Problem List Items Addressed This Visit  Anemia    Recent cbc wnl.       Anxiety    Increased anxiety and stress as outlined.  Discussed treatment options.  Start low dose zoloft 52m q day.  Follow.       Relevant Medications   sertraline (ZOLOFT) 25 MG tablet   Hypercholesterolemia    Continue crestor.  Low cholesterol diet and exercise.  Follow lipid panel and liver function tests.   Lab Results  Component Value Date   CHOL 118 03/04/2021   HDL 50.30 03/04/2021   LDLCALC 51 03/04/2021   TRIG 85.0 03/04/2021   CHOLHDL 2 03/04/2021       Hypertension    Continue valsartan/hctz and amlodipine.  Continue current medications.  Follow pressures.  Follow metabolic panel.       Hypothyroidism    Recent elevated TSH.  Increased synthroid to 2025m q day.  Recheck tsh in 6 weeks.       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  Discussed celebrex - prescribed by ortho.  Discussed limiting amount.       Type 1 diabetes mellitus with hyperglycemia ([facility]   Followed by endocrinology.  Sugars - poorly controlled.  Discussed low carb diet and exercise.  Discussed recent met b and a1c.  Up to date with eye exam - no retinopathy 03/2020.  Seeing endocrinology.  Using SSI.  Adjusting.  Forward recent labs to endocrinology.         Return in about 6 weeks (around 04/16/2021) for follow up appt (3035m.   I discussed the assessment and treatment plan with the patient. The patient was provided an opportunity to ask questions and all were answered. The patient agreed with the plan and demonstrated an understanding of the instructions.   The patient was advised to call back or seek an in-person evaluation if the symptoms worsen or if the condition fails to improve as anticipated.  I provided 25 minutes of non-face-to-face time during this encounter.   ChaEinar PheasantD

## 2021-03-06 ENCOUNTER — Telehealth: Payer: Self-pay | Admitting: Internal Medicine

## 2021-03-06 ENCOUNTER — Encounter: Payer: Self-pay | Admitting: Internal Medicine

## 2021-03-06 DIAGNOSIS — F419 Anxiety disorder, unspecified: Secondary | ICD-10-CM | POA: Insufficient documentation

## 2021-03-06 NOTE — Assessment & Plan Note (Signed)
Continue valsartan/hctz and amlodipine.  Continue current medications.  Follow pressures.  Follow metabolic panel.  

## 2021-03-06 NOTE — Assessment & Plan Note (Signed)
Discussed recent GFR.  Discussed celebrex - prescribed by ortho.  Discussed limiting amount.  ?

## 2021-03-06 NOTE — Assessment & Plan Note (Signed)
Recent cbc wnl.  ?

## 2021-03-06 NOTE — Assessment & Plan Note (Signed)
Continue crestor.  Low cholesterol diet and exercise.  Follow lipid panel and liver function tests.   ?Lab Results  ?Component Value Date  ? CHOL 118 03/04/2021  ? HDL 50.30 03/04/2021  ? LDLCALC 51 03/04/2021  ? TRIG 85.0 03/04/2021  ? CHOLHDL 2 03/04/2021  ? ?

## 2021-03-06 NOTE — Assessment & Plan Note (Signed)
Recent elevated TSH.  Increased synthroid to 263mg q day.  Recheck tsh in 6 weeks.  ?

## 2021-03-06 NOTE — Assessment & Plan Note (Signed)
Increased anxiety and stress as outlined.  Discussed treatment options.  Start low dose zoloft '25mg'$  q day.  Follow.  ?

## 2021-03-06 NOTE — Assessment & Plan Note (Signed)
Followed by endocrinology.  Sugars - poorly controlled.  Discussed low carb diet and exercise.  Discussed recent met b and a1c.  Up to date with eye exam - no retinopathy 03/2020.  Seeing endocrinology.  Using SSI.  Adjusting.  Forward recent labs to endocrinology.  ?

## 2021-03-06 NOTE — Telephone Encounter (Signed)
Please schedule a 6 week f/u appt.  ?

## 2021-03-09 DIAGNOSIS — J301 Allergic rhinitis due to pollen: Secondary | ICD-10-CM | POA: Diagnosis not present

## 2021-03-16 DIAGNOSIS — J301 Allergic rhinitis due to pollen: Secondary | ICD-10-CM | POA: Diagnosis not present

## 2021-03-23 DIAGNOSIS — J301 Allergic rhinitis due to pollen: Secondary | ICD-10-CM | POA: Diagnosis not present

## 2021-03-25 ENCOUNTER — Ambulatory Visit
Admission: RE | Admit: 2021-03-25 | Discharge: 2021-03-25 | Disposition: A | Discharge status: Home / Self Care | Payer: PPO | Source: Ambulatory Visit | Attending: Urology | Admitting: Urology

## 2021-03-25 DIAGNOSIS — Q6211 Congenital occlusion of ureteropelvic junction: Secondary | ICD-10-CM | POA: Insufficient documentation

## 2021-03-25 DIAGNOSIS — N1330 Unspecified hydronephrosis: Secondary | ICD-10-CM | POA: Diagnosis not present

## 2021-03-29 ENCOUNTER — Other Ambulatory Visit: Payer: Self-pay | Admitting: Internal Medicine

## 2021-03-30 DIAGNOSIS — J301 Allergic rhinitis due to pollen: Secondary | ICD-10-CM | POA: Diagnosis not present

## 2021-03-31 ENCOUNTER — Ambulatory Visit
Admission: RE | Admit: 2021-03-31 | Discharge: 2021-03-31 | Disposition: A | Discharge status: Home / Self Care | Payer: PPO | Source: Ambulatory Visit | Attending: Internal Medicine | Admitting: Internal Medicine

## 2021-03-31 DIAGNOSIS — E039 Hypothyroidism, unspecified: Secondary | ICD-10-CM | POA: Insufficient documentation

## 2021-03-31 DIAGNOSIS — Z13820 Encounter for screening for osteoporosis: Secondary | ICD-10-CM | POA: Insufficient documentation

## 2021-03-31 DIAGNOSIS — E119 Type 2 diabetes mellitus without complications: Secondary | ICD-10-CM | POA: Diagnosis not present

## 2021-03-31 DIAGNOSIS — M85832 Other specified disorders of bone density and structure, left forearm: Secondary | ICD-10-CM | POA: Diagnosis not present

## 2021-03-31 DIAGNOSIS — M8589 Other specified disorders of bone density and structure, multiple sites: Secondary | ICD-10-CM | POA: Diagnosis not present

## 2021-03-31 DIAGNOSIS — Z8639 Personal history of other endocrine, nutritional and metabolic disease: Secondary | ICD-10-CM | POA: Insufficient documentation

## 2021-03-31 DIAGNOSIS — Z780 Asymptomatic menopausal state: Secondary | ICD-10-CM | POA: Insufficient documentation

## 2021-04-05 ENCOUNTER — Encounter: Payer: Self-pay | Admitting: *Deleted

## 2021-04-05 DIAGNOSIS — H353132 Nonexudative age-related macular degeneration, bilateral, intermediate dry stage: Secondary | ICD-10-CM | POA: Diagnosis not present

## 2021-04-06 DIAGNOSIS — J301 Allergic rhinitis due to pollen: Secondary | ICD-10-CM | POA: Diagnosis not present

## 2021-04-13 DIAGNOSIS — J301 Allergic rhinitis due to pollen: Secondary | ICD-10-CM | POA: Diagnosis not present

## 2021-04-20 DIAGNOSIS — J301 Allergic rhinitis due to pollen: Secondary | ICD-10-CM | POA: Diagnosis not present

## 2021-04-21 ENCOUNTER — Encounter: Payer: Self-pay | Admitting: Internal Medicine

## 2021-04-21 ENCOUNTER — Ambulatory Visit (INDEPENDENT_AMBULATORY_CARE_PROVIDER_SITE_OTHER): Payer: PPO | Admitting: Internal Medicine

## 2021-04-21 VITALS — BP 140/80 | HR 68 | Temp 98.6°F | Resp 18 | Ht 63.0 in | Wt 183.0 lb

## 2021-04-21 DIAGNOSIS — C8599 Non-Hodgkin lymphoma, unspecified, extranodal and solid organ sites: Secondary | ICD-10-CM | POA: Diagnosis not present

## 2021-04-21 DIAGNOSIS — K219 Gastro-esophageal reflux disease without esophagitis: Secondary | ICD-10-CM | POA: Diagnosis not present

## 2021-04-21 DIAGNOSIS — M4800 Spinal stenosis, site unspecified: Secondary | ICD-10-CM

## 2021-04-21 DIAGNOSIS — F419 Anxiety disorder, unspecified: Secondary | ICD-10-CM

## 2021-04-21 DIAGNOSIS — I10 Essential (primary) hypertension: Secondary | ICD-10-CM | POA: Diagnosis not present

## 2021-04-21 DIAGNOSIS — N182 Chronic kidney disease, stage 2 (mild): Secondary | ICD-10-CM

## 2021-04-21 DIAGNOSIS — E1122 Type 2 diabetes mellitus with diabetic chronic kidney disease: Secondary | ICD-10-CM | POA: Diagnosis not present

## 2021-04-21 DIAGNOSIS — E038 Other specified hypothyroidism: Secondary | ICD-10-CM

## 2021-04-21 DIAGNOSIS — E1065 Type 1 diabetes mellitus with hyperglycemia: Secondary | ICD-10-CM | POA: Diagnosis not present

## 2021-04-21 DIAGNOSIS — I779 Disorder of arteries and arterioles, unspecified: Secondary | ICD-10-CM | POA: Diagnosis not present

## 2021-04-21 DIAGNOSIS — R944 Abnormal results of kidney function studies: Secondary | ICD-10-CM

## 2021-04-21 DIAGNOSIS — E7800 Pure hypercholesterolemia, unspecified: Secondary | ICD-10-CM

## 2021-04-21 LAB — TSH: TSH: 0.8 u[IU]/mL (ref 0.35–5.50)

## 2021-04-21 LAB — BASIC METABOLIC PANEL
BUN: 26 mg/dL — ABNORMAL HIGH (ref 6–23)
CO2: 26 mEq/L (ref 19–32)
Calcium: 9.1 mg/dL (ref 8.4–10.5)
Chloride: 102 mEq/L (ref 96–112)
Creatinine, Ser: 0.92 mg/dL (ref 0.40–1.20)
GFR: 55.75 mL/min — ABNORMAL LOW (ref >60.00)
Glucose, Bld: 201 mg/dL — ABNORMAL HIGH (ref 70–99)
Potassium: 3.9 mEq/L (ref 3.5–5.1)
Sodium: 137 mEq/L (ref 135–145)

## 2021-04-21 NOTE — Progress Notes (Signed)
Patient ID: Marissa Huff, female   DOB: [birth year], 88 y.o.   MRN: [id number] ? ? ?Subjective:  ? ? Patient ID: Marissa Huff, female    DOB: 08/17/1931, 88 y.o.   MRN: [id number] ? ?This visit occurred during the SARS-CoV-2 public health emergency.  Safety protocols were in place, including screening questions prior to the visit, additional usage of staff PPE, and extensive cleaning of exam room while observing appropriate contact time as indicated for disinfecting solutions.  ? ?Patient here for a scheduled follow up.  ? ?Chief Complaint  ?Patient presents with  ? Follow-up  ?  6 wk follow up - recheck tsh.  ? .  ? ?HPI ?Recently started zoloft - increased stress.  She does not feel zoloft has made a difference in how she feels.  Feels doing ok now.  Would like to taper off.  No chest pain.  Breathing stable.  Does report issues with cough - swallowing.  No vomiting.  Discussed celebrex usage.  Discussed need to minimize amount of celebrex she uses.  No abdominal pain.  Bowels stable.  Some persistent back and knee pain.  Sees ortho.   ? ? ?Past Medical History:  ?Diagnosis Date  ? Allergy   ? Arthritis   ? Carpal tunnel syndrome, bilateral   ? Diabetes mellitus without complication ([place])   ? Diverticulosis   ? 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 Gastroparesis   ? GERD (gastroesophageal reflux disease)   ? Hypertension   ? Hypothyroidism   ? PUD (peptic ulcer disease)   ? Skin cancer   ? Spinal stenosis   ? lumbar laminectomy  ? ?Past Surgical History:  ?Procedure Laterality Date  ? ABDOMINAL HYSTERECTOMY    ? antrectomy and vagotomy  1994  ? arthroscopic left knee  12/05/08  ? BACK SURGERY    ? carpal tunnel repai  10/10  ? left   ? CATARACT EXTRACTION, BILATERAL    ? EYE SURGERY    ? bilateral cataracts  ? gastric lymphoma  1996  ? surgery  ? LAPAROSCOPIC CHOLECYSTECTOMY  6/01  ? LUMBAR LAMINECTOMY  1999  ? LUMBAR LAMINECTOMY WITH COFLEX 1 LEVEL N/A 09/15/2015  ? Procedure: Lumbar one-two Laminectomy with placement of  coflex;  Surgeon: Kristeen Miss, MD;  Location: [facility];  Service: Neurosurgery;  Laterality: N/A;  ? PARTIAL HYSTERECTOMY  1960  ? fibroid tumors, ovaries not removed  ? tonsillectomy    ? TONSILLECTOMY    ? ?Family History  ?Problem Relation Age of Onset  ? Stroke Mother   ? Stroke Father   ? Hypertension Father   ? Diabetes Brother   ? Multiple sclerosis Brother   ? Breast cancer Daughter 54  ? Colon cancer Neg Hx   ? ?Social History  ? ?Socioeconomic History  ? Marital status: Married  ?  Spouse name: Not on file  ? Number of children: Not on file  ? Years of education: Not on file  ? Highest education level: Not on file  ?Occupational History  ? Not on file  ?Tobacco Use  ? Smoking status: Never  ? Smokeless tobacco: Never  ?Vaping Use  ? Vaping Use: Never used  ?Substance and Sexual Activity  ? Alcohol use: No  ?  Alcohol/week: 0.0 standard drinks  ? Drug use: No  ? Sexual activity: Not Currently  ?  Birth control/protection: None, Post-menopausal  ?Other Topics Concern  ? Not on file  ?Social History Narrative  ?  Not on file  ? ?Social Determinants of Health  ? ?Financial Resource Strain: Low Risk   ? Difficulty of Paying Living Expenses: Not hard at all  ?Food Insecurity: No Food Insecurity  ? Worried About Charity fundraiser in the Last Year: Never true  ? Ran Out of Food in the Last Year: Never true  ?Transportation Needs: No Transportation Needs  ? Lack of Transportation (Medical): No  ? Lack of Transportation (Non-Medical): No  ?Physical Activity: Not on file  ?Stress: No Stress Concern Present  ? Feeling of Stress : Not at all  ?Social Connections: Socially Integrated  ? Frequency of Communication with Friends and Family: More than three times a week  ? Frequency of Social Gatherings with Friends and Family: More than three times a week  ? Attends Religious Services: More than 4 times per year  ? Active Member of Clubs or Organizations: Yes  ? Attends Archivist Meetings: Not on file  ?  Marital Status: Married  ? ? ? ?Review of Systems  ?Constitutional:  Negative for appetite change and unexpected weight change.  ?HENT:  Negative for congestion and sinus pressure.   ?Respiratory:  Negative for cough, chest tightness and shortness of breath.   ?Cardiovascular:  Negative for chest pain, palpitations and leg swelling.  ?Gastrointestinal:  Negative for abdominal pain, diarrhea, nausea and vomiting.  ?Genitourinary:  Negative for difficulty urinating and dysuria.  ?Musculoskeletal:  Negative for myalgias.  ?     Back and left knee pain as outlined.  Seeing ortho.   ?Skin:  Negative for color change and rash.  ?Neurological:  Negative for dizziness, light-headedness and headaches.  ?Psychiatric/Behavioral:  Negative for agitation and dysphoric mood.   ? ?   ?Objective:  ?  ? ?BP 140/80 (BP Location: Left Arm, Patient Position: Sitting, Cuff Size: Small)   Pulse 68   Temp 98.6 ?F (37 ?C) (Temporal)   Resp 18   Ht '5\' 3"'$  (1.6 m)   Wt 183 lb (83 kg)   SpO2 99%   BMI 32.42 kg/m?  ?Wt Readings from Last 3 Encounters:  ?04/21/21 183 lb (83 kg)  ?02/26/21 183 lb (83 kg)  ?02/09/21 183 lb (83 kg)  ? ? ?Physical Exam ?Vitals reviewed.  ?Constitutional:   ?   General: She is not in acute distress. ?   Appearance: Normal appearance.  ?HENT:  ?   Head: Normocephalic and atraumatic.  ?   Right Ear: External ear normal.  ?   Left Ear: External ear normal.  ?Eyes:  ?   General: No scleral icterus.    ?   Right eye: No discharge.     ?   Left eye: No discharge.  ?   Conjunctiva/sclera: Conjunctivae normal.  ?Neck:  ?   Thyroid: No thyromegaly.  ?Cardiovascular:  ?   Rate and Rhythm: Normal rate and regular rhythm.  ?Pulmonary:  ?   Effort: No respiratory distress.  ?   Breath sounds: Normal breath sounds. No wheezing.  ?Abdominal:  ?   General: Bowel sounds are normal.  ?   Palpations: Abdomen is soft.  ?   Tenderness: There is no abdominal tenderness.  ?Musculoskeletal:     ?   General: No swelling or tenderness.   ?   Cervical back: Neck supple. No tenderness.  ?Lymphadenopathy:  ?   Cervical: No cervical adenopathy.  ?Skin: ?   Findings: No erythema or rash.  ?Neurological:  ?   Mental  Status: She is alert.  ?Psychiatric:     ?   Mood and Affect: Mood normal.     ?   Behavior: Behavior normal.  ? ? ? ?Outpatient Encounter Medications as of 04/21/2021  ?Medication Sig  ? albuterol (VENTOLIN HFA) 108 (90 Base) MCG/ACT inhaler Inhale 2 puffs into the lungs every 6 (six) hours as needed for wheezing or shortness of breath.  ? amLODipine (NORVASC) 5 MG tablet Take 1 tablet (5 mg total) by mouth daily.  ? aspirin 81 MG tablet Take 81 mg by mouth daily.  ? BD PEN NEEDLE NANO U/F 32G X 4 MM MISC USE 1 EACH 4 (FOUR) TIMES DAILY  ? Calcium Citrate-Vitamin D (CALCIUM + D PO) Take 1 tablet by mouth daily.  ? celecoxib (CELEBREX) 200 MG capsule Take by mouth 2 (two) times daily.  ? Continuous Blood Gluc Receiver (FREESTYLE LIBRE 14 DAY READER) DEVI Use 1 each as needed .  Use to test blood sugar  ? Continuous Blood Gluc Sensor (FREESTYLE LIBRE 14 DAY SENSOR) MISC Use 1 each every 14 (fourteen) days  ? doxylamine, Sleep, (UNISOM) 25 MG tablet Take 1 tablet by mouth at bedtime as needed.  ? fexofenadine (ALLEGRA) 180 MG tablet Take 180 mg by mouth daily.  ? fluticasone (FLONASE) 50 MCG/ACT nasal spray SPRAY 2 SPRAYS INTO EACH NOSTRIL EVERY DAY  ? glucagon (GLUCAGON EMERGENCY) 1 MG injection Inject 1 mg into the muscle once as needed.  ? glucose blood test strip   ? insulin aspart (NOVOLOG FLEXPEN) 100 UNIT/ML FlexPen Inject 10 Units into the skin in the morning, at noon, and at bedtime.  ? ipratropium (ATROVENT) 0.06 % nasal spray PLACE 2 SPRAYS INTO BOTH NOSTRILS 4 (FOUR) TIMES DAILY AS NEEDED FOR RHINITIS.  ? LANTUS SOLOSTAR 100 UNIT/ML Solostar Pen Inject 28-30 Units into the skin at bedtime.  ? levothyroxine (SYNTHROID) 200 MCG tablet Take 1 tablet (200 mcg total) by mouth daily before breakfast.  ? LORazepam (ATIVAN) 0.5 MG tablet  Take 0.5-1 tablets (0.25-0.5 mg total) by mouth daily as needed for anxiety.  ? metFORMIN (GLUCOPHAGE-XR) 500 MG 24 hr tablet Take 1 tablet (500 mg total) by mouth 2 (two) times daily.  ? Multiple Vitamins-Minerals

## 2021-04-21 NOTE — Patient Instructions (Signed)
You are currently taking sertraline '25mg'$  one per day.  Decrease - start taking one every other day for one week and then every third day for one week and then stop.  ?

## 2021-04-25 ENCOUNTER — Encounter: Payer: Self-pay | Admitting: Internal Medicine

## 2021-04-25 NOTE — Assessment & Plan Note (Signed)
Recent elevated TSH.  Increased synthroid to 226mg q day.  Recheck tsh today.  ?

## 2021-04-25 NOTE — Assessment & Plan Note (Signed)
Does not feel zoloft has made a difference.  Reports not feeling as anxious. Wants to taper off.  Follow. Tapering instructions given.  ?

## 2021-04-25 NOTE — Assessment & Plan Note (Signed)
Continue crestor.  Low cholesterol diet and exercise.  Follow lipid panel and liver function tests.   ?Lab Results  ?Component Value Date  ? CHOL 118 03/04/2021  ? HDL 50.30 03/04/2021  ? LDLCALC 51 03/04/2021  ? TRIG 85.0 03/04/2021  ? CHOLHDL 2 03/04/2021  ? ?

## 2021-04-25 NOTE — Assessment & Plan Note (Signed)
Continue valsartan/hctz and amlodipine.  Continue current medications.  Follow pressures.  Follow metabolic panel.  

## 2021-04-25 NOTE — Assessment & Plan Note (Signed)
History of gastric lymphoma.  Symptoms as outlined.  Refer back to GI for evaluation and question of need for EGD.   ?

## 2021-04-25 NOTE — Assessment & Plan Note (Signed)
Has seen [facility].  Carotid duplex 22023  reveals velocities in the 1 to 39% range bilaterally.  No role for intervention at that level.  Continue annual surveillance and her current medical regimen. - continue crestor.  

## 2021-04-25 NOTE — Assessment & Plan Note (Signed)
S/p surgery.  Followed by ortho.  ?

## 2021-04-25 NOTE — Assessment & Plan Note (Signed)
Discussed recent GFR.  Discussed celebrex - prescribed by ortho.  Discussed limiting amount.  ?

## 2021-04-25 NOTE — Assessment & Plan Note (Signed)
On protonix.  No significant acid reflux, but she is having some issues with swallowing.  With history, will refer to GI for evaluation and question of need for EGD.  ?

## 2021-04-25 NOTE — Assessment & Plan Note (Signed)
Followed by endocrinology.  Discussed low carb diet and exercise.  Discussed recent met b and a1c.  Eye exam - no retinopathy 03/2020.  Seeing endocrinology.  Using SSI.  Adjusting.  ?

## 2021-04-27 DIAGNOSIS — J301 Allergic rhinitis due to pollen: Secondary | ICD-10-CM | POA: Diagnosis not present

## 2021-04-28 ENCOUNTER — Other Ambulatory Visit: Payer: Self-pay | Admitting: Internal Medicine

## 2021-05-04 DIAGNOSIS — J301 Allergic rhinitis due to pollen: Secondary | ICD-10-CM | POA: Diagnosis not present

## 2021-05-08 ENCOUNTER — Other Ambulatory Visit: Payer: Self-pay | Admitting: Internal Medicine

## 2021-05-10 DIAGNOSIS — J301 Allergic rhinitis due to pollen: Secondary | ICD-10-CM | POA: Diagnosis not present

## 2021-05-18 DIAGNOSIS — J301 Allergic rhinitis due to pollen: Secondary | ICD-10-CM | POA: Diagnosis not present

## 2021-05-25 DIAGNOSIS — J301 Allergic rhinitis due to pollen: Secondary | ICD-10-CM | POA: Diagnosis not present

## 2021-05-25 DIAGNOSIS — M25562 Pain in left knee: Secondary | ICD-10-CM | POA: Diagnosis not present

## 2021-05-25 DIAGNOSIS — M2392 Unspecified internal derangement of left knee: Secondary | ICD-10-CM | POA: Diagnosis not present

## 2021-06-01 DIAGNOSIS — J301 Allergic rhinitis due to pollen: Secondary | ICD-10-CM | POA: Diagnosis not present

## 2021-06-03 ENCOUNTER — Other Ambulatory Visit: Payer: Self-pay | Admitting: Physician Assistant

## 2021-06-03 DIAGNOSIS — M2392 Unspecified internal derangement of left knee: Secondary | ICD-10-CM

## 2021-06-04 DIAGNOSIS — R809 Proteinuria, unspecified: Secondary | ICD-10-CM | POA: Diagnosis not present

## 2021-06-04 DIAGNOSIS — E1029 Type 1 diabetes mellitus with other diabetic kidney complication: Secondary | ICD-10-CM | POA: Diagnosis not present

## 2021-06-04 DIAGNOSIS — I152 Hypertension secondary to endocrine disorders: Secondary | ICD-10-CM | POA: Diagnosis not present

## 2021-06-04 DIAGNOSIS — E039 Hypothyroidism, unspecified: Secondary | ICD-10-CM | POA: Diagnosis not present

## 2021-06-04 DIAGNOSIS — E1069 Type 1 diabetes mellitus with other specified complication: Secondary | ICD-10-CM | POA: Diagnosis not present

## 2021-06-04 DIAGNOSIS — E1159 Type 2 diabetes mellitus with other circulatory complications: Secondary | ICD-10-CM | POA: Diagnosis not present

## 2021-06-04 DIAGNOSIS — E785 Hyperlipidemia, unspecified: Secondary | ICD-10-CM | POA: Diagnosis not present

## 2021-06-08 ENCOUNTER — Encounter: Payer: Self-pay | Admitting: Internal Medicine

## 2021-06-08 ENCOUNTER — Ambulatory Visit (INDEPENDENT_AMBULATORY_CARE_PROVIDER_SITE_OTHER): Payer: PPO | Admitting: Internal Medicine

## 2021-06-08 DIAGNOSIS — N182 Chronic kidney disease, stage 2 (mild): Secondary | ICD-10-CM | POA: Diagnosis not present

## 2021-06-08 DIAGNOSIS — K5900 Constipation, unspecified: Secondary | ICD-10-CM

## 2021-06-08 DIAGNOSIS — I10 Essential (primary) hypertension: Secondary | ICD-10-CM

## 2021-06-08 DIAGNOSIS — F419 Anxiety disorder, unspecified: Secondary | ICD-10-CM

## 2021-06-08 DIAGNOSIS — D649 Anemia, unspecified: Secondary | ICD-10-CM

## 2021-06-08 DIAGNOSIS — E7800 Pure hypercholesterolemia, unspecified: Secondary | ICD-10-CM

## 2021-06-08 DIAGNOSIS — E1065 Type 1 diabetes mellitus with hyperglycemia: Secondary | ICD-10-CM

## 2021-06-08 DIAGNOSIS — E038 Other specified hypothyroidism: Secondary | ICD-10-CM

## 2021-06-08 DIAGNOSIS — K219 Gastro-esophageal reflux disease without esophagitis: Secondary | ICD-10-CM | POA: Diagnosis not present

## 2021-06-08 DIAGNOSIS — C8599 Non-Hodgkin lymphoma, unspecified, extranodal and solid organ sites: Secondary | ICD-10-CM | POA: Diagnosis not present

## 2021-06-08 DIAGNOSIS — I779 Disorder of arteries and arterioles, unspecified: Secondary | ICD-10-CM

## 2021-06-08 MED ORDER — LEVOTHYROXINE SODIUM 200 MCG PO TABS: 200.0000 ug | ORAL_TABLET | Freq: Every day | ORAL | 2 refills | Status: DC

## 2021-06-08 NOTE — Progress Notes (Signed)
Patient ID: Marissa Huff, female   DOB: [birth year], 88 y.o.   MRN: [id number]   Subjective:    Patient ID: Marissa Huff, female    DOB: Sep 26, 1931, 88 y.o.   MRN: [id number]   Patient here for a scheduled follow up.   Chief Complaint  Patient presents with   Follow-up   Hypothyroidism   Hypertension   Diabetes   .   HPI Recent a1c 8.0.  sees Dr Honor Junes.  Adjusted humalog - one extra unit with meals.  Discussed diet and exercise.  Limited with exercise - pain right buttock and knee.  Planning MRI knee - 06/16/21. Has celebrex.  Trying to limit dose.  May take 2x/week.  Does report increased stress.  Gets aggravated with herself.  Previously on zoloft.  Wants to retry.  No chest pain.  Breathing appears to be stable.  Miralax helps with bowels.  Has f/u with GI coming up.     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[place])    Gastroparesis    GERD (gastroesophageal reflux disease)    Hypertension    Hypothyroidism    PUD (peptic ulcer disease)    Skin cancer    Spinal stenosis    lumbar laminectomy   Past Surgical History:  Procedure Laterality Date   ABDOMINAL HYSTERECTOMY     antrectomy and vagotomy  1994   arthroscopic left knee  12/05/08   BACK SURGERY     carpal tunnel repai  10/10   left    CATARACT EXTRACTION, BILATERAL     EYE SURGERY     bilateral cataracts   gastric lymphoma  1996   surgery   LAPAROSCOPIC CHOLECYSTECTOMY  6/01   LUMBAR LAMINECTOMY  1999   LUMBAR LAMINECTOMY WITH COFLEX 1 LEVEL N/A 09/15/2015   Procedure: Lumbar one-two Laminectomy with placement of coflex;  Surgeon: Kristeen Miss, MD;  Location: [facility];  Service: Neurosurgery;  Laterality: N/A;   PARTIAL HYSTERECTOMY  1960   fibroid tumors, ovaries not removed   tonsillectomy     TONSILLECTOMY     Family History  Problem Relation Age of  Onset   Stroke Mother    Stroke Father    Hypertension Father    Diabetes Brother    Multiple sclerosis Brother    Breast cancer Daughter 43   Colon cancer Neg Hx    Social History   Socioeconomic History   Marital status: Married    Spouse name: Not on file   Number of children: Not on file   Years of education: Not on file   Highest education level: Not on file  Occupational History   Not on file  Tobacco Use   Smoking status: Never   Smokeless tobacco: Never  Vaping Use   Vaping Use: Never used  Substance and Sexual Activity   Alcohol use: No    Alcohol/week: 0.0 standard drinks of alcohol   Drug use: No   Sexual activity: Not Currently    Birth control/protection: None, Post-menopausal  Other Topics Concern   Not on file  Social History Narrative   Not on file   Social Determinants of Health   Financial Resource Strain: Low Risk  (01/28/2021)   Overall Financial Resource Strain (CARDIA)    Difficulty of Paying Living Expenses: Not hard  at all  Food Insecurity: No Food Insecurity (01/28/2021)   Hunger Vital Sign    Worried About Running Out of Food in the Last Year: Never true    Ran Out of Food in the Last Year: Never true  Transportation Needs: No Transportation Needs (01/28/2021)   PRAPARE - Hydrologist (Medical): No    Lack of Transportation (Non-Medical): No  Physical Activity: Unknown (01/19/2018)   Exercise Vital Sign    Days of Exercise per Week: 0 days    Minutes of Exercise per Session: Not on file  Stress: No Stress Concern Present (01/28/2021)   [place]    Feeling of Stress : Not at all  Social Connections: [place] (01/28/2021)   Social Connection and Isolation Panel [NHANES]    Frequency of Communication with Friends and Family: More than three times a week    Frequency of Social Gatherings with Friends and Family: More than three times a  week    Attends Religious Services: More than 4 times per year    Active Member of [employer] or Organizations: Yes    Attends Archivist Meetings: Not on file    Marital Status: Married     Review of Systems  Constitutional:  Negative for appetite change and unexpected weight change.  HENT:  Negative for congestion and sinus pressure.   Respiratory:  Negative for cough and chest tightness.        Breathing stable.   Cardiovascular:  Negative for chest pain and palpitations.       No increased swelling.   Gastrointestinal:  Negative for abdominal pain, diarrhea, nausea and vomiting.  Genitourinary:  Negative for difficulty urinating and dysuria.  Musculoskeletal:  Negative for myalgias.       Pain - right buttock and knee.   Skin:  Negative for color change and rash.  Neurological:  Negative for dizziness, light-headedness and headaches.  Psychiatric/Behavioral:  Negative for dysphoric mood.        Increased stress as outlined.  Aggravation.        Objective:     BP 112/76 (BP Location: Left Arm, Patient Position: Sitting, Cuff Size: Small)   Pulse 85   Temp 98.2 F (36.8 C) (Temporal)   Resp 19   Ht 5' 3"  (1.6 m)   Wt 180 lb 6.4 oz (81.8 kg)   SpO2 97%   BMI 31.96 kg/m  Wt Readings from Last 3 Encounters:  06/08/21 180 lb 6.4 oz (81.8 kg)  04/21/21 183 lb (83 kg)  02/26/21 183 lb (83 kg)    Physical Exam Vitals reviewed.  Constitutional:      General: She is not in acute distress.    Appearance: Normal appearance.  HENT:     Head: Normocephalic and atraumatic.     Right Ear: External ear normal.     Left Ear: External ear normal.  Eyes:     General: No scleral icterus.       Right eye: No discharge.        Left eye: No discharge.     Conjunctiva/sclera: Conjunctivae normal.  Neck:     Thyroid: No thyromegaly.  Cardiovascular:     Rate and Rhythm: Normal rate and regular rhythm.  Pulmonary:     Effort: No respiratory distress.     Breath sounds:  Normal breath sounds. No wheezing.  Abdominal:     General: Bowel sounds are normal.  Palpations: Abdomen is soft.     Tenderness: There is no abdominal tenderness.  Musculoskeletal:        General: No swelling or tenderness.     Cervical back: Neck supple. No tenderness.  Lymphadenopathy:     Cervical: No cervical adenopathy.  Skin:    Findings: No erythema or rash.  Neurological:     Mental Status: She is alert.  Psychiatric:        Mood and Affect: Mood normal.        Behavior: Behavior normal.      Outpatient Encounter Medications as of 06/08/2021  Medication Sig   albuterol (VENTOLIN HFA) 108 (90 Base) MCG/ACT inhaler Inhale 2 puffs into the lungs every 6 (six) hours as needed for wheezing or shortness of breath.   amLODipine (NORVASC) 5 MG tablet TAKE 1 TABLET (5 MG TOTAL) BY MOUTH DAILY.   aspirin 81 MG tablet Take 81 mg by mouth daily.   BD PEN NEEDLE NANO U/F 32G X 4 MM MISC USE 1 EACH 4 (FOUR) TIMES DAILY   Calcium Citrate-Vitamin D (CALCIUM + D PO) Take 1 tablet by mouth daily.   celecoxib (CELEBREX) 200 MG capsule Take by mouth 2 (two) times daily.   Continuous Blood Gluc Receiver (FREESTYLE LIBRE 14 DAY READER) DEVI Use 1 each as needed .  Use to test blood sugar   Continuous Blood Gluc Sensor (FREESTYLE LIBRE 14 DAY SENSOR) MISC Use 1 each every 14 (fourteen) days   doxylamine, Sleep, (UNISOM) 25 MG tablet Take 1 tablet by mouth at bedtime as needed.   fexofenadine (ALLEGRA) 180 MG tablet Take 180 mg by mouth daily.   fluticasone (FLONASE) 50 MCG/ACT nasal spray SPRAY 2 SPRAYS INTO EACH NOSTRIL EVERY DAY   glucagon (GLUCAGON EMERGENCY) 1 MG injection Inject 1 mg into the muscle once as needed.   glucose blood test strip    insulin aspart (NOVOLOG FLEXPEN) 100 UNIT/ML FlexPen Inject 10 Units into the skin in the morning, at noon, and at bedtime.   ipratropium (ATROVENT) 0.06 % nasal spray PLACE 2 SPRAYS INTO BOTH NOSTRILS 4 (FOUR) TIMES DAILY AS NEEDED FOR RHINITIS.    LANTUS SOLOSTAR 100 UNIT/ML Solostar Pen Inject 28-30 Units into the skin at bedtime.   LORazepam (ATIVAN) 0.5 MG tablet Take 0.5-1 tablets (0.25-0.5 mg total) by mouth daily as needed for anxiety.   metFORMIN (GLUCOPHAGE-XR) 500 MG 24 hr tablet TAKE 1 TABLET BY MOUTH TWICE A DAY   Multiple Vitamins-Minerals (MULTIVITAMIN PO) Take 1 tablet by mouth daily.    Multiple Vitamins-Minerals (PRESERVISION AREDS PO) Take 1 capsule by mouth 2 (two) times daily.   pantoprazole (PROTONIX) 40 MG tablet TAKE 1 TABLET BY MOUTH TWICE A DAY   rosuvastatin (CRESTOR) 5 MG tablet TAKE 1 TABLET BY MOUTH THREE TIMES A WEEK   sertraline (ZOLOFT) 25 MG tablet TAKE 1 TABLET (25 MG TOTAL) BY MOUTH DAILY.   valsartan-hydrochlorothiazide (DIOVAN-HCT) 160-12.5 MG tablet TAKE 1 TABLET BY MOUTH EVERY DAY   [DISCONTINUED] levothyroxine (SYNTHROID) 200 MCG tablet TAKE 1 TABLET (200 MCG TOTAL) BY MOUTH DAILY BEFORE BREAKFAST.   [DISCONTINUED] levothyroxine (SYNTHROID) 200 MCG tablet Take by mouth.   levothyroxine (SYNTHROID) 200 MCG tablet Take 1 tablet (200 mcg total) by mouth daily before breakfast.   No facility-administered encounter medications on file as of 06/08/2021.     Lab Results  Component Value Date   WBC 4.9 03/04/2021   HGB 12.0 03/04/2021   HCT 35.8 (L) 03/04/2021  PLT 203.0 03/04/2021   GLUCOSE 201 (H) 04/21/2021   CHOL 118 03/04/2021   TRIG 85.0 03/04/2021   HDL 50.30 03/04/2021   LDLCALC 51 03/04/2021   ALT 18 03/04/2021   AST 17 03/04/2021   NA 137 04/21/2021   K 3.9 04/21/2021   CL 102 04/21/2021   CREATININE 0.92 04/21/2021   BUN 26 (H) 04/21/2021   CO2 26 04/21/2021   TSH 0.80 04/21/2021   HGBA1C 8.1 (H) 03/04/2021   MICROALBUR 1.4 06/02/2020    DG Bone Density  Result Date: 03/31/2021 EXAM: DUAL X-RAY ABSORPTIOMETRY (DXA) FOR BONE MINERAL DENSITY IMPRESSION: Dear Dr Nicki Reaper, Your patient ZAMZAM WHINERY completed a FRAX assessment on 03/31/2021 using the [place]  (analysis version: 14.10) manufactured by [employer]. The following summarizes the results of our evaluation. PATIENT BIOGRAPHICAL: Name: Tenae, Graziosi Patient ID: [id number] Birth Date: [birth year] Height:    63.0 in. Gender:     [age]    [age]       Weight:    183.0 lbs. Ethnicity:  White                            Exam Date: 03/31/2021 FRAX* RESULTS:  (version: 3.5) 10-year Probability of Fracture1 Major Osteoporotic Fracture2 Hip Fracture 14.9% 3.5% Population: [place] (Caucasian) Risk Factors: History of Fracture (Adult) Based on Femur (Right) Neck BMD 1 -The 10-year probability of fracture may be lower than reported if the patient has received treatment. 2 -Major Osteoporotic Fracture: Clinical Spine, Forearm, Hip or Shoulder *FRAX is a Materials engineer of the [employer] for Metabolic Bone Disease, a [place] (WHO) [facility]. ASSESSMENT: The probability of a major osteoporotic fracture is 14.9% within the next ten years. The probability of a hip fracture is 3.5% within the next ten years. . Your patient Miria Cappelli completed a BMD test on 03/31/2021 using the [place] (software version: 14.10) manufactured by [employer]. The following summarizes the results of our evaluation. Technologist: PATIENT BIOGRAPHICAL: Name: Shelsie, Tijerino Patient ID: [id number] Birth Date: 1931-12-07 Height: 63.0 in. Gender: Female Exam Date: 03/31/2021 Weight: 183.0 lbs. Indications: DIABETES, Hypothyroid, History of Fracture (Adult), Hysterectomy, History of Spinal Surgery, Caucasian, Height Loss, Postmenopausal, Advanced Age Fractures: Left hand Treatments: Synthroid, allegra, Multi-Vitamin with calcium, CALCIUM VIT D, Metformin, insulin DENSITOMETRY RESULTS: Site         Region     Measured Date Measured Age WHO Classification Young Adult T-score BMD         %Change vs. Previous Significant Change (*) DualFemur Neck Right  03/31/20[age] Osteopenia -1.2 0.871 g/cm2 -9.0% Yes DualFemur Neck Right 01/26/2015 81.8 Normal -0.6 0.957 g/cm2 - - DualFemur Total Mean 03/31/2021 88.0 Normal -0.7 0.915 g/cm2 -11.7% Yes DualFemur Total Mean 01/26/2015 81.8 - - 1.036 g/cm2 - - Left Forearm Radius 33% 03/31/2021 88.0 Osteopenia -2.1 0.688 g/cm2 -0.7% - Left Forearm Radius 33% 01/26/2015 81.8 Osteopenia -2.1 0.693 g/cm2 - - ASSESSMENT: The BMD measured at Forearm Radius 33% is 0.688 g/cm2 with a T-score of -2.1. This patient is considered osteopenic according to [facility]) criteria. The scan quality is good. Lumbar spine was not utilized due to advanced degenerative changes and surgical hardware.Compared with prior study, there has been significant decrease in the total hip. World Health Organization [facility] criteria for post-menopausal, Caucasian Women: Normal:  T-score at or above -1 SD Osteopenia/low bone mass: T-score between -1 and -2.5 SD Osteoporosis:             T-score at or below -2.5 SD RECOMMENDATIONS: 1. All patients should optimize calcium and vitamin D intake. 2. Consider FDA-approved medical therapies in postmenopausal women and men aged 43 years and older, based on the following: a. A hip or vertebral(clinical or morphometric) fracture b. T-score < -2.5 at the femoral neck or spine after appropriate evaluation to exclude secondary causes c. Low bone mass (T-score between -1.0 and -2.5 at the femoral neck or spine) and a 10-year probability of a hip fracture > 3% or a 10-year probability of a major osteoporosis-related fracture > 20% based on the [place]-adapted WHO algorithm 3. Clinician judgment and/or patient preferences may indicate treatment for people with 10-year fracture probabilities above or below these levels FOLLOW-UP: People with diagnosed cases of osteoporosis or at high risk for fracture should have regular bone mineral density tests. For patients eligible for Medicare, routine testing  is allowed once every 2 years. The testing frequency can be increased to one year for patients who have rapidly progressing disease, those who are receiving or discontinuing medical therapy to restore bone mass, or have additional risk factors. I have reviewed this report, and agree with the above findings. [facility] Electronically Signed   By: Elmer Picker M.D.   On: 03/31/2021 14:31       Assessment & Plan:   Problem List Items Addressed This Visit     Anemia    Recent cbc wnl.       Anxiety    Increased stress.  Wants to retry zoloft.  Start 71m q day.  Follow.       Carotid artery disease, [place]ality [place]    Has seen [facility].  Carotid duplex 22023  reveals velocities in the 1 to 39% range bilaterally.  No role for intervention at that level.  Continue annual surveillance and her current medical regimen. - continue crestor.       CKD (chronic kidney disease) stage 2, GFR 60-89 ml/min    Discussed recent GFR.  Discussed celebrex - prescribed by ortho.  Discussed limiting amount. Only taking 2x/week.  Trying to limit dose.        Constipation    miralax helping.       [place]    History of gastric lymphoma.  Symptoms outlined in last note have improved.  miralax has helped bowels.  Was previously referred back to GI for evaluation and question of need for EGD given history.  She will keep appt just to confirm if any further evaluation/w/up warranted.       GERD (gastroesophageal reflux disease)    On protonix.  Was having issues with swallowing as outlined previously.  Discussed eating slowly, small bites and chewing food well.  Upcoming appt with GI.       Hypercholesterolemia    Continue crestor.  Low cholesterol diet and exercise.  Follow lipid panel and liver function tests.   Lab Results  Component Value Date   CHOL 118 03/04/2021   HDL 50.30 03/04/2021   LDLCALC 51 03/04/2021   TRIG 85.0 03/04/2021   CHOLHDL 2  03/04/2021       Hypertension    Continue valsartan/hctz and amlodipine.  Continue current medications.  Follow pressures.  Follow metabolic panel.       Hypothyroidism    Recent elevated TSH.  Increased synthroid to 226mg q day.  Follow tsh.       Relevant Medications   levothyroxine (SYNTHROID) 200 MCG tablet   Type 1 diabetes mellitus with hyperglycemia ([place]    Followed by endocrinology.  Discussed low carb diet and exercise.  Discussed recent met b and a1c.  Eye exam - no retinopathy 03/2020.  Seeing endocrinology.  Using SSI.  Adjusting.         CEinar Pheasant MD

## 2021-06-13 ENCOUNTER — Encounter: Payer: Self-pay | Admitting: Internal Medicine

## 2021-06-13 NOTE — Assessment & Plan Note (Signed)
Continue crestor.  Low cholesterol diet and exercise.  Follow lipid panel and liver function tests.   Lab Results  Component Value Date   CHOL 118 03/04/2021   HDL 50.30 03/04/2021   LDLCALC 51 03/04/2021   TRIG 85.0 03/04/2021   CHOLHDL 2 03/04/2021

## 2021-06-13 NOTE — Assessment & Plan Note (Signed)
Continue valsartan/hctz and amlodipine.  Continue current medications.  Follow pressures.  Follow metabolic panel.  

## 2021-06-13 NOTE — Assessment & Plan Note (Signed)
Has seen [facility].  Carotid duplex 22023  reveals velocities in the 1 to 39% range bilaterally.  No role for intervention at that level.  Continue annual surveillance and her current medical regimen. - continue crestor.  

## 2021-06-13 NOTE — Assessment & Plan Note (Signed)
History of gastric lymphoma.  Symptoms outlined in last note have improved.  miralax has helped bowels.  Was previously referred back to GI for evaluation and question of need for EGD given history.  She will keep appt just to confirm if any further evaluation/w/up warranted.

## 2021-06-13 NOTE — Assessment & Plan Note (Signed)
On protonix.  Was having issues with swallowing as outlined previously.  Discussed eating slowly, small bites and chewing food well.  Upcoming appt with GI.

## 2021-06-13 NOTE — Assessment & Plan Note (Signed)
miralax helping.

## 2021-06-13 NOTE — Assessment & Plan Note (Signed)
Followed by endocrinology.  Discussed low carb diet and exercise.  Discussed recent met b and a1c.  Eye exam - no retinopathy 03/2020.  Seeing endocrinology.  Using SSI.  Adjusting.  ?

## 2021-06-13 NOTE — Assessment & Plan Note (Signed)
Recent cbc wnl.

## 2021-06-13 NOTE — Assessment & Plan Note (Signed)
Increased stress.  Wants to retry zoloft.  Start '25mg'$  q day.  Follow.

## 2021-06-13 NOTE — Assessment & Plan Note (Signed)
Discussed recent GFR.  Discussed celebrex - prescribed by ortho.  Discussed limiting amount. Only taking 2x/week.  Trying to limit dose.

## 2021-06-13 NOTE — Assessment & Plan Note (Signed)
Recent elevated TSH.  Increased synthroid to 258mg q day.  Follow tsh.

## 2021-06-15 DIAGNOSIS — J301 Allergic rhinitis due to pollen: Secondary | ICD-10-CM | POA: Diagnosis not present

## 2021-06-16 ENCOUNTER — Ambulatory Visit
Admission: RE | Admit: 2021-06-16 | Discharge: 2021-06-16 | Disposition: A | Discharge status: Home / Self Care | Payer: PPO | Source: Ambulatory Visit | Attending: Physician Assistant | Admitting: Physician Assistant

## 2021-06-16 DIAGNOSIS — M2392 Unspecified internal derangement of left knee: Secondary | ICD-10-CM | POA: Insufficient documentation

## 2021-06-16 DIAGNOSIS — S83242A Other tear of medial meniscus, current injury, left knee, initial encounter: Secondary | ICD-10-CM | POA: Diagnosis not present

## 2021-06-29 DIAGNOSIS — J301 Allergic rhinitis due to pollen: Secondary | ICD-10-CM | POA: Diagnosis not present

## 2021-07-08 DIAGNOSIS — R142 Eructation: Secondary | ICD-10-CM | POA: Diagnosis not present

## 2021-07-08 DIAGNOSIS — K219 Gastro-esophageal reflux disease without esophagitis: Secondary | ICD-10-CM | POA: Diagnosis not present

## 2021-07-08 DIAGNOSIS — R1013 Epigastric pain: Secondary | ICD-10-CM | POA: Diagnosis not present

## 2021-07-08 DIAGNOSIS — Z8572 Personal history of non-Hodgkin lymphomas: Secondary | ICD-10-CM | POA: Diagnosis not present

## 2021-07-12 ENCOUNTER — Other Ambulatory Visit: Payer: Self-pay | Admitting: Internal Medicine

## 2021-07-12 DIAGNOSIS — M5441 Lumbago with sciatica, right side: Secondary | ICD-10-CM | POA: Diagnosis not present

## 2021-07-12 DIAGNOSIS — G8929 Other chronic pain: Secondary | ICD-10-CM | POA: Diagnosis not present

## 2021-07-13 ENCOUNTER — Other Ambulatory Visit: Payer: Self-pay | Admitting: Nurse Practitioner

## 2021-07-13 DIAGNOSIS — J301 Allergic rhinitis due to pollen: Secondary | ICD-10-CM | POA: Diagnosis not present

## 2021-07-13 DIAGNOSIS — R1013 Epigastric pain: Secondary | ICD-10-CM

## 2021-07-13 DIAGNOSIS — R142 Eructation: Secondary | ICD-10-CM

## 2021-07-13 DIAGNOSIS — K219 Gastro-esophageal reflux disease without esophagitis: Secondary | ICD-10-CM

## 2021-07-13 DIAGNOSIS — M2392 Unspecified internal derangement of left knee: Secondary | ICD-10-CM | POA: Diagnosis not present

## 2021-07-13 DIAGNOSIS — Z8572 Personal history of non-Hodgkin lymphomas: Secondary | ICD-10-CM

## 2021-07-14 ENCOUNTER — Other Ambulatory Visit: Payer: Self-pay | Admitting: Internal Medicine

## 2021-07-14 DIAGNOSIS — Z1231 Encounter for screening mammogram for malignant neoplasm of breast: Secondary | ICD-10-CM

## 2021-07-16 ENCOUNTER — Other Ambulatory Visit (INDEPENDENT_AMBULATORY_CARE_PROVIDER_SITE_OTHER): Payer: PPO

## 2021-07-16 DIAGNOSIS — E1065 Type 1 diabetes mellitus with hyperglycemia: Secondary | ICD-10-CM | POA: Diagnosis not present

## 2021-07-16 DIAGNOSIS — E7800 Pure hypercholesterolemia, unspecified: Secondary | ICD-10-CM

## 2021-07-16 DIAGNOSIS — E038 Other specified hypothyroidism: Secondary | ICD-10-CM | POA: Diagnosis not present

## 2021-07-16 LAB — BASIC METABOLIC PANEL
BUN: 19 mg/dL (ref 6–23)
CO2: 26 mEq/L (ref 19–32)
Calcium: 9.4 mg/dL (ref 8.4–10.5)
Chloride: 105 mEq/L (ref 96–112)
Creatinine, Ser: 0.73 mg/dL (ref 0.40–1.20)
GFR: 73.46 mL/min (ref >60.00)
Glucose, Bld: 175 mg/dL — ABNORMAL HIGH (ref 70–99)
Potassium: 3.8 mEq/L (ref 3.5–5.1)
Sodium: 141 mEq/L (ref 135–145)

## 2021-07-16 LAB — LIPID PANEL
Cholesterol: 106 mg/dL (ref 0–200)
HDL: 49.6 mg/dL (ref >39.00)
LDL Cholesterol: 46 mg/dL (ref 0–99)
NonHDL: 56.07
Total CHOL/HDL Ratio: 2
Triglycerides: 48 mg/dL (ref 0.0–149.0)
VLDL: 9.6 mg/dL (ref 0.0–40.0)

## 2021-07-16 LAB — HEPATIC FUNCTION PANEL
ALT: 15 U/L (ref 0–35)
AST: 14 U/L (ref 0–37)
Albumin: 4.1 g/dL (ref 3.5–5.2)
Alkaline Phosphatase: 92 U/L (ref 39–117)
Bilirubin, Direct: 0.1 mg/dL (ref 0.0–0.3)
Total Bilirubin: 0.4 mg/dL (ref 0.2–1.2)
Total Protein: 6.3 g/dL (ref 6.0–8.3)

## 2021-07-16 LAB — MICROALBUMIN / CREATININE URINE RATIO
Creatinine,U: 96.4 mg/dL
Microalb Creat Ratio: 2.4 mg/g (ref 0.0–30.0)
Microalb, Ur: 2.3 mg/dL — ABNORMAL HIGH (ref 0.0–1.9)

## 2021-07-16 LAB — HEMOGLOBIN A1C: Hgb A1c MFr Bld: 7.8 % — ABNORMAL HIGH (ref 4.6–6.5)

## 2021-07-16 LAB — TSH: TSH: 3.51 u[IU]/mL (ref 0.35–5.50)

## 2021-07-19 ENCOUNTER — Telehealth: Payer: Self-pay

## 2021-07-19 NOTE — Telephone Encounter (Signed)
Error

## 2021-07-20 DIAGNOSIS — M2392 Unspecified internal derangement of left knee: Secondary | ICD-10-CM | POA: Diagnosis not present

## 2021-07-21 ENCOUNTER — Ambulatory Visit (INDEPENDENT_AMBULATORY_CARE_PROVIDER_SITE_OTHER): Payer: PPO | Admitting: Internal Medicine

## 2021-07-21 ENCOUNTER — Encounter: Payer: Self-pay | Admitting: Internal Medicine

## 2021-07-21 VITALS — BP 126/64 | HR 71 | Temp 97.8°F | Ht 63.0 in | Wt 183.6 lb

## 2021-07-21 DIAGNOSIS — E1069 Type 1 diabetes mellitus with other specified complication: Secondary | ICD-10-CM

## 2021-07-21 DIAGNOSIS — E7800 Pure hypercholesterolemia, unspecified: Secondary | ICD-10-CM

## 2021-07-21 DIAGNOSIS — C8599 Non-Hodgkin lymphoma, unspecified, extranodal and solid organ sites: Secondary | ICD-10-CM

## 2021-07-21 DIAGNOSIS — I10 Essential (primary) hypertension: Secondary | ICD-10-CM | POA: Diagnosis not present

## 2021-07-21 DIAGNOSIS — F419 Anxiety disorder, unspecified: Secondary | ICD-10-CM

## 2021-07-21 DIAGNOSIS — E1065 Type 1 diabetes mellitus with hyperglycemia: Secondary | ICD-10-CM | POA: Diagnosis not present

## 2021-07-21 DIAGNOSIS — E785 Hyperlipidemia, unspecified: Secondary | ICD-10-CM

## 2021-07-21 DIAGNOSIS — I779 Disorder of arteries and arterioles, unspecified: Secondary | ICD-10-CM | POA: Diagnosis not present

## 2021-07-21 DIAGNOSIS — N182 Chronic kidney disease, stage 2 (mild): Secondary | ICD-10-CM | POA: Diagnosis not present

## 2021-07-21 DIAGNOSIS — K219 Gastro-esophageal reflux disease without esophagitis: Secondary | ICD-10-CM

## 2021-07-21 DIAGNOSIS — E038 Other specified hypothyroidism: Secondary | ICD-10-CM

## 2021-07-21 NOTE — Progress Notes (Addendum)
Patient ID: Marissa Huff, female   DOB: 11/02/1931, 88 y.o.   MRN: [id number]   Subjective:    Patient ID: Marissa Huff, female    DOB: 1931/04/08, 88 y.o.   MRN: [id number]   Patient here for a scheduled follow up.   Chief Complaint  Patient presents with   Follow-up    3 month follow up    .   HPI Here to follow up regarding her diabetes, hypertension and hypercholesterolemia.  Seeing ortho - MRI - degenerative tear to the meniscus of the left knee.  Has appt to see Dr Roland Rack 08/11/21. Increased pain - right low back/right lower extremity.  Was seen 07/12/21 - states discussed cortisone injection - hips.  Concerned regarding her back.  Discussed f/u with ortho.  No chest pain.  Breathing stable.  No acid reflux reported.  Some bowel issues.  Out of miralax.  If takes - keeps bowel regular.     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[place])    Gastroparesis    GERD (gastroesophageal reflux disease)    Hypertension    Hypothyroidism    PUD (peptic ulcer disease)    Skin cancer    Spinal stenosis    lumbar laminectomy   Past Surgical History:  Procedure Laterality Date   ABDOMINAL HYSTERECTOMY     antrectomy and vagotomy  1994   arthroscopic left knee  12/05/08   BACK SURGERY     carpal tunnel repai  10/10   left    CATARACT EXTRACTION, BILATERAL     EYE SURGERY     bilateral cataracts   gastric lymphoma  1996   surgery   LAPAROSCOPIC CHOLECYSTECTOMY  6/01   LUMBAR LAMINECTOMY  1999   LUMBAR LAMINECTOMY WITH COFLEX 1 LEVEL N/A 09/15/2015   Procedure: Lumbar one-two Laminectomy with placement of coflex;  Surgeon: Kristeen Miss, MD;  Location: [facility];  Service: Neurosurgery;  Laterality: N/A;   PARTIAL HYSTERECTOMY  1960   fibroid tumors, ovaries not removed   tonsillectomy     TONSILLECTOMY     Family History  Problem  Relation Age of Onset   Stroke Mother    Stroke Father    Hypertension Father    Diabetes Brother    Multiple sclerosis Brother    Breast cancer Daughter 31   Colon cancer Neg Hx    Social History   Socioeconomic History   Marital status: Married    Spouse name: Not on file   Number of children: Not on file   Years of education: Not on file   Highest education level: Not on file  Occupational History   Not on file  Tobacco Use   Smoking status: Never   Smokeless tobacco: Never  Vaping Use   Vaping Use: Never used  Substance and Sexual Activity   Alcohol use: No    Alcohol/week: 0.0 standard drinks of alcohol   Drug use: No   Sexual activity: Not Currently    Birth control/protection: None, Post-menopausal  Other Topics Concern   Not on file  Social History Narrative   Not on file   Social Determinants of Health   Financial Resource Strain: Low Risk  (01/28/2021)   Overall Financial Resource Strain (CARDIA)    Difficulty of Paying Living Expenses: Not hard  at all  Food Insecurity: No Food Insecurity (01/28/2021)   Hunger Vital Sign    Worried About Running Out of Food in the Last Year: Never true    Ran Out of Food in the Last Year: Never true  Transportation Needs: No Transportation Needs (01/28/2021)   PRAPARE - Hydrologist (Medical): No    Lack of Transportation (Non-Medical): No  Physical Activity: Unknown (01/19/2018)   Exercise Vital Sign    Days of Exercise per Week: 0 days    Minutes of Exercise per Session: Not on file  Stress: No Stress Concern Present (01/28/2021)   [place]    Feeling of Stress : Not at all  Social Connections: [place] (01/28/2021)   Social Connection and Isolation Panel [NHANES]    Frequency of Communication with Friends and Family: More than three times a week    Frequency of Social Gatherings with Friends and Family: More than  three times a week    Attends Religious Services: More than 4 times per year    Active Member of [employer] or Organizations: Yes    Attends Archivist Meetings: Not on file    Marital Status: Married     Review of Systems  Constitutional:  Negative for appetite change and unexpected weight change.  HENT:  Negative for congestion and sinus pressure.   Respiratory:  Negative for chest tightness and shortness of breath.   Cardiovascular:  Negative for chest pain, palpitations and leg swelling.  Gastrointestinal:  Negative for diarrhea, nausea and vomiting.       Miralax - to keep regular.   Genitourinary:  Negative for difficulty urinating and dysuria.  Musculoskeletal:  Positive for back pain. Negative for myalgias.       Leg pain.   Skin:  Negative for color change and rash.  Neurological:  Negative for dizziness, light-headedness and headaches.  Psychiatric/Behavioral:  Negative for agitation and dysphoric mood.        Objective:     BP 126/64   Pulse 71   Temp 97.8 F (36.6 C) (Oral)   Ht _0  (1.6 m)   Wt 183 lb 9.6 oz (83.3 kg)   SpO2 97%   BMI 32.52 kg/m  Wt Readings from Last 3 Encounters:  07/21/21 183 lb 9.6 oz (83.3 kg)  06/08/21 180 lb 6.4 oz (81.8 kg)  04/21/21 183 lb (83 kg)    Physical Exam Vitals reviewed.  Constitutional:      General: She is not in acute distress.    Appearance: Normal appearance.  HENT:     Head: Normocephalic and atraumatic.     Right Ear: External ear normal.     Left Ear: External ear normal.  Eyes:     General: No scleral icterus.       Right eye: No discharge.        Left eye: No discharge.     Conjunctiva/sclera: Conjunctivae normal.  Neck:     Thyroid: No thyromegaly.  Cardiovascular:     Rate and Rhythm: Normal rate and regular rhythm.  Pulmonary:     Effort: No respiratory distress.     Breath sounds: Normal breath sounds. No wheezing.  Abdominal:     General: Bowel sounds are normal.     Palpations:  Abdomen is soft.     Tenderness: There is no abdominal tenderness.  Musculoskeletal:        General:  No swelling or tenderness.     Cervical back: Neck supple. No tenderness.  Lymphadenopathy:     Cervical: No cervical adenopathy.  Skin:    Findings: No erythema or rash.  Neurological:     Mental Status: She is alert.  Psychiatric:        Mood and Affect: Mood normal.        Behavior: Behavior normal.      Outpatient Encounter Medications as of 07/21/2021  Medication Sig   albuterol (VENTOLIN HFA) 108 (90 Base) MCG/ACT inhaler Inhale 2 puffs into the lungs every 6 (six) hours as needed for wheezing or shortness of breath.   amLODipine (NORVASC) 5 MG tablet TAKE 1 TABLET (5 MG TOTAL) BY MOUTH DAILY.   aspirin 81 MG tablet Take 81 mg by mouth daily.   BD PEN NEEDLE NANO U/F 32G X 4 MM MISC USE 1 EACH 4 (FOUR) TIMES DAILY   Calcium Citrate-Vitamin D (CALCIUM + D PO) Take 1 tablet by mouth daily.   celecoxib (CELEBREX) 200 MG capsule Take by mouth 2 (two) times daily.   Continuous Blood Gluc Receiver (FREESTYLE LIBRE 14 DAY READER) DEVI Use 1 each as needed .  Use to test blood sugar   Continuous Blood Gluc Sensor (FREESTYLE LIBRE 14 DAY SENSOR) MISC Use 1 each every 14 (fourteen) days   doxylamine, Sleep, (UNISOM) 25 MG tablet Take 1 tablet by mouth at bedtime as needed.   fexofenadine (ALLEGRA) 180 MG tablet Take 180 mg by mouth daily.   fluticasone (FLONASE) 50 MCG/ACT nasal spray SPRAY 2 SPRAYS INTO EACH NOSTRIL EVERY DAY   glucagon (GLUCAGON EMERGENCY) 1 MG injection Inject 1 mg into the muscle once as needed.   glucose blood test strip    insulin aspart (NOVOLOG FLEXPEN) 100 UNIT/ML FlexPen Inject 10 Units into the skin in the morning, at noon, and at bedtime.   ipratropium (ATROVENT) 0.06 % nasal spray PLACE 2 SPRAYS INTO BOTH NOSTRILS 4 (FOUR) TIMES DAILY AS NEEDED FOR RHINITIS.   LANTUS SOLOSTAR 100 UNIT/ML Solostar Pen Inject 28-30 Units into the skin at bedtime.    levothyroxine (SYNTHROID) 200 MCG tablet Take 1 tablet (200 mcg total) by mouth daily before breakfast.   LORazepam (ATIVAN) 0.5 MG tablet Take 0.5-1 tablets (0.25-0.5 mg total) by mouth daily as needed for anxiety.   metFORMIN (GLUCOPHAGE-XR) 500 MG 24 hr tablet TAKE 1 TABLET BY MOUTH TWICE A DAY   Multiple Vitamins-Minerals (MULTIVITAMIN PO) Take 1 tablet by mouth daily.    Multiple Vitamins-Minerals (PRESERVISION AREDS PO) Take 1 capsule by mouth 2 (two) times daily.   pantoprazole (PROTONIX) 40 MG tablet TAKE 1 TABLET BY MOUTH TWICE A DAY   rosuvastatin (CRESTOR) 5 MG tablet TAKE 1 TABLET BY MOUTH THREE TIMES A WEEK.   sertraline (ZOLOFT) 25 MG tablet TAKE 1 TABLET (25 MG TOTAL) BY MOUTH DAILY.   valsartan-hydrochlorothiazide (DIOVAN-HCT) 160-12.5 MG tablet TAKE 1 TABLET BY MOUTH EVERY DAY   No facility-administered encounter medications on file as of 07/21/2021.     Lab Results  Component Value Date   WBC 4.9 03/04/2021   HGB 12.0 03/04/2021   HCT 35.8 (L) 03/04/2021   PLT 203.0 03/04/2021   GLUCOSE 175 (H) 07/16/2021   CHOL 106 07/16/2021   TRIG 48.0 07/16/2021   HDL 49.60 07/16/2021   LDLCALC 46 07/16/2021   ALT 15 07/16/2021   AST 14 07/16/2021   NA 141 07/16/2021   K 3.8 07/16/2021   CL 105 07/16/2021  CREATININE 0.73 07/16/2021   BUN 19 07/16/2021   CO2 26 07/16/2021   TSH 3.51 07/16/2021   HGBA1C 7.8 (H) 07/16/2021   MICROALBUR 2.3 (H) 07/16/2021    MR KNEE LEFT WO CONTRAST  Result Date: 06/18/2021 CLINICAL DATA:  History of meniscal repair many years ago. Medial left knee pain. EXAM: MRI OF THE LEFT KNEE WITHOUT CONTRAST TECHNIQUE: Multiplanar, multisequence MR imaging of the knee was performed. No intravenous contrast was administered. COMPARISON:  10/30/2020 FINDINGS: MENISCI Medial: Radial tear of the posterior horn of the medial meniscus towards the meniscal root versus prior meniscectomy. Peripheral meniscal extrusion. Degeneration of the posterior horn of the  medial meniscus. No significant interval change compared with 10/30/2020. Lateral: Small radial tear of the free edge of the body of the medial meniscus. LIGAMENTS Cruciates: Intact ACL and PCL. ACL is expanded and increased in signal as can be seen with mucinous degeneration. Collaterals: Medial collateral ligament is intact. Lateral collateral ligament complex is intact. CARTILAGE Patellofemoral: Partial-thickness cartilage loss of the inferior patellar apex with subchondral reactive marrow edema. Focal area of high-grade partial-thickness cartilage loss of the trochlear groove with subchondral reactive marrow changes. Medial: Mild partial-thickness cartilage loss of the medial femorotibial compartment. Lateral: Mild partial-thickness cartilage loss of the lateral femorotibial compartment. JOINT: Small joint effusion. Normal Hoffa's fat-pad. No plical thickening. POPLITEAL FOSSA: Popliteus tendon is intact. Tiny Baker's cyst. EXTENSOR MECHANISM: Intact quadriceps tendon. Intact patellar tendon. Intact lateral patellar retinaculum. Intact medial patellar retinaculum. Intact MPFL. BONES: No aggressive osseous lesion. No fracture or dislocation. Other: No fluid collection or hematoma. Muscles are normal. Small amount of fluid in the pes anserine bursa. Small ganglion cyst at the origin of the medial gastrocnemius tendon. IMPRESSION: 1. Radial tear of the posterior horn of the medial meniscus towards the meniscal root versus prior meniscectomy. Peripheral meniscal extrusion. No significant interval change compared with 10/30/2020. 2. Small radial tear of the free edge of the body of the medial meniscus. 3. Tricompartmental cartilage abnormalities as described above. Electronically Signed   By: Kathreen Devoid M.D.   On: 06/18/2021 07:19       Assessment & Plan:   Problem List Items Addressed This Visit     Anxiety    On zoloft.  Stable.  Follow.       Carotid artery disease, unspecified laterality ([place])     Has seen [facility].  Carotid duplex 22023  reveals velocities in the 1 to 39% range bilaterally.  No role for intervention at that level.  Continue annual surveillance and her current medical regimen. - continue crestor.       CKD (chronic kidney disease) stage 2, GFR 60-89 ml/min    Try to limit antiinflammatories.  Stay hydrated.  Follow metabolic panel.       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  Symptoms outlined in last note have improved.  miralax has helped bowels.  Was previously referred back to GI for evaluation and question of need for EGD given history.  Saw GI 07/08/21 - ordered UGI with barium swallow.  Consider gastric emptying study.  Continue PPI.  Follow.       GERD (gastroesophageal reflux disease)    Continue on protonix.        Hypercholesterolemia    Continue crestor.  Low cholesterol diet and exercise.  Follow lipid panel and liver function tests.   Lab Results  Component Value Date   CHOL 106 07/16/2021   HDL 49.60 07/16/2021  [place] 46 07/16/2021   TRIG 48.0 07/16/2021   CHOLHDL 2 07/16/2021       Hyperlipidemia due to type 1 diabetes mellitus ([place])   Relevant Orders   Lipid panel   Hypertension - Primary    Continue valsartan/hctz and amlodipine.  Continue current medications.  Follow pressures.  Follow metabolic panel.       Relevant Orders   Basic Metabolic Panel (BMET)   Hepatic function panel   Hypothyroidism    On synthroid.  Follow tsh.       Type 1 diabetes mellitus with hyperglycemia ([place])    Followed by endocrinology.  Discussed low carb diet and exercise.  Discussed recent met b and a1c.  Eye exam - no retinopathy 03/2020.  Seeing endocrinology.  Using SSI.  Adjusting.       Relevant Orders   Hemoglobin A1c     Einar Pheasant, MD

## 2021-07-25 ENCOUNTER — Encounter: Payer: Self-pay | Admitting: Internal Medicine

## 2021-07-25 NOTE — Assessment & Plan Note (Signed)
On synthroid.  Follow tsh.   

## 2021-07-25 NOTE — Assessment & Plan Note (Signed)
Continue crestor.  Low cholesterol diet and exercise.  Follow lipid panel and liver function tests.   Lab Results  Component Value Date   CHOL 106 07/16/2021   HDL 49.60 07/16/2021   LDLCALC 46 07/16/2021   TRIG 48.0 07/16/2021   CHOLHDL 2 07/16/2021

## 2021-07-25 NOTE — Assessment & Plan Note (Signed)
Try to limit antiinflammatories.  Stay hydrated.  Follow metabolic panel.  

## 2021-07-25 NOTE — Assessment & Plan Note (Signed)
Followed by endocrinology.  Discussed low carb diet and exercise.  Discussed recent met b and a1c.  Eye exam - no retinopathy 03/2020.  Seeing endocrinology.  Using SSI.  Adjusting.  ?

## 2021-07-25 NOTE — Assessment & Plan Note (Signed)
On zoloft.  Stable.  Follow.  

## 2021-07-25 NOTE — Assessment & Plan Note (Signed)
Has seen [facility].  Carotid duplex 22023  reveals velocities in the 1 to 39% range bilaterally.  No role for intervention at that level.  Continue annual surveillance and her current medical regimen. - continue crestor.  

## 2021-07-25 NOTE — Assessment & Plan Note (Addendum)
History of gastric lymphoma.  Symptoms outlined in last note have improved.  miralax has helped bowels.  Was previously referred back to GI for evaluation and question of need for EGD given history.  Saw GI 07/08/21 - ordered UGI with barium swallow.  Consider gastric emptying study.  Continue PPI.  Follow.

## 2021-07-25 NOTE — Assessment & Plan Note (Signed)
Continue valsartan/hctz and amlodipine.  Continue current medications.  Follow pressures.  Follow metabolic panel.  

## 2021-07-25 NOTE — Assessment & Plan Note (Signed)
Continue on protonix.   

## 2021-07-27 DIAGNOSIS — J301 Allergic rhinitis due to pollen: Secondary | ICD-10-CM | POA: Diagnosis not present

## 2021-07-29 ENCOUNTER — Telehealth: Payer: Self-pay | Admitting: Internal Medicine

## 2021-07-29 NOTE — Telephone Encounter (Signed)
Patient's daughter called, Louisa Second. Patient saw Dr Nicki Reaper on the 07/21/2021. They talked about a MRI of the back and hip. She has not heard back about referral.

## 2021-07-29 NOTE — Telephone Encounter (Signed)
Please call and let her know that we discussed f/u with ortho given concern regarding hip and back  - to determine best next test.

## 2021-07-29 NOTE — Telephone Encounter (Signed)
S/w pt - stated she couldn't remember what had been talked about. Agreeable to Ortho eval. Will call .

## 2021-08-02 DIAGNOSIS — J301 Allergic rhinitis due to pollen: Secondary | ICD-10-CM | POA: Diagnosis not present

## 2021-08-03 ENCOUNTER — Other Ambulatory Visit (INDEPENDENT_AMBULATORY_CARE_PROVIDER_SITE_OTHER): Payer: PPO

## 2021-08-03 DIAGNOSIS — E1065 Type 1 diabetes mellitus with hyperglycemia: Secondary | ICD-10-CM | POA: Diagnosis not present

## 2021-08-03 DIAGNOSIS — E1069 Type 1 diabetes mellitus with other specified complication: Secondary | ICD-10-CM | POA: Diagnosis not present

## 2021-08-03 DIAGNOSIS — M545 Low back pain, unspecified: Secondary | ICD-10-CM | POA: Diagnosis not present

## 2021-08-03 DIAGNOSIS — I10 Essential (primary) hypertension: Secondary | ICD-10-CM | POA: Diagnosis not present

## 2021-08-03 DIAGNOSIS — J301 Allergic rhinitis due to pollen: Secondary | ICD-10-CM | POA: Diagnosis not present

## 2021-08-03 DIAGNOSIS — E785 Hyperlipidemia, unspecified: Secondary | ICD-10-CM

## 2021-08-03 LAB — BASIC METABOLIC PANEL
BUN: 24 mg/dL — ABNORMAL HIGH (ref 6–23)
CO2: 28 mEq/L (ref 19–32)
Calcium: 9.6 mg/dL (ref 8.4–10.5)
Chloride: 104 mEq/L (ref 96–112)
Creatinine, Ser: 0.83 mg/dL (ref 0.40–1.20)
GFR: 62.95 mL/min (ref >60.00)
Glucose, Bld: 157 mg/dL — ABNORMAL HIGH (ref 70–99)
Potassium: 3.9 mEq/L (ref 3.5–5.1)
Sodium: 140 mEq/L (ref 135–145)

## 2021-08-03 LAB — LIPID PANEL
Cholesterol: 107 mg/dL (ref 0–200)
HDL: 45.1 mg/dL (ref >39.00)
LDL Cholesterol: 48 mg/dL (ref 0–99)
NonHDL: 61.69
Total CHOL/HDL Ratio: 2
Triglycerides: 68 mg/dL (ref 0.0–149.0)
VLDL: 13.6 mg/dL (ref 0.0–40.0)

## 2021-08-03 LAB — HEPATIC FUNCTION PANEL
ALT: 14 U/L (ref 0–35)
AST: 14 U/L (ref 0–37)
Albumin: 4 g/dL (ref 3.5–5.2)
Alkaline Phosphatase: 91 U/L (ref 39–117)
Bilirubin, Direct: 0.1 mg/dL (ref 0.0–0.3)
Total Bilirubin: 0.4 mg/dL (ref 0.2–1.2)
Total Protein: 6.4 g/dL (ref 6.0–8.3)

## 2021-08-03 LAB — HEMOGLOBIN A1C: Hgb A1c MFr Bld: 7.9 % — ABNORMAL HIGH (ref 4.6–6.5)

## 2021-08-05 ENCOUNTER — Ambulatory Visit
Admission: RE | Admit: 2021-08-05 | Discharge: 2021-08-05 | Disposition: A | Discharge status: Home / Self Care | Payer: PPO | Source: Ambulatory Visit | Attending: Internal Medicine | Admitting: Internal Medicine

## 2021-08-05 DIAGNOSIS — Z1231 Encounter for screening mammogram for malignant neoplasm of breast: Secondary | ICD-10-CM | POA: Insufficient documentation

## 2021-08-10 ENCOUNTER — Encounter: Payer: Self-pay | Admitting: Internal Medicine

## 2021-08-10 ENCOUNTER — Ambulatory Visit (INDEPENDENT_AMBULATORY_CARE_PROVIDER_SITE_OTHER): Payer: PPO | Admitting: Internal Medicine

## 2021-08-10 DIAGNOSIS — I779 Disorder of arteries and arterioles, unspecified: Secondary | ICD-10-CM | POA: Diagnosis not present

## 2021-08-10 DIAGNOSIS — Q6211 Congenital occlusion of ureteropelvic junction: Secondary | ICD-10-CM | POA: Diagnosis not present

## 2021-08-10 DIAGNOSIS — C8599 Non-Hodgkin lymphoma, unspecified, extranodal and solid organ sites: Secondary | ICD-10-CM | POA: Diagnosis not present

## 2021-08-10 DIAGNOSIS — E7800 Pure hypercholesterolemia, unspecified: Secondary | ICD-10-CM | POA: Diagnosis not present

## 2021-08-10 DIAGNOSIS — E1065 Type 1 diabetes mellitus with hyperglycemia: Secondary | ICD-10-CM | POA: Diagnosis not present

## 2021-08-10 DIAGNOSIS — F419 Anxiety disorder, unspecified: Secondary | ICD-10-CM

## 2021-08-10 DIAGNOSIS — E038 Other specified hypothyroidism: Secondary | ICD-10-CM | POA: Diagnosis not present

## 2021-08-10 DIAGNOSIS — M4800 Spinal stenosis, site unspecified: Secondary | ICD-10-CM

## 2021-08-10 DIAGNOSIS — I10 Essential (primary) hypertension: Secondary | ICD-10-CM

## 2021-08-10 DIAGNOSIS — K219 Gastro-esophageal reflux disease without esophagitis: Secondary | ICD-10-CM

## 2021-08-10 DIAGNOSIS — N182 Chronic kidney disease, stage 2 (mild): Secondary | ICD-10-CM

## 2021-08-10 LAB — HM DIABETES FOOT EXAM

## 2021-08-10 MED ORDER — VALSARTAN-HYDROCHLOROTHIAZIDE 160-12.5 MG PO TABS: 1.0000 | ORAL_TABLET | Freq: Every day | ORAL | 1 refills | Status: DC

## 2021-08-10 MED ORDER — LEVOTHYROXINE SODIUM 200 MCG PO TABS: 200.0000 ug | ORAL_TABLET | Freq: Every day | ORAL | 2 refills | Status: DC

## 2021-08-10 MED ORDER — PANTOPRAZOLE SODIUM 40 MG PO TBEC: 40.0000 mg | DELAYED_RELEASE_TABLET | Freq: Two times a day (BID) | ORAL | 1 refills | Status: DC

## 2021-08-10 NOTE — Progress Notes (Signed)
Patient ID: Marissa Huff, female   DOB: 02-19-1931, 88 y.o.   MRN: [id number]   Subjective:    Patient ID: Marissa Huff, female    DOB: 16-Feb-1931, 88 y.o.   MRN: [id number]   Patient here for a scheduled follow up. Marland Kitchen   HPI Here to follow up regarding her blood pressure, blood sugar and cholesterol.  She is doing relatively well.  Main concern is that of persistent back pain - left lower region - SI joint. Saw ortho 08/03/21 - referred to Dr Alba Destine - SI injection - scheduled for 08/23/21.  Currently taking tramadol.  Worse with standing.  Had been having some epigastric burning - saw GI.  Recommended UGI with barium swallow.  She informed me today that she is interested in EGD instead of UGI.  Discussed f/u with GI to discuss.  Bowels are better.     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[place])    Gastroparesis    GERD (gastroesophageal reflux disease)    Hypertension    Hypothyroidism    PUD (peptic ulcer disease)    Skin cancer    Spinal stenosis    lumbar laminectomy   Past Surgical History:  Procedure Laterality Date   ABDOMINAL HYSTERECTOMY     antrectomy and vagotomy  1994   arthroscopic left knee  12/05/08   BACK SURGERY     carpal tunnel repai  10/10   left    CATARACT EXTRACTION, BILATERAL     EYE SURGERY     bilateral cataracts   gastric lymphoma  1996   surgery   LAPAROSCOPIC CHOLECYSTECTOMY  6/01   LUMBAR LAMINECTOMY  1999   LUMBAR LAMINECTOMY WITH COFLEX 1 LEVEL N/A 09/15/2015   Procedure: Lumbar one-two Laminectomy with placement of coflex;  Surgeon: Kristeen Miss, MD;  Location: [facility];  Service: Neurosurgery;  Laterality: N/A;   PARTIAL HYSTERECTOMY  1960   fibroid tumors, ovaries not removed   tonsillectomy     TONSILLECTOMY     Family History  Problem Relation Age of Onset   Stroke Mother    Stroke  Father    Hypertension Father    Diabetes Brother    Multiple sclerosis Brother    Breast cancer Daughter 32   Colon cancer Neg Hx    Social History   Socioeconomic History   Marital status: Married    Spouse name: Not on file   Number of children: Not on file   Years of education: Not on file   Highest education level: Not on file  Occupational History   Not on file  Tobacco Use   Smoking status: Never   Smokeless tobacco: Never  Vaping Use   Vaping Use: Never used  Substance and Sexual Activity   Alcohol use: No    Alcohol/week: 0.0 standard drinks of alcohol   Drug use: No   Sexual activity: Not Currently    Birth control/protection: None, Post-menopausal  Other Topics Concern   Not on file  Social History Narrative   Not on file   Social Determinants of Health   Financial Resource Strain: Low Risk  (01/28/2021)   Overall Financial Resource Strain (CARDIA)    Difficulty of Paying Living Expenses: Not hard at all  Food Insecurity: No Food Insecurity (01/28/2021)   Hunger  Vital Sign    Worried About Charity fundraiser in the Last Year: Never true    Ran Out of Food in the Last Year: Never true  Transportation Needs: No Transportation Needs (01/28/2021)   PRAPARE - Hydrologist (Medical): No    Lack of Transportation (Non-Medical): No  Physical Activity: Unknown (01/19/2018)   Exercise Vital Sign    Days of Exercise per Week: 0 days    Minutes of Exercise per Session: Not on file  Stress: No Stress Concern Present (01/28/2021)   [place]    Feeling of Stress : Not at all  Social Connections: [place] (01/28/2021)   Social Connection and Isolation Panel [NHANES]    Frequency of Communication with Friends and Family: More than three times a week    Frequency of Social Gatherings with Friends and Family: More than three times a week    Attends Religious Services:  More than 4 times per year    Active Member of [employer] or Organizations: Yes    Attends Archivist Meetings: Not on file    Marital Status: Married     Review of Systems  Constitutional:  Negative for appetite change and unexpected weight change.  HENT:  Negative for congestion and sinus pressure.   Respiratory:  Negative for cough, chest tightness and shortness of breath.   Cardiovascular:  Negative for chest pain, palpitations and leg swelling.  Gastrointestinal:  Negative for diarrhea, nausea and vomiting.       GI issues as outlined.   Genitourinary:  Negative for difficulty urinating and dysuria.  Musculoskeletal:  Positive for back pain. Negative for myalgias.  Skin:  Negative for color change and rash.  Neurological:  Negative for dizziness and headaches.  Psychiatric/Behavioral:  Negative for agitation and dysphoric mood.        Objective:     BP 128/64 (BP Location: Left Arm, Patient Position: Sitting, Cuff Size: Small)   Pulse 76   Temp 98.3 F (36.8 C) (Temporal)   Resp 19   Ht 5' 3"  (1.6 m)   Wt 181 lb 3.2 oz (82.2 kg)   SpO2 98%   BMI 32.10 kg/m  Wt Readings from Last 3 Encounters:  08/10/21 181 lb 3.2 oz (82.2 kg)  07/21/21 183 lb 9.6 oz (83.3 kg)  06/08/21 180 lb 6.4 oz (81.8 kg)    Physical Exam Vitals reviewed.  Constitutional:      General: She is not in acute distress.    Appearance: Normal appearance.  HENT:     Head: Normocephalic and atraumatic.     Right Ear: External ear normal.     Left Ear: External ear normal.  Eyes:     General: No scleral icterus.       Right eye: No discharge.        Left eye: No discharge.     Conjunctiva/sclera: Conjunctivae normal.  Neck:     Thyroid: No thyromegaly.  Cardiovascular:     Rate and Rhythm: Normal rate and regular rhythm.  Pulmonary:     Effort: No respiratory distress.     Breath sounds: Normal breath sounds. No wheezing.  Abdominal:     General: Bowel sounds are normal.      Palpations: Abdomen is soft.     Tenderness: There is no abdominal tenderness.  Musculoskeletal:        General: No swelling or tenderness.  Cervical back: Neck supple. No tenderness.  Lymphadenopathy:     Cervical: No cervical adenopathy.  Skin:    Findings: No erythema or rash.  Neurological:     Mental Status: She is alert.  Psychiatric:        Mood and Affect: Mood normal.        Behavior: Behavior normal.      Outpatient Encounter Medications as of 08/10/2021  Medication Sig   albuterol (VENTOLIN HFA) 108 (90 Base) MCG/ACT inhaler Inhale 2 puffs into the lungs every 6 (six) hours as needed for wheezing or shortness of breath.   amLODipine (NORVASC) 5 MG tablet TAKE 1 TABLET (5 MG TOTAL) BY MOUTH DAILY.   aspirin 81 MG tablet Take 81 mg by mouth daily.   BD PEN NEEDLE NANO U/F 32G X 4 MM MISC USE 1 EACH 4 (FOUR) TIMES DAILY   Calcium Citrate-Vitamin D (CALCIUM + D PO) Take 1 tablet by mouth daily.   celecoxib (CELEBREX) 200 MG capsule Take by mouth 2 (two) times daily.   Continuous Blood Gluc Receiver (FREESTYLE LIBRE 14 DAY READER) DEVI Use 1 each as needed .  Use to test blood sugar   Continuous Blood Gluc Sensor (FREESTYLE LIBRE 14 DAY SENSOR) MISC Use 1 each every 14 (fourteen) days   doxylamine, Sleep, (UNISOM) 25 MG tablet Take 1 tablet by mouth at bedtime as needed.   fexofenadine (ALLEGRA) 180 MG tablet Take 180 mg by mouth daily.   fluticasone (FLONASE) 50 MCG/ACT nasal spray SPRAY 2 SPRAYS INTO EACH NOSTRIL EVERY DAY   glucagon (GLUCAGON EMERGENCY) 1 MG injection Inject 1 mg into the muscle once as needed.   glucose blood test strip    insulin aspart (NOVOLOG FLEXPEN) 100 UNIT/ML FlexPen Inject 10 Units into the skin in the morning, at noon, and at bedtime.   ipratropium (ATROVENT) 0.06 % nasal spray PLACE 2 SPRAYS INTO BOTH NOSTRILS 4 (FOUR) TIMES DAILY AS NEEDED FOR RHINITIS.   LANTUS SOLOSTAR 100 UNIT/ML Solostar Pen Inject 28-30 Units into the skin at bedtime.    LORazepam (ATIVAN) 0.5 MG tablet Take 0.5-1 tablets (0.25-0.5 mg total) by mouth daily as needed for anxiety.   metFORMIN (GLUCOPHAGE-XR) 500 MG 24 hr tablet TAKE 1 TABLET BY MOUTH TWICE A DAY   Multiple Vitamins-Minerals (MULTIVITAMIN PO) Take 1 tablet by mouth daily.    Multiple Vitamins-Minerals (PRESERVISION AREDS PO) Take 1 capsule by mouth 2 (two) times daily.   rosuvastatin (CRESTOR) 5 MG tablet TAKE 1 TABLET BY MOUTH THREE TIMES A WEEK.   sertraline (ZOLOFT) 25 MG tablet TAKE 1 TABLET (25 MG TOTAL) BY MOUTH DAILY.   traMADol (ULTRAM) 50 MG tablet Take 50 mg by mouth every 6 (six) hours as needed.   [DISCONTINUED] levothyroxine (SYNTHROID) 200 MCG tablet Take 1 tablet (200 mcg total) by mouth daily before breakfast.   [DISCONTINUED] pantoprazole (PROTONIX) 40 MG tablet TAKE 1 TABLET BY MOUTH TWICE A DAY   [DISCONTINUED] valsartan-hydrochlorothiazide (DIOVAN-HCT) 160-12.5 MG tablet TAKE 1 TABLET BY MOUTH EVERY DAY   levothyroxine (SYNTHROID) 200 MCG tablet Take 1 tablet (200 mcg total) by mouth daily before breakfast.   pantoprazole (PROTONIX) 40 MG tablet Take 1 tablet (40 mg total) by mouth 2 (two) times daily.   valsartan-hydrochlorothiazide (DIOVAN-HCT) 160-12.5 MG tablet Take 1 tablet by mouth daily.   No facility-administered encounter medications on file as of 08/10/2021.     Lab Results  Component Value Date   WBC 4.9 03/04/2021   HGB 12.0  03/04/2021   HCT 35.8 (L) 03/04/2021   PLT 203.0 03/04/2021   GLUCOSE 157 (H) 08/03/2021   CHOL 107 08/03/2021   TRIG 68.0 08/03/2021   HDL 45.10 08/03/2021   LDLCALC 48 08/03/2021   ALT 14 08/03/2021   AST 14 08/03/2021   NA 140 08/03/2021   K 3.9 08/03/2021   CL 104 08/03/2021   CREATININE 0.83 08/03/2021   BUN 24 (H) 08/03/2021   CO2 28 08/03/2021   TSH 3.51 07/16/2021   HGBA1C 7.9 (H) 08/03/2021   MICROALBUR 2.3 (H) 07/16/2021    MM 3D SCREEN BREAST BILATERAL  Result Date: 08/06/2021 CLINICAL DATA:  Screening. EXAM: DIGITAL  SCREENING BILATERAL MAMMOGRAM WITH TOMOSYNTHESIS AND CAD TECHNIQUE: Bilateral screening digital craniocaudal and mediolateral oblique mammograms were obtained. Bilateral screening digital breast tomosynthesis was performed. The images were evaluated with computer-aided detection. COMPARISON:  Previous exam(s). ACR Breast Density Category b: There are scattered areas of fibroglandular density. FINDINGS: There are no findings suspicious for malignancy. IMPRESSION: No mammographic evidence of malignancy. A result letter of this screening mammogram will be mailed directly to the patient. RECOMMENDATION: Screening mammogram in one year. (Code:SM-B-01Y) BI-RADS CATEGORY  1: Negative. Electronically Signed   By: Claudie Revering M.D.   On: 08/06/2021 15:25       Assessment & Plan:   Problem List Items Addressed This Visit     Anxiety    On zoloft.  Stable.  Follow.       Carotid artery disease, unspecified laterality ([place])    Has seen [facility].  Carotid duplex 22023  reveals velocities in the 1 to 39% range bilaterally.  No role for intervention at that level.  Continue annual surveillance and her current medical regimen. - continue crestor.       Relevant Medications   valsartan-hydrochlorothiazide (DIOVAN-HCT) 160-12.5 MG tablet   CKD (chronic kidney disease) stage 2, GFR 60-89 ml/min    Try to limit antiinflammatories.  Stay hydrated.  Follow metabolic panel.       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 miralax has helped bowels.  Was previously referred back to GI for evaluation and question of need for EGD given history.  Saw GI 07/08/21 - ordered UGI with barium swallow.  Pt prefers EGD. Discussed need for f/u with GI. Continue PPI.  Follow.       Relevant Medications   traMADol (ULTRAM) 50 MG tablet   GERD (gastroesophageal reflux disease)    Continue on protonix.        Relevant Medications   pantoprazole (PROTONIX) 40 MG tablet   Hydronephrosis    Saw Dr Erlene Quan 02/2021 -  secondary to UPJ obstruction being managed conservatively. Asymptomatic. Recommended renal ultrasound and f/u in one year.         Hypercholesterolemia    Continue crestor.  Low cholesterol diet and exercise.  Follow lipid panel and liver function tests.   Lab Results  Component Value Date   CHOL 107 08/03/2021   HDL 45.10 08/03/2021   LDLCALC 48 08/03/2021   TRIG 68.0 08/03/2021   CHOLHDL 2 08/03/2021       Relevant Medications   valsartan-hydrochlorothiazide (DIOVAN-HCT) 160-12.5 MG tablet   Other Relevant Orders   Lipid panel   Hepatic function panel   Hypertension    Continue valsartan/hctz and amlodipine.  Continue current medications.  Follow pressures.  Follow metabolic panel.       Relevant Medications   valsartan-hydrochlorothiazide (DIOVAN-HCT) 160-12.5 MG tablet  Hypothyroidism    On synthroid.  Follow tsh.       Relevant Medications   levothyroxine (SYNTHROID) 200 MCG tablet   Other Relevant Orders   TSH   Spinal stenosis    Just saw ortho as outlined.  Scheduled to see Dr Alba Destine for injection.  Follow.  Ortho has prescribed tramadol.       Type 1 diabetes mellitus with hyperglycemia ([place])    Followed by endocrinology.  Discussed low carb diet and exercise.  Follow met b and a1c. Eye exam - no retinopathy 03/2020.  Seeing endocrinology.  Using SSI.  Adjusting.       Relevant Medications   valsartan-hydrochlorothiazide (DIOVAN-HCT) 160-12.5 MG tablet   Other Relevant Orders   Hemoglobin B4W   Basic metabolic panel     Einar Pheasant, MD

## 2021-08-17 DIAGNOSIS — J301 Allergic rhinitis due to pollen: Secondary | ICD-10-CM | POA: Diagnosis not present

## 2021-08-21 NOTE — Assessment & Plan Note (Signed)
On zoloft.  Stable.  Follow.  

## 2021-08-21 NOTE — Assessment & Plan Note (Signed)
On synthroid.  Follow tsh.   

## 2021-08-21 NOTE — Assessment & Plan Note (Signed)
History of gastric lymphoma. miralax has helped bowels.  Was previously referred back to GI for evaluation and question of need for EGD given history.  Saw GI 07/08/21 - ordered UGI with barium swallow.  Pt prefers EGD. Discussed need for f/u with GI. Continue PPI.  Follow.

## 2021-08-21 NOTE — Assessment & Plan Note (Signed)
Continue on protonix.   

## 2021-08-21 NOTE — Assessment & Plan Note (Signed)
Has seen [facility].  Carotid duplex 22023  reveals velocities in the 1 to 39% range bilaterally.  No role for intervention at that level.  Continue annual surveillance and her current medical regimen. - continue crestor.  

## 2021-08-21 NOTE — Assessment & Plan Note (Signed)
Just saw ortho as outlined.  Scheduled to see Dr Alba Destine for injection.  Follow.  Ortho has prescribed tramadol.

## 2021-08-21 NOTE — Assessment & Plan Note (Signed)
Saw Dr Brandon 02/2021 - secondary to UPJ obstruction being managed conservatively. Asymptomatic. Recommended renal ultrasound and f/u in one year.    

## 2021-08-21 NOTE — Assessment & Plan Note (Signed)
Try to limit antiinflammatories.  Stay hydrated.  Follow metabolic panel.  

## 2021-08-21 NOTE — Assessment & Plan Note (Signed)
Followed by endocrinology.  Discussed low carb diet and exercise.  Follow met b and a1c. Eye exam - no retinopathy 03/2020.  Seeing endocrinology.  Using SSI.  Adjusting.

## 2021-08-21 NOTE — Assessment & Plan Note (Signed)
Continue valsartan/hctz and amlodipine.  Continue current medications.  Follow pressures.  Follow metabolic panel.  

## 2021-08-21 NOTE — Assessment & Plan Note (Signed)
Continue crestor.  Low cholesterol diet and exercise.  Follow lipid panel and liver function tests.   Lab Results  Component Value Date   CHOL 107 08/03/2021   HDL 45.10 08/03/2021   LDLCALC 48 08/03/2021   TRIG 68.0 08/03/2021   CHOLHDL 2 08/03/2021

## 2021-08-23 ENCOUNTER — Other Ambulatory Visit: Payer: Self-pay | Admitting: Physical Medicine & Rehabilitation

## 2021-08-23 DIAGNOSIS — M5441 Lumbago with sciatica, right side: Secondary | ICD-10-CM | POA: Diagnosis not present

## 2021-08-23 DIAGNOSIS — G8929 Other chronic pain: Secondary | ICD-10-CM | POA: Diagnosis not present

## 2021-08-23 DIAGNOSIS — M5442 Lumbago with sciatica, left side: Secondary | ICD-10-CM | POA: Diagnosis not present

## 2021-08-24 DIAGNOSIS — J301 Allergic rhinitis due to pollen: Secondary | ICD-10-CM | POA: Diagnosis not present

## 2021-08-31 DIAGNOSIS — J301 Allergic rhinitis due to pollen: Secondary | ICD-10-CM | POA: Diagnosis not present

## 2021-09-01 ENCOUNTER — Ambulatory Visit
Admission: RE | Admit: 2021-09-01 | Discharge: 2021-09-01 | Disposition: A | Discharge status: Home / Self Care | Payer: PPO | Source: Ambulatory Visit | Attending: Physical Medicine & Rehabilitation | Admitting: Physical Medicine & Rehabilitation

## 2021-09-01 DIAGNOSIS — M5441 Lumbago with sciatica, right side: Secondary | ICD-10-CM | POA: Insufficient documentation

## 2021-09-01 DIAGNOSIS — M5442 Lumbago with sciatica, left side: Secondary | ICD-10-CM | POA: Insufficient documentation

## 2021-09-01 DIAGNOSIS — M5136 Other intervertebral disc degeneration, lumbar region: Secondary | ICD-10-CM | POA: Diagnosis not present

## 2021-09-01 DIAGNOSIS — M5126 Other intervertebral disc displacement, lumbar region: Secondary | ICD-10-CM | POA: Diagnosis not present

## 2021-09-03 DIAGNOSIS — M48062 Spinal stenosis, lumbar region with neurogenic claudication: Secondary | ICD-10-CM | POA: Diagnosis not present

## 2021-09-03 DIAGNOSIS — M5441 Lumbago with sciatica, right side: Secondary | ICD-10-CM | POA: Diagnosis not present

## 2021-09-03 DIAGNOSIS — G8929 Other chronic pain: Secondary | ICD-10-CM | POA: Diagnosis not present

## 2021-09-07 DIAGNOSIS — M48062 Spinal stenosis, lumbar region with neurogenic claudication: Secondary | ICD-10-CM | POA: Diagnosis not present

## 2021-09-07 DIAGNOSIS — M5416 Radiculopathy, lumbar region: Secondary | ICD-10-CM | POA: Diagnosis not present

## 2021-09-14 DIAGNOSIS — J301 Allergic rhinitis due to pollen: Secondary | ICD-10-CM | POA: Diagnosis not present

## 2021-09-21 DIAGNOSIS — J301 Allergic rhinitis due to pollen: Secondary | ICD-10-CM | POA: Diagnosis not present

## 2021-09-22 ENCOUNTER — Telehealth: Payer: Self-pay | Admitting: Internal Medicine

## 2021-09-22 NOTE — Telephone Encounter (Signed)
Called and left message with [facility] - [facility]). that pt preferred not to have UGI with barium swallow.  Preferred EGD.

## 2021-09-28 DIAGNOSIS — M48062 Spinal stenosis, lumbar region with neurogenic claudication: Secondary | ICD-10-CM | POA: Diagnosis not present

## 2021-09-28 DIAGNOSIS — J301 Allergic rhinitis due to pollen: Secondary | ICD-10-CM | POA: Diagnosis not present

## 2021-09-28 DIAGNOSIS — M5136 Other intervertebral disc degeneration, lumbar region: Secondary | ICD-10-CM | POA: Diagnosis not present

## 2021-09-28 DIAGNOSIS — M5416 Radiculopathy, lumbar region: Secondary | ICD-10-CM | POA: Diagnosis not present

## 2021-10-05 DIAGNOSIS — H353132 Nonexudative age-related macular degeneration, bilateral, intermediate dry stage: Secondary | ICD-10-CM | POA: Diagnosis not present

## 2021-10-05 LAB — HM DIABETES EYE EXAM

## 2021-10-06 ENCOUNTER — Other Ambulatory Visit: Payer: Self-pay | Admitting: Internal Medicine

## 2021-10-08 DIAGNOSIS — E039 Hypothyroidism, unspecified: Secondary | ICD-10-CM | POA: Diagnosis not present

## 2021-10-08 DIAGNOSIS — E1159 Type 2 diabetes mellitus with other circulatory complications: Secondary | ICD-10-CM | POA: Diagnosis not present

## 2021-10-08 DIAGNOSIS — E785 Hyperlipidemia, unspecified: Secondary | ICD-10-CM | POA: Diagnosis not present

## 2021-10-08 DIAGNOSIS — R809 Proteinuria, unspecified: Secondary | ICD-10-CM | POA: Diagnosis not present

## 2021-10-08 DIAGNOSIS — E1069 Type 1 diabetes mellitus with other specified complication: Secondary | ICD-10-CM | POA: Diagnosis not present

## 2021-10-08 DIAGNOSIS — I152 Hypertension secondary to endocrine disorders: Secondary | ICD-10-CM | POA: Diagnosis not present

## 2021-10-08 DIAGNOSIS — E1029 Type 1 diabetes mellitus with other diabetic kidney complication: Secondary | ICD-10-CM | POA: Diagnosis not present

## 2021-10-12 DIAGNOSIS — E1029 Type 1 diabetes mellitus with other diabetic kidney complication: Secondary | ICD-10-CM | POA: Diagnosis not present

## 2021-10-12 DIAGNOSIS — J301 Allergic rhinitis due to pollen: Secondary | ICD-10-CM | POA: Diagnosis not present

## 2021-10-18 ENCOUNTER — Other Ambulatory Visit (INDEPENDENT_AMBULATORY_CARE_PROVIDER_SITE_OTHER): Payer: PPO

## 2021-10-18 DIAGNOSIS — K219 Gastro-esophageal reflux disease without esophagitis: Secondary | ICD-10-CM

## 2021-10-18 DIAGNOSIS — E1065 Type 1 diabetes mellitus with hyperglycemia: Secondary | ICD-10-CM

## 2021-10-18 DIAGNOSIS — R142 Eructation: Secondary | ICD-10-CM

## 2021-10-18 DIAGNOSIS — E7800 Pure hypercholesterolemia, unspecified: Secondary | ICD-10-CM

## 2021-10-18 DIAGNOSIS — E038 Other specified hypothyroidism: Secondary | ICD-10-CM | POA: Diagnosis not present

## 2021-10-18 DIAGNOSIS — R1013 Epigastric pain: Secondary | ICD-10-CM

## 2021-10-18 DIAGNOSIS — Z8572 Personal history of non-Hodgkin lymphomas: Secondary | ICD-10-CM

## 2021-10-18 LAB — LIPID PANEL
Cholesterol: 96 mg/dL (ref 0–200)
HDL: 45.8 mg/dL (ref >39.00)
LDL Cholesterol: 40 mg/dL (ref 0–99)
NonHDL: 50.13
Total CHOL/HDL Ratio: 2
Triglycerides: 53 mg/dL (ref 0.0–149.0)
VLDL: 10.6 mg/dL (ref 0.0–40.0)

## 2021-10-18 LAB — HEMOGLOBIN A1C: Hgb A1c MFr Bld: 7 % — ABNORMAL HIGH (ref 4.6–6.5)

## 2021-10-18 LAB — BASIC METABOLIC PANEL
BUN: 22 mg/dL (ref 6–23)
CO2: 28 mEq/L (ref 19–32)
Calcium: 9.1 mg/dL (ref 8.4–10.5)
Chloride: 103 mEq/L (ref 96–112)
Creatinine, Ser: 0.84 mg/dL (ref 0.40–1.20)
GFR: 61.96 mL/min (ref >60.00)
Glucose, Bld: 81 mg/dL (ref 70–99)
Potassium: 4.1 mEq/L (ref 3.5–5.1)
Sodium: 141 mEq/L (ref 135–145)

## 2021-10-18 LAB — TSH: TSH: 5.18 u[IU]/mL (ref 0.35–5.50)

## 2021-10-18 LAB — HEPATIC FUNCTION PANEL
ALT: 15 U/L (ref 0–35)
AST: 15 U/L (ref 0–37)
Albumin: 3.8 g/dL (ref 3.5–5.2)
Alkaline Phosphatase: 83 U/L (ref 39–117)
Bilirubin, Direct: 0.1 mg/dL (ref 0.0–0.3)
Total Bilirubin: 0.5 mg/dL (ref 0.2–1.2)
Total Protein: 6.1 g/dL (ref 6.0–8.3)

## 2021-10-21 ENCOUNTER — Ambulatory Visit: Payer: PPO | Admitting: Internal Medicine

## 2021-10-21 DIAGNOSIS — M48062 Spinal stenosis, lumbar region with neurogenic claudication: Secondary | ICD-10-CM | POA: Diagnosis not present

## 2021-10-21 DIAGNOSIS — M5416 Radiculopathy, lumbar region: Secondary | ICD-10-CM | POA: Diagnosis not present

## 2021-10-22 DIAGNOSIS — J301 Allergic rhinitis due to pollen: Secondary | ICD-10-CM | POA: Diagnosis not present

## 2021-10-26 DIAGNOSIS — J301 Allergic rhinitis due to pollen: Secondary | ICD-10-CM | POA: Diagnosis not present

## 2021-10-28 ENCOUNTER — Ambulatory Visit (INDEPENDENT_AMBULATORY_CARE_PROVIDER_SITE_OTHER): Payer: PPO

## 2021-10-28 ENCOUNTER — Encounter: Payer: Self-pay | Admitting: Internal Medicine

## 2021-10-28 ENCOUNTER — Ambulatory Visit (INDEPENDENT_AMBULATORY_CARE_PROVIDER_SITE_OTHER): Payer: PPO | Admitting: Internal Medicine

## 2021-10-28 VITALS — BP 120/58 | HR 72 | Temp 98.6°F | Ht 63.0 in | Wt 180.6 lb

## 2021-10-28 DIAGNOSIS — W19XXXD Unspecified fall, subsequent encounter: Secondary | ICD-10-CM | POA: Diagnosis not present

## 2021-10-28 DIAGNOSIS — I779 Disorder of arteries and arterioles, unspecified: Secondary | ICD-10-CM | POA: Diagnosis not present

## 2021-10-28 DIAGNOSIS — F419 Anxiety disorder, unspecified: Secondary | ICD-10-CM

## 2021-10-28 DIAGNOSIS — N182 Chronic kidney disease, stage 2 (mild): Secondary | ICD-10-CM

## 2021-10-28 DIAGNOSIS — R053 Chronic cough: Secondary | ICD-10-CM

## 2021-10-28 DIAGNOSIS — E038 Other specified hypothyroidism: Secondary | ICD-10-CM | POA: Diagnosis not present

## 2021-10-28 DIAGNOSIS — E7800 Pure hypercholesterolemia, unspecified: Secondary | ICD-10-CM | POA: Diagnosis not present

## 2021-10-28 DIAGNOSIS — K219 Gastro-esophageal reflux disease without esophagitis: Secondary | ICD-10-CM | POA: Diagnosis not present

## 2021-10-28 DIAGNOSIS — W19XXXA Unspecified fall, initial encounter: Secondary | ICD-10-CM

## 2021-10-28 DIAGNOSIS — C8599 Non-Hodgkin lymphoma, unspecified, extranodal and solid organ sites: Secondary | ICD-10-CM

## 2021-10-28 DIAGNOSIS — I10 Essential (primary) hypertension: Secondary | ICD-10-CM | POA: Diagnosis not present

## 2021-10-28 DIAGNOSIS — E1065 Type 1 diabetes mellitus with hyperglycemia: Secondary | ICD-10-CM | POA: Diagnosis not present

## 2021-10-28 DIAGNOSIS — Z23 Encounter for immunization: Secondary | ICD-10-CM | POA: Diagnosis not present

## 2021-10-28 DIAGNOSIS — R059 Cough, unspecified: Secondary | ICD-10-CM | POA: Diagnosis not present

## 2021-10-28 NOTE — Progress Notes (Signed)
Patient ID: Marissa Huff, female   DOB: 07/01/1931, 88 y.o.   MRN: [id number]   Subjective:    Patient ID: Marissa Huff, female    DOB: [birth year], 88 y.o.   MRN: [id number]   Patient here for  Chief Complaint  Patient presents with   Follow-up    3 month f/u   .   HPI Here to follow up regarding her blood pressure, blood sugar and cholesterol.  Sees Dr Arby Barrette for her diabetes.  Sugars - ok.  Lantus decreased to 24 units.  Libre 3.  Is s/p ESI L2-L3 - Dr Sharlet Salina - doing better.  No chest pain.  Increased cough.  Taking mucinex daily.  Reports trouble getting her breath at times.  Request refill albuterol.  States this helps.  Relates to her lungs.  Feels from a cardiac standpoint things are stable.  Taking allergy shots.  S/p fall a couple of weeks ago.  Reports no residual problems.     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[place])    Gastroparesis    GERD (gastroesophageal reflux disease)    Hypertension    Hypothyroidism    PUD (peptic ulcer disease)    Skin cancer    Spinal stenosis    lumbar laminectomy   Past Surgical History:  Procedure Laterality Date   ABDOMINAL HYSTERECTOMY     antrectomy and vagotomy  1994   arthroscopic left knee  12/05/08   BACK SURGERY     carpal tunnel repai  10/10   left    CATARACT EXTRACTION, BILATERAL     EYE SURGERY     bilateral cataracts   gastric lymphoma  1996   surgery   LAPAROSCOPIC CHOLECYSTECTOMY  6/01   LUMBAR LAMINECTOMY  1999   LUMBAR LAMINECTOMY WITH COFLEX 1 LEVEL N/A 09/15/2015   Procedure: Lumbar one-two Laminectomy with placement of coflex;  Surgeon: Kristeen Miss, MD;  Location: [facility];  Service: Neurosurgery;  Laterality: N/A;   PARTIAL HYSTERECTOMY  1960   fibroid tumors, ovaries not removed   tonsillectomy     TONSILLECTOMY     Family History  Problem  Relation Age of Onset   Stroke Mother    Stroke Father    Hypertension Father    Diabetes Brother    Multiple sclerosis Brother    Breast cancer Daughter 4   Colon cancer Neg Hx    Social History   Socioeconomic History   Marital status: Married    Spouse name: Not on file   Number of children: Not on file   Years of education: Not on file   Highest education level: Not on file  Occupational History   Not on file  Tobacco Use   Smoking status: Never   Smokeless tobacco: Never  Vaping Use   Vaping Use: Never used  Substance and Sexual Activity   Alcohol use: No    Alcohol/week: 0.0 standard drinks of alcohol   Drug use: No   Sexual activity: Not Currently    Birth control/protection: None, Post-menopausal  Other Topics Concern   Not on file  Social History Narrative   Not on file   Social Determinants of Health   Financial Resource Strain: Low Risk  (01/28/2021)   Overall Financial Resource Strain (CARDIA)    Difficulty of  Paying Living Expenses: Not hard at all  Food Insecurity: No Food Insecurity (01/28/2021)   Hunger Vital Sign    Worried About Running Out of Food in the Last Year: Never true    Ran Out of Food in the Last Year: Never true  Transportation Needs: No Transportation Needs (01/28/2021)   PRAPARE - Hydrologist (Medical): No    Lack of Transportation (Non-Medical): No  Physical Activity: Unknown (01/19/2018)   Exercise Vital Sign    Days of Exercise per Week: 0 days    Minutes of Exercise per Session: Not on file  Stress: No Stress Concern Present (01/28/2021)   [place]    Feeling of Stress : Not at all  Social Connections: [place] (01/28/2021)   Social Connection and Isolation Panel [NHANES]    Frequency of Communication with Friends and Family: More than three times a week    Frequency of Social Gatherings with Friends and Family: More than  three times a week    Attends Religious Services: More than 4 times per year    Active Member of [employer] or Organizations: Yes    Attends Archivist Meetings: Not on file    Marital Status: Married     Review of Systems  Constitutional:  Negative for appetite change and unexpected weight change.  HENT:  Negative for congestion and sinus pressure.   Respiratory:  Positive for cough. Negative for chest tightness and shortness of breath.   Cardiovascular:  Negative for chest pain, palpitations and leg swelling.  Gastrointestinal:  Negative for abdominal pain, nausea and vomiting.  Genitourinary:  Negative for difficulty urinating and dysuria.  Musculoskeletal:  Negative for joint swelling and myalgias.       Back doing better s/p injection.   Skin:  Negative for color change and rash.  Neurological:  Negative for dizziness and headaches.  Psychiatric/Behavioral:  Negative for agitation and dysphoric mood.        Objective:     BP (!) 120/58 (BP Location: Left Arm, Patient Position: Sitting, Cuff Size: Normal)   Pulse 72   Temp 98.6 F (37 C) (Oral)   Ht 5' 3"  (1.6 m)   Wt 180 lb 9.6 oz (81.9 kg)   SpO2 97%   BMI 31.99 kg/m  Wt Readings from Last 3 Encounters:  10/28/21 180 lb 9.6 oz (81.9 kg)  08/10/21 181 lb 3.2 oz (82.2 kg)  07/21/21 183 lb 9.6 oz (83.3 kg)    Physical Exam Vitals reviewed.  Constitutional:      General: She is not in acute distress.    Appearance: Normal appearance.  HENT:     Head: Normocephalic and atraumatic.     Right Ear: External ear normal.     Left Ear: External ear normal.  Eyes:     General: No scleral icterus.       Right eye: No discharge.        Left eye: No discharge.     Conjunctiva/sclera: Conjunctivae normal.  Neck:     Thyroid: No thyromegaly.  Cardiovascular:     Rate and Rhythm: Normal rate and regular rhythm.  Pulmonary:     Effort: No respiratory distress.     Breath sounds: Normal breath sounds. No wheezing.   Abdominal:     General: Bowel sounds are normal.     Palpations: Abdomen is soft.     Tenderness: There is no abdominal  tenderness.  Musculoskeletal:        General: No swelling or tenderness.     Cervical back: Neck supple. No tenderness.  Lymphadenopathy:     Cervical: No cervical adenopathy.  Skin:    Findings: No erythema or rash.  Neurological:     Mental Status: She is alert.  Psychiatric:        Mood and Affect: Mood normal.        Behavior: Behavior normal.      Outpatient Encounter Medications as of 10/28/2021  Medication Sig   amLODipine (NORVASC) 5 MG tablet TAKE 1 TABLET (5 MG TOTAL) BY MOUTH DAILY.   aspirin 81 MG tablet Take 81 mg by mouth daily.   BD PEN NEEDLE NANO U/F 32G X 4 MM MISC USE 1 EACH 4 (FOUR) TIMES DAILY   Calcium Citrate-Vitamin D (CALCIUM + D PO) Take 1 tablet by mouth daily.   celecoxib (CELEBREX) 200 MG capsule Take by mouth 2 (two) times daily.   Continuous Blood Gluc Receiver (FREESTYLE LIBRE 14 DAY READER) DEVI Use 1 each as needed .  Use to test blood sugar   Continuous Blood Gluc Sensor (FREESTYLE LIBRE 14 DAY SENSOR) MISC Use 1 each every 14 (fourteen) days   fexofenadine (ALLEGRA) 180 MG tablet Take 180 mg by mouth daily.   fluticasone (FLONASE) 50 MCG/ACT nasal spray SPRAY 2 SPRAYS INTO EACH NOSTRIL EVERY DAY   glucagon (GLUCAGON EMERGENCY) 1 MG injection Inject 1 mg into the muscle once as needed.   glucose blood test strip    insulin aspart (NOVOLOG FLEXPEN) 100 UNIT/ML FlexPen Inject 10 Units into the skin in the morning, at noon, and at bedtime.   LANTUS SOLOSTAR 100 UNIT/ML Solostar Pen Inject 28-30 Units into the skin at bedtime.   levothyroxine (SYNTHROID) 200 MCG tablet Take 1 tablet (200 mcg total) by mouth daily before breakfast.   Multiple Vitamins-Minerals (MULTIVITAMIN PO) Take 1 tablet by mouth daily.    pantoprazole (PROTONIX) 40 MG tablet Take 1 tablet (40 mg total) by mouth 2 (two) times daily.   rosuvastatin (CRESTOR) 5  MG tablet TAKE 1 TABLET BY MOUTH THREE TIMES A WEEK.   sertraline (ZOLOFT) 25 MG tablet TAKE 1 TABLET (25 MG TOTAL) BY MOUTH DAILY.   traMADol (ULTRAM) 50 MG tablet Take 50 mg by mouth every 6 (six) hours as needed.   valsartan-hydrochlorothiazide (DIOVAN-HCT) 160-12.5 MG tablet Take 1 tablet by mouth daily.   [DISCONTINUED] albuterol (VENTOLIN HFA) 108 (90 Base) MCG/ACT inhaler Inhale 2 puffs into the lungs every 6 (six) hours as needed for wheezing or shortness of breath.   albuterol (VENTOLIN HFA) 108 (90 Base) MCG/ACT inhaler Inhale 2 puffs into the lungs every 6 (six) hours as needed for wheezing or shortness of breath.   Multiple Vitamins-Minerals (PRESERVISION AREDS PO) Take 1 capsule by mouth 2 (two) times daily.   [DISCONTINUED] doxylamine, Sleep, (UNISOM) 25 MG tablet Take 1 tablet by mouth at bedtime as needed. (Patient not taking: Reported on 10/28/2021)   [DISCONTINUED] ipratropium (ATROVENT) 0.06 % nasal spray PLACE 2 SPRAYS INTO BOTH NOSTRILS 4 (FOUR) TIMES DAILY AS NEEDED FOR RHINITIS. (Patient not taking: Reported on 10/28/2021)   [DISCONTINUED] LORazepam (ATIVAN) 0.5 MG tablet Take 0.5-1 tablets (0.25-0.5 mg total) by mouth daily as needed for anxiety. (Patient not taking: Reported on 10/28/2021)   [DISCONTINUED] metFORMIN (GLUCOPHAGE-XR) 500 MG 24 hr tablet TAKE 1 TABLET BY MOUTH TWICE A DAY (Patient not taking: Reported on 10/28/2021)   No facility-administered  encounter medications on file as of 10/28/2021.     Lab Results  Component Value Date   WBC 4.9 03/04/2021   HGB 12.0 03/04/2021   HCT 35.8 (L) 03/04/2021   PLT 203.0 03/04/2021   GLUCOSE 81 10/18/2021   CHOL 96 10/18/2021   TRIG 53.0 10/18/2021   HDL 45.80 10/18/2021   LDLCALC 40 10/18/2021   ALT 15 10/18/2021   AST 15 10/18/2021   NA 141 10/18/2021   K 4.1 10/18/2021   CL 103 10/18/2021   CREATININE 0.84 10/18/2021   BUN 22 10/18/2021   CO2 28 10/18/2021   TSH 5.18 10/18/2021   HGBA1C 7.0 (H) 10/18/2021    MICROALBUR 2.3 (H) 07/16/2021    MR LUMBAR SPINE WO CONTRAST  Result Date: 09/01/2021 CLINICAL DATA:  Bilateral low back pain with bilateral sciatica, unspecified chronicity; technologist note states low back pain and burning sensation EXAM: MRI LUMBAR SPINE WITHOUT CONTRAST TECHNIQUE: Multiplanar, multisequence MR imaging of the lumbar spine was performed. No intravenous contrast was administered. COMPARISON:  2017 FINDINGS: Segmentation:  Standard. Alignment: Levocurvature superiorly. Retrolisthesis at T12-L1 and L1-L2. Vertebrae: Postoperative changes including interspinous fusion device at L1-L2 and interbody spacers at L2-L3, L3-L4, and L4-L5. The hardware is not well evaluated and there is associated susceptibility artifact. Laminectomies at L2-L3, L3-L4, and L4-L5. Degenerative endplate irregularity. There is associated marrow edema at T11-T12. No suspicious osseous lesion. Conus medullaris and cauda equina: Conus extends to the L1 level. Conus and cauda equina appear normal. Paraspinal and other soft tissues: Chronic postoperative changes. Stone within the dilated right renal collecting system also present on 2020 CT abdomen. Disc levels: Disc bulges and facet arthropathy at lower thoracic levels. Mild canal stenosis at T12-L1. Marked right foraminal stenosis at T11-T12 and T12-L1. L1-L2: Retrolisthesis with uncovering of disc. Facet arthropathy. Mild canal stenosis. Marked foraminal stenosis. L2-L3: Canal remains decompressed. Foramina are distorted by artifact without definite stenosis. L3-L4: Canal remains decompressed. Foramina are distorted by artifact without definite stenosis. L4-L5: Canal remains decompressed. Foramina are distorted by artifact without definite stenosis. L5-S1: Disc bulge. Marked facet arthropathy. No canal stenosis. Narrowing of subarticular recesses. Minor foraminal stenosis. IMPRESSION: Degenerative and postoperative changes as detailed above. There is no significant canal  stenosis. Marked right foraminal narrowing at T11-T12 and T12-L1 and bilateral foraminal narrowing at L1-L2. Electronically Signed   By: Macy Mis M.D.   On: 09/01/2021 16:14       Assessment & Plan:   Problem List Items Addressed This Visit     Anxiety    On zoloft.  Stable.  Follow.       Carotid artery disease, unspecified laterality ([place])    Has seen [facility].  Carotid duplex 22023  reveals velocities in the 1 to 39% range bilaterally.  No role for intervention at that level.  Continue annual surveillance and her current medical regimen. - continue crestor.       CKD (chronic kidney disease) stage 2, GFR 60-89 ml/min    Try to limit antiinflammatories.  Stay hydrated.  Follow metabolic panel.       Fall    Recent fall.  Denies any residual problems.  Follow.       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 miralax has helped bowels.  Was previously referred back to GI for evaluation and question of need for EGD given history.  Saw GI 07/08/21 - ordered UGI with barium swallow.  Pt had previously preferred EGD. Discussed need for f/u  with GI.  Discussed with her today.  Desires no further intervention at this time. Continue PPI.  Follow.       GERD (gastroesophageal reflux disease)    Continue on protonix.        Hypercholesterolemia    Continue crestor.  Low cholesterol diet and exercise.  Follow lipid panel and liver function tests.   Lab Results  Component Value Date   CHOL 96 10/18/2021   HDL 45.80 10/18/2021   LDLCALC 40 10/18/2021   TRIG 53.0 10/18/2021   CHOLHDL 2 10/18/2021       Relevant Orders   Hepatic function panel   Lipid panel   Hypertension    Continue valsartan/hctz and amlodipine.  Continue current medications.  Follow pressures.  Follow metabolic panel.       Hypothyroidism    On synthroid.  Follow tsh.       Persistent cough - Primary    Persistent cough.  Continues on mucinex.  Albuterol inhaler as directed.  Continue to treat  reflux.  Check cxr.        Relevant Orders   DG Chest 2 View (Completed)   Type 1 diabetes mellitus with hyperglycemia ([place])    Followed by endocrinology.  Discussed low carb diet and exercise.  Follow met b and a1c. Seeing endocrinology.  Using SSI.  Adjusting.       Relevant Orders   Hemoglobin Q7K   Basic metabolic panel   Other Visit Diagnoses     Need for immunization against influenza       Relevant Orders   Flu Vaccine QUAD High Dose(Fluad) (Completed)        Einar Pheasant, MD

## 2021-10-30 ENCOUNTER — Encounter: Payer: Self-pay | Admitting: Internal Medicine

## 2021-10-30 MED ORDER — ALBUTEROL SULFATE HFA 108 (90 BASE) MCG/ACT IN AERS
2.0000 | INHALATION_SPRAY | Freq: Four times a day (QID) | RESPIRATORY_TRACT | 2 refills | Status: DC | PRN
Start: 2021-10-30 — End: 2022-04-22

## 2021-10-30 NOTE — Assessment & Plan Note (Signed)
Persistent cough.  Continues on mucinex.  Albuterol inhaler as directed.  Continue to treat reflux.  Check cxr.

## 2021-10-30 NOTE — Assessment & Plan Note (Signed)
Try to limit antiinflammatories.  Stay hydrated.  Follow metabolic panel.

## 2021-10-30 NOTE — Assessment & Plan Note (Signed)
Continue crestor.  Low cholesterol diet and exercise.  Follow lipid panel and liver function tests.   Lab Results  Component Value Date   CHOL 96 10/18/2021   HDL 45.80 10/18/2021   LDLCALC 40 10/18/2021   TRIG 53.0 10/18/2021   CHOLHDL 2 10/18/2021

## 2021-10-30 NOTE — Assessment & Plan Note (Signed)
Continue on protonix.   

## 2021-10-30 NOTE — Assessment & Plan Note (Signed)
On zoloft.  Stable.  Follow.  

## 2021-10-30 NOTE — Assessment & Plan Note (Signed)
History of gastric lymphoma. miralax has helped bowels.  Was previously referred back to GI for evaluation and question of need for EGD given history.  Saw GI 07/08/21 - ordered UGI with barium swallow.  Pt had previously preferred EGD. Discussed need for f/u with GI.  Discussed with her today.  Desires no further intervention at this time. Continue PPI.  Follow.

## 2021-10-30 NOTE — Assessment & Plan Note (Signed)
On synthroid.  Follow tsh.   

## 2021-10-30 NOTE — Assessment & Plan Note (Signed)
Continue valsartan/hctz and amlodipine.  Continue current medications.  Follow pressures.  Follow metabolic panel.  

## 2021-10-30 NOTE — Assessment & Plan Note (Signed)
Followed by endocrinology.  Discussed low carb diet and exercise.  Follow met b and a1c. Seeing endocrinology.  Using SSI.  Adjusting.

## 2021-10-30 NOTE — Assessment & Plan Note (Signed)
Recent fall.  Denies any residual problems.  Follow.

## 2021-10-30 NOTE — Assessment & Plan Note (Signed)
Has seen [facility].  Carotid duplex 22023  reveals velocities in the 1 to 39% range bilaterally.  No role for intervention at that level.  Continue annual surveillance and her current medical regimen. - continue crestor.

## 2021-11-01 ENCOUNTER — Telehealth: Payer: Self-pay | Admitting: Internal Medicine

## 2021-11-01 ENCOUNTER — Encounter (INDEPENDENT_AMBULATORY_CARE_PROVIDER_SITE_OTHER): Payer: Self-pay

## 2021-11-01 DIAGNOSIS — R9389 Abnormal findings on diagnostic imaging of other specified body structures: Secondary | ICD-10-CM

## 2021-11-01 NOTE — Telephone Encounter (Signed)
Order placed for HRCT

## 2021-11-01 NOTE — Telephone Encounter (Signed)
Patient returned office phone call. Note was read from Dr Nicki Reaper. Patient agrees on having a CT scan done.  Marissa Huff, [place]  11/01/2021 12:49 PM EDT   Attempted to reach pt on cell and home #'s - cell vm not set up Home vm full could not leave message  Einar Pheasant, MD  10/30/2021  7:37 PM EDT   Notify [name] that her cxr reveals no acute changes.  They did mention some coarsened lung markings (question of scarring).  I would like to obtain a high resolution chest CT to further evaluate if agreeable.  Let me know if agreeable and I will place the order for the referral.

## 2021-11-02 DIAGNOSIS — J301 Allergic rhinitis due to pollen: Secondary | ICD-10-CM | POA: Diagnosis not present

## 2021-11-04 ENCOUNTER — Other Ambulatory Visit: Payer: PPO

## 2021-11-09 ENCOUNTER — Ambulatory Visit
Admission: RE | Admit: 2021-11-09 | Discharge: 2021-11-09 | Disposition: A | Discharge status: Home / Self Care | Payer: PPO | Source: Ambulatory Visit | Attending: Internal Medicine | Admitting: Internal Medicine

## 2021-11-09 DIAGNOSIS — R918 Other nonspecific abnormal finding of lung field: Secondary | ICD-10-CM | POA: Diagnosis not present

## 2021-11-09 DIAGNOSIS — I700 Atherosclerosis of aorta: Secondary | ICD-10-CM | POA: Diagnosis not present

## 2021-11-09 DIAGNOSIS — R9389 Abnormal findings on diagnostic imaging of other specified body structures: Secondary | ICD-10-CM | POA: Insufficient documentation

## 2021-11-09 DIAGNOSIS — J8410 Pulmonary fibrosis, unspecified: Secondary | ICD-10-CM | POA: Diagnosis not present

## 2021-11-09 DIAGNOSIS — J984 Other disorders of lung: Secondary | ICD-10-CM | POA: Diagnosis not present

## 2021-11-10 ENCOUNTER — Ambulatory Visit: Payer: PPO | Admitting: Internal Medicine

## 2021-11-11 DIAGNOSIS — M25551 Pain in right hip: Secondary | ICD-10-CM | POA: Diagnosis not present

## 2021-11-11 DIAGNOSIS — M5441 Lumbago with sciatica, right side: Secondary | ICD-10-CM | POA: Diagnosis not present

## 2021-11-11 DIAGNOSIS — G8929 Other chronic pain: Secondary | ICD-10-CM | POA: Diagnosis not present

## 2021-11-11 DIAGNOSIS — M48062 Spinal stenosis, lumbar region with neurogenic claudication: Secondary | ICD-10-CM | POA: Diagnosis not present

## 2021-11-11 DIAGNOSIS — M5442 Lumbago with sciatica, left side: Secondary | ICD-10-CM | POA: Diagnosis not present

## 2021-11-12 ENCOUNTER — Telehealth: Payer: Self-pay | Admitting: Internal Medicine

## 2021-11-12 DIAGNOSIS — E1029 Type 1 diabetes mellitus with other diabetic kidney complication: Secondary | ICD-10-CM | POA: Diagnosis not present

## 2021-11-12 NOTE — Telephone Encounter (Signed)
Patient's husband called about the letter he received about patients lab results.

## 2021-11-12 NOTE — Telephone Encounter (Signed)
Lm for Juanda Crumble to cb

## 2021-11-16 DIAGNOSIS — J301 Allergic rhinitis due to pollen: Secondary | ICD-10-CM | POA: Diagnosis not present

## 2021-11-17 DIAGNOSIS — K219 Gastro-esophageal reflux disease without esophagitis: Secondary | ICD-10-CM | POA: Diagnosis not present

## 2021-11-17 DIAGNOSIS — R682 Dry mouth, unspecified: Secondary | ICD-10-CM | POA: Diagnosis not present

## 2021-11-17 DIAGNOSIS — R053 Chronic cough: Secondary | ICD-10-CM | POA: Diagnosis not present

## 2021-11-23 ENCOUNTER — Other Ambulatory Visit: Payer: Self-pay | Admitting: Internal Medicine

## 2021-11-23 DIAGNOSIS — K224 Dyskinesia of esophagus: Secondary | ICD-10-CM

## 2021-11-23 DIAGNOSIS — R9389 Abnormal findings on diagnostic imaging of other specified body structures: Secondary | ICD-10-CM

## 2021-11-23 NOTE — Progress Notes (Signed)
Order placed for echo.

## 2021-11-23 NOTE — Progress Notes (Signed)
Order placed for [facility]

## 2021-11-30 DIAGNOSIS — J301 Allergic rhinitis due to pollen: Secondary | ICD-10-CM | POA: Diagnosis not present

## 2021-12-03 ENCOUNTER — Other Ambulatory Visit: Payer: Self-pay | Admitting: Internal Medicine

## 2021-12-07 DIAGNOSIS — J301 Allergic rhinitis due to pollen: Secondary | ICD-10-CM | POA: Diagnosis not present

## 2021-12-09 ENCOUNTER — Ambulatory Visit: Payer: PPO | Attending: Internal Medicine

## 2021-12-09 DIAGNOSIS — R9389 Abnormal findings on diagnostic imaging of other specified body structures: Secondary | ICD-10-CM | POA: Diagnosis not present

## 2021-12-09 LAB — ECHOCARDIOGRAM COMPLETE
AR max vel: 2.28 cm2
AV Area VTI: 2.13 cm2
AV Area mean vel: 2.22 cm2
AV Mean grad: 3 mmHg
AV Peak grad: 4.8 mmHg
Ao pk vel: 1.09 m/s
Area-P 1/2: 3.45 cm2
Calc EF: 53.3 %
S' Lateral: 3.5 cm
Single Plane A2C EF: 51.5 %
Single Plane A4C EF: 54.7 %

## 2021-12-12 DIAGNOSIS — E1029 Type 1 diabetes mellitus with other diabetic kidney complication: Secondary | ICD-10-CM | POA: Diagnosis not present

## 2021-12-14 DIAGNOSIS — J301 Allergic rhinitis due to pollen: Secondary | ICD-10-CM | POA: Diagnosis not present

## 2022-01-11 DIAGNOSIS — J301 Allergic rhinitis due to pollen: Secondary | ICD-10-CM | POA: Diagnosis not present

## 2022-01-12 DIAGNOSIS — E1029 Type 1 diabetes mellitus with other diabetic kidney complication: Secondary | ICD-10-CM | POA: Diagnosis not present

## 2022-01-12 DIAGNOSIS — J301 Allergic rhinitis due to pollen: Secondary | ICD-10-CM | POA: Diagnosis not present

## 2022-01-27 ENCOUNTER — Telehealth: Payer: Self-pay | Admitting: Internal Medicine

## 2022-01-27 NOTE — Telephone Encounter (Signed)
Left message for patient to call back and schedule Medicare Annual Wellness Visit (AWV).   Please offer to do by telephone.  Left office number and my jabber [phone].  Last AWV:01/28/2021   Please schedule at anytime with Nurse Health Advisor.

## 2022-02-01 DIAGNOSIS — J301 Allergic rhinitis due to pollen: Secondary | ICD-10-CM | POA: Diagnosis not present

## 2022-02-12 DIAGNOSIS — E1029 Type 1 diabetes mellitus with other diabetic kidney complication: Secondary | ICD-10-CM | POA: Diagnosis not present

## 2022-02-15 ENCOUNTER — Ambulatory Visit (INDEPENDENT_AMBULATORY_CARE_PROVIDER_SITE_OTHER): Payer: PPO | Admitting: Vascular Surgery

## 2022-02-15 ENCOUNTER — Ambulatory Visit (INDEPENDENT_AMBULATORY_CARE_PROVIDER_SITE_OTHER): Payer: PPO

## 2022-02-15 ENCOUNTER — Encounter (INDEPENDENT_AMBULATORY_CARE_PROVIDER_SITE_OTHER): Payer: Self-pay | Admitting: Vascular Surgery

## 2022-02-15 ENCOUNTER — Other Ambulatory Visit (INDEPENDENT_AMBULATORY_CARE_PROVIDER_SITE_OTHER): Payer: Self-pay | Admitting: Vascular Surgery

## 2022-02-15 VITALS — BP 148/70 | HR 66 | Ht 63.0 in | Wt 180.0 lb

## 2022-02-15 DIAGNOSIS — I6523 Occlusion and stenosis of bilateral carotid arteries: Secondary | ICD-10-CM

## 2022-02-15 DIAGNOSIS — I10 Essential (primary) hypertension: Secondary | ICD-10-CM | POA: Diagnosis not present

## 2022-02-15 DIAGNOSIS — I779 Disorder of arteries and arterioles, unspecified: Secondary | ICD-10-CM | POA: Diagnosis not present

## 2022-02-15 DIAGNOSIS — E1069 Type 1 diabetes mellitus with other specified complication: Secondary | ICD-10-CM | POA: Diagnosis not present

## 2022-02-15 DIAGNOSIS — E785 Hyperlipidemia, unspecified: Secondary | ICD-10-CM

## 2022-02-15 NOTE — Assessment & Plan Note (Signed)
Carotid duplex today reveals some progression on the right side with common carotid artery stenosis that now appears to be 50% or greater and internal carotid artery stenosis that remains in the 1 to 39% range on the right.  The left internal carotid artery at the 1 to 39% range and stable from previous studies.  No role for intervention in an asymptomatic 89 year old.  Continue current medical regimen which includes Crestor and aspirin.  Recheck in 1 year.

## 2022-02-15 NOTE — Progress Notes (Signed)
MRN : [id number]  Marissa Huff is a 89 y.o. ([birth year]) female who presents with chief complaint of  Chief Complaint  Patient presents with   Follow-up    1 year carotid  .  History of Present Illness: Patient returns in follow up of her carotid disease.  She has lots of other issues especially orthopedic issues but no focal neurologic events.  Specifically, the patient denies amaurosis fugax, speech or swallowing difficulties, or arm or leg weakness or numbness.  Carotid duplex today reveals some progression on the right side with common carotid artery stenosis that now appears to be 50% or greater and internal carotid artery stenosis that remains in the 1 to 39% range on the right.  The left internal carotid artery at the 1 to 39% range and stable from previous studies.  Current Outpatient Medications  Medication Sig Dispense Refill   albuterol (VENTOLIN HFA) 108 (90 Base) MCG/ACT inhaler Inhale 2 puffs into the lungs every 6 (six) hours as needed for wheezing or shortness of breath. 1 each 2   amLODipine (NORVASC) 5 MG tablet TAKE 1 TABLET (5 MG TOTAL) BY MOUTH DAILY. 90 tablet 1   aspirin 81 MG tablet Take 81 mg by mouth daily.     BD PEN NEEDLE NANO U/F 32G X 4 MM MISC USE 1 EACH 4 (FOUR) TIMES DAILY     Calcium Citrate-Vitamin D (CALCIUM + D PO) Take 1 tablet by mouth daily.     celecoxib (CELEBREX) 200 MG capsule Take by mouth 2 (two) times daily.     Continuous Blood Gluc Receiver (FREESTYLE LIBRE 14 DAY READER) DEVI Use 1 each as needed .  Use to test blood sugar     Continuous Blood Gluc Sensor (FREESTYLE LIBRE 14 DAY SENSOR) MISC Use 1 each every 14 (fourteen) days     fexofenadine (ALLEGRA) 180 MG tablet Take 180 mg by mouth daily.     fluticasone (FLONASE) 50 MCG/ACT nasal spray SPRAY 2 SPRAYS INTO EACH NOSTRIL EVERY DAY 48 mL 3   glucagon (GLUCAGON EMERGENCY) 1 MG injection Inject 1 mg into the muscle once as needed. 1 each 3   glucose blood test strip      insulin  aspart (NOVOLOG FLEXPEN) 100 UNIT/ML FlexPen Inject 10 Units into the skin in the morning, at noon, and at bedtime.     LANTUS SOLOSTAR 100 UNIT/ML Solostar Pen Inject 28-30 Units into the skin at bedtime.  4   levothyroxine (SYNTHROID) 200 MCG tablet Take 1 tablet (200 mcg total) by mouth daily before breakfast. 90 tablet 2   Multiple Vitamins-Minerals (MULTIVITAMIN PO) Take 1 tablet by mouth daily.      Multiple Vitamins-Minerals (PRESERVISION AREDS PO) Take 1 capsule by mouth 2 (two) times daily.     pantoprazole (PROTONIX) 40 MG tablet Take 1 tablet (40 mg total) by mouth 2 (two) times daily. 180 tablet 1   rosuvastatin (CRESTOR) 5 MG tablet TAKE 1 TABLET BY MOUTH THREE TIMES A WEEK. 40 tablet 1   sertraline (ZOLOFT) 25 MG tablet TAKE 1 TABLET (25 MG TOTAL) BY MOUTH DAILY. 90 tablet 1   traMADol (ULTRAM) 50 MG tablet Take 50 mg by mouth every 6 (six) hours as needed.     valsartan-hydrochlorothiazide (DIOVAN-HCT) 160-12.5 MG tablet Take 1 tablet by mouth daily. 90 tablet 1   No current facility-administered medications for this visit.    Past Medical History:  Diagnosis Date   Allergy    Arthritis  Carpal tunnel syndrome, bilateral    Diabetes mellitus without complication (HCC)    Diverticulosis    Gastric lymphoma (HCC)    Gastroparesis    GERD (gastroesophageal reflux disease)    Hypertension    Hypothyroidism    PUD (peptic ulcer disease)    Skin cancer    Spinal stenosis    lumbar laminectomy    Past Surgical History:  Procedure Laterality Date   ABDOMINAL HYSTERECTOMY     antrectomy and vagotomy  1994   arthroscopic left knee  12/05/08   BACK SURGERY     carpal tunnel repai  10/10   left    CATARACT EXTRACTION, BILATERAL     EYE SURGERY     bilateral cataracts   gastric lymphoma  1996   surgery   LAPAROSCOPIC CHOLECYSTECTOMY  6/01   LUMBAR LAMINECTOMY  1999   LUMBAR LAMINECTOMY WITH COFLEX 1 LEVEL N/A 09/15/2015   Procedure: Lumbar one-two Laminectomy with  placement of coflex;  Surgeon: Kristeen Miss, MD;  Location: [facility];  Service: Neurosurgery;  Laterality: N/A;   PARTIAL HYSTERECTOMY  1960   fibroid tumors, ovaries not removed   tonsillectomy     TONSILLECTOMY       Social History   Tobacco Use   Smoking status: Never   Smokeless tobacco: Never  Vaping Use   Vaping Use: Never used  Substance Use Topics   Alcohol use: No    Alcohol/week: 0.0 standard drinks of alcohol   Drug use: No       Family History  Problem Relation Age of Onset   Stroke Mother    Stroke Father    Hypertension Father    Diabetes Brother    Multiple sclerosis Brother    Breast cancer Daughter 58   Colon cancer Neg Hx      Allergies  Allergen Reactions   Penicillins Rash     REVIEW OF SYSTEMS (Negative unless checked)  Constitutional: []$ Weight loss  []$ Fever  []$ Chills Cardiac: []$ Chest pain   []$ Chest pressure   []$ Palpitations   []$ Shortness of breath when laying flat   []$ Shortness of breath at rest   []$ Shortness of breath with exertion. Vascular:  []$ Pain in legs with walking   []$ Pain in legs at rest   []$ Pain in legs when laying flat   []$ Claudication   []$ Pain in feet when walking  []$ Pain in feet at rest  []$ Pain in feet when laying flat   []$ History of DVT   []$ Phlebitis   []$ Swelling in legs   []$ Varicose veins   []$ Non-healing ulcers Pulmonary:   []$ Uses home oxygen   []$ Productive cough   []$ Hemoptysis   []$ Wheeze  []$ COPD   []$ Asthma Neurologic:  []$ Dizziness  []$ Blackouts   []$ Seizures   []$ History of stroke   []$ History of TIA  []$ Aphasia   []$ Temporary blindness   []$ Dysphagia   []$ Weakness or numbness in arms   []$ Weakness or numbness in legs Musculoskeletal:  [x]$ Arthritis   []$ Joint swelling   []$ Joint pain   [x]$ Low back pain Hematologic:  []$ Easy bruising  []$ Easy bleeding   []$ Hypercoagulable state   []$ Anemic  []$ Hepatitis Gastrointestinal:  []$ Blood in stool   []$ Vomiting blood  [x]$ Gastroesophageal reflux/heartburn   []$ Difficulty swallowing. Genitourinary:   [x]$ Chronic kidney disease   []$ Difficult urination  []$ Frequent urination  []$ Burning with urination   []$ Blood in urine Skin:  []$ Rashes   []$ Ulcers   []$ Wounds Psychological:  []$ History of anxiety   []$  History of major depression.  Physical Examination  Vitals:  02/15/22 1434  BP: (!) 148/70  Pulse: 66  Weight: 180 lb (81.6 kg)  Height: 5' 3"$  (1.6 m)   Body mass index is 31.89 kg/m. Gen:  WD/WN, NAD Head: [place]/AT, No temporalis wasting. Ear/Nose/Throat: Hearing grossly intact, nares w/o erythema or drainage, trachea midline Eyes: Conjunctiva clear. Sclera non-icteric Neck: Supple.  Soft right carotid bruit  Pulmonary:  Good air movement, equal and clear to auscultation bilaterally.  Cardiac: RRR, No JVD Vascular:  Vessel Right Left  Radial Palpable Palpable           Musculoskeletal: M/S 5/5 throughout.  No deformity or atrophy. No edema. Neurologic: CN 2-12 intact. Sensation grossly intact in extremities.  Symmetrical.  Speech is fluent. Motor exam as listed above. Psychiatric: Judgment intact, Mood & affect appropriate for pt's clinical situation. Dermatologic: No rashes or ulcers noted.  No cellulitis or open wounds.   CBC Lab Results  Component Value Date   WBC 4.9 03/04/2021   HGB 12.0 03/04/2021   HCT 35.8 (L) 03/04/2021   MCV 93.1 03/04/2021   PLT 203.0 03/04/2021    BMET    Component Value Date/Time   NA 141 10/18/2021 0942   K 4.1 10/18/2021 0942   CL 103 10/18/2021 0942   CO2 28 10/18/2021 0942   GLUCOSE 81 10/18/2021 0942   BUN 22 10/18/2021 0942   CREATININE 0.84 10/18/2021 0942   CREATININE 0.94 03/26/2012 0920   CALCIUM 9.1 10/18/2021 0942   GFRNONAA >60 09/09/2015 1422   GFRNONAA 58 (L) 03/26/2012 0920   GFRAA >60 09/09/2015 1422   GFRAA >60 03/26/2012 0920   CrCl cannot be calculated (Patient's most recent lab result is older than the maximum 21 days allowed.).  COAG No results found for: "INR", "PROTIME"  Radiology No results  found.   Assessment/Plan Carotid artery disease, unspecified laterality ([place]) Carotid duplex today reveals some progression on the right side with common carotid artery stenosis that now appears to be 50% or greater and internal carotid artery stenosis that remains in the 1 to 39% range on the right.  The left internal carotid artery at the 1 to 39% range and stable from previous studies.  No role for intervention in an asymptomatic 89 year old.  Continue current medical regimen which includes Crestor and aspirin.  Recheck in 1 year.  Hypercholesterolemia lipid control important in reducing the progression of atherosclerotic disease. Continue statin therapy.     Hypertension blood pressure control important in reducing the progression of atherosclerotic disease. On appropriate oral medications.   Type II diabetes mellitus (HCC) blood glucose control important in reducing the progression of atherosclerotic disease. Also, involved in wound healing. On appropriate medications.     Leotis Pain, MD  02/15/2022 4:28 PM    This note was created with Dragon medical transcription system.  Any errors from dictation are purely unintentional

## 2022-02-18 DIAGNOSIS — E785 Hyperlipidemia, unspecified: Secondary | ICD-10-CM | POA: Diagnosis not present

## 2022-02-18 DIAGNOSIS — E1029 Type 1 diabetes mellitus with other diabetic kidney complication: Secondary | ICD-10-CM | POA: Diagnosis not present

## 2022-02-18 DIAGNOSIS — E1069 Type 1 diabetes mellitus with other specified complication: Secondary | ICD-10-CM | POA: Diagnosis not present

## 2022-02-18 DIAGNOSIS — E039 Hypothyroidism, unspecified: Secondary | ICD-10-CM | POA: Diagnosis not present

## 2022-02-18 DIAGNOSIS — I152 Hypertension secondary to endocrine disorders: Secondary | ICD-10-CM | POA: Diagnosis not present

## 2022-02-18 DIAGNOSIS — R809 Proteinuria, unspecified: Secondary | ICD-10-CM | POA: Diagnosis not present

## 2022-02-22 DIAGNOSIS — J301 Allergic rhinitis due to pollen: Secondary | ICD-10-CM | POA: Diagnosis not present

## 2022-02-25 ENCOUNTER — Other Ambulatory Visit (INDEPENDENT_AMBULATORY_CARE_PROVIDER_SITE_OTHER): Payer: PPO

## 2022-02-25 DIAGNOSIS — E1065 Type 1 diabetes mellitus with hyperglycemia: Secondary | ICD-10-CM

## 2022-02-25 DIAGNOSIS — E7800 Pure hypercholesterolemia, unspecified: Secondary | ICD-10-CM

## 2022-02-25 LAB — BASIC METABOLIC PANEL
BUN: 23 mg/dL (ref 6–23)
CO2: 28 mEq/L (ref 19–32)
Calcium: 9.6 mg/dL (ref 8.4–10.5)
Chloride: 105 mEq/L (ref 96–112)
Creatinine, Ser: 0.74 mg/dL (ref 0.40–1.20)
GFR: 71.96 mL/min (ref >60.00)
Glucose, Bld: 210 mg/dL — ABNORMAL HIGH (ref 70–99)
Potassium: 4 mEq/L (ref 3.5–5.1)
Sodium: 141 mEq/L (ref 135–145)

## 2022-02-25 LAB — HEPATIC FUNCTION PANEL
ALT: 17 U/L (ref 0–35)
AST: 17 U/L (ref 0–37)
Albumin: 4.1 g/dL (ref 3.5–5.2)
Alkaline Phosphatase: 112 U/L (ref 39–117)
Bilirubin, Direct: 0.1 mg/dL (ref 0.0–0.3)
Total Bilirubin: 0.5 mg/dL (ref 0.2–1.2)
Total Protein: 6.5 g/dL (ref 6.0–8.3)

## 2022-02-25 LAB — LIPID PANEL
Cholesterol: 114 mg/dL (ref 0–200)
HDL: 52.2 mg/dL (ref >39.00)
LDL Cholesterol: 50 mg/dL (ref 0–99)
NonHDL: 62.14
Total CHOL/HDL Ratio: 2
Triglycerides: 59 mg/dL (ref 0.0–149.0)
VLDL: 11.8 mg/dL (ref 0.0–40.0)

## 2022-02-25 LAB — HEMOGLOBIN A1C: Hgb A1c MFr Bld: 7.6 % — ABNORMAL HIGH (ref 4.6–6.5)

## 2022-02-25 NOTE — Progress Notes (Deleted)
02/28/2022 9:59 AM   Marissa Huff 1931/11/11 [id number]  Referring provider: Einar Pheasant, [id number] [place],  [place] [address]  Urological history: 1. UPJ obstruction -Serum creatinine pending *** -CT abdomen/pelvis in 04/2018 visualized  right hydronephrosis with 10/12 mm renal calculi  -Lasix renogram on 06/2018 obstruction present with differential function 39% right kidney  -RUS (03/2021) RIGHT greater than LEFT hydronephrosis, similar in comparison to prior CT from 2020 when accounting for differences in technique  2.  Nephrolithiasis -CT (05/2018) 10 mm calculus in the right renal pelvis and a 12 mm stone in the right midpole  No chief complaint on file.   HPI: Marissa Huff is a 89 y.o. female who presents today for yearly follow up.        PMH: 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HCC)    Gastroparesis    GERD (gastroesophageal reflux disease)    Hypertension    Hypothyroidism    PUD (peptic ulcer disease)    Skin cancer    Spinal stenosis    lumbar laminectomy    Surgical History: Past Surgical History:  Procedure Laterality Date   ABDOMINAL HYSTERECTOMY     antrectomy and vagotomy  1994   arthroscopic left knee  12/05/08   BACK SURGERY     carpal tunnel repai  10/10   left    CATARACT EXTRACTION, BILATERAL     EYE SURGERY     bilateral cataracts   gastric lymphoma  1996   surgery   LAPAROSCOPIC CHOLECYSTECTOMY  6/01   LUMBAR LAMINECTOMY  1999   LUMBAR LAMINECTOMY WITH COFLEX 1 LEVEL N/A 09/15/2015   Procedure: Lumbar one-two Laminectomy with placement of coflex;  Surgeon: Kristeen Miss, MD;  Location: [facility];  Service: Neurosurgery;  Laterality: N/A;   PARTIAL HYSTERECTOMY  1960   fibroid tumors, ovaries not removed   tonsillectomy     TONSILLECTOMY       Home Medications:  Allergies as of 02/28/2022       Reactions   Penicillins Rash        Medication List        Accurate as of February 25, 2022  9:59 AM. If you have any questions, ask your nurse or doctor.          albuterol 108 (90 Base) MCG/ACT inhaler Commonly known as: VENTOLIN HFA Inhale 2 puffs into the lungs every 6 (six) hours as needed for wheezing or shortness of breath.   amLODipine 5 MG tablet Commonly known as: NORVASC TAKE 1 TABLET (5 MG TOTAL) BY MOUTH DAILY.   aspirin 81 MG tablet Take 81 mg by mouth daily.   BD Pen Needle Nano U/F 32G X 4 MM Misc Generic drug: Insulin Pen Needle USE 1 EACH 4 (FOUR) TIMES DAILY   CALCIUM + D PO Take 1 tablet by mouth daily.   celecoxib 200 MG capsule Commonly known as: CELEBREX Take by mouth 2 (two) times daily.   fexofenadine 180 MG tablet Commonly known as: ALLEGRA Take 180 mg by mouth daily.   fluticasone 50 MCG/ACT nasal spray Commonly known as: FLONASE SPRAY 2 SPRAYS INTO EACH NOSTRIL EVERY DAY   FreeStyle Libre 14 Day Reader [facility] Use 1 each as needed .  Use to test blood sugar  FreeStyle Libre 14 Day Sensor Misc Use 1 each every 14 (fourteen) days   glucagon 1 MG injection Inject 1 mg into the muscle once as needed.   glucose blood test strip   Lantus SoloStar 100 UNIT/ML Solostar Pen Generic drug: insulin glargine Inject 28-30 Units into the skin at bedtime.   levothyroxine 200 MCG tablet Commonly known as: SYNTHROID Take 1 tablet (200 mcg total) by mouth daily before breakfast.   MULTIVITAMIN PO Take 1 tablet by mouth daily.   NovoLOG FlexPen 100 UNIT/ML FlexPen Generic drug: insulin aspart Inject 10 Units into the skin in the morning, at noon, and at bedtime.   pantoprazole 40 MG tablet Commonly known as: PROTONIX Take 1 tablet (40 mg total) by mouth 2 (two) times daily.   PRESERVISION AREDS PO Take 1 capsule by mouth 2 (two) times daily.   rosuvastatin 5 MG  tablet Commonly known as: CRESTOR TAKE 1 TABLET BY MOUTH THREE TIMES A WEEK.   sertraline 25 MG tablet Commonly known as: ZOLOFT TAKE 1 TABLET (25 MG TOTAL) BY MOUTH DAILY.   traMADol 50 MG tablet Commonly known as: ULTRAM Take 50 mg by mouth every 6 (six) hours as needed.   valsartan-hydrochlorothiazide 160-12.5 MG tablet Commonly known as: DIOVAN-HCT Take 1 tablet by mouth daily.        Allergies:  Allergies  Allergen Reactions   Penicillins Rash    Family History: Family History  Problem Relation Age of Onset   Stroke Mother    Stroke Father    Hypertension Father    Diabetes Brother    Multiple sclerosis Brother    Breast cancer Daughter 78   Colon cancer Neg Hx     Social History:  reports that she has never smoked. She has never used smokeless tobacco. She reports that she does not drink alcohol and does not use drugs.  ROS: Pertinent ROS in HPI  Physical Exam: There were no vitals taken for this visit.  Constitutional:  Well nourished. Alert and oriented, No acute distress. HEENT:  AT, moist mucus membranes.  Trachea midline, no masses. Cardiovascular: No clubbing, cyanosis, or edema. Respiratory: Normal respiratory effort, no increased work of breathing. GU: No CVA tenderness.  No bladder fullness or masses. Vulvovaginal atrophy w/ pallor, loss of rugae, introital retraction, excoriations.  Vulvar thinning, fusion of labia, clitoral hood retraction, prominent urethral meatus.   *** external genitalia, *** pubic hair distribution, no lesions.  Normal urethral meatus, no lesions, no prolapse, no discharge.   No urethral masses, tenderness and/or tenderness. No bladder fullness, tenderness or masses. *** vagina mucosa, *** estrogen effect, no discharge, no lesions, *** pelvic support, *** cystocele and *** rectocele noted.  No cervical motion tenderness.  Uterus is freely mobile and non-fixed.  No adnexal/parametria masses or tenderness noted.  Anus and  perineum are without rashes or lesions.   ***  Neurologic: Grossly intact, no focal deficits, moving all 4 extremities. Psychiatric: Normal mood and affect.    Laboratory Data: Lab Results  Component Value Date   WBC 4.9 03/04/2021   HGB 12.0 03/04/2021   HCT 35.8 (L) 03/04/2021   MCV 93.1 03/04/2021   PLT 203.0 03/04/2021    Lab Results  Component Value Date   CREATININE 0.84 10/18/2021    Lab Results  Component Value Date   HGBA1C 7.0 (H) 10/18/2021    Lab Results  Component Value Date   TSH 5.18 10/18/2021       Component Value Date/Time  CHOL 96 10/18/2021 0942   HDL 45.80 10/18/2021 0942   CHOLHDL 2 10/18/2021 0942   VLDL 10.6 10/18/2021 0942   LDLCALC 40 10/18/2021 0942    Lab Results  Component Value Date   AST 15 10/18/2021   Lab Results  Component Value Date   ALT 15 10/18/2021    Urinalysis    Component Value Date/Time   COLORURINE YELLOW 02/26/2021 1316   APPEARANCEUR CLOUDY (A) 02/26/2021 1316   LABSPEC 1.025 02/26/2021 1316   PHURINE 5.0 02/26/2021 1316   GLUCOSEU NEGATIVE 02/26/2021 1316   HGBUR TRACE (A) 02/26/2021 1316   BILIRUBINUR NEGATIVE 02/26/2021 1316   BILIRUBINUR negative 08/22/2016 0958   KETONESUR TRACE (A) 02/26/2021 1316   PROTEINUR TRACE (A) 02/26/2021 1316   UROBILINOGEN 0.2 08/22/2016 0958   NITRITE POSITIVE (A) 02/26/2021 1316   LEUKOCYTESUR SMALL (A) 02/26/2021 1316  I have reviewed the labs.   Pertinent Imaging: N/A  Assessment & Plan:  ***  1. Right UPJ obstruction ***  2. Nephrolithiasis *** No follow-ups on file.  These notes generated with voice recognition software. I apologize for typographical errors.  [place], [place] [address]  [place], [place] [address] [phone]

## 2022-02-28 ENCOUNTER — Ambulatory Visit: Payer: PPO | Admitting: Urology

## 2022-02-28 ENCOUNTER — Other Ambulatory Visit: Payer: Self-pay | Admitting: Family Medicine

## 2022-02-28 DIAGNOSIS — Q6211 Congenital occlusion of ureteropelvic junction: Secondary | ICD-10-CM

## 2022-02-28 DIAGNOSIS — N390 Urinary tract infection, site not specified: Secondary | ICD-10-CM

## 2022-02-28 DIAGNOSIS — N200 Calculus of kidney: Secondary | ICD-10-CM

## 2022-03-02 ENCOUNTER — Encounter: Payer: Self-pay | Admitting: Internal Medicine

## 2022-03-02 ENCOUNTER — Ambulatory Visit (INDEPENDENT_AMBULATORY_CARE_PROVIDER_SITE_OTHER): Payer: PPO | Admitting: Internal Medicine

## 2022-03-02 VITALS — BP 128/72 | HR 83 | Temp 97.8°F | Resp 16 | Ht 63.0 in | Wt 180.0 lb

## 2022-03-02 DIAGNOSIS — I779 Disorder of arteries and arterioles, unspecified: Secondary | ICD-10-CM | POA: Diagnosis not present

## 2022-03-02 DIAGNOSIS — K3184 Gastroparesis: Secondary | ICD-10-CM

## 2022-03-02 DIAGNOSIS — Z0000 Encounter for general adult medical examination without abnormal findings: Secondary | ICD-10-CM | POA: Diagnosis not present

## 2022-03-02 DIAGNOSIS — E7800 Pure hypercholesterolemia, unspecified: Secondary | ICD-10-CM | POA: Diagnosis not present

## 2022-03-02 DIAGNOSIS — E038 Other specified hypothyroidism: Secondary | ICD-10-CM | POA: Diagnosis not present

## 2022-03-02 DIAGNOSIS — F419 Anxiety disorder, unspecified: Secondary | ICD-10-CM | POA: Diagnosis not present

## 2022-03-02 DIAGNOSIS — M4800 Spinal stenosis, site unspecified: Secondary | ICD-10-CM | POA: Diagnosis not present

## 2022-03-02 DIAGNOSIS — C8599 Non-Hodgkin lymphoma, unspecified, extranodal and solid organ sites: Secondary | ICD-10-CM | POA: Diagnosis not present

## 2022-03-02 DIAGNOSIS — E1065 Type 1 diabetes mellitus with hyperglycemia: Secondary | ICD-10-CM

## 2022-03-02 DIAGNOSIS — Q6211 Congenital occlusion of ureteropelvic junction: Secondary | ICD-10-CM | POA: Diagnosis not present

## 2022-03-02 DIAGNOSIS — K219 Gastro-esophageal reflux disease without esophagitis: Secondary | ICD-10-CM | POA: Diagnosis not present

## 2022-03-02 DIAGNOSIS — I10 Essential (primary) hypertension: Secondary | ICD-10-CM | POA: Diagnosis not present

## 2022-03-02 DIAGNOSIS — R413 Other amnesia: Secondary | ICD-10-CM

## 2022-03-02 DIAGNOSIS — D649 Anemia, unspecified: Secondary | ICD-10-CM

## 2022-03-02 LAB — VITAMIN B12: Vitamin B-12: 506 pg/mL (ref 211–911)

## 2022-03-02 LAB — TSH: TSH: 0.92 u[IU]/mL (ref 0.35–5.50)

## 2022-03-02 MED ORDER — SERTRALINE HCL 25 MG PO TABS: 25.0000 mg | ORAL_TABLET | Freq: Every day | ORAL | 2 refills | Status: DC

## 2022-03-02 NOTE — Assessment & Plan Note (Signed)
Physical today 03/02/22.  Mammogram 08/05/21- Birads I. Bone density.  03/31/21 - osteopenia.

## 2022-03-02 NOTE — Patient Instructions (Signed)
It is time for your Annual Wellness Visit, please schedule this at check out.

## 2022-03-02 NOTE — Progress Notes (Signed)
Subjective:    Patient ID: VENESA TOLLEFSEN, female    DOB: 30-Jul-1931, 89 y.o.   MRN: [id number]  Patient here for  Chief Complaint  Patient presents with   Annual Exam    HPI Here for physical exam.  She is accompanied by her husband.  History obtained from both of them. Sees Dr Honor Junes for f/u type 1 diabetes.  Evaluated 02/18/22 - lantus increased to 26 units.  Saw Dr Lucky Cowboy 02/15/22.  Carotid duplex today reveals some progression on the right side with common carotid artery stenosis that now appears to be 50% or greater and internal carotid artery stenosis that remains in the 1 to 39% range on the right. The left internal carotid artery at the 1 to 39% range and stable from previous studies. Recommended to continue current medication regimen and recheck in one year. Saw Dr Alba Destine - f/u back pain.  Prescribed tramadol.  Still with increased pain.  Is interested in f/u with Dr Sharlet Salina - to discuss further treatment given persistent increased pain.  Taking tylenol arthritis and using lidocaine.  Also discussed GI referral. Abnormal CT - esophagus - dilated and contained fluid - question of dysmotility.  She had declined previously.  Discussed today.  She is agreeable.  Discussed referral to [facility].  Also concern regarding memory.  Increased stress - discussed could be affecting thinking and memory.  Discussed zoloft.  Also discussed neurology referral.     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[place])    Gastroparesis    GERD (gastroesophageal reflux disease)    Hypertension    Hypothyroidism    PUD (peptic ulcer disease)    Skin cancer    Spinal stenosis    lumbar laminectomy   Past Surgical History:  Procedure Laterality Date   ABDOMINAL HYSTERECTOMY     antrectomy and vagotomy  1994   arthroscopic left knee  12/05/08   BACK SURGERY      carpal tunnel repai  10/10   left    CATARACT EXTRACTION, BILATERAL     EYE SURGERY     bilateral cataracts   gastric lymphoma  1996   surgery   LAPAROSCOPIC CHOLECYSTECTOMY  6/01   LUMBAR LAMINECTOMY  1999   LUMBAR LAMINECTOMY WITH COFLEX 1 LEVEL N/A 09/15/2015   Procedure: Lumbar one-two Laminectomy with placement of coflex;  Surgeon: Kristeen Miss, MD;  Location: [facility];  Service: Neurosurgery;  Laterality: N/A;   PARTIAL HYSTERECTOMY  1960   fibroid tumors, ovaries not removed   tonsillectomy     TONSILLECTOMY     Family History  Problem Relation Age of Onset   Stroke Mother    Stroke Father    Hypertension Father    Diabetes Brother    Multiple sclerosis Brother    Breast cancer Daughter 30   Colon cancer Neg Hx    Social History   Socioeconomic History   Marital status: Married    Spouse name: Not on file   Number of children: Not on file   Years of education: Not on file   Highest education level: Not on file  Occupational History   Not on file  Tobacco Use   Smoking status: Never   Smokeless tobacco: Never  Vaping Use   Vaping Use: Never used  Substance  and Sexual Activity   Alcohol use: No    Alcohol/week: 0.0 standard drinks of alcohol   Drug use: No   Sexual activity: Not Currently    Birth control/protection: None, Post-menopausal  Other Topics Concern   Not on file  Social History Narrative   Not on file   Social Determinants of Health   Financial Resource Strain: Low Risk  (01/28/2021)   Overall Financial Resource Strain (CARDIA)    Difficulty of Paying Living Expenses: Not hard at all  Food Insecurity: No Food Insecurity (01/28/2021)   Hunger Vital Sign    Worried About Running Out of Food in the Last Year: Never true    Ran Out of Food in the Last Year: Never true  Transportation Needs: No Transportation Needs (01/28/2021)   PRAPARE - Hydrologist (Medical): No    Lack of Transportation (Non-Medical): No   Physical Activity: Unknown (01/19/2018)   Exercise Vital Sign    Days of Exercise per Week: 0 days    Minutes of Exercise per Session: Not on file  Stress: No Stress Concern Present (01/28/2021)   [place]    Feeling of Stress : Not at all  Social Connections: [place] (01/28/2021)   Social Connection and Isolation Panel [NHANES]    Frequency of Communication with Friends and Family: More than three times a week    Frequency of Social Gatherings with Friends and Family: More than three times a week    Attends Religious Services: More than 4 times per year    Active Member of [employer] or Organizations: Yes    Attends Archivist Meetings: Not on file    Marital Status: Married     Review of Systems  Constitutional:  Negative for appetite change and unexpected weight change.  HENT:  Negative for congestion and sinus pressure.   Respiratory:  Negative for cough and chest tightness.        Breathing stable.   Cardiovascular:  Negative for chest pain and palpitations.  Gastrointestinal:  Negative for abdominal pain, diarrhea, nausea and vomiting.  Genitourinary:  Negative for difficulty urinating and dysuria.  Musculoskeletal:  Positive for back pain.       Left knee and groin pain.   Skin:  Negative for color change and rash.  Neurological:  Negative for dizziness and headaches.  Psychiatric/Behavioral:  Negative for agitation and dysphoric mood.        Objective:     BP 128/72   Pulse 83   Temp 97.8 F (36.6 C)   Resp 16   Ht '5\' 3"'$  (1.6 m)   Wt 180 lb (81.6 kg)   SpO2 98%   BMI 31.89 kg/m  Wt Readings from Last 3 Encounters:  03/02/22 180 lb (81.6 kg)  02/15/22 180 lb (81.6 kg)  10/28/21 180 lb 9.6 oz (81.9 kg)    Physical Exam Vitals reviewed.  Constitutional:      General: She is not in acute distress.    Appearance: Normal appearance.  HENT:     Head: Normocephalic and  atraumatic.     Right Ear: External ear normal.     Left Ear: External ear normal.  Eyes:     General: No scleral icterus.       Right eye: No discharge.        Left eye: No discharge.     Conjunctiva/sclera: Conjunctivae normal.  Neck:  Thyroid: No thyromegaly.  Cardiovascular:     Rate and Rhythm: Normal rate and regular rhythm.  Pulmonary:     Effort: No respiratory distress.     Breath sounds: Normal breath sounds. No wheezing.  Abdominal:     General: Bowel sounds are normal.     Palpations: Abdomen is soft.     Tenderness: There is no abdominal tenderness.  Musculoskeletal:        General: No swelling or tenderness.     Cervical back: Neck supple. No tenderness.  Lymphadenopathy:     Cervical: No cervical adenopathy.  Skin:    Findings: No erythema or rash.  Neurological:     Mental Status: She is alert.  Psychiatric:        Mood and Affect: Mood normal.        Behavior: Behavior normal.      Outpatient Encounter Medications as of 03/02/2022  Medication Sig   sertraline (ZOLOFT) 25 MG tablet Take 1 tablet (25 mg total) by mouth daily.   albuterol (VENTOLIN HFA) 108 (90 Base) MCG/ACT inhaler Inhale 2 puffs into the lungs every 6 (six) hours as needed for wheezing or shortness of breath.   amLODipine (NORVASC) 5 MG tablet TAKE 1 TABLET (5 MG TOTAL) BY MOUTH DAILY.   aspirin 81 MG tablet Take 81 mg by mouth daily.   BD PEN NEEDLE NANO U/F 32G X 4 MM MISC USE 1 EACH 4 (FOUR) TIMES DAILY   Calcium Citrate-Vitamin D (CALCIUM + D PO) Take 1 tablet by mouth daily.   Continuous Blood Gluc Receiver (FREESTYLE LIBRE 14 DAY READER) DEVI Use 1 each as needed .  Use to test blood sugar   Continuous Blood Gluc Sensor (FREESTYLE LIBRE 14 DAY SENSOR) MISC Use 1 each every 14 (fourteen) days   fexofenadine (ALLEGRA) 180 MG tablet Take 180 mg by mouth daily.   fluticasone (FLONASE) 50 MCG/ACT nasal spray SPRAY 2 SPRAYS INTO EACH NOSTRIL EVERY DAY   glucagon (GLUCAGON EMERGENCY) 1  MG injection Inject 1 mg into the muscle once as needed.   glucose blood test strip    insulin aspart (NOVOLOG FLEXPEN) 100 UNIT/ML FlexPen Inject 10 Units into the skin in the morning, at noon, and at bedtime.   LANTUS SOLOSTAR 100 UNIT/ML Solostar Pen Inject 28-30 Units into the skin at bedtime.   levothyroxine (SYNTHROID) 200 MCG tablet Take 1 tablet (200 mcg total) by mouth daily before breakfast.   Multiple Vitamins-Minerals (MULTIVITAMIN PO) Take 1 tablet by mouth daily.    Multiple Vitamins-Minerals (PRESERVISION AREDS PO) Take 1 capsule by mouth 2 (two) times daily.   pantoprazole (PROTONIX) 40 MG tablet Take 1 tablet (40 mg total) by mouth 2 (two) times daily.   rosuvastatin (CRESTOR) 5 MG tablet TAKE 1 TABLET BY MOUTH THREE TIMES A WEEK.   valsartan-hydrochlorothiazide (DIOVAN-HCT) 160-12.5 MG tablet Take 1 tablet by mouth daily.   [DISCONTINUED] celecoxib (CELEBREX) 200 MG capsule Take by mouth 2 (two) times daily.   [DISCONTINUED] sertraline (ZOLOFT) 25 MG tablet TAKE 1 TABLET (25 MG TOTAL) BY MOUTH DAILY.   [DISCONTINUED] traMADol (ULTRAM) 50 MG tablet Take 50 mg by mouth every 6 (six) hours as needed.   No facility-administered encounter medications on file as of 03/02/2022.     Lab Results  Component Value Date   WBC 4.9 03/04/2021   HGB 12.0 03/04/2021   HCT 35.8 (L) 03/04/2021   PLT 203.0 03/04/2021   GLUCOSE 210 (H) 02/25/2022   CHOL 114  02/25/2022   TRIG 59.0 02/25/2022   HDL 52.20 02/25/2022   LDLCALC 50 02/25/2022   ALT 17 02/25/2022   AST 17 02/25/2022   NA 141 02/25/2022   K 4.0 02/25/2022   CL 105 02/25/2022   CREATININE 0.74 02/25/2022   BUN 23 02/25/2022   CO2 28 02/25/2022   TSH 0.92 03/02/2022   HGBA1C 7.6 (H) 02/25/2022   MICROALBUR 2.3 (H) 07/16/2021    CT Chest High Resolution  Result Date: 11/12/2021 CLINICAL DATA:  Abnormal chest radiograph. EXAM: CT CHEST WITHOUT CONTRAST TECHNIQUE: Multidetector CT imaging of the chest was performed  following the standard protocol without intravenous contrast. High resolution imaging of the lungs, as well as inspiratory and expiratory imaging, was performed. RADIATION DOSE REDUCTION: This exam was performed according to the departmental dose-optimization program which includes automated exposure control, adjustment of the mA and/or kV according to patient size and/or use of iterative reconstruction technique. COMPARISON:  Chest radiograph 10/28/2021. FINDINGS: Cardiovascular: Atherosclerotic calcification of the aorta, aortic valve and coronary arteries. Enlarged pulmonic trunk and heart. No pericardial effusion. Mediastinum/Nodes: No pathologically enlarged mediastinal or axillary lymph nodes. Hilar regions are difficult to definitively evaluate without IV contrast. Esophagus is dilated and contains fluid, suggesting dysmotility. Lungs/Pleura: Negative for subpleural reticulation, traction bronchiectasis/bronchiolectasis, ground-glass, architectural distortion or honeycombing. Minimal scattered pulmonary parenchymal scarring. Calcified granulomas. No suspicious pulmonary nodules. No pleural fluid. Airway is unremarkable. Assessment for air trapping is challenging due to respiratory motion. Upper Abdomen: Visualized portions of the liver, adrenal glands and spleen are grossly unremarkable. Fundoplication clips. No upper abdominal adenopathy. Musculoskeletal: Degenerative changes in the spine. IMPRESSION: 1. No evidence of interstitial lung disease. 2. Aortic atherosclerosis (ICD10-I70.0). Coronary artery calcification. 3. Enlarged pulmonic trunk, indicative of pulmonary arterial hypertension. Electronically Signed   By: Lorin Picket M.D.   On: 11/12/2021 08:42       Assessment & Plan:  Routine general medical examination at a health care facility  Health care maintenance Assessment & Plan: Physical today 03/02/22.  Mammogram 08/05/21- Birads I. Bone density.  03/31/21 - osteopenia.    Memory  change Assessment & Plan: Concern regarding change in memory and thinking as outlined.  Discussed taking zoloft.  Check tsh and B12.  Request referral to neurology.   Orders: -     Vitamin B12 -     TSH -     Ambulatory referral to Neurology  Anemia, unspecified type Assessment & Plan: Follow cbc.    Anxiety Assessment & Plan: Discussed zoloft.  Follow.     Carotid artery disease, unspecified laterality ([place]) Assessment & Plan: AVVS (02/2021) - Carotid duplex today revealed some progression on the right side with common carotid artery stenosis 50% or greater and internal carotid artery stenosis that remains in the 1 to 39% range on the right.  The left internal carotid artery at the 1 to 39% range and stable from previous studies. Continue current medical regimen which includes Crestor and aspirin.  Recheck in 1 year.    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 miralax has helped bowels.  Was previously referred back to GI for evaluation and question of need for EGD given history.  Saw GI 07/08/21 - ordered UGI with barium swallow.  Pt had previously preferred EGD. Discussed need for f/u with GI.  Discussed with her today.  Discussed recent CT scan - abnormal CT - esophagus - dilated and contained fluid - question of dysmotility.  She had declined previously.  Discussed today.  She is agreeable.  Discussed referral to [facility]. Continue PPI.  Follow.    Gastroparesis Assessment & Plan: Has been followed by GI.    Gastroesophageal reflux disease, unspecified whether esophagitis present Assessment & Plan: Continue on protonix.     Hydronephrosis with ureteropelvic junction (UPJ) obstruction Assessment & Plan: Saw Dr Erlene Quan 02/2021 - secondary to UPJ obstruction being managed conservatively. Asymptomatic. Recommended renal ultrasound and f/u in one year.      Hypercholesterolemia Assessment & Plan: Continue crestor.  Low cholesterol diet and  exercise.  Follow lipid panel and liver function tests.   Lab Results  Component Value Date   CHOL 114 02/25/2022   HDL 52.20 02/25/2022   LDLCALC 50 02/25/2022   TRIG 59.0 02/25/2022   CHOLHDL 2 02/25/2022     Primary hypertension Assessment & Plan: Continue valsartan/hctz and amlodipine.  Continue current medications.  Follow pressures.  Follow metabolic panel.    Other specified hypothyroidism Assessment & Plan: On synthroid.  Follow tsh.    Spinal stenosis, unspecified spinal region Assessment & Plan: Saw Dr Alba Destine recently.  Prescribed tramadol.  Request referral back to discuss treatment options.    Type 1 diabetes mellitus with hyperglycemia ([facility]) Assessment & Plan: Followed by endocrinology.  Discussed low carb diet and exercise.  Follow met b and a1c. Seeing endocrinology.  Using SSI.  Adjusting.    Other orders -     Sertraline HCl; Take 1 tablet (25 mg total) by mouth daily.  Dispense: 30 tablet; Refill: 2     Einar Pheasant, MD

## 2022-03-06 ENCOUNTER — Encounter: Payer: Self-pay | Admitting: Internal Medicine

## 2022-03-06 NOTE — Assessment & Plan Note (Signed)
Has been followed by GI.

## 2022-03-06 NOTE — Assessment & Plan Note (Signed)
Discussed zoloft.  Follow.

## 2022-03-06 NOTE — Assessment & Plan Note (Signed)
Continue on protonix.

## 2022-03-06 NOTE — Assessment & Plan Note (Signed)
History of gastric lymphoma. miralax has helped bowels.  Was previously referred back to GI for evaluation and question of need for EGD given history.  Saw GI 07/08/21 - ordered UGI with barium swallow.  Pt had previously preferred EGD. Discussed need for f/u with GI.  Discussed with her today.  Discussed recent CT scan - abnormal CT - esophagus - dilated and contained fluid - question of dysmotility.  She had declined previously.  Discussed today.  She is agreeable.  Discussed referral to [facility]. Continue PPI.  Follow.

## 2022-03-06 NOTE — Assessment & Plan Note (Signed)
Saw Dr Alba Destine recently.  Prescribed tramadol.  Request referral back to discuss treatment options.

## 2022-03-06 NOTE — Assessment & Plan Note (Signed)
Saw Dr Erlene Quan 02/2021 - secondary to UPJ obstruction being managed conservatively. Asymptomatic. Recommended renal ultrasound and f/u in one year.

## 2022-03-06 NOTE — Assessment & Plan Note (Signed)
Concern regarding change in memory and thinking as outlined.  Discussed taking zoloft.  Check tsh and B12.  Request referral to neurology.

## 2022-03-06 NOTE — Assessment & Plan Note (Signed)
Continue crestor.  Low cholesterol diet and exercise.  Follow lipid panel and liver function tests.   Lab Results  Component Value Date   CHOL 114 02/25/2022   HDL 52.20 02/25/2022   LDLCALC 50 02/25/2022   TRIG 59.0 02/25/2022   CHOLHDL 2 02/25/2022

## 2022-03-06 NOTE — Assessment & Plan Note (Signed)
On synthroid.  Follow tsh.   

## 2022-03-06 NOTE — Assessment & Plan Note (Signed)
[facility]VS (02/2021) - Carotid duplex today revealed some progression on the right side with common carotid artery stenosis 50% or greater and internal carotid artery stenosis that remains in the 1 to 39% range on the right.  The left internal carotid artery at the 1 to 39% range and stable from previous studies. Continue current medical regimen which includes Crestor and aspirin.  Recheck in 1 year.

## 2022-03-06 NOTE — Assessment & Plan Note (Signed)
Followed by endocrinology.  Discussed low carb diet and exercise.  Follow met b and a1c. Seeing endocrinology.  Using SSI.  Adjusting.  

## 2022-03-06 NOTE — Assessment & Plan Note (Signed)
Continue valsartan/hctz and amlodipine.  Continue current medications.  Follow pressures.  Follow metabolic panel.

## 2022-03-06 NOTE — Assessment & Plan Note (Signed)
Follow cbc.  

## 2022-03-10 ENCOUNTER — Telehealth: Payer: Self-pay | Admitting: Internal Medicine

## 2022-03-10 NOTE — Telephone Encounter (Signed)
Copied from [place] [id number]. Topic: Medicare AWV >> Mar 10, 2022  2:37 PM Lollie Marrow wrote: Reason for CRM: Called patient to schedule Medicare Annual Wellness Visit (AWV). Left message for patient to call back and schedule Medicare Annual Wellness Visit (AWV).  Last date of AWV: 01/28/2021  Please schedule an appointment at any time with Denisa, [place].  If any questions, please contact me at [phone].    Thank you,  [place] Direct dial  [phone]

## 2022-03-13 DIAGNOSIS — E1029 Type 1 diabetes mellitus with other diabetic kidney complication: Secondary | ICD-10-CM | POA: Diagnosis not present

## 2022-03-15 DIAGNOSIS — J301 Allergic rhinitis due to pollen: Secondary | ICD-10-CM | POA: Diagnosis not present

## 2022-03-16 ENCOUNTER — Ambulatory Visit: Payer: PPO | Admitting: Physician Assistant

## 2022-03-17 DIAGNOSIS — L218 Other seborrheic dermatitis: Secondary | ICD-10-CM | POA: Diagnosis not present

## 2022-03-18 ENCOUNTER — Telehealth: Payer: Self-pay | Admitting: Internal Medicine

## 2022-03-18 NOTE — Telephone Encounter (Signed)
I called and spoke with the patient and informed her of her new appointment time and date with [facility] and she understood and stated the date and time is okay.  Magie Ciampa,cma

## 2022-03-18 NOTE — Telephone Encounter (Signed)
When I saw Marissa Huff, she was reporting increased back pain.  They had wanted me to touch base with [facility] (Dr Alba Destine and Dr Sharlet Salina office)  I called and they scheduled her an appt for Wednesday 03/23/22 - at 10:15.  If this is a problem for her she can call and change.  Please notify her of appt time.

## 2022-03-23 DIAGNOSIS — M48062 Spinal stenosis, lumbar region with neurogenic claudication: Secondary | ICD-10-CM | POA: Diagnosis not present

## 2022-03-23 DIAGNOSIS — M5442 Lumbago with sciatica, left side: Secondary | ICD-10-CM | POA: Diagnosis not present

## 2022-03-23 DIAGNOSIS — G8929 Other chronic pain: Secondary | ICD-10-CM | POA: Diagnosis not present

## 2022-03-23 DIAGNOSIS — M5441 Lumbago with sciatica, right side: Secondary | ICD-10-CM | POA: Diagnosis not present

## 2022-04-01 ENCOUNTER — Other Ambulatory Visit: Payer: Self-pay | Admitting: Internal Medicine

## 2022-04-12 DIAGNOSIS — E119 Type 2 diabetes mellitus without complications: Secondary | ICD-10-CM | POA: Diagnosis not present

## 2022-04-12 DIAGNOSIS — H353132 Nonexudative age-related macular degeneration, bilateral, intermediate dry stage: Secondary | ICD-10-CM | POA: Diagnosis not present

## 2022-04-13 DIAGNOSIS — E1029 Type 1 diabetes mellitus with other diabetic kidney complication: Secondary | ICD-10-CM | POA: Diagnosis not present

## 2022-04-14 DIAGNOSIS — M48062 Spinal stenosis, lumbar region with neurogenic claudication: Secondary | ICD-10-CM | POA: Diagnosis not present

## 2022-04-20 ENCOUNTER — Other Ambulatory Visit: Payer: Self-pay | Admitting: Internal Medicine

## 2022-04-28 DIAGNOSIS — M5416 Radiculopathy, lumbar region: Secondary | ICD-10-CM | POA: Diagnosis not present

## 2022-05-04 ENCOUNTER — Ambulatory Visit: Payer: PPO | Admitting: Internal Medicine

## 2022-05-06 ENCOUNTER — Other Ambulatory Visit: Payer: Self-pay | Admitting: Internal Medicine

## 2022-05-06 DIAGNOSIS — H9202 Otalgia, left ear: Secondary | ICD-10-CM | POA: Diagnosis not present

## 2022-05-06 DIAGNOSIS — T162XXA Foreign body in left ear, initial encounter: Secondary | ICD-10-CM | POA: Diagnosis not present

## 2022-05-10 ENCOUNTER — Telehealth: Payer: Self-pay | Admitting: Internal Medicine

## 2022-05-10 NOTE — Telephone Encounter (Signed)
Copied from CRM [id number]. Topic: Medicare AWV >> May 10, 2022  1:09 PM Payton Doughty wrote: Reason for CRM: Called patient to schedule Medicare Annual Wellness Visit (AWV). Left message for patient to call back and schedule Medicare Annual Wellness Visit (AWV).  Last date of AWV: 01/28/21  Please schedule an appointment at any time with Annabell Sabal, CMA  .  If any questions, please contact me.  Thank you ,  Verlee Rossetti; Care Guide Ambulatory Clinical Support [place] [facility] Direct Dial: [phone]

## 2022-05-11 ENCOUNTER — Telehealth: Payer: Self-pay | Admitting: Internal Medicine

## 2022-05-11 NOTE — Telephone Encounter (Signed)
Copied from CRM [id number]. Topic: Medicare AWV >> May 11, 2022 11:38 AM Payton Doughty wrote: Reason for CRM: Called patient to schedule Medicare Annual Wellness Visit (AWV). No voicemail available to leave a message.  Last date of AWV: 01/28/21  Please schedule an appointment at any time with Annabell Sabal, CMA  .  If any questions, please contact me.  Thank you ,  Verlee Rossetti; Care Guide Ambulatory Clinical Support [place] [facility] Direct Dial: [phone]

## 2022-05-11 NOTE — Telephone Encounter (Signed)
ERROR

## 2022-05-11 NOTE — Telephone Encounter (Signed)
Contacted Marissa Huff to schedule their annual wellness visit. Call back at later date: REQ CB at 11am today  *Verlee Rossetti; Care Guide Ambulatory Clinical Support [place] [facility] Direct Dial: [phone]

## 2022-05-13 DIAGNOSIS — E1029 Type 1 diabetes mellitus with other diabetic kidney complication: Secondary | ICD-10-CM | POA: Diagnosis not present

## 2022-05-16 ENCOUNTER — Ambulatory Visit (INDEPENDENT_AMBULATORY_CARE_PROVIDER_SITE_OTHER): Payer: PPO | Admitting: Internal Medicine

## 2022-05-16 ENCOUNTER — Encounter: Payer: Self-pay | Admitting: Internal Medicine

## 2022-05-16 VITALS — BP 136/70 | HR 64 | Temp 97.6°F | Ht 63.0 in | Wt 181.6 lb

## 2022-05-16 DIAGNOSIS — N182 Chronic kidney disease, stage 2 (mild): Secondary | ICD-10-CM | POA: Diagnosis not present

## 2022-05-16 DIAGNOSIS — Q6211 Congenital occlusion of ureteropelvic junction: Secondary | ICD-10-CM

## 2022-05-16 DIAGNOSIS — E1065 Type 1 diabetes mellitus with hyperglycemia: Secondary | ICD-10-CM | POA: Diagnosis not present

## 2022-05-16 DIAGNOSIS — I779 Disorder of arteries and arterioles, unspecified: Secondary | ICD-10-CM | POA: Diagnosis not present

## 2022-05-16 DIAGNOSIS — Z8572 Personal history of non-Hodgkin lymphomas: Secondary | ICD-10-CM | POA: Diagnosis not present

## 2022-05-16 DIAGNOSIS — D649 Anemia, unspecified: Secondary | ICD-10-CM

## 2022-05-16 DIAGNOSIS — I10 Essential (primary) hypertension: Secondary | ICD-10-CM

## 2022-05-16 DIAGNOSIS — K219 Gastro-esophageal reflux disease without esophagitis: Secondary | ICD-10-CM

## 2022-05-16 DIAGNOSIS — R413 Other amnesia: Secondary | ICD-10-CM | POA: Diagnosis not present

## 2022-05-16 DIAGNOSIS — E038 Other specified hypothyroidism: Secondary | ICD-10-CM | POA: Diagnosis not present

## 2022-05-16 DIAGNOSIS — E7800 Pure hypercholesterolemia, unspecified: Secondary | ICD-10-CM | POA: Diagnosis not present

## 2022-05-16 DIAGNOSIS — K3184 Gastroparesis: Secondary | ICD-10-CM | POA: Diagnosis not present

## 2022-05-16 DIAGNOSIS — C8599 Non-Hodgkin lymphoma, unspecified, extranodal and solid organ sites: Secondary | ICD-10-CM

## 2022-05-16 DIAGNOSIS — M4800 Spinal stenosis, site unspecified: Secondary | ICD-10-CM

## 2022-05-16 MED ORDER — ROSUVASTATIN CALCIUM 5 MG PO TABS: ORAL_TABLET | ORAL | 1 refills | Status: DC

## 2022-05-16 NOTE — Progress Notes (Signed)
Subjective:    Patient ID: Marissa Huff, female    DOB: 1931-08-10, 89 y.o.   MRN: [id number]  Patient here for  Chief Complaint  Patient presents with   Medical Management of Chronic Issues    HPI Here for a scheduled follow up - diabetes and hypertension.  Has been evaluated recently for back pain with pain into right leg.  Is s/p bilateral L2-3 TFESI 04/14/22.  F/u 04/28/22 - complaining of left side low back pain and travels into left hip.  Recommended left S1 TFESI. Refilled tramadol.  Having persistent issues with her left knee.  Has f/u with ortho 05/17/22.  S/p cortisone injections and viscosupplementation.  Referred to Dr Ernest Pine to discuss knee replacement.  No chest pain reported.  Breathing stable.  No increased cough or congestion.  Swallowing ok.  Discussed benefiber to help with bowels. Blood sugars 100 in am and 188-199 in pm.    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HCC)    Gastroparesis    GERD (gastroesophageal reflux disease)    Hypertension    Hypothyroidism    PUD (peptic ulcer disease)    Skin cancer    Spinal stenosis    lumbar laminectomy   Past Surgical History:  Procedure Laterality Date   ABDOMINAL HYSTERECTOMY     antrectomy and vagotomy  1994   arthroscopic left knee  12/05/08   BACK SURGERY     carpal tunnel repai  10/10   left    CATARACT EXTRACTION, BILATERAL     EYE SURGERY     bilateral cataracts   gastric lymphoma  1996   surgery   LAPAROSCOPIC CHOLECYSTECTOMY  6/01   LUMBAR LAMINECTOMY  1999   LUMBAR LAMINECTOMY WITH COFLEX 1 LEVEL N/A 09/15/2015   Procedure: Lumbar one-two Laminectomy with placement of coflex;  Surgeon: Barnett Abu, MD;  Location: [facility];  Service: Neurosurgery;  Laterality: N/A;   PARTIAL HYSTERECTOMY  1960   fibroid tumors, ovaries not removed   tonsillectomy      TONSILLECTOMY     Family History  Problem Relation Age of Onset   Stroke Mother    Stroke Father    Hypertension Father    Diabetes Brother    Multiple sclerosis Brother    Breast cancer Daughter 47   Colon cancer Neg Hx    Social History   Socioeconomic History   Marital status: Married    Spouse name: Not on file   Number of children: Not on file   Years of education: Not on file   Highest education level: Not on file  Occupational History   Not on file  Tobacco Use   Smoking status: Never   Smokeless tobacco: Never  Vaping Use   Vaping Use: Never used  Substance and Sexual Activity   Alcohol use: No    Alcohol/week: 0.0 standard drinks of alcohol   Drug use: No   Sexual activity: Not Currently    Birth control/protection: None, Post-menopausal  Other Topics Concern   Not on file  Social History Narrative   Not on file   Social Determinants of Health   Financial Resource Strain: Low Risk  (01/28/2021)   Overall Financial Resource Strain (CARDIA)    Difficulty of Paying Living Expenses: Not hard at all  Food  Insecurity: No Food Insecurity (01/28/2021)   Hunger Vital Sign    Worried About Running Out of Food in the Last Year: Never true    Ran Out of Food in the Last Year: Never true  Transportation Needs: No Transportation Needs (01/28/2021)   PRAPARE - Administrator, Civil Service (Medical): No    Lack of Transportation (Non-Medical): No  Physical Activity: Unknown (01/19/2018)   Exercise Vital Sign    Days of Exercise per Week: 0 days    Minutes of Exercise per Session: Not on file  Stress: No Stress Concern Present (01/28/2021)   [employer] of Occupational Health - Occupational Stress Questionnaire    Feeling of Stress : Not at all  Social Connections: Socially Integrated (01/28/2021)   Social Connection and Isolation Panel [NHANES]    Frequency of Communication with Friends and Family: More than three times a week    Frequency of  Social Gatherings with Friends and Family: More than three times a week    Attends Religious Services: More than 4 times per year    Active Member of [employer] or Organizations: Yes    Attends Banker Meetings: Not on file    Marital Status: Married     Review of Systems  Constitutional:  Negative for appetite change and unexpected weight change.  HENT:  Negative for congestion and sinus pressure.   Respiratory:  Negative for cough and chest tightness.        Breathing stable.   Cardiovascular:  Negative for chest pain and palpitations.  Gastrointestinal:  Negative for abdominal pain, diarrhea, nausea and vomiting.  Genitourinary:  Negative for difficulty urinating and dysuria.  Musculoskeletal:  Positive for back pain. Negative for myalgias.  Skin:  Negative for color change and rash.  Neurological:  Negative for dizziness and headaches.  Psychiatric/Behavioral:  Negative for agitation and dysphoric mood.        Objective:     BP 136/70   Pulse 64   Temp 97.6 F (36.4 C) (Oral)   Ht 5\' 3"  (1.6 m)   Wt 181 lb 9.6 oz (82.4 kg)   SpO2 99%   BMI 32.17 kg/m  Wt Readings from Last 3 Encounters:  05/16/22 181 lb 9.6 oz (82.4 kg)  03/02/22 180 lb (81.6 kg)  02/15/22 180 lb (81.6 kg)    Physical Exam Vitals reviewed.  Constitutional:      General: She is not in acute distress.    Appearance: Normal appearance.  HENT:     Head: Normocephalic and atraumatic.     Right Ear: External ear normal.     Left Ear: External ear normal.  Eyes:     General: No scleral icterus.       Right eye: No discharge.        Left eye: No discharge.     Conjunctiva/sclera: Conjunctivae normal.  Neck:     Thyroid: No thyromegaly.  Cardiovascular:     Rate and Rhythm: Normal rate and regular rhythm.  Pulmonary:     Effort: No respiratory distress.     Breath sounds: Normal breath sounds. No wheezing.  Abdominal:     General: Bowel sounds are normal.     Palpations: Abdomen is  soft.     Tenderness: There is no abdominal tenderness.  Musculoskeletal:        General: No swelling or tenderness.     Cervical back: Neck supple. No tenderness.  Lymphadenopathy:     Cervical:  No cervical adenopathy.  Skin:    Findings: No erythema or rash.  Neurological:     Mental Status: She is alert.  Psychiatric:        Mood and Affect: Mood normal.        Behavior: Behavior normal.      Outpatient Encounter Medications as of 05/16/2022  Medication Sig   albuterol (VENTOLIN HFA) 108 (90 Base) MCG/ACT inhaler TAKE 2 PUFFS BY MOUTH EVERY 6 HOURS AS NEEDED FOR WHEEZE OR SHORTNESS OF BREATH   amLODipine (NORVASC) 5 MG tablet TAKE 1 TABLET (5 MG TOTAL) BY MOUTH DAILY.   aspirin 81 MG tablet Take 81 mg by mouth daily.   BD PEN NEEDLE NANO U/F 32G X 4 MM MISC USE 1 EACH 4 (FOUR) TIMES DAILY   Continuous Blood Gluc Receiver (FREESTYLE LIBRE 14 DAY READER) DEVI Use 1 each as needed .  Use to test blood sugar   Continuous Blood Gluc Sensor (FREESTYLE LIBRE 14 DAY SENSOR) MISC Use 1 each every 14 (fourteen) days   fexofenadine (ALLEGRA) 180 MG tablet Take 180 mg by mouth daily.   fluticasone (FLONASE) 50 MCG/ACT nasal spray SPRAY 2 SPRAYS INTO EACH NOSTRIL EVERY DAY   glucagon (GLUCAGON EMERGENCY) 1 MG injection Inject 1 mg into the muscle once as needed.   glucose blood test strip    insulin aspart (NOVOLOG FLEXPEN) 100 UNIT/ML FlexPen Inject 10 Units into the skin in the morning, at noon, and at bedtime.   LANTUS SOLOSTAR 100 UNIT/ML Solostar Pen Inject 28-30 Units into the skin at bedtime.   levothyroxine (SYNTHROID) 200 MCG tablet TAKE 1 TABLET (200 MCG TOTAL) BY MOUTH DAILY BEFORE BREAKFAST.   Multiple Vitamins-Minerals (PRESERVISION AREDS PO) Take 1 capsule by mouth 2 (two) times daily.   pantoprazole (PROTONIX) 40 MG tablet TAKE 1 TABLET BY MOUTH TWICE A DAY   valsartan-hydrochlorothiazide (DIOVAN-HCT) 160-12.5 MG tablet TAKE 1 TABLET BY MOUTH EVERY DAY   [DISCONTINUED]  rosuvastatin (CRESTOR) 5 MG tablet TAKE 1 TABLET BY MOUTH THREE TIMES A WEEK.   Calcium Citrate-Vitamin D (CALCIUM + D PO) Take 1 tablet by mouth daily. (Patient not taking: Reported on 05/16/2022)   rosuvastatin (CRESTOR) 5 MG tablet TAKE 1 TABLET BY MOUTH THREE TIMES A WEEK.   [DISCONTINUED] Multiple Vitamins-Minerals (MULTIVITAMIN PO) Take 1 tablet by mouth daily.  (Patient not taking: Reported on 05/16/2022)   [DISCONTINUED] sertraline (ZOLOFT) 25 MG tablet Take 1 tablet (25 mg total) by mouth daily. (Patient not taking: Reported on 05/16/2022)   No facility-administered encounter medications on file as of 05/16/2022.     Lab Results  Component Value Date   WBC 4.9 03/04/2021   HGB 12.0 03/04/2021   HCT 35.8 (L) 03/04/2021   PLT 203.0 03/04/2021   GLUCOSE 210 (H) 02/25/2022   CHOL 114 02/25/2022   TRIG 59.0 02/25/2022   HDL 52.20 02/25/2022   LDLCALC 50 02/25/2022   ALT 17 02/25/2022   AST 17 02/25/2022   NA 141 02/25/2022   K 4.0 02/25/2022   CL 105 02/25/2022   CREATININE 0.74 02/25/2022   BUN 23 02/25/2022   CO2 28 02/25/2022   TSH 0.92 03/02/2022   HGBA1C 7.6 (H) 02/25/2022   MICROALBUR 2.3 (H) 07/16/2021    CT Chest High Resolution  Result Date: 11/12/2021 CLINICAL DATA:  Abnormal chest radiograph. EXAM: CT CHEST WITHOUT CONTRAST TECHNIQUE: Multidetector CT imaging of the chest was performed following the standard protocol without intravenous contrast. High resolution imaging of the lungs,  as well as inspiratory and expiratory imaging, was performed. RADIATION DOSE REDUCTION: This exam was performed according to the departmental dose-optimization program which includes automated exposure control, adjustment of the mA and/or kV according to patient size and/or use of iterative reconstruction technique. COMPARISON:  Chest radiograph 10/28/2021. FINDINGS: Cardiovascular: Atherosclerotic calcification of the aorta, aortic valve and coronary arteries. Enlarged pulmonic trunk and  heart. No pericardial effusion. Mediastinum/Nodes: No pathologically enlarged mediastinal or axillary lymph nodes. Hilar regions are difficult to definitively evaluate without IV contrast. Esophagus is dilated and contains fluid, suggesting dysmotility. Lungs/Pleura: Negative for subpleural reticulation, traction bronchiectasis/bronchiolectasis, ground-glass, architectural distortion or honeycombing. Minimal scattered pulmonary parenchymal scarring. Calcified granulomas. No suspicious pulmonary nodules. No pleural fluid. Airway is unremarkable. Assessment for air trapping is challenging due to respiratory motion. Upper Abdomen: Visualized portions of the liver, adrenal glands and spleen are grossly unremarkable. Fundoplication clips. No upper abdominal adenopathy. Musculoskeletal: Degenerative changes in the spine. IMPRESSION: 1. No evidence of interstitial lung disease. 2. Aortic atherosclerosis (ICD10-I70.0). Coronary artery calcification. 3. Enlarged pulmonic trunk, indicative of pulmonary arterial hypertension. Electronically Signed   By: Leanna Battles M.D.   On: 11/12/2021 08:42       Assessment & Plan:  Anemia, unspecified type Assessment & Plan: Follow cbc.   Orders: -     CBC with Differential/Platelet; Future  Carotid artery disease, unspecified laterality (HCC) Assessment & Plan: Carotid duplex 02/2022 reveals some progression on the right side with common carotid artery stenosis that now appears to be 50% or greater and internal carotid artery stenosis that remains in the 1 to 39% range on the right.  The left internal carotid artery at the 1 to 39% range and stable from previous studies.  No role for intervention in an asymptomatic 89 year old.  Continue current medical regimen which includes Crestor and aspirin.  Recheck in 1 year.    CKD (chronic kidney disease) stage 2, GFR 60-89 ml/min Assessment & Plan: Try to limit antiinflammatories.  Stay hydrated.  Follow metabolic panel.    Orders: -     Basic metabolic panel; Future  Gastric lymphoma (HCC) Assessment & Plan: History of gastric lymphoma. miralax has helped bowels.  Was previously referred back to GI for evaluation and question of need for EGD given history.  Saw GI 07/08/21 - ordered UGI with barium swallow.  Pt had previously preferred EGD. Discussed need for f/u with GI.  Discussed with her today.  Discussed recent CT scan - abnormal CT - esophagus - dilated and contained fluid - question of dysmotility.  She had declined previously.  Previously referred to [facility].  Needs f/u.     Gastroparesis Assessment & Plan: Has been followed by GI.    Gastroesophageal reflux disease, unspecified whether esophagitis present Assessment & Plan: Continue on protonix.     History of lymphoma Assessment & Plan: History of gastric lymphoma.  Follow up - GI as outlined.    Hydronephrosis with ureteropelvic junction (UPJ) obstruction Assessment & Plan: Saw Dr Apolinar Junes 02/2021 - secondary to UPJ obstruction being managed conservatively. Asymptomatic. Recommended renal ultrasound and f/u in one year.      Hypercholesterolemia Assessment & Plan: Continue crestor.  Low cholesterol diet and exercise.  Follow lipid panel and liver function tests.   Lab Results  Component Value Date   CHOL 114 02/25/2022   HDL 52.20 02/25/2022   LDLCALC 50 02/25/2022   TRIG 59.0 02/25/2022   CHOLHDL 2 02/25/2022    Orders: -  Hepatic function panel; Future -     Lipid panel; Future  Primary hypertension Assessment & Plan: Continue valsartan/hctz and amlodipine.  Continue current medications.  Follow pressures.  Follow metabolic panel.    Other specified hypothyroidism Assessment & Plan: On synthroid.  Follow tsh.    Memory change Assessment & Plan: Concern regarding change in memory and thinking as outlined. Have referred to neurology.     Spinal stenosis, unspecified spinal region Assessment & Plan: Saw Dr  Mariah Milling recently.  Prescribed tramadol.  Continue f/u.    Type 1 diabetes mellitus with hyperglycemia (HCC) Assessment & Plan: Followed by endocrinology.  Discussed low carb diet and exercise.  Follow met b and a1c. Seeing endocrinology.  Using SSI.  Adjusting.   Orders: -     Hemoglobin A1c; Future  Other orders -     Rosuvastatin Calcium; TAKE 1 TABLET BY MOUTH THREE TIMES A WEEK.  Dispense: 40 tablet; Refill: 1     Dale Oneida, MD

## 2022-05-17 DIAGNOSIS — M1732 Unilateral post-traumatic osteoarthritis, left knee: Secondary | ICD-10-CM | POA: Diagnosis not present

## 2022-05-17 DIAGNOSIS — M2392 Unspecified internal derangement of left knee: Secondary | ICD-10-CM | POA: Diagnosis not present

## 2022-05-19 DIAGNOSIS — M5416 Radiculopathy, lumbar region: Secondary | ICD-10-CM | POA: Diagnosis not present

## 2022-05-29 ENCOUNTER — Encounter: Payer: Self-pay | Admitting: Internal Medicine

## 2022-05-29 NOTE — Assessment & Plan Note (Signed)
Saw Dr Brandon 02/2021 - secondary to UPJ obstruction being managed conservatively. Asymptomatic. Recommended renal ultrasound and f/u in one year.    

## 2022-05-29 NOTE — Assessment & Plan Note (Signed)
Follow cbc.  

## 2022-05-29 NOTE — Assessment & Plan Note (Signed)
Continue on protonix.   

## 2022-05-29 NOTE — Assessment & Plan Note (Signed)
Concern regarding change in memory and thinking as outlined. Have referred to neurology.

## 2022-05-29 NOTE — Assessment & Plan Note (Signed)
Try to limit antiinflammatories.  Stay hydrated.  Follow metabolic panel.  

## 2022-05-29 NOTE — Assessment & Plan Note (Signed)
History of gastric lymphoma. miralax has helped bowels.  Was previously referred back to GI for evaluation and question of need for EGD given history.  Saw GI 07/08/21 - ordered UGI with barium swallow.  Pt had previously preferred EGD. Discussed need for f/u with GI.  Discussed with her today.  Discussed recent CT scan - abnormal CT - esophagus - dilated and contained fluid - question of dysmotility.  She had declined previously.  Previously referred to [facility].  Needs f/u.

## 2022-05-29 NOTE — Assessment & Plan Note (Signed)
Followed by endocrinology.  Discussed low carb diet and exercise.  Follow met b and a1c. Seeing endocrinology.  Using SSI.  Adjusting.  

## 2022-05-29 NOTE — Assessment & Plan Note (Signed)
Continue valsartan/hctz and amlodipine.  Continue current medications.  Follow pressures.  Follow metabolic panel.  ?

## 2022-05-29 NOTE — Assessment & Plan Note (Signed)
Saw Dr Mariah Milling recently.  Prescribed tramadol.  Continue f/u.

## 2022-05-29 NOTE — Assessment & Plan Note (Signed)
History of gastric lymphoma.  Follow up - GI as outlined.

## 2022-05-29 NOTE — Assessment & Plan Note (Signed)
Continue crestor.  Low cholesterol diet and exercise.  Follow lipid panel and liver function tests.   Lab Results  Component Value Date   CHOL 114 02/25/2022   HDL 52.20 02/25/2022   LDLCALC 50 02/25/2022   TRIG 59.0 02/25/2022   CHOLHDL 2 02/25/2022   

## 2022-05-29 NOTE — Assessment & Plan Note (Signed)
Carotid duplex 02/2022 reveals some progression on the right side with common carotid artery stenosis that now appears to be 50% or greater and internal carotid artery stenosis that remains in the 1 to 39% range on the right.  The left internal carotid artery at the 1 to 39% range and stable from previous studies.  No role for intervention in an asymptomatic 89 year old.  Continue current medical regimen which includes Crestor and aspirin.  Recheck in 1 year.

## 2022-05-29 NOTE — Assessment & Plan Note (Signed)
On synthroid.  Follow tsh.   

## 2022-05-29 NOTE — Assessment & Plan Note (Signed)
Has been followed by GI.   

## 2022-06-01 DIAGNOSIS — L570 Actinic keratosis: Secondary | ICD-10-CM | POA: Diagnosis not present

## 2022-06-01 DIAGNOSIS — L218 Other seborrheic dermatitis: Secondary | ICD-10-CM | POA: Diagnosis not present

## 2022-06-02 DIAGNOSIS — M5441 Lumbago with sciatica, right side: Secondary | ICD-10-CM | POA: Diagnosis not present

## 2022-06-02 DIAGNOSIS — M5442 Lumbago with sciatica, left side: Secondary | ICD-10-CM | POA: Diagnosis not present

## 2022-06-02 DIAGNOSIS — G8929 Other chronic pain: Secondary | ICD-10-CM | POA: Diagnosis not present

## 2022-06-02 DIAGNOSIS — M47816 Spondylosis without myelopathy or radiculopathy, lumbar region: Secondary | ICD-10-CM | POA: Diagnosis not present

## 2022-06-02 DIAGNOSIS — M5416 Radiculopathy, lumbar region: Secondary | ICD-10-CM | POA: Diagnosis not present

## 2022-06-10 DIAGNOSIS — M47816 Spondylosis without myelopathy or radiculopathy, lumbar region: Secondary | ICD-10-CM | POA: Diagnosis not present

## 2022-06-13 ENCOUNTER — Other Ambulatory Visit: Payer: PPO

## 2022-06-13 DIAGNOSIS — E1029 Type 1 diabetes mellitus with other diabetic kidney complication: Secondary | ICD-10-CM | POA: Diagnosis not present

## 2022-06-16 DIAGNOSIS — M1712 Unilateral primary osteoarthritis, left knee: Secondary | ICD-10-CM | POA: Diagnosis not present

## 2022-06-19 DIAGNOSIS — M1712 Unilateral primary osteoarthritis, left knee: Secondary | ICD-10-CM | POA: Insufficient documentation

## 2022-06-20 ENCOUNTER — Other Ambulatory Visit: Payer: Self-pay | Admitting: Internal Medicine

## 2022-06-20 ENCOUNTER — Telehealth: Payer: Self-pay

## 2022-06-20 NOTE — Telephone Encounter (Signed)
Received note. Will review and schedule patient for pre op appt.

## 2022-06-20 NOTE — Telephone Encounter (Signed)
Received a progress note from Dr. Francesco Sor, Montez Hageman., at [facility].  It included a message at the top of the fax regarding patient's upcoming surgery.  I saved document in patient's chart, since it's a progress note and hand-delivered a copy to Rita Ohara, LPN.

## 2022-06-21 NOTE — Telephone Encounter (Signed)
Need to schedule earlier appt - in case further testing needed prior to surgery. I can see her Thursday 06/2022 - if needed.

## 2022-06-21 NOTE — Telephone Encounter (Signed)
She is scheduled for surgery 07/06/22. We can see her 7/1 unless you want to see her sooner. Let me know

## 2022-06-22 NOTE — Discharge Instructions (Signed)
Instructions after Total Knee Replacement   Azalyn Sliwa P. Jonny Dearden, Jr., M.D.    Dept. of Orthopaedics & Sports Medicine [facility] [address] [place], [place]  [address]  Phone: [phone]   Fax: [phone]       [email or web address]       DIET: Drink plenty of non-alcoholic fluids. Resume your normal diet. Include foods high in fiber.  ACTIVITY:  You may use crutches or a walker with weight-bearing as tolerated, unless instructed otherwise. You may be weaned off of the walker or crutches by your Physical Therapist.  Do NOT place pillows under the knee. Anything placed under the knee could limit your ability to straighten the knee.   Continue doing gentle exercises. Exercising will reduce the pain and swelling, increase motion, and prevent muscle weakness.   Please continue to use the TED compression stockings for 6 weeks. You may remove the stockings at night, but should reapply them in the morning. Do not drive or operate any equipment until instructed.  WOUND CARE:  Continue to use the PolarCare or ice packs periodically to reduce pain and swelling. You may bathe or shower after the staples are removed at the first office visit following surgery.  MEDICATIONS: You may resume your regular medications. Please take the pain medication as prescribed on the medication. Do not take pain medication on an empty stomach. You have been given a prescription for a blood thinner (Lovenox or Coumadin). Please take the medication as instructed. (NOTE: After completing a 2 week course of Lovenox, take one Enteric-coated aspirin once a day. This along with elevation will help reduce the possibility of phlebitis in your operated leg.) Do not drive or drink alcoholic beverages when taking pain medications.  CALL THE OFFICE FOR: Temperature above 101 degrees Excessive bleeding or drainage on the dressing. Excessive swelling, coldness, or paleness of the toes. Persistent nausea and  vomiting.  FOLLOW-UP:  You should have an appointment to return to the office in 10-14 days after surgery. Arrangements have been made for continuation of Physical Therapy (either home therapy or outpatient therapy).     [facility] Department Directory         [email or web address]       [email or web address]          Cardiology  Appointments: [place] - [phone] [place] - [phone]  Endocrinology  Appointments: [place] - [phone] [place] - [phone]  Gastroenterology  Appointments: [place] - [phone] [place] - [phone]        General Surgery   Appointments: [place] - [phone]  Internal Medicine/Family Medicine  Appointments: [place] - [phone] [place] - [phone] [place] - [phone]  Metabolic and Weigh Loss Surgery  Appointments: [place] - [phone]        Neurology  Appointments: [place] - [phone] [place] - [phone]  Neurosurgery  Appointments: [place] - [phone]  Obstetrics & Gynecology  Appointments: [place] - [phone] [place] - [phone]        Pediatrics  Appointments: [place] - [phone] [place] - [phone]  Physiatry  Appointments:  [place] -[phone]  Physical Therapy  Appointments: [place] - [phone] [place] - [phone]        Podiatry  Appointments: [place] - [phone] [place] - [phone]  Pulmonology  Appointments: [place] - [phone]  Rheumatology  Appointments: [place] - [phone]        [place] Location: [facility]  [address] [place], [place]  [address]  [place] Location: [facility] [address] [place], [place]  [address]  [place] Location: [facility] [address] [place], [place]  [address]    

## 2022-06-22 NOTE — H&P (Signed)
ORTHOPAEDIC HISTORY & PHYSICAL Marissa Huff, Marissa Huff., MD - 06/16/2022 11:30 AM EDT Formatting of this note is different from the original. Images from the original note were not included. Chief Complaint: Chief Complaint Patient presents with Knee Pain LEFT KNEE PAIN  Reason for Visit: The patient is a 89 y.o. female who presents today with her husband for reevaluation of her left knee. She reports a long history of left knee pain. She localizes most of the pain along the medial and anterior aspect of the knee. She reports no swelling, no locking, and some giving way of the knee. The pain is aggravated by any weight bearing. The knee pain limits the patient's ability to ambulate long distances.The patient has not appreciated any significant improvement despite Tylenol, intraarticular corticosteroid injections, viscosupplementation, activity modification, and ambulatory aids. She is using a cane for ambulation. The patient states that the knee pain has progressed to the point that it is significantly interfering with her activities of daily living.  Medications: Current Outpatient Medications Medication Sig Dispense Refill albuterol 90 mcg/actuation inhaler Inhale into the lungs as needed amLODIPine (NORVASC) 5 MG tablet Take 1 tablet (5 mg total) by mouth once daily aspirin 81 MG EC tablet Take 1 tablet (81 mg total) by mouth once daily blood-glucose sensor (FREESTYLE LIBRE 3 SENSOR) Devi Use 1 Device every 14 (fourteen) days 6 each 4 calcium carbonate-vitamin D3 500 mg calcium- 400 unit/5 mL Liqd Take 1 tablet by mouth once daily clobetasoL (CORMAX) 0.05 % external solution Apply topically 2 (two) times daily doxylamine succinate 25 mg tablet Take 25 mg by mouth at bedtime as needed EPINEPHrine (EPIPEN) 0.3 mg/0.3 mL pen injector fexofenadine (ALLEGRA) 180 MG tablet Take 1 tablet (180 mg total) by mouth once daily flash glucose scanning reader (FREESTYLE LIBRE 14 DAY READER) Misc Use 1  each as needed . Use to test blood sugar 1 each 0 fluocinolone acetonide (DERMOTIC) 0.01 % otic drop fluticasone (FLONASE) 50 mcg/actuation nasal spray 2 sprays at bedtime FREESTYLE LIBRE 14 DAY SENSOR kit USE 1 KIT EVERY 14 (FOURTEEN) DAYS E10.29 2 kit 10 glucagon (GLUCAGON) 1 mg Kit Inject into the muscle insulin ASPART (NOVOLOG FLEXPEN U-100 INSULIN) pen injector (concentration 100 units/mL) INJECT 10 UNITS SUBCUTANEOUSLY 3 (THREE) TIMES DAILY WITH MEALS 30 mL 3 insulin GLARGINE (LANTUS SOLOSTAR U-100 INSULIN) pen injector (concentration 100 units/mL) INJECT 28 UNITS NIGHTLY 15 mL 11 ketoconazole (NIZORAL) 2 % shampoo Apply topically WASH SCALP 3(THREE) TIMES A WEEK. LEAVE IT PLACE FOR 30 MINUTES BEFORE RINSING. levothyroxine (SYNTHROID) 200 MCG tablet Take 200 mcg by mouth once daily LORazepam (ATIVAN) 0.5 MG tablet Take 0.5 mg by mouth continuously as needed Pt takes .5-1 tablet as needed metFORMIN (GLUMETZA) 500 MG (MOD) ER tablet Take 1 tablet (500 mg total) by mouth 2 (two) times daily. 60 tablet 0 miscellaneous medical supply Misc multivitamin with minerals tablet Take 1 tablet by mouth once daily pantoprazole (PROTONIX) 40 MG DR tablet Take 1 tablet by mouth 2 (two) times daily pen needle, diabetic (BD ULTRA-FINE NANO PEN NEEDLE) 32 gauge x 5/32" Ndle Use 1 each 4 (four) times daily 400 each 1 rosuvastatin (CRESTOR) 5 MG tablet Take 1 tablet (5 mg total) by mouth 3 (three) times a week sertraline (ZOLOFT) 25 MG tablet Take 25 mg by mouth once daily traMADoL (ULTRAM) 50 mg tablet 1 po bid prn pain 20 tablet 0 valsartan-hydrochlorothiazide (DIOVAN-HCT) 160-12.5 mg tablet Take 1 tablet by mouth once daily  No current facility-administered medications for this  visit.  Allergies: Allergies Allergen Reactions Penicillins Rash  Past Medical History: Past Medical History: Diagnosis Date Allergic rhinitis Carpal tunnel syndrome, bilateral Chicken pox Diabetes mellitus, type II,  insulin dependent (CMS/HHS-HCC) Diverticulosis Gastric lymphoma (CMS/HHS-HCC) Gastroparesis GERD (gastroesophageal reflux disease) Hemorrhoids Hypertension Hypothyroid Peptic ulcer Spinal stenosis with lumbar laminectomy Vertigo  Past Surgical History: Past Surgical History: Procedure Laterality Date ABDOMINAL HYSTERECTOMY W/ PARTIAL VAGINACTOMY 1960 ANTRECTOMY AND VAGOTOMY 1994 Gastric lymphoma surgery 1996 LAMINECTOMY LUMBAR SPINE 1999 LAPAROSCOPIC CHOLECYSTECTOMY 06/1999 COLONOSCOPY 05/24/2007 KNEE ARTHROSCOPY Left 12/05/2008 partial, medial, and lateral meniscectomies, and chondroplasty INCISION TENDON SHEATH FOR TRIGGER FINGER Right 06/19/2009 KNEE ARTHROSCOPY Left 06/16/2010 partial, medial, and lateral meniscectomies ENDOSCOPIC CARPAL TUNNEL RELEASE Bilateral Left 10/24/2008. Right 06/19/2009. TONSILLECTOMY AND ADENOIDECTOMY  Social History: Social History  Socioeconomic History Marital status: Married Spouse name: Marissa Huff Number of children: 2 Years of education: 12 Tobacco Use Smoking status: Never Smokeless tobacco: Never Vaping Use Vaping status: Never Used Substance and Sexual Activity Alcohol use: No Drug use: No Sexual activity: Defer  Social Determinants of Health  Financial Resource Strain: Low Risk (01/28/2021) Received from [facility] [place] Financial Resource Strain (CARDIA) Difficulty of Paying Living Expenses: Not hard at all Food Insecurity: No Food Insecurity (01/28/2021) Received from [facility], [place] Hunger Vital Sign Worried About Running Out of Food in the Last Year: Never true Ran Out of Food in the Last Year: Never true Transportation Needs: No Transportation Needs (01/28/2021) Received from [facility], [place] PRAPARE - Transportation Lack of Transportation (Medical): No Lack of Transportation (Non-Medical): No Physical Activity: Unknown (01/19/2018) [facility] Exercise Vital Sign Days of Exercise per Week: 0 days Stress: No Stress Concern Present (01/28/2021) Received from [facility], [facility] of Occupational Health - Occupational Stress Questionnaire Feeling of Stress : Not at all Social Connections: Socially Integrated (01/28/2021) Received from [facility], [employer] Social Connection and Isolation Panel [NHANES] Frequency of Communication with Friends and Family: More than three times a week Frequency of Social Gatherings with Friends and Family: More than three times a week Attends Religious Services: More than 4 times per year Active Member of [employer] or Organizations: Yes Marital Status: Married  Family History: Family History Problem Relation Name Age of Onset Stroke Mother High blood pressure (Hypertension) Father Stroke Father Diabetes Brother Cancer Other SIBLING  Review of Systems: A comprehensive 14 point ROS was performed, reviewed, and the pertinent orthopaedic findings are documented in the HPI.  Exam BP 130/70 (BP Location: Left upper arm, Patient Position: Sitting, BP Cuff Size: Adult)  Ht 157.5 cm (5\' 2" )  Wt 82.1 kg (181 lb)  BMI 33.11 kg/m  General: Well-developed, well-nourished female seen in no acute distress. Antalgic gait. Slight valgus thrust to the left knee.  HEENT: Atraumatic, normocephalic. Pupils are equal and reactive to light. Extraocular motion is intact. Sclera are clear. Oropharynx is clear with moist mucosa.  Neck: Supple, nontender, and with good ROM. No thyromegaly, adenopathy, JVD, or carotid bruits.  Lungs: Clear to auscultation bilaterally.  Cardiovascular: Regular rate and rhythm. Normal S1, S2. No murmur. No appreciable gallops or rubs. Peripheral pulses are palpable. No lower extremity edema. Homan`s test is negative.  Abdomen: Soft, nontender, nondistended. Bowel sounds are present.  Extremities: Good strength, stability, and range of motion  of the upper extremities. Good range of motion of the hips and ankles.  Left Knee: Soft tissue swelling: minimal Effusion: none Erythema: none Crepitance: mild Tenderness: medial,  anterior Alignment: relative valgus Mediolateral laxity: lateral pseudolaxity Posterior sag: negative Patellar tracking: Good tracking without evidence of subluxation or tilt Atrophy: No significant atrophy. Quadriceps tone was fair to good. Range of motion: 0/0/114 degrees  Neurologic: Awake, alert, and oriented. Sensory function is intact to pinprick and light touch. Motor strength is judged to be 5/5. Motor coordination is within normal limits. No apparent clonus. No tremor.  X-rays: I reviewed the left knee radiographs that were performed at [facility] on 05/26/2022. Chondrocalcinosis is present. Degenerative changes to the patellofemoral articulation are noted.  Impression: Degenerative arthrosis of the left knee  Plan: The findings were discussed in detail with the patient. The patient was given informational material on total knee replacement. Conservative treatment options were reviewed with the patient. We discussed the risks and benefits of surgical intervention. The usual perioperative course was also discussed in detail. The patient expressed understanding of the risks and benefits of surgical intervention and would like to proceed with plans for left total knee arthroplasty.  Hemoglobin A1c is an indication of glucose control. Uncontrolled diabetes has been associated with perioperative complications including poor wound healing and surgical site infections. To decrease the risk of perioperative complications, hemoglobin A1c must be less than 8.0 prior to surgery. The patient is encouraged to work with her primary care physician and/or endocrinologist to optimize glucose management.  Lab Results Component Value Date HGBA1C 7.7 (H) 02/18/2022 HGBA1C 8.0 (H) 06/04/2021  I spent a  total of 45 minutes in both face-to-face and non-face-to-face activities, excluding procedures performed, for this visit on the date of this encounter.  MEDICAL CLEARANCE: Per anesthesiology and Dr. Dale [place]. ACTIVITY: As tolerated. WORK STATUS: Not applicable. THERAPY: Preoperative physical therapy evaluation. MEDICATIONS: Requested Prescriptions  No prescriptions requested or ordered in this encounter  FOLLOW-UP: Return for preop History & Physical pending surgery date.  Marissa Huff P. Angie Fava., M.D.  This note was generated in part with voice recognition software and I apologize for any typographical errors that were not detected and corrected.  Electronically signed by Shari Heritage., MD at 06/19/2022 10:36 PM EDT

## 2022-06-22 NOTE — Telephone Encounter (Signed)
Called patient unable to leave message

## 2022-06-23 NOTE — Telephone Encounter (Signed)
LMTCB. If patient returns call please schedule her for pre op appointment 06/23/22 at 12:00 with Dr Lorin Picket.

## 2022-06-23 NOTE — Telephone Encounter (Signed)
Patient is unable to come today. Scheduled for 6/27 at 4pm.

## 2022-06-24 DIAGNOSIS — G8929 Other chronic pain: Secondary | ICD-10-CM | POA: Diagnosis not present

## 2022-06-24 DIAGNOSIS — M5441 Lumbago with sciatica, right side: Secondary | ICD-10-CM | POA: Diagnosis not present

## 2022-06-24 DIAGNOSIS — M5416 Radiculopathy, lumbar region: Secondary | ICD-10-CM | POA: Diagnosis not present

## 2022-06-24 DIAGNOSIS — M47816 Spondylosis without myelopathy or radiculopathy, lumbar region: Secondary | ICD-10-CM | POA: Diagnosis not present

## 2022-06-24 DIAGNOSIS — M5442 Lumbago with sciatica, left side: Secondary | ICD-10-CM | POA: Diagnosis not present

## 2022-06-27 ENCOUNTER — Inpatient Hospital Stay: Admission: RE | Admit: 2022-06-27 | Payer: PPO | Source: Ambulatory Visit

## 2022-06-28 ENCOUNTER — Encounter
Admission: RE | Admit: 2022-06-28 | Discharge: 2022-06-28 | Disposition: A | Discharge status: Home / Self Care | Payer: PPO | Source: Ambulatory Visit | Attending: Orthopedic Surgery | Admitting: Orthopedic Surgery

## 2022-06-28 VITALS — BP 118/64 | HR 67 | Temp 97.8°F | Resp 16 | Ht 63.0 in | Wt 178.0 lb

## 2022-06-28 DIAGNOSIS — D649 Anemia, unspecified: Secondary | ICD-10-CM

## 2022-06-28 DIAGNOSIS — R8290 Unspecified abnormal findings in urine: Secondary | ICD-10-CM | POA: Insufficient documentation

## 2022-06-28 DIAGNOSIS — N182 Chronic kidney disease, stage 2 (mild): Secondary | ICD-10-CM

## 2022-06-28 DIAGNOSIS — R8271 Bacteriuria: Secondary | ICD-10-CM | POA: Insufficient documentation

## 2022-06-28 DIAGNOSIS — Z01818 Encounter for other preprocedural examination: Secondary | ICD-10-CM | POA: Diagnosis not present

## 2022-06-28 DIAGNOSIS — Z01810 Encounter for preprocedural cardiovascular examination: Secondary | ICD-10-CM | POA: Diagnosis not present

## 2022-06-28 DIAGNOSIS — N390 Urinary tract infection, site not specified: Secondary | ICD-10-CM

## 2022-06-28 DIAGNOSIS — M1712 Unilateral primary osteoarthritis, left knee: Secondary | ICD-10-CM | POA: Insufficient documentation

## 2022-06-28 DIAGNOSIS — C8599 Non-Hodgkin lymphoma, unspecified, extranodal and solid organ sites: Secondary | ICD-10-CM

## 2022-06-28 DIAGNOSIS — R8281 Pyuria: Secondary | ICD-10-CM | POA: Diagnosis not present

## 2022-06-28 DIAGNOSIS — Z880 Allergy status to penicillin: Secondary | ICD-10-CM | POA: Diagnosis not present

## 2022-06-28 DIAGNOSIS — N3946 Mixed incontinence: Secondary | ICD-10-CM

## 2022-06-28 DIAGNOSIS — E1065 Type 1 diabetes mellitus with hyperglycemia: Secondary | ICD-10-CM | POA: Diagnosis not present

## 2022-06-28 DIAGNOSIS — Z01812 Encounter for preprocedural laboratory examination: Secondary | ICD-10-CM

## 2022-06-28 HISTORY — DX: Chronic kidney disease, stage 2 (mild): N18.2

## 2022-06-28 HISTORY — DX: Type 2 diabetes mellitus with other circulatory complications: E11.59

## 2022-06-28 HISTORY — DX: Hypertension secondary to endocrine disorders: I15.2

## 2022-06-28 HISTORY — DX: Personal history of urinary calculi: Z87.442

## 2022-06-28 HISTORY — DX: Non-Hodgkin lymphoma, unspecified, unspecified site: C85.90

## 2022-06-28 HISTORY — DX: Disorder of arteries and arterioles, unspecified: I77.9

## 2022-06-28 HISTORY — DX: Unspecified urinary incontinence: R32

## 2022-06-28 HISTORY — DX: Type 1 diabetes mellitus without complications: E10.9

## 2022-06-28 HISTORY — DX: Hyperlipidemia, unspecified: E78.5

## 2022-06-28 LAB — COMPREHENSIVE METABOLIC PANEL
ALT: 16 U/L (ref 0–44)
AST: 21 U/L (ref 15–41)
Albumin: 3.9 g/dL (ref 3.5–5.0)
Alkaline Phosphatase: 84 U/L (ref 38–126)
Anion gap: 10 (ref 5–15)
BUN: 21 mg/dL (ref 8–23)
CO2: 23 mmol/L (ref 22–32)
Calcium: 9.4 mg/dL (ref 8.9–10.3)
Chloride: 103 mmol/L (ref 98–111)
Creatinine, Ser: 0.79 mg/dL (ref 0.44–1.00)
GFR, Estimated: >60 mL/min (ref >60)
Glucose, Bld: 189 mg/dL — ABNORMAL HIGH (ref 70–99)
Potassium: 3.5 mmol/L (ref 3.5–5.1)
Sodium: 136 mmol/L (ref 135–145)
Total Bilirubin: 0.8 mg/dL (ref 0.3–1.2)
Total Protein: 6.9 g/dL (ref 6.5–8.1)

## 2022-06-28 LAB — TYPE AND SCREEN
ABO/RH(D): O NEG
Antibody Screen: NEGATIVE

## 2022-06-28 LAB — URINALYSIS, ROUTINE W REFLEX MICROSCOPIC
Bilirubin Urine: NEGATIVE
Glucose, UA: NEGATIVE mg/dL
Hgb urine dipstick: NEGATIVE
Ketones, ur: 5 mg/dL — AB
Nitrite: POSITIVE — AB
Protein, ur: NEGATIVE mg/dL
Specific Gravity, Urine: 1.017 (ref 1.005–1.030)
WBC, UA: >50 WBC/hpf (ref 0–5)
pH: 5 (ref 5.0–8.0)

## 2022-06-28 LAB — SEDIMENTATION RATE: Sed Rate: 15 mm/hr (ref 0–30)

## 2022-06-28 LAB — CBC
HCT: 35.1 % — ABNORMAL LOW (ref 36.0–46.0)
Hemoglobin: 11.6 g/dL — ABNORMAL LOW (ref 12.0–15.0)
MCH: 29.7 pg (ref 26.0–34.0)
MCHC: 33 g/dL (ref 30.0–36.0)
MCV: 89.8 fL (ref 80.0–100.0)
Platelets: 226 10*3/uL (ref 150–400)
RBC: 3.91 MIL/uL (ref 3.87–5.11)
RDW: 14.5 % (ref 11.5–15.5)
WBC: 5.8 10*3/uL (ref 4.0–10.5)
nRBC: 0 % (ref 0.0–0.2)

## 2022-06-28 LAB — C-REACTIVE PROTEIN: CRP: 0.5 mg/dL (ref <1.0)

## 2022-06-28 LAB — SURGICAL PCR SCREEN
MRSA, PCR: NEGATIVE
Staphylococcus aureus: NEGATIVE

## 2022-06-28 NOTE — Patient Instructions (Addendum)
Your procedure is scheduled on: Wednesday, July 3 Report to the Registration Desk on the 1st floor of the [facility] To find out your arrival time, please call [phone] between 1PM - 3PM on: Tuesday, July 2 If your arrival time is 6:00 am, do not arrive before that time as the [facility] entrance doors do not open until 6:00 am.  REMEMBER: Instructions that are not followed completely may result in serious medical risk, up to and including death; or upon the discretion of your surgeon and anesthesiologist your surgery may need to be rescheduled.  Do not eat food after midnight the night before surgery.  No gum chewing or hard candies.  You may however, drink water up to 2 hours before you are scheduled to arrive for your surgery. Do not drink anything within 2 hours of your scheduled arrival time.  In addition, your doctor has ordered for you to drink the provided:  Gatorade G2 Drinking this carbohydrate drink up to two hours before surgery helps to reduce insulin resistance and improve patient outcomes. Please complete drinking 2 hours before scheduled arrival time.  One week prior to surgery: starting June 26 Stop aspirin and Anti-inflammatories (NSAIDS) such as Advil, Aleve, Ibuprofen, Motrin, Naproxen, Naprosyn and Aspirin based products such as Excedrin, Goody's Powder, BC Powder. Stop ANY OVER THE COUNTER supplements until after surgery. Stop Gluco Switch blood sugar support, Cerebra brain health, multiple vitamins You may however, continue to take Tylenol if needed for pain up until the day of surgery.  Continue taking all prescribed medications with the exception of the following:   TAKE ONLY THESE MEDICATIONS THE MORNING OF SURGERY WITH A SIP OF WATER:  Amlodipine Albuterol inhaler Levothyroxine Pantoprazole - (take one the night before and one on the morning of surgery - helps to prevent nausea after surgery.) Rosuvastatin (crestor)  Use inhalers on the day of  surgery and bring to the hospital.  No Alcohol for 24 hours before or after surgery.  No Smoking including e-cigarettes for 24 hours before surgery.  No chewable tobacco products for at least 6 hours before surgery.  No nicotine patches on the day of surgery.  On the morning of surgery brush your teeth with toothpaste and water, you may rinse your mouth with mouthwash if you wish. Do not swallow any toothpaste or mouthwash.  Use CHG Soap as directed on instruction sheet.  Do not wear jewelry, make-up, hairpins, clips or nail polish.  Do not wear lotions, powders, or perfumes.   Do not shave body hair from the neck down 48 hours before surgery.  Contact lenses, hearing aids and dentures may not be worn into surgery.  Do not bring valuables to the hospital. [facility] is not responsible for any missing/lost belongings or valuables.   Notify your doctor if there is any change in your medical condition (cold, fever, infection).  Wear comfortable clothing (specific to your surgery type) to the hospital.  After surgery, you can help prevent lung complications by doing breathing exercises.  Take deep breaths and cough every 1-2 hours. Your doctor may order a device called an Incentive Spirometer to help you take deep breaths.  If you are being admitted to the hospital overnight, leave your suitcase in the car. After surgery it may be brought to your room.  In case of increased patient census, it may be necessary for you, the patient, to continue your postoperative care in the Same Day Surgery department.  If you are being  discharged the day of surgery, you will not be allowed to drive home. You will need a responsible individual to drive you home and stay with you for 24 hours after surgery.   If you are taking public transportation, you will need to have a responsible individual with you.  Please call the Pre-admissions Testing Dept. at [phone] if you have any questions about  these instructions.  Surgery Visitation Policy:  Patients having surgery or a procedure may have two visitors.  Children under the age of 84 must have an adult with them who is not the patient.  Inpatient Visitation:    Visiting hours are 7 a.m. to 8 p.m. Up to four visitors are allowed at one time in a patient room. The visitors may rotate out with other people during the day.  One visitor age 94 or older may stay with the patient overnight and must be in the room by 8 p.m.    Pre-operative 5 CHG Bath Instructions   You can play a key role in reducing the risk of infection after surgery. Your skin needs to be as free of germs as possible. You can reduce the number of germs on your skin by washing with CHG (chlorhexidine gluconate) soap before surgery. CHG is an antiseptic soap that kills germs and continues to kill germs even after washing.   DO NOT use if you have an allergy to chlorhexidine/CHG or antibacterial soaps. If your skin becomes reddened or irritated, stop using the CHG and notify one of our RNs at [phone].   Please shower with the CHG soap starting 4 days before surgery using the following schedule:     Please keep in mind the following:  DO NOT shave, including legs and underarms, starting the day of your first shower.   You may shave your face at any point before/day of surgery.  Place clean sheets on your bed the day you start using CHG soap. Use a clean washcloth (not used since being washed) for each shower. DO NOT sleep with pets once you start using the CHG.   CHG Shower Instructions:  If you choose to wash your hair and private area, wash first with your normal shampoo/soap.  After you use shampoo/soap, rinse your hair and body thoroughly to remove shampoo/soap residue.  Turn the water OFF and apply about 3 tablespoons (45 ml) of CHG soap to a CLEAN washcloth.  Apply CHG soap ONLY FROM YOUR NECK DOWN TO YOUR TOES (washing for 3-5 minutes)  DO NOT use  CHG soap on face, private areas, open wounds, or sores.  Pay special attention to the area where your surgery is being performed.  If you are having back surgery, having someone wash your back for you may be helpful. Wait 2 minutes after CHG soap is applied, then you may rinse off the CHG soap.  Pat dry with a clean towel  Put on clean clothes/pajamas   If you choose to wear lotion, please use ONLY the CHG-compatible lotions on the back of this paper.     Additional instructions for the day of surgery: DO NOT APPLY any lotions, deodorants, cologne, or perfumes.   Put on clean/comfortable clothes.  Brush your teeth.  Ask your nurse before applying any prescription medications to the skin.      CHG Compatible Lotions   Aveeno Moisturizing lotion  Cetaphil Moisturizing Cream  Cetaphil Moisturizing Lotion  Clairol Herbal Essence Moisturizing Lotion, Dry Skin  Clairol Herbal Essence Moisturizing Lotion,  Extra Dry Skin  Clairol Herbal Essence Moisturizing Lotion, Normal Skin  Curel Age Defying Therapeutic Moisturizing Lotion with Alpha Hydroxy  Curel Extreme Care Body Lotion  Curel Soothing Hands Moisturizing Hand Lotion  Curel Therapeutic Moisturizing Cream, Fragrance-Free  Curel Therapeutic Moisturizing Lotion, Fragrance-Free  Curel Therapeutic Moisturizing Lotion, Original Formula  Eucerin Daily Replenishing Lotion  Eucerin Dry Skin Therapy Plus Alpha Hydroxy Crme  Eucerin Dry Skin Therapy Plus Alpha Hydroxy Lotion  Eucerin Original Crme  Eucerin Original Lotion  Eucerin Plus Crme Eucerin Plus Lotion  Eucerin TriLipid Replenishing Lotion  Keri Anti-Bacterial Hand Lotion  Keri Deep Conditioning Original Lotion Dry Skin Formula Softly Scented  Keri Deep Conditioning Original Lotion, Fragrance Free Sensitive Skin Formula  Keri Lotion Fast Absorbing Fragrance Free Sensitive Skin Formula  Keri Lotion Fast Absorbing Softly Scented Dry Skin Formula  Keri Original Lotion  Keri  Skin Renewal Lotion Keri Silky Smooth Lotion  Keri Silky Smooth Sensitive Skin Lotion  Nivea Body Creamy Conditioning Oil  Nivea Body Extra Enriched Lotion  Nivea Body Original Lotion  Nivea Body Sheer Moisturizing Lotion Nivea Crme  Nivea Skin Firming Lotion  NutraDerm 30 Skin Lotion  NutraDerm Skin Lotion  NutraDerm Therapeutic Skin Cream  NutraDerm Therapeutic Skin Lotion  ProShield Protective Hand Cream  Provon moisturizing lotion  Preoperative Educational Videos for Total Hip, Knee and Shoulder Replacements  To better prepare for surgery, please view our videos that explain the physical activity and discharge planning required to have the best surgical recovery at [facility].  [email or web address]  Questions? Call [phone] or email [email or web address]

## 2022-06-30 ENCOUNTER — Encounter: Payer: Self-pay | Admitting: Internal Medicine

## 2022-06-30 ENCOUNTER — Ambulatory Visit (INDEPENDENT_AMBULATORY_CARE_PROVIDER_SITE_OTHER): Payer: PPO | Admitting: Internal Medicine

## 2022-06-30 VITALS — BP 128/64 | HR 65 | Temp 98.2°F | Ht 63.0 in | Wt 180.6 lb

## 2022-06-30 DIAGNOSIS — E1065 Type 1 diabetes mellitus with hyperglycemia: Secondary | ICD-10-CM | POA: Diagnosis not present

## 2022-06-30 DIAGNOSIS — E038 Other specified hypothyroidism: Secondary | ICD-10-CM

## 2022-06-30 DIAGNOSIS — N182 Chronic kidney disease, stage 2 (mild): Secondary | ICD-10-CM

## 2022-06-30 DIAGNOSIS — I10 Essential (primary) hypertension: Secondary | ICD-10-CM

## 2022-06-30 DIAGNOSIS — D649 Anemia, unspecified: Secondary | ICD-10-CM | POA: Diagnosis not present

## 2022-06-30 DIAGNOSIS — I779 Disorder of arteries and arterioles, unspecified: Secondary | ICD-10-CM

## 2022-06-30 DIAGNOSIS — E7800 Pure hypercholesterolemia, unspecified: Secondary | ICD-10-CM

## 2022-06-30 DIAGNOSIS — K219 Gastro-esophageal reflux disease without esophagitis: Secondary | ICD-10-CM

## 2022-06-30 DIAGNOSIS — K5900 Constipation, unspecified: Secondary | ICD-10-CM | POA: Diagnosis not present

## 2022-06-30 DIAGNOSIS — Z01818 Encounter for other preprocedural examination: Secondary | ICD-10-CM | POA: Diagnosis not present

## 2022-06-30 LAB — URINE CULTURE: Culture: >=100000 — AB

## 2022-06-30 LAB — HEMOGLOBIN A1C
Hgb A1c MFr Bld: 7.4 % — ABNORMAL HIGH (ref 4.8–5.6)
Mean Plasma Glucose: 166 mg/dL

## 2022-06-30 NOTE — Progress Notes (Unsigned)
Subjective:    Patient ID: Marissa Huff, female    DOB: [birth year], 89 y.o.   MRN: [id number]  Patient here for  Chief Complaint  Patient presents with   Pre-op Exam    HPI Here for pre op evaluation.  She is accompanied by her husband.  History obtained from both of them.  Scheduled to have total knee arthroplasty 07/06/22.  She reports having increased knee pain.  Does limit her activity.  She reports otherwise she feels she is doing relatively well.  Sugars are better. Recent A1c improved to 7.4.  no problems with low sugars.  She denies any chest pain.  Breathing is stable.  She is able to wash dishes, wash clothes and walk around her house with no chest pain or sob.  Denies any acid reflux, nausea or vomiting.  No bowel change.  Had pre op through hospital with EKG and labs.     Past Medical History:  Diagnosis Date   Allergy    Arthritis    Carotid artery disease (HCC)    Carpal tunnel syndrome, bilateral    Chronic kidney disease, stage 2, mildly decreased GFR    Diabetes mellitus without complication (HCC)    Diverticulosis    Gastric lymphoma (HCC)    Gastroparesis    GERD (gastroesophageal reflux disease)    History of kidney stones    Hyperlipidemia    Hypertension associated with diabetes (HCC)    Hypothyroidism    Malignant lymphoma (HCC)    gastric   PUD (peptic ulcer disease)    Skin cancer    Spinal stenosis    lumbar laminectomy   Type 1 diabetes mellitus (HCC)    Urinary incontinence    Past Surgical History:  Procedure Laterality Date   antrectomy and vagotomy  1994   CARPAL TUNNEL RELEASE Left 10/24/2008   CARPAL TUNNEL RELEASE Right 06/19/2009   CATARACT EXTRACTION, BILATERAL Bilateral 2014   COLONOSCOPY  05/24/2007   EYE SURGERY     bilateral cataracts   gastric lymphoma  1996   surgery   KNEE ARTHROSCOPY Left 12/05/2008   KNEE ARTHROSCOPY Right 06/16/2010   LAPAROSCOPIC CHOLECYSTECTOMY  06/1999   LUMBAR LAMINECTOMY  1999   LUMBAR  LAMINECTOMY WITH COFLEX 1 LEVEL N/A 09/15/2015   Procedure: Lumbar one-two Laminectomy with placement of coflex;  Surgeon: Barnett Abu, MD;  Location: [facility];  Service: Neurosurgery;  Laterality: N/A;   PARTIAL HYSTERECTOMY  02/1978   fibroid tumors, ovaries not removed   TONSILLECTOMY AND ADENOIDECTOMY  1982   TRIGGER FINGER RELEASE Right 06/19/2009   Family History  Problem Relation Age of Onset   Stroke Mother    Stroke Father    Hypertension Father    Diabetes Brother    Multiple sclerosis Brother    Breast cancer Daughter 78   Colon cancer Neg Hx    Social History   Socioeconomic History   Marital status: Married    Spouse name: Leonette Most   Number of children: 2   Years of education: Not on file   Highest education level: Not on file  Occupational History   Not on file  Tobacco Use   Smoking status: Never   Smokeless tobacco: Never  Vaping Use   Vaping Use: Never used  Substance and Sexual Activity   Alcohol use: No    Alcohol/week: 0.0 standard drinks of alcohol   Drug use: No   Sexual activity: Not Currently    Birth control/protection:  Surgical  Other Topics Concern   Not on file  Social History Narrative   Not on file   Social Determinants of Health   Financial Resource Strain: Low Risk  (01/28/2021)   Overall Financial Resource Strain (CARDIA)    Difficulty of Paying Living Expenses: Not hard at all  Food Insecurity: No Food Insecurity (01/28/2021)   Hunger Vital Sign    Worried About Running Out of Food in the Last Year: Never true    Ran Out of Food in the Last Year: Never true  Transportation Needs: No Transportation Needs (01/28/2021)   PRAPARE - Administrator, Civil Service (Medical): No    Lack of Transportation (Non-Medical): No  Physical Activity: Unknown (01/19/2018)   Exercise Vital Sign    Days of Exercise per Week: 0 days    Minutes of Exercise per Session: Not on file  Stress: No Stress Concern Present (01/28/2021)    [employer] of Occupational Health - Occupational Stress Questionnaire    Feeling of Stress : Not at all  Social Connections: Socially Integrated (01/28/2021)   Social Connection and Isolation Panel [NHANES]    Frequency of Communication with Friends and Family: More than three times a week    Frequency of Social Gatherings with Friends and Family: More than three times a week    Attends Religious Services: More than 4 times per year    Active Member of [employer] or Organizations: Yes    Attends Banker Meetings: Not on file    Marital Status: Married     Review of Systems  Constitutional:  Negative for appetite change and unexpected weight change.  HENT:  Negative for congestion and sinus pressure.   Respiratory:  Negative for cough and chest tightness.        No increased sob.  Breathing is stable.   Cardiovascular:  Negative for chest pain, palpitations and leg swelling.  Gastrointestinal:  Negative for abdominal pain, diarrhea, nausea and vomiting.  Genitourinary:  Negative for difficulty urinating and dysuria.  Musculoskeletal:  Negative for myalgias.       Knee pain as outlined.    Skin:  Negative for color change and rash.  Neurological:  Negative for dizziness and headaches.  Psychiatric/Behavioral:  Negative for agitation and dysphoric mood.        Objective:     BP 128/64   Pulse 65   Temp 98.2 F (36.8 C) (Oral)   Ht 5\' 3"  (1.6 m)   Wt 180 lb 9.6 oz (81.9 kg)   SpO2 98%   BMI 31.99 kg/m  Wt Readings from Last 3 Encounters:  06/30/22 180 lb 9.6 oz (81.9 kg)  06/28/22 178 lb (80.7 kg)  05/16/22 181 lb 9.6 oz (82.4 kg)    Physical Exam Vitals reviewed.  Constitutional:      General: She is not in acute distress.    Appearance: Normal appearance.  HENT:     Head: Normocephalic and atraumatic.     Right Ear: External ear normal.     Left Ear: External ear normal.  Eyes:     General: No scleral icterus.       Right eye: No discharge.         Left eye: No discharge.     Conjunctiva/sclera: Conjunctivae normal.  Neck:     Thyroid: No thyromegaly.  Cardiovascular:     Rate and Rhythm: Normal rate and regular rhythm.  Pulmonary:     Effort: No respiratory  distress.     Breath sounds: Normal breath sounds. No wheezing.  Abdominal:     General: Bowel sounds are normal.     Palpations: Abdomen is soft.     Tenderness: There is no abdominal tenderness.  Musculoskeletal:        General: No swelling or tenderness.     Cervical back: Neck supple. No tenderness.  Lymphadenopathy:     Cervical: No cervical adenopathy.  Skin:    Findings: No erythema or rash.  Neurological:     Mental Status: She is alert.  Psychiatric:        Mood and Affect: Mood normal.        Behavior: Behavior normal.      Outpatient Encounter Medications as of 06/30/2022  Medication Sig   acetaminophen (TYLENOL) 650 MG CR tablet Take 650 mg by mouth every 8 (eight) hours as needed for pain.   albuterol (VENTOLIN HFA) 108 (90 Base) MCG/ACT inhaler TAKE 2 PUFFS BY MOUTH EVERY 6 HOURS AS NEEDED FOR WHEEZE OR SHORTNESS OF BREATH   amLODipine (NORVASC) 5 MG tablet TAKE 1 TABLET (5 MG TOTAL) BY MOUTH DAILY.   clobetasol (TEMOVATE) 0.05 % external solution Apply 1 Application topically 2 (two) times daily.   Continuous Blood Gluc Receiver (FREESTYLE LIBRE 14 DAY READER) DEVI Use 1 each as needed .  Use to test blood sugar   Continuous Blood Gluc Sensor (FREESTYLE LIBRE 14 DAY SENSOR) MISC Use 1 each every 14 (fourteen) days   dextromethorphan-guaiFENesin (MUCINEX DM) 30-600 MG 12hr tablet Take 1 tablet by mouth daily as needed for cough.   diphenhydrAMINE (BENADRYL) 25 MG tablet Take 25 mg by mouth at bedtime as needed.   fexofenadine (ALLEGRA) 180 MG tablet Take 180 mg by mouth daily.   fluticasone (FLONASE) 50 MCG/ACT nasal spray SPRAY 2 SPRAYS INTO EACH NOSTRIL EVERY DAY (Patient taking differently: Place 1 spray into both nostrils at bedtime. SPRAY 2 SPRAYS  INTO EACH NOSTRIL EVERY DAY)   glucagon (GLUCAGON EMERGENCY) 1 MG injection Inject 1 mg into the muscle once as needed.   insulin aspart (NOVOLOG FLEXPEN) 100 UNIT/ML FlexPen Inject 4-13 Units into the skin 3 (three) times daily with meals. Sliding scale   ketoconazole (NIZORAL) 2 % shampoo Apply 1 Application topically 3 (three) times a week.   LANTUS SOLOSTAR 100 UNIT/ML Solostar Pen Inject 20-26 Units into the skin at bedtime. Sliding scale   levothyroxine (SYNTHROID) 200 MCG tablet TAKE 1 TABLET (200 MCG TOTAL) BY MOUTH DAILY BEFORE BREAKFAST.   Olopatadine HCl (PATADAY) 0.7 % SOLN Apply 1 drop to eye daily as needed.   OVER THE COUNTER MEDICATION Take 2 capsules by mouth daily. Cerebra Brain Health   OVER THE COUNTER MEDICATION Take 2 capsules by mouth daily as needed. Gluco Switch Blood sugar support   pantoprazole (PROTONIX) 40 MG tablet TAKE 1 TABLET BY MOUTH TWICE A DAY   Polyethyl Glycol-Propyl Glycol (SYSTANE OP) Place 1 drop into both eyes at bedtime.   Polyethylene Glycol 3350 (MIRALAX PO) Take 1 Dose by mouth daily as needed.   rosuvastatin (CRESTOR) 5 MG tablet TAKE 1 TABLET BY MOUTH THREE TIMES A WEEK. (Patient taking differently: Take 5 mg by mouth 3 (three) times a week. MONDAY, WEDNESDAY, FRIDAY)   traMADol (ULTRAM) 50 MG tablet Take 50 mg by mouth 2 (two) times daily as needed.   valsartan-hydrochlorothiazide (DIOVAN-HCT) 160-12.5 MG tablet TAKE 1 TABLET BY MOUTH EVERY DAY   Wheat Dextrin (BENEFIBER) POWD Take 1 Dose  by mouth daily as needed.   aspirin 81 MG tablet Take 81 mg by mouth daily. (Patient not taking: Reported on 06/30/2022)   MULTIPLE VITAMINS-MINERALS PO Take 1 tablet by mouth daily. (Patient not taking: Reported on 06/30/2022)   No facility-administered encounter medications on file as of 06/30/2022.     Lab Results  Component Value Date   WBC 5.8 06/28/2022   HGB 11.6 (L) 06/28/2022   HCT 35.1 (L) 06/28/2022   PLT 226 06/28/2022   GLUCOSE 189 (H) 06/28/2022    CHOL 114 02/25/2022   TRIG 59.0 02/25/2022   HDL 52.20 02/25/2022   LDLCALC 50 02/25/2022   ALT 16 06/28/2022   AST 21 06/28/2022   NA 136 06/28/2022   K 3.5 06/28/2022   CL 103 06/28/2022   CREATININE 0.79 06/28/2022   BUN 21 06/28/2022   CO2 23 06/28/2022   TSH 0.92 03/02/2022   HGBA1C 7.4 (H) 06/28/2022   MICROALBUR 2.3 (H) 07/16/2021    No results found.     Assessment & Plan:  Anemia, unspecified type Assessment & Plan: On pre op testing- hgb 11.6.  recommend to follow cbc post op.     Carotid artery disease, unspecified laterality (HCC) Assessment & Plan: Carotid duplex 02/2022 reveals some progression on the right side with common carotid artery stenosis that now appears to be 50% or greater and internal carotid artery stenosis that remains in the 1 to 39% range on the right.  The left internal carotid artery at the 1 to 39% range and stable from previous studies.  No role for intervention in an asymptomatic 89 year old.  Continue current medical regimen which includes Crestor and aspirin.  Recheck in 1 year. If off aspirin prior to surgery.  Recommend restarting post op.    CKD (chronic kidney disease) stage 2, GFR 60-89 ml/min Assessment & Plan: Avoid antiinflammatories.  Follow metabolic panel.    Constipation, unspecified constipation type Assessment & Plan: Reports bowels are moving.     Gastroesophageal reflux disease, unspecified whether esophagitis present Assessment & Plan: Continue on protonix.     Hypercholesterolemia Assessment & Plan: Continue crestor.  Low cholesterol diet and exercise.  Follow lipid panel and liver function tests.   Lab Results  Component Value Date   CHOL 114 02/25/2022   HDL 52.20 02/25/2022   LDLCALC 50 02/25/2022   TRIG 59.0 02/25/2022   CHOLHDL 2 02/25/2022     Primary hypertension Assessment & Plan: Currently on valsartan/hctz and amlodipine.  Will need close intra op and post op monitoring of her heart rate  and blood pressure to avoid extremes.  Pre op potassium and kidney function wnl.    Other specified hypothyroidism Assessment & Plan: On synthroid.  Follow tsh.    Type 1 diabetes mellitus with hyperglycemia (HCC) Assessment & Plan: Followed by endocrinology.  Currently on lantus and uses novolog SSI.  Discussed insulin dosing and recommendations.  Discussed holding short acting insulin.  Contact Dr Gershon Crane for recommendations regarding insulin prior to surgery.  Received call - per Dr Gershon Crane - take 20 units of lantus night before surgery.     Pre-op evaluation Assessment & Plan: She is able to walk around her house, do dishes and wash clothes, etc without chest pain or sob.  Had pre op labs through [facility] and A1c is 7.4.  EKG - SR with fusion beats.  No acute ischemic changes.  Discussed given no symptoms with her activity as outlined, I feel she is at low  risk from a cardiac standpoint to proceed with planned surgery.  Will need close intra op and post op monitoring of heart rate and blood pressure to avoid extremes.  Also, see below for recommendations for insulin adjustment.  Close peri op monitoring of sugars.        Dale [place], MD

## 2022-06-30 NOTE — Progress Notes (Signed)
  [facility] Perioperative Services: Pre-Admission/Anesthesia Testing  Abnormal Lab Notification   Date: 06/30/22  Name: Marissa Huff MRN:   [id number]  Re: Abnormal labs noted during PAT appointment   Notified:  Provider Name Provider Role Notification Mode  Francesco Sor, MD Orthopedics (Surgeon) Routed and/or faxed via [facility]   Abnormal Lab Value(s):   Lab Results  Component Value Date   COLORURINE YELLOW (A) 06/28/2022   APPEARANCEUR HAZY (A) 06/28/2022   LABSPEC 1.017 06/28/2022   PHURINE 5.0 06/28/2022   GLUCOSEU NEGATIVE 06/28/2022   HGBUR NEGATIVE 06/28/2022   BILIRUBINUR NEGATIVE 06/28/2022   KETONESUR 5 (A) 06/28/2022   PROTEINUR NEGATIVE 06/28/2022   UROBILINOGEN 0.2 08/22/2016   NITRITE POSITIVE (A) 06/28/2022   LEUKOCYTESUR MODERATE (A) 06/28/2022   EPIU 6-10 06/28/2022   WBCU >50 06/28/2022   RBCU 0-5 06/28/2022   BACTERIA MANY (A) 06/28/2022   CULT (A) 06/28/2022    >=100,000 COLONIES/mL GRAM NEGATIVE RODS CULTURE REINCUBATED FOR BETTER GROWTH SUSCEPTIBILITIES TO FOLLOW Performed at [facility], [address], [place], [place] [address]     Clinical Information and Notes:  Patient is scheduled for COMPUTER ASSISTED TOTAL KNEE ARTHROPLASTY (Left: Knee) on 07/06/2022.    UA performed in PAT consistent with/concerning for infection.  No leukocytosis noted on CBC; WBC 5800 Renal function: Estimated Creatinine Clearance: 47.9 mL/min (by C-G formula based on SCr of 0.79 mg/dL). Urine C&S added to assess for pathogenically significant growth.  Impression and Plan:  Marissa Huff with a UA that was (+) for infection; reflex culture sent. Preliminary culture (+) for significant GNR colony count; final pathogen ID and susceptibilities pending. Will plan on forwarding final culture results to MD as they become available to me. Sending results for review and consideration of preoperative treatment as deemed appropriate by Dr.  Ernest Pine.   Quentin Mulling, MSN, APRN, FNP-C, CEN [facility]  Peri-operative Services Nurse Practitioner Phone: [phone] Fax: [phone] 06/30/22 8:06 AM  NOTE: This note has been prepared using Dragon dictation software. Despite my best ability to proofread, there is always the potential that unintentional transcriptional errors may still occur from this process.

## 2022-07-01 ENCOUNTER — Other Ambulatory Visit: Payer: PPO

## 2022-07-01 LAB — URINE CULTURE

## 2022-07-01 LAB — IGE: IgE (Immunoglobulin E), Serum: 10 IU/mL (ref 6–495)

## 2022-07-03 ENCOUNTER — Encounter: Payer: Self-pay | Admitting: Orthopedic Surgery

## 2022-07-04 ENCOUNTER — Encounter: Payer: Self-pay | Admitting: Internal Medicine

## 2022-07-04 DIAGNOSIS — Z01818 Encounter for other preprocedural examination: Secondary | ICD-10-CM | POA: Insufficient documentation

## 2022-07-04 NOTE — Assessment & Plan Note (Signed)
Followed by endocrinology.  Currently on lantus and uses novolog SSI.  Discussed insulin dosing and recommendations.  Discussed holding short acting insulin.  Contact Dr Gershon Crane for recommendations regarding insulin prior to surgery.  Received call - per Dr Gershon Crane - take 20 units of lantus night before surgery.

## 2022-07-04 NOTE — Assessment & Plan Note (Signed)
Carotid duplex 02/2022 reveals some progression on the right side with common carotid artery stenosis that now appears to be 50% or greater and internal carotid artery stenosis that remains in the 1 to 39% range on the right.  The left internal carotid artery at the 1 to 39% range and stable from previous studies.  No role for intervention in an asymptomatic 89 year old.  Continue current medical regimen which includes Crestor and aspirin.  Recheck in 1 year. If off aspirin prior to surgery.  Recommend restarting post op.

## 2022-07-04 NOTE — Assessment & Plan Note (Signed)
On synthroid.  Follow tsh.   

## 2022-07-04 NOTE — Assessment & Plan Note (Signed)
Currently on valsartan/hctz and amlodipine.  Will need close intra op and post op monitoring of her heart rate and blood pressure to avoid extremes.  Pre op potassium and kidney function wnl.

## 2022-07-04 NOTE — Assessment & Plan Note (Signed)
Continue on protonix.   

## 2022-07-04 NOTE — Assessment & Plan Note (Signed)
Continue crestor.  Low cholesterol diet and exercise.  Follow lipid panel and liver function tests.   Lab Results  Component Value Date   CHOL 114 02/25/2022   HDL 52.20 02/25/2022   LDLCALC 50 02/25/2022   TRIG 59.0 02/25/2022   CHOLHDL 2 02/25/2022   

## 2022-07-04 NOTE — Assessment & Plan Note (Signed)
Reports bowels are moving.

## 2022-07-04 NOTE — Assessment & Plan Note (Addendum)
She is able to walk around her house, do dishes and wash clothes, etc without chest pain or sob.  Had pre op labs through [facility] and A1c is 7.4.  EKG - SR with fusion beats.  No acute ischemic changes.  Discussed given no symptoms with her activity as outlined, I feel she is at low risk from a cardiac standpoint to proceed with planned surgery.  Will need close intra op and post op monitoring of heart rate and blood pressure to avoid extremes.  Also, see below for recommendations for insulin adjustment.  Close peri op monitoring of sugars.

## 2022-07-04 NOTE — Assessment & Plan Note (Signed)
Avoid antiinflammatories.  Follow metabolic panel.  

## 2022-07-04 NOTE — Assessment & Plan Note (Signed)
On pre op testing- hgb 11.6.  recommend to follow cbc post op.

## 2022-07-05 MED ORDER — TRANEXAMIC ACID-NACL 1000-0.7 MG/100ML-% IV SOLN
1000.0000 mg | INTRAVENOUS | Status: AC
  Administered 2022-07-06: 1000 mg via INTRAVENOUS

## 2022-07-05 MED ORDER — ORAL CARE MOUTH RINSE: 15.0000 mL | Freq: Once | OROMUCOSAL | Status: AC

## 2022-07-05 MED ORDER — DEXAMETHASONE SODIUM PHOSPHATE 10 MG/ML IJ SOLN
8.0000 mg | Freq: Once | INTRAMUSCULAR | Status: AC
  Administered 2022-07-06: 8 mg via INTRAVENOUS

## 2022-07-05 MED ORDER — CHLORHEXIDINE GLUCONATE 0.12 % MT SOLN
15.0000 mL | Freq: Once | OROMUCOSAL | Status: AC
  Administered 2022-07-06: 15 mL via OROMUCOSAL

## 2022-07-05 MED ORDER — CELECOXIB 200 MG PO CAPS
400.0000 mg | ORAL_CAPSULE | Freq: Once | ORAL | Status: AC
  Administered 2022-07-06: 400 mg via ORAL

## 2022-07-05 MED ORDER — CEFAZOLIN SODIUM-DEXTROSE 2-4 GM/100ML-% IV SOLN
2.0000 g | INTRAVENOUS | Status: AC
  Administered 2022-07-06: 2 g via INTRAVENOUS

## 2022-07-05 MED ORDER — GABAPENTIN 300 MG PO CAPS
300.0000 mg | ORAL_CAPSULE | Freq: Once | ORAL | Status: AC
  Administered 2022-07-06: 300 mg via ORAL

## 2022-07-05 MED ORDER — SODIUM CHLORIDE 0.9 % IV SOLN: INTRAVENOUS | Status: DC

## 2022-07-05 MED ORDER — CHLORHEXIDINE GLUCONATE 4 % EX SOLN: 60.0000 mL | Freq: Once | CUTANEOUS | Status: DC

## 2022-07-06 ENCOUNTER — Observation Stay
Admission: RE | Admit: 2022-07-06 | Discharge: 2022-07-07 | Disposition: A | Discharge status: Home Health | Payer: PPO | Attending: Orthopedic Surgery | Admitting: Orthopedic Surgery

## 2022-07-06 ENCOUNTER — Encounter: Payer: Self-pay | Admitting: Orthopedic Surgery

## 2022-07-06 ENCOUNTER — Other Ambulatory Visit: Payer: Self-pay

## 2022-07-06 ENCOUNTER — Ambulatory Visit: Payer: PPO | Admitting: Urgent Care

## 2022-07-06 ENCOUNTER — Observation Stay: Payer: PPO

## 2022-07-06 ENCOUNTER — Encounter
Admission: RE | Disposition: A | Discharge status: Home Health | Payer: Self-pay | Source: Home / Self Care | Attending: Orthopedic Surgery

## 2022-07-06 DIAGNOSIS — K5730 Diverticulosis of large intestine without perforation or abscess without bleeding: Secondary | ICD-10-CM | POA: Insufficient documentation

## 2022-07-06 DIAGNOSIS — Z7984 Long term (current) use of oral hypoglycemic drugs: Secondary | ICD-10-CM | POA: Insufficient documentation

## 2022-07-06 DIAGNOSIS — Z794 Long term (current) use of insulin: Secondary | ICD-10-CM | POA: Insufficient documentation

## 2022-07-06 DIAGNOSIS — Z85828 Personal history of other malignant neoplasm of skin: Secondary | ICD-10-CM | POA: Insufficient documentation

## 2022-07-06 DIAGNOSIS — E039 Hypothyroidism, unspecified: Secondary | ICD-10-CM | POA: Diagnosis not present

## 2022-07-06 DIAGNOSIS — E1122 Type 2 diabetes mellitus with diabetic chronic kidney disease: Secondary | ICD-10-CM | POA: Diagnosis not present

## 2022-07-06 DIAGNOSIS — Z79899 Other long term (current) drug therapy: Secondary | ICD-10-CM | POA: Diagnosis not present

## 2022-07-06 DIAGNOSIS — Z96652 Presence of left artificial knee joint: Secondary | ICD-10-CM | POA: Diagnosis not present

## 2022-07-06 DIAGNOSIS — M1712 Unilateral primary osteoarthritis, left knee: Principal | ICD-10-CM | POA: Insufficient documentation

## 2022-07-06 DIAGNOSIS — N3946 Mixed incontinence: Secondary | ICD-10-CM

## 2022-07-06 DIAGNOSIS — I129 Hypertensive chronic kidney disease with stage 1 through stage 4 chronic kidney disease, or unspecified chronic kidney disease: Secondary | ICD-10-CM | POA: Diagnosis not present

## 2022-07-06 DIAGNOSIS — D649 Anemia, unspecified: Secondary | ICD-10-CM

## 2022-07-06 DIAGNOSIS — N182 Chronic kidney disease, stage 2 (mild): Secondary | ICD-10-CM | POA: Insufficient documentation

## 2022-07-06 DIAGNOSIS — Z96659 Presence of unspecified artificial knee joint: Secondary | ICD-10-CM

## 2022-07-06 DIAGNOSIS — Z880 Allergy status to penicillin: Secondary | ICD-10-CM

## 2022-07-06 DIAGNOSIS — E1065 Type 1 diabetes mellitus with hyperglycemia: Secondary | ICD-10-CM

## 2022-07-06 DIAGNOSIS — Z01812 Encounter for preprocedural laboratory examination: Secondary | ICD-10-CM

## 2022-07-06 DIAGNOSIS — C8599 Non-Hodgkin lymphoma, unspecified, extranodal and solid organ sites: Secondary | ICD-10-CM

## 2022-07-06 DIAGNOSIS — Z471 Aftercare following joint replacement surgery: Secondary | ICD-10-CM | POA: Diagnosis not present

## 2022-07-06 HISTORY — PX: KNEE ARTHROPLASTY: SHX992

## 2022-07-06 LAB — GLUCOSE, CAPILLARY
Glucose-Capillary: 168 mg/dL — ABNORMAL HIGH (ref 70–99)
Glucose-Capillary: 236 mg/dL — ABNORMAL HIGH (ref 70–99)
Glucose-Capillary: 310 mg/dL — ABNORMAL HIGH (ref 70–99)

## 2022-07-06 LAB — ABO/RH: ABO/RH(D): O NEG

## 2022-07-06 SURGERY — ARTHROPLASTY, KNEE, TOTAL, USING IMAGELESS COMPUTER-ASSISTED NAVIGATION
Anesthesia: Spinal | Site: Knee | Laterality: Left

## 2022-07-06 MED ORDER — TRANEXAMIC ACID-NACL 1000-0.7 MG/100ML-% IV SOLN
INTRAVENOUS | Status: AC
  Filled 2022-07-06: qty 100

## 2022-07-06 MED ORDER — INSULIN ASPART 100 UNIT/ML IJ SOLN
INTRAMUSCULAR | Status: AC
  Filled 2022-07-06: qty 1

## 2022-07-06 MED ORDER — OLOPATADINE HCL 0.1 % OP SOLN: 1.0000 [drp] | Freq: Every day | OPHTHALMIC | Status: DC | PRN

## 2022-07-06 MED ORDER — ACETAMINOPHEN 325 MG PO TABS: 325.0000 mg | ORAL_TABLET | Freq: Four times a day (QID) | ORAL | Status: DC | PRN

## 2022-07-06 MED ORDER — SODIUM CHLORIDE 0.9 % IV SOLN: INTRAVENOUS | Status: DC

## 2022-07-06 MED ORDER — INSULIN GLARGINE-YFGN 100 UNIT/ML ~~LOC~~ SOLN
20.0000 [IU] | Freq: Every day | SUBCUTANEOUS | Status: DC
  Administered 2022-07-06: 20 [IU] via SUBCUTANEOUS
  Filled 2022-07-06 (×2): qty 0.2

## 2022-07-06 MED ORDER — ARTIFICIAL TEARS OPHTHALMIC OINT
TOPICAL_OINTMENT | Freq: Every day | OPHTHALMIC | Status: DC
  Filled 2022-07-06: qty 3.5

## 2022-07-06 MED ORDER — ALBUTEROL SULFATE HFA 108 (90 BASE) MCG/ACT IN AERS: 2.0000 | INHALATION_SPRAY | Freq: Four times a day (QID) | RESPIRATORY_TRACT | Status: DC | PRN

## 2022-07-06 MED ORDER — INSULIN ASPART 100 UNIT/ML IJ SOLN
0.0000 [IU] | Freq: Three times a day (TID) | INTRAMUSCULAR | Status: DC
  Administered 2022-07-06: 3 [IU] via SUBCUTANEOUS
  Administered 2022-07-07: 7 [IU] via SUBCUTANEOUS
  Filled 2022-07-06: qty 1

## 2022-07-06 MED ORDER — OXYCODONE HCL 5 MG/5ML PO SOLN: 5.0000 mg | Freq: Once | ORAL | Status: DC | PRN

## 2022-07-06 MED ORDER — OXYCODONE HCL 5 MG PO TABS: 5.0000 mg | ORAL_TABLET | Freq: Once | ORAL | Status: DC | PRN

## 2022-07-06 MED ORDER — CELECOXIB 200 MG PO CAPS
200.0000 mg | ORAL_CAPSULE | Freq: Two times a day (BID) | ORAL | Status: DC
  Administered 2022-07-06 – 2022-07-07 (×2): 200 mg via ORAL
  Filled 2022-07-06 (×2): qty 1

## 2022-07-06 MED ORDER — LEVOTHYROXINE SODIUM 50 MCG PO TABS
200.0000 ug | ORAL_TABLET | Freq: Every day | ORAL | Status: DC
  Administered 2022-07-07: 200 ug via ORAL
  Filled 2022-07-06: qty 4

## 2022-07-06 MED ORDER — FENTANYL CITRATE (PF) 100 MCG/2ML IJ SOLN: 25.0000 ug | INTRAMUSCULAR | Status: DC | PRN

## 2022-07-06 MED ORDER — MAGNESIUM HYDROXIDE 400 MG/5ML PO SUSP
30.0000 mL | Freq: Every day | ORAL | Status: DC
  Filled 2022-07-06 (×2): qty 30

## 2022-07-06 MED ORDER — INSULIN ASPART 100 UNIT/ML IJ SOLN
0.0000 [IU] | Freq: Every day | INTRAMUSCULAR | Status: DC
  Administered 2022-07-06: 4 [IU] via SUBCUTANEOUS
  Filled 2022-07-06: qty 1

## 2022-07-06 MED ORDER — ALBUTEROL SULFATE (2.5 MG/3ML) 0.083% IN NEBU: 2.5000 mg | INHALATION_SOLUTION | Freq: Four times a day (QID) | RESPIRATORY_TRACT | Status: DC | PRN

## 2022-07-06 MED ORDER — ROSUVASTATIN CALCIUM 5 MG PO TABS
5.0000 mg | ORAL_TABLET | ORAL | Status: DC
  Administered 2022-07-06: 5 mg via ORAL
  Filled 2022-07-06 (×2): qty 1

## 2022-07-06 MED ORDER — FENTANYL CITRATE (PF) 100 MCG/2ML IJ SOLN
INTRAMUSCULAR | Status: AC
  Filled 2022-07-06: qty 2

## 2022-07-06 MED ORDER — DEXAMETHASONE SODIUM PHOSPHATE 10 MG/ML IJ SOLN
INTRAMUSCULAR | Status: DC | PRN
  Administered 2022-07-06: 5 mg via INTRAVENOUS

## 2022-07-06 MED ORDER — PANTOPRAZOLE SODIUM 40 MG PO TBEC: 40.0000 mg | DELAYED_RELEASE_TABLET | Freq: Two times a day (BID) | ORAL | Status: DC

## 2022-07-06 MED ORDER — AMLODIPINE BESYLATE 5 MG PO TABS
5.0000 mg | ORAL_TABLET | Freq: Every day | ORAL | Status: DC
  Administered 2022-07-06 – 2022-07-07 (×2): 5 mg via ORAL
  Filled 2022-07-06 (×2): qty 1

## 2022-07-06 MED ORDER — FLUTICASONE PROPIONATE 50 MCG/ACT NA SUSP
1.0000 | Freq: Every day | NASAL | Status: DC
  Administered 2022-07-06: 1 via NASAL
  Filled 2022-07-06: qty 16

## 2022-07-06 MED ORDER — TRAMADOL HCL 50 MG PO TABS
50.0000 mg | ORAL_TABLET | ORAL | Status: DC | PRN
  Administered 2022-07-06: 50 mg via ORAL
  Filled 2022-07-06: qty 1

## 2022-07-06 MED ORDER — PROPOFOL 500 MG/50ML IV EMUL
INTRAVENOUS | Status: DC | PRN
  Administered 2022-07-06: 75 ug/kg/min via INTRAVENOUS

## 2022-07-06 MED ORDER — GLYCOPYRROLATE 0.2 MG/ML IJ SOLN
INTRAMUSCULAR | Status: DC | PRN
  Administered 2022-07-06 (×2): .1 mg via INTRAVENOUS

## 2022-07-06 MED ORDER — ACETAMINOPHEN 10 MG/ML IV SOLN
INTRAVENOUS | Status: DC | PRN
  Administered 2022-07-06: 1000 mg via INTRAVENOUS

## 2022-07-06 MED ORDER — ONDANSETRON HCL 4 MG/2ML IJ SOLN: 4.0000 mg | Freq: Four times a day (QID) | INTRAMUSCULAR | Status: DC | PRN

## 2022-07-06 MED ORDER — BISACODYL 10 MG RE SUPP: 10.0000 mg | Freq: Every day | RECTAL | Status: DC | PRN

## 2022-07-06 MED ORDER — VALSARTAN-HYDROCHLOROTHIAZIDE 160-12.5 MG PO TABS: 1.0000 | ORAL_TABLET | Freq: Every day | ORAL | Status: DC

## 2022-07-06 MED ORDER — KETOCONAZOLE 2 % EX SHAM
1.0000 | MEDICATED_SHAMPOO | CUTANEOUS | Status: DC
  Filled 2022-07-06: qty 120

## 2022-07-06 MED ORDER — SURGIPHOR WOUND IRRIGATION SYSTEM - OPTIME: TOPICAL | Status: DC | PRN

## 2022-07-06 MED ORDER — SODIUM CHLORIDE 0.9 % IR SOLN
Status: DC | PRN
  Administered 2022-07-06: 3000 mL

## 2022-07-06 MED ORDER — TRANEXAMIC ACID-NACL 1000-0.7 MG/100ML-% IV SOLN
1000.0000 mg | Freq: Once | INTRAVENOUS | Status: AC
  Administered 2022-07-06: 1000 mg via INTRAVENOUS

## 2022-07-06 MED ORDER — PHENOL 1.4 % MT LIQD
1.0000 | OROMUCOSAL | Status: DC | PRN
  Administered 2022-07-06 – 2022-07-07 (×2): 1 via OROMUCOSAL
  Filled 2022-07-06: qty 177

## 2022-07-06 MED ORDER — HYDROCHLOROTHIAZIDE 12.5 MG PO TABS
12.5000 mg | ORAL_TABLET | Freq: Every day | ORAL | Status: DC
  Administered 2022-07-07: 12.5 mg via ORAL
  Filled 2022-07-06: qty 1

## 2022-07-06 MED ORDER — PROPOFOL 1000 MG/100ML IV EMUL
INTRAVENOUS | Status: AC
  Filled 2022-07-06: qty 100

## 2022-07-06 MED ORDER — LIDOCAINE HCL (CARDIAC) PF 100 MG/5ML IV SOSY
PREFILLED_SYRINGE | INTRAVENOUS | Status: DC | PRN
  Administered 2022-07-06 (×2): 100 mg via INTRAVENOUS

## 2022-07-06 MED ORDER — BUPIVACAINE HCL (PF) 0.5 % IJ SOLN
INTRAMUSCULAR | Status: DC | PRN
  Administered 2022-07-06: 2.6 mL

## 2022-07-06 MED ORDER — CELECOXIB 200 MG PO CAPS
ORAL_CAPSULE | ORAL | Status: AC
  Filled 2022-07-06: qty 2

## 2022-07-06 MED ORDER — PHENYLEPHRINE HCL-NACL 20-0.9 MG/250ML-% IV SOLN
INTRAVENOUS | Status: AC
  Filled 2022-07-06: qty 250

## 2022-07-06 MED ORDER — DEXAMETHASONE SODIUM PHOSPHATE 10 MG/ML IJ SOLN
INTRAMUSCULAR | Status: AC
  Filled 2022-07-06: qty 1

## 2022-07-06 MED ORDER — MENTHOL 3 MG MT LOZG: 1.0000 | LOZENGE | OROMUCOSAL | Status: DC | PRN

## 2022-07-06 MED ORDER — CEFAZOLIN SODIUM-DEXTROSE 2-4 GM/100ML-% IV SOLN
2.0000 g | Freq: Four times a day (QID) | INTRAVENOUS | Status: AC
  Administered 2022-07-06 – 2022-07-07 (×2): 2 g via INTRAVENOUS
  Filled 2022-07-06 (×2): qty 100

## 2022-07-06 MED ORDER — PHENYLEPHRINE HCL-NACL 20-0.9 MG/250ML-% IV SOLN
INTRAVENOUS | Status: DC | PRN
  Administered 2022-07-06: 40 ug/min via INTRAVENOUS

## 2022-07-06 MED ORDER — ONDANSETRON HCL 4 MG/2ML IJ SOLN
INTRAMUSCULAR | Status: AC
  Filled 2022-07-06: qty 2

## 2022-07-06 MED ORDER — FENTANYL CITRATE (PF) 100 MCG/2ML IJ SOLN
INTRAMUSCULAR | Status: DC | PRN
  Administered 2022-07-06 (×2): 50 ug via INTRAVENOUS

## 2022-07-06 MED ORDER — SODIUM CHLORIDE (PF) 0.9 % IJ SOLN
INTRAMUSCULAR | Status: DC | PRN
  Administered 2022-07-06: 120 mL

## 2022-07-06 MED ORDER — IRBESARTAN 150 MG PO TABS
150.0000 mg | ORAL_TABLET | Freq: Every day | ORAL | Status: DC
  Administered 2022-07-07: 150 mg via ORAL
  Filled 2022-07-06: qty 1

## 2022-07-06 MED ORDER — ASPIRIN 81 MG PO CHEW
81.0000 mg | CHEWABLE_TABLET | Freq: Two times a day (BID) | ORAL | Status: DC
  Administered 2022-07-06 – 2022-07-07 (×2): 81 mg via ORAL
  Filled 2022-07-06 (×2): qty 1

## 2022-07-06 MED ORDER — CHLORHEXIDINE GLUCONATE 0.12 % MT SOLN
OROMUCOSAL | Status: AC
  Filled 2022-07-06: qty 15

## 2022-07-06 MED ORDER — ONDANSETRON HCL 4 MG/2ML IJ SOLN
INTRAMUSCULAR | Status: DC | PRN
  Administered 2022-07-06: 4 mg via INTRAVENOUS

## 2022-07-06 MED ORDER — GABAPENTIN 300 MG PO CAPS
ORAL_CAPSULE | ORAL | Status: AC
  Filled 2022-07-06: qty 1

## 2022-07-06 MED ORDER — ALUM & MAG HYDROXIDE-SIMETH 200-200-20 MG/5ML PO SUSP: 30.0000 mL | ORAL | Status: DC | PRN

## 2022-07-06 MED ORDER — PANTOPRAZOLE SODIUM 40 MG PO TBEC
40.0000 mg | DELAYED_RELEASE_TABLET | Freq: Two times a day (BID) | ORAL | Status: DC
  Administered 2022-07-06 – 2022-07-07 (×2): 40 mg via ORAL
  Filled 2022-07-06 (×2): qty 1

## 2022-07-06 MED ORDER — CLOBETASOL PROPIONATE 0.05 % EX CREA
1.0000 | TOPICAL_CREAM | Freq: Two times a day (BID) | CUTANEOUS | Status: DC
  Filled 2022-07-06 (×2): qty 15

## 2022-07-06 MED ORDER — OXYCODONE HCL 5 MG PO TABS: 10.0000 mg | ORAL_TABLET | ORAL | Status: DC | PRN

## 2022-07-06 MED ORDER — LORATADINE 10 MG PO TABS
10.0000 mg | ORAL_TABLET | Freq: Every day | ORAL | Status: DC
  Administered 2022-07-07: 10 mg via ORAL
  Filled 2022-07-06 (×2): qty 1

## 2022-07-06 MED ORDER — HYDROMORPHONE HCL 1 MG/ML IJ SOLN: 0.5000 mg | INTRAMUSCULAR | Status: DC | PRN

## 2022-07-06 MED ORDER — FLEET ENEMA 7-19 GM/118ML RE ENEM: 1.0000 | ENEMA | Freq: Once | RECTAL | Status: DC | PRN

## 2022-07-06 MED ORDER — ACETAMINOPHEN 10 MG/ML IV SOLN
INTRAVENOUS | Status: AC
  Filled 2022-07-06: qty 100

## 2022-07-06 MED ORDER — ONDANSETRON HCL 4 MG PO TABS: 4.0000 mg | ORAL_TABLET | Freq: Four times a day (QID) | ORAL | Status: DC | PRN

## 2022-07-06 MED ORDER — VASOPRESSIN 20 UNIT/ML IV SOLN
INTRAVENOUS | Status: DC | PRN
  Administered 2022-07-06 (×2): 1 [IU] via INTRAVENOUS

## 2022-07-06 MED ORDER — FERROUS SULFATE 325 (65 FE) MG PO TABS
325.0000 mg | ORAL_TABLET | Freq: Two times a day (BID) | ORAL | Status: DC
  Administered 2022-07-07: 325 mg via ORAL
  Filled 2022-07-06: qty 1

## 2022-07-06 MED ORDER — DIPHENHYDRAMINE HCL 12.5 MG/5ML PO ELIX: 12.5000 mg | ORAL_SOLUTION | ORAL | Status: DC | PRN

## 2022-07-06 MED ORDER — SENNOSIDES-DOCUSATE SODIUM 8.6-50 MG PO TABS
1.0000 | ORAL_TABLET | Freq: Two times a day (BID) | ORAL | Status: DC
  Filled 2022-07-06 (×2): qty 1

## 2022-07-06 MED ORDER — ACETAMINOPHEN 10 MG/ML IV SOLN
1000.0000 mg | Freq: Four times a day (QID) | INTRAVENOUS | Status: DC
  Administered 2022-07-06 – 2022-07-07 (×3): 1000 mg via INTRAVENOUS
  Filled 2022-07-06 (×3): qty 100

## 2022-07-06 MED ORDER — METOCLOPRAMIDE HCL 5 MG PO TABS
10.0000 mg | ORAL_TABLET | Freq: Three times a day (TID) | ORAL | Status: DC
  Administered 2022-07-06 – 2022-07-07 (×2): 10 mg via ORAL
  Filled 2022-07-06 (×2): qty 2

## 2022-07-06 MED ORDER — OXYCODONE HCL 5 MG PO TABS
5.0000 mg | ORAL_TABLET | ORAL | Status: DC | PRN
  Administered 2022-07-07: 5 mg via ORAL
  Filled 2022-07-06 (×2): qty 1

## 2022-07-06 MED ORDER — GLYCOPYRROLATE 0.2 MG/ML IJ SOLN
INTRAMUSCULAR | Status: AC
  Filled 2022-07-06: qty 1

## 2022-07-06 MED ORDER — CEFAZOLIN SODIUM-DEXTROSE 2-4 GM/100ML-% IV SOLN
INTRAVENOUS | Status: AC
  Filled 2022-07-06: qty 100

## 2022-07-06 SURGICAL SUPPLY — 77 items
ATTUNE MED DOME PAT 38 KNEE (Knees) IMPLANT
ATTUNE PSFEM LTSZ6 NARCEM KNEE (Femur) IMPLANT
ATTUNE PSRP INSR SZ6 6 KNEE (Insert) IMPLANT
BASE TIBIAL ROT PLAT SZ 5 KNEE (Knees) IMPLANT
BATTERY INSTRU NAVIGATION (MISCELLANEOUS) ×4 IMPLANT
BLADE SAW 70X12.5 (BLADE) ×1 IMPLANT
BLADE SAW 90X13X1.19 OSCILLAT (BLADE) ×1 IMPLANT
BLADE SAW 90X25X1.19 OSCILLAT (BLADE) ×1 IMPLANT
BONE CEMENT GENTAMICIN (Cement) ×2 IMPLANT
BRUSH SCRUB EZ PLAIN DRY (MISCELLANEOUS) ×1 IMPLANT
BSPLAT TIB 5 CMNT ROT PLAT STR (Knees) ×1 IMPLANT
BTRY SRG DRVR LF (MISCELLANEOUS) ×4
CEMENT BONE GENTAMICIN 40 (Cement) IMPLANT
COOLER POLAR GLACIER W/PUMP (MISCELLANEOUS) ×1 IMPLANT
CUFF TOURN SGL QUICK 24 (TOURNIQUET CUFF)
CUFF TOURN SGL QUICK 34 (TOURNIQUET CUFF)
CUFF TRNQT CYL 24X4X16.5-23 (TOURNIQUET CUFF) IMPLANT
CUFF TRNQT CYL 34X4.125X (TOURNIQUET CUFF) IMPLANT
DRAPE 3/4 80X56 (DRAPES) ×1 IMPLANT
DRAPE INCISE IOBAN 66X45 STRL (DRAPES) IMPLANT
DRSG AQUACEL AG ADV 3.5X14 (GAUZE/BANDAGES/DRESSINGS) ×1 IMPLANT
DRSG DERMACEA NONADH 3X8 (GAUZE/BANDAGES/DRESSINGS) ×1 IMPLANT
DRSG MEPILEX SACRM 8.7X9.8 (GAUZE/BANDAGES/DRESSINGS) ×1 IMPLANT
DRSG TEGADERM 4X4.75 (GAUZE/BANDAGES/DRESSINGS) ×1 IMPLANT
DURAPREP 26ML APPLICATOR (WOUND CARE) ×2 IMPLANT
ELECT CAUTERY BLADE 6.4 (BLADE) ×1 IMPLANT
ELECT REM PT RETURN 9FT ADLT (ELECTROSURGICAL) ×1
ELECTRODE REM PT RTRN 9FT ADLT (ELECTROSURGICAL) ×1 IMPLANT
EX-PIN ORTHOLOCK NAV 4X150 (PIN) ×2 IMPLANT
GLOVE BIOGEL M STRL SZ7.5 (GLOVE) ×4 IMPLANT
GLOVE SRG 8 PF TXTR STRL LF DI (GLOVE) ×2 IMPLANT
GLOVE SURG UNDER POLY LF SZ8 (GLOVE) ×2
GOWN STRL REUS W/ TWL LRG LVL3 (GOWN DISPOSABLE) ×1 IMPLANT
GOWN STRL REUS W/ TWL XL LVL3 (GOWN DISPOSABLE) ×1 IMPLANT
GOWN STRL REUS W/TWL LRG LVL3 (GOWN DISPOSABLE) ×1
GOWN STRL REUS W/TWL XL LVL3 (GOWN DISPOSABLE) ×1
GOWN TOGA ZIPPER T7+ PEEL AWAY (MISCELLANEOUS) ×1 IMPLANT
HANDLE YANKAUER SUCT OPEN TIP (MISCELLANEOUS) ×1 IMPLANT
HEMOVAC 400CC 10FR (MISCELLANEOUS) ×1 IMPLANT
HOLDER FOLEY CATH W/STRAP (MISCELLANEOUS) ×1 IMPLANT
HOOD PEEL AWAY T7 (MISCELLANEOUS) ×1 IMPLANT
IV NS IRRIG 3000ML ARTHROMATIC (IV SOLUTION) ×1 IMPLANT
KIT TURNOVER KIT A (KITS) ×1 IMPLANT
KNIFE SCULPS 14X20 (INSTRUMENTS) ×1 IMPLANT
MANIFOLD NEPTUNE II (INSTRUMENTS) ×2 IMPLANT
NDL SPNL 20GX3.5 QUINCKE YW (NEEDLE) ×2 IMPLANT
NEEDLE SPNL 20GX3.5 QUINCKE YW (NEEDLE) ×2 IMPLANT
PACK TOTAL KNEE (MISCELLANEOUS) ×1 IMPLANT
PAD ABD DERMACEA PRESS 5X9 (GAUZE/BANDAGES/DRESSINGS) ×2 IMPLANT
PAD ARMBOARD 7.5X6 YLW CONV (MISCELLANEOUS) ×3 IMPLANT
PAD WRAPON POLAR KNEE (MISCELLANEOUS) ×1 IMPLANT
PENCIL SMOKE EVACUATOR COATED (MISCELLANEOUS) IMPLANT
PIN DRILL FIX HALF THREAD (BIT) ×2 IMPLANT
PIN DRILL QUICK PACK (PIN) ×2 IMPLANT
PIN FIXATION 1/8DIA X 3INL (PIN) ×1 IMPLANT
PULSAVAC PLUS IRRIG FAN TIP (DISPOSABLE) ×1
SOL PREP PVP 2OZ (MISCELLANEOUS) ×1
SOLUTION IRRIG SURGIPHOR (IV SOLUTION) ×1 IMPLANT
SOLUTION PREP PVP 2OZ (MISCELLANEOUS) ×1 IMPLANT
SPONGE DRAIN TRACH 4X4 STRL 2S (GAUZE/BANDAGES/DRESSINGS) ×1 IMPLANT
STAPLER SKIN PROX 35W (STAPLE) ×1 IMPLANT
STOCKINETTE IMPERV 14X48 (MISCELLANEOUS) ×1 IMPLANT
STRAP TIBIA SHORT (MISCELLANEOUS) ×1 IMPLANT
SUCTION TUBE FRAZIER 10FR DISP (SUCTIONS) ×1 IMPLANT
SUT VIC AB 0 CT1 36 (SUTURE) ×1 IMPLANT
SUT VIC AB 1 CT1 36 (SUTURE) ×2 IMPLANT
SUT VIC AB 2-0 CT2 27 (SUTURE) ×1 IMPLANT
SYR 30ML LL (SYRINGE) ×2 IMPLANT
TIBIAL BASE ROT PLAT SZ 5 KNEE (Knees) ×1 IMPLANT
TIP FAN IRRIG PULSAVAC PLUS (DISPOSABLE) ×1 IMPLANT
TOWEL OR 17X26 4PK STRL BLUE (TOWEL DISPOSABLE) IMPLANT
TOWER CARTRIDGE SMART MIX (DISPOSABLE) ×1 IMPLANT
TRAP FLUID SMOKE EVACUATOR (MISCELLANEOUS) ×1 IMPLANT
TRAY FOLEY MTR SLVR 16FR STAT (SET/KITS/TRAYS/PACK) ×1 IMPLANT
TUBING CONNECTING 10 (TUBING) ×2 IMPLANT
WATER STERILE IRR 1000ML POUR (IV SOLUTION) ×1 IMPLANT
WRAPON POLAR PAD KNEE (MISCELLANEOUS) ×1

## 2022-07-06 NOTE — Op Note (Signed)
OPERATIVE NOTE  DATE OF SURGERY:  07/06/2022  PATIENT NAME:  Marissa Huff   DOB: May 16, 1931  MRN: [id number]  PRE-OPERATIVE DIAGNOSIS: Degenerative arthrosis of the left knee, primary  POST-OPERATIVE DIAGNOSIS:  Same  PROCEDURE:  Left total knee arthroplasty using computer-assisted navigation  SURGEON:  Jena Gauss. M.D.  ASSISTANT:  Gean Birchwood, PA-C (present and scrubbed throughout the case, critical for assistance with exposure, retraction, instrumentation, and closure)  ANESTHESIA: spinal  ESTIMATED BLOOD LOSS: 50 mL  FLUIDS REPLACED: 1800 mL of crystalloid  TOURNIQUET TIME: 85 minutes  DRAINS: 2 medium Hemovac drains  SOFT TISSUE RELEASES: Anterior cruciate ligament, posterior cruciate ligament, deep medial collateral ligament, patellofemoral ligament, and posterolateral corner  IMPLANTS UTILIZED: DePuy Attune size 6N posterior stabilized femoral component (cemented), size 5 rotating platform tibial component (cemented), 38 mm medialized dome patella (cemented), and a 6 mm stabilized rotating platform polyethylene insert.  INDICATIONS FOR SURGERY: Marissa Huff is a 89 y.o. year old female with a long history of progressive knee pain. X-rays demonstrated severe degenerative changes in tricompartmental fashion. The patient had not seen any significant improvement despite conservative nonsurgical intervention. After discussion of the risks and benefits of surgical intervention, the patient expressed understanding of the risks benefits and agree with plans for total knee arthroplasty.   The risks, benefits, and alternatives were discussed at length including but not limited to the risks of infection, bleeding, nerve injury, stiffness, blood clots, the need for revision surgery, cardiopulmonary complications, among others, and they were willing to proceed.  PROCEDURE IN DETAIL: The patient was brought into the operating room and, after adequate spinal anesthesia was  achieved, a tourniquet was placed on the patient's upper thigh. The patient's knee and leg were cleaned and prepped with alcohol and DuraPrep and draped in the usual sterile fashion. A "timeout" was performed as per usual protocol. The lower extremity was exsanguinated using an Esmarch, and the tourniquet was inflated to 300 mmHg. An anterior longitudinal incision was made followed by a standard mid vastus approach. The deep fibers of the medial collateral ligament were elevated in a subperiosteal fashion off of the medial flare of the tibia so as to maintain a continuous soft tissue sleeve. The patella was subluxed laterally and the patellofemoral ligament was incised. Inspection of the knee demonstrated severe degenerative changes with full-thickness loss of articular cartilage. Osteophytes were debrided using a rongeur. Anterior and posterior cruciate ligaments were excised. Two 4.0 mm Schanz pins were inserted in the femur and into the tibia for attachment of the array of trackers used for computer-assisted navigation. Hip center was identified using a circumduction technique. Distal landmarks were mapped using the computer. The distal femur and proximal tibia were mapped using the computer. The distal femoral cutting guide was positioned using computer-assisted navigation so as to achieve a 5 distal valgus cut. The femur was sized and it was felt that a size 6N femoral component was appropriate. A size 6 femoral cutting guide was positioned and the anterior cut was performed and verified using the computer. This was followed by completion of the posterior and chamfer cuts. Femoral cutting guide for the central box was then positioned in the center box cut was performed.  Attention was then directed to the proximal tibia. Medial and lateral menisci were excised. The extramedullary tibial cutting guide was positioned using computer-assisted navigation so as to achieve a 0 varus-valgus alignment and 3  posterior slope. The cut was performed and verified using the computer.  The proximal tibia was sized and it was felt that a size 5 tibial tray was appropriate. Tibial and femoral trials were inserted followed by insertion of a 5 mm polyethylene insert. The knee was felt to be tight laterally.  The trial components were removed and the knee was brought into full extension and distracted using the Moreland retractors.  The posterolateral corner was carefully released using a combination of electrocautery and Metzenbaum scissors.  Trial components were reinserted followed by placement of a 6 mm polyethylene trial.  This allowed for excellent mediolateral soft tissue balancing both in flexion and in full extension. Finally, the patella was cut and prepared so as to accommodate a 38 mm medialized dome patella. A patella trial was placed and the knee was placed through a range of motion with excellent patellar tracking appreciated. The femoral trial was removed after debridement of posterior osteophytes. The central post-hole for the tibial component was reamed followed by insertion of a keel punch. Tibial trials were then removed. Cut surfaces of bone were irrigated with copious amounts of normal saline using pulsatile lavage and then suctioned dry. Polymethylmethacrylate cement with gentamicin was prepared in the usual fashion using a vacuum mixer. Cement was applied to the cut surface of the proximal tibia as well as along the undersurface of a size 5 rotating platform tibial component. Tibial component was positioned and impacted into place. Excess cement was removed using Personal assistant. Cement was then applied to the cut surfaces of the femur as well as along the posterior flanges of the size 6N femoral component. The femoral component was positioned and impacted into place. Excess cement was removed using Personal assistant. A 6 mm polyethylene trial was inserted and the knee was brought into full extension with  steady axial compression applied. Finally, cement was applied to the backside of a 38 mm medialized dome patella and the patellar component was positioned and patellar clamp applied. Excess cement was removed using Personal assistant. After adequate curing of the cement, the tourniquet was deflated after a total tourniquet time of 85 minutes. Hemostasis was achieved using electrocautery. The knee was irrigated with copious amounts of normal saline using pulsatile lavage followed by 450 ml of Surgiphor and then suctioned dry. 20 mL of 1.3% Exparel and 60 mL of 0.25% Marcaine in 40 mL of normal saline was injected along the posterior capsule, medial and lateral gutters, and along the arthrotomy site. A 6 mm stabilized rotating platform polyethylene insert was inserted and the knee was placed through a range of motion with excellent mediolateral soft tissue balancing appreciated and excellent patellar tracking noted. 2 medium drains were placed in the wound bed and brought out through separate stab incisions. The medial parapatellar portion of the incision was reapproximated using interrupted sutures of #1 Vicryl. Subcutaneous tissue was approximated in layers using first #0 Vicryl followed #2-0 Vicryl. The skin was approximated with skin staples. A sterile dressing was applied.  The patient tolerated the procedure well and was transported to the recovery room in stable condition.    Arkin Imran P. Angie Fava., M.D.

## 2022-07-06 NOTE — Interval H&P Note (Signed)
History and Physical Interval Note:  07/06/2022 1:22 PM  Marissa Huff  has presented today for surgery, with the diagnosis of PRIMARY OSTEOARTHRITIS OF LEFT KNEE..  The various methods of treatment have been discussed with the patient and family. After consideration of risks, benefits and other options for treatment, the patient has consented to  Procedure(s): COMPUTER ASSISTED TOTAL KNEE ARTHROPLASTY (Left) as a surgical intervention.  The patient's history has been reviewed, patient examined, no change in status, stable for surgery.  I have reviewed the patient's chart and labs.  Questions were answered to the patient's satisfaction.     Lavina Resor P Derwin Reddy

## 2022-07-06 NOTE — Anesthesia Preprocedure Evaluation (Signed)
Anesthesia Evaluation  Patient identified by MRN, date of birth, ID band Patient awake    Reviewed: Allergy & Precautions, NPO status , Patient's Chart, lab work & pertinent test results  History of Anesthesia Complications Negative for: history of anesthetic complications  Airway Mallampati: III  TM Distance: >3 FB Neck ROM: full    Dental no notable dental hx.    Pulmonary neg pulmonary ROS   Pulmonary exam normal        Cardiovascular hypertension, On Medications + Peripheral Vascular Disease  Normal cardiovascular exam  Echo 11/2021 IMPRESSIONS     1. Left ventricular ejection fraction, by estimation, is 55%   2. The left ventricle has normal function. The left ventricle has no  regional wall motion abnormalities. Left ventricular diastolic parameters  are consistent with Grade I diastolic dysfunction (impaired relaxation).   3. Right ventricular systolic function is normal. The right ventricular  size is normal.   4. The mitral valve is normal in structure. Mild mitral valve  regurgitation. No evidence of mitral stenosis.   5. The aortic valve is tricuspid. Aortic valve regurgitation is not  visualized. No aortic stenosis is present.   6. The inferior vena cava is normal in size with greater than 50%  respiratory variability, suggesting right atrial pressure of 3 mmHg.     Neuro/Psych  PSYCHIATRIC DISORDERS Anxiety      Neuromuscular disease    GI/Hepatic Neg liver ROS, PUD,GERD  ,,  Endo/Other  diabetes, Type 1, Insulin DependentHypothyroidism    Renal/GU Renal disease (CKD)     Musculoskeletal   Abdominal   Peds  Hematology  (+) Blood dyscrasia, anemia   Anesthesia Other Findings Past Medical History: No date: Allergy No date: Arthritis No date: Carotid artery disease (HCC) No date: Carpal tunnel syndrome, bilateral No date: Chronic kidney disease, stage 2, mildly decreased GFR No date: Diabetes  mellitus without complication (HCC) No date: Diverticulosis 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) No date: Gastroparesis No date: GERD (gastroesophageal reflux disease) No date: History of kidney stones No date: Hyperlipidemia No date: Hypertension associated with diabetes (HCC) No date: Hypothyroidism No date: Malignant lymphoma (HCC)     Comment:  gastric No date: PUD (peptic ulcer disease) No date: Skin cancer No date: Spinal stenosis     Comment:  lumbar laminectomy No date: Type 1 diabetes mellitus (HCC) No date: Urinary incontinence  Past Surgical History: 1994: antrectomy and vagotomy 10/24/2008: CARPAL TUNNEL RELEASE; Left 06/19/2009: CARPAL TUNNEL RELEASE; Right 2014: CATARACT EXTRACTION, BILATERAL; Bilateral 05/24/2007: COLONOSCOPY No date: EYE SURGERY     Comment:  bilateral cataracts 1996: gastric lymphoma     Comment:  surgery 12/05/2008: KNEE ARTHROSCOPY; Left 06/16/2010: KNEE ARTHROSCOPY; Right 06/1999: LAPAROSCOPIC CHOLECYSTECTOMY 1999: LUMBAR LAMINECTOMY 09/15/2015: LUMBAR LAMINECTOMY WITH COFLEX 1 LEVEL; N/A     Comment:  Procedure: Lumbar one-two Laminectomy with placement of               coflex;  Surgeon: Barnett Abu, MD;  Location: [facility]               [facility];  Service: Neurosurgery;  Laterality: N/A; 02/1978: PARTIAL HYSTERECTOMY     Comment:  fibroid tumors, ovaries not removed 1982: TONSILLECTOMY AND ADENOIDECTOMY 06/19/2009: TRIGGER FINGER RELEASE; Right     Reproductive/Obstetrics negative OB ROS                             Anesthesia Physical Anesthesia Plan  ASA: 2  Anesthesia Plan: Spinal   Post-op Pain Management: Ofirmev IV (intra-op)* and Toradol IV (intra-op)*   Induction: Intravenous  PONV Risk Score and Plan: 2 and Propofol infusion and TIVA  Airway Management Planned: Natural Airway and Nasal Cannula  Additional Equipment:   Intra-op Plan:   Post-operative Plan:   Informed Consent: I have  reviewed the patients History and Physical, chart, labs and discussed the procedure including the risks, benefits and alternatives for the proposed anesthesia with the patient or authorized representative who has indicated his/her understanding and acceptance.     Dental Advisory Given  Plan Discussed with: Anesthesiologist, CRNA and Surgeon  Anesthesia Plan Comments: (Patient reports no bleeding problems and no anticoagulant use.  Plan for spinal with backup GA  Patient consented for risks of anesthesia including but not limited to:  - adverse reactions to medications - damage to eyes, teeth, lips or other oral mucosa - nerve damage due to positioning  - risk of bleeding, infection and or nerve damage from spinal that could lead to paralysis - risk of headache or failed spinal - damage to teeth, lips or other oral mucosa - sore throat or hoarseness - damage to heart, brain, nerves, lungs, other parts of body or loss of life  Patient voiced understanding.)        Anesthesia Quick Evaluation

## 2022-07-06 NOTE — Transfer of Care (Signed)
Immediate Anesthesia Transfer of Care Note  Patient: Marissa Huff  Procedure(s) Performed: COMPUTER ASSISTED TOTAL KNEE ARTHROPLASTY (Left: Knee)  Patient Location: PACU  Anesthesia Type:Spinal  Level of Consciousness: awake, alert , and oriented  Airway & Oxygen Therapy: Patient Spontanous Breathing and Patient connected to face mask oxygen  Post-op Assessment: Report given to RN and Post -op Vital signs reviewed and stable  Post vital signs: Reviewed and stable  Last Vitals:  Vitals Value Taken Time  BP 128/62   Temp    Pulse 83   Resp 12   SpO2 100     Last Pain:  Vitals:   07/06/22 1148  PainSc: 4          Complications: No notable events documented.

## 2022-07-06 NOTE — Anesthesia Procedure Notes (Signed)
Spinal  Patient location during procedure: OR Start time: 07/06/2022 1:56 PM End time: 07/06/2022 2:04 PM Reason for block: surgical anesthesia Staffing Performed: other anesthesia staff and resident/CRNA  Other anesthesia staff: Cozart, Lauren, RN Performed by: Maryla Morrow., CRNA Authorized by: Louie Boston, MD   Preanesthetic Checklist Completed: patient identified, IV checked, site marked, risks and benefits discussed, surgical consent, monitors and equipment checked, pre-op evaluation and timeout performed Spinal Block Patient position: sitting Prep: DuraPrep Patient monitoring: continuous pulse ox and blood pressure Approach: midline Location: L3-4 Injection technique: single-shot Needle Needle type: Pencan  Needle gauge: 24 G Needle length: 10 cm Assessment Sensory level: T4 Events: CSF return Additional Notes Patient denies complaints of paresthesia, numbness. CSF return noted. Lauren Cozart, SRNA placed spinal under direct supervision.

## 2022-07-07 DIAGNOSIS — M1712 Unilateral primary osteoarthritis, left knee: Secondary | ICD-10-CM | POA: Diagnosis not present

## 2022-07-07 LAB — GLUCOSE, CAPILLARY
Glucose-Capillary: 305 mg/dL — ABNORMAL HIGH (ref 70–99)
Glucose-Capillary: 299 mg/dL — ABNORMAL HIGH (ref 70–99)

## 2022-07-07 MED ORDER — OXYCODONE HCL 5 MG PO TABS: 5.0000 mg | ORAL_TABLET | ORAL | 0 refills | Status: DC | PRN

## 2022-07-07 MED ORDER — ASPIRIN 81 MG PO CHEW: 81.0000 mg | CHEWABLE_TABLET | Freq: Two times a day (BID) | ORAL | 0 refills | Status: AC

## 2022-07-07 MED ORDER — ACETAMINOPHEN 325 MG PO TABS: 325.0000 mg | ORAL_TABLET | Freq: Four times a day (QID) | ORAL | Status: DC | PRN

## 2022-07-07 MED ORDER — TRAMADOL HCL 50 MG PO TABS: 50.0000 mg | ORAL_TABLET | ORAL | 0 refills | Status: DC | PRN

## 2022-07-07 MED ORDER — CELECOXIB 200 MG PO CAPS: 200.0000 mg | ORAL_CAPSULE | Freq: Two times a day (BID) | ORAL | 0 refills | Status: AC

## 2022-07-07 MED ORDER — ONDANSETRON HCL 4 MG PO TABS: 4.0000 mg | ORAL_TABLET | Freq: Four times a day (QID) | ORAL | 0 refills | Status: DC | PRN

## 2022-07-07 MED ORDER — SENNOSIDES-DOCUSATE SODIUM 8.6-50 MG PO TABS: 1.0000 | ORAL_TABLET | Freq: Two times a day (BID) | ORAL | 0 refills | Status: DC

## 2022-07-07 NOTE — Discharge Summary (Signed)
Physician Discharge Summary  Patient ID: Marissa Huff MRN: [id number] DOB/AGE: 1931/02/14 89 y.o.  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   Sinusitis 12/30/2019   Macular degeneration 10/01/2018   Carotid artery disease, unspecified laterality (HCC) 02/23/2018   Right carotid bruit 02/04/2018   UPJ obstruction, congenital 07/26/2016   Lumbar stenosis with neurogenic claudication 09/15/2015   Nephrolithiasis 06/28/2015   Persistent cough 12/02/2014   Health care maintenance 05/11/2014   Obesity (BMI 30-39.9) 01/11/2014   GERD (gastroesophageal reflux disease) 01/11/2014   Environmental allergies 01/11/2014   CKD (chronic kidney disease) stage 2, GFR 60-89 ml/min 10/23/2013   Hydronephrosis 08/26/2013   Constipation 08/05/2013   DDD (degenerative disc disease), lumbar 07/15/2013   Lumbar radiculitis 05/10/2013   Right shoulder pain 05/10/2013   Anemia 04/15/2013   Carpal tunnel syndrome 04/15/2013   Trigger finger 04/15/2013   Hyperlipidemia due to type 1 diabetes mellitus (HCC) 04/15/2013   Microscopic hematuria 09/20/2012   Incomplete emptying of bladder 02/27/2012   Mixed urge and stress incontinence 02/27/2012   Symptoms involving urinary system 02/27/2012   Chronic cystitis 02/27/2012   Recurrent UTI 01/27/2012   History of lymphoma 12/12/2011   Spinal stenosis 12/12/2011   Diverticulosis 12/12/2011   Gastroparesis 12/12/2011   Peptic ulcer disease 12/12/2011   Hypothyroidism 12/12/2011   Hypercholesterolemia 12/12/2011   Hypertension 12/12/2011   Type 1 diabetes mellitus with hyperglycemia  (HCC) 12/12/2011   Diverticulosis of colon 12/12/2011   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) 12/12/2011    Past Medical History:  Diagnosis Date   Allergy    Arthritis    Carotid artery disease (HCC)    Carpal tunnel syndrome, bilateral    Chronic kidney disease, stage 2, mildly decreased GFR    Diabetes mellitus without complication (HCC)    Diverticulosis    Gastric lymphoma (HCC)    Gastroparesis    GERD (gastroesophageal reflux disease)    History of kidney stones    Hyperlipidemia    Hypertension associated with diabetes (HCC)    Hypothyroidism    Malignant lymphoma (HCC)    gastric   PUD (peptic ulcer disease)    Skin cancer    Spinal stenosis    lumbar laminectomy   Type 1 diabetes mellitus (HCC)    Urinary incontinence      Transfusion: None   Consultants (if any):   Discharged Condition: Improved  Hospital Course: Marissa Huff is an 89 y.o. female who was admitted 07/06/2022 with a diagnosis of Total knee replacement status and went to the operating room on 07/06/2022 and underwent the above named procedures.    Surgeries: Procedure(s): COMPUTER ASSISTED TOTAL KNEE ARTHROPLASTY on 07/06/2022 Patient tolerated the surgery well. Taken to PACU where she was stabilized and then transferred to the orthopedic floor.  Started on aspirin 81 mg twice daily, TEDs and SCDs applied bilaterally. Heels elevated on bed. No evidence of DVT. Negative Homan. Physical therapy started on day #1 for gait training and transfer. OT started day #1 for ADL and assisted devices.  Patient's IV and hemovac was d/c on day #1. Patient was able to safely and  independently complete all PT goals. PT recommending discharge to home.    On post op day #1 patient was stable and ready for discharge to home with home health PT.  Implants:  DePuy Attune size 6N posterior stabilized femoral component (cemented), size 5 rotating platform tibial component (cemented), 38 mm medialized  dome patella (cemented), and a 6 mm stabilized rotating platform polyethylene insert.    She was given perioperative antibiotics:  Anti-infectives (From admission, onward)    Start     Dose/Rate Route Frequency Ordered Stop   07/06/22 2000  ceFAZolin (ANCEF) IVPB 2g/100 mL premix        2 g 200 mL/hr over 30 Minutes Intravenous Every 6 hours 07/06/22 1847 07/07/22 0140   07/06/22 0600  ceFAZolin (ANCEF) IVPB 2g/100 mL premix        2 g 200 mL/hr over 30 Minutes Intravenous On call to O.R. 07/05/22 2151 07/06/22 1429     .  She was given sequential compression devices, early ambulation, and aspirin, teds for DVT prophylaxis.  She benefited maximally from the hospital stay and there were no complications.    Recent vital signs:  Vitals:   07/06/22 2339 07/07/22 0734  BP: (!) 119/54 128/88  Pulse: 67 66  Resp: 18 17  Temp: 97.8 F (36.6 C) 98.6 F (37 C)  SpO2: 97% 98%    Recent laboratory studies:  Lab Results  Component Value Date   HGB 11.6 (L) 06/28/2022   HGB 12.0 03/04/2021   HGB 11.3 (L) 11/10/2020   Lab Results  Component Value Date   WBC 5.8 06/28/2022   PLT 226 06/28/2022   No results found for: "INR" Lab Results  Component Value Date   NA 136 06/28/2022   K 3.5 06/28/2022   CL 103 06/28/2022   CO2 23 06/28/2022   BUN 21 06/28/2022   CREATININE 0.79 06/28/2022   GLUCOSE 189 (H) 06/28/2022    Discharge Medications:   Allergies as of 07/07/2022       Reactions   Penicillins Rash   IgE = 10 (WNL) on 06/28/2022        Medication List     STOP taking these medications    acetaminophen 650 MG CR tablet Commonly known as: TYLENOL Replaced by: acetaminophen 325 MG tablet   aspirin 81 MG tablet Replaced by: aspirin 81 MG chewable tablet       TAKE these medications    acetaminophen 325 MG tablet Commonly known as: TYLENOL Take 1-2 tablets (325-650 mg total) by mouth every 6 (six) hours as needed for mild pain (pain score 1-3 or temp >  100.5). Replaces: acetaminophen 650 MG CR tablet   albuterol 108 (90 Base) MCG/ACT inhaler Commonly known as: VENTOLIN HFA TAKE 2 PUFFS BY MOUTH EVERY 6 HOURS AS NEEDED FOR WHEEZE OR SHORTNESS OF BREATH   amLODipine 5 MG tablet Commonly known as: NORVASC TAKE 1 TABLET (5 MG TOTAL) BY MOUTH DAILY.   aspirin 81 MG chewable tablet Chew 1 tablet (81 mg total) by mouth 2 (two) times daily. Replaces: aspirin 81 MG tablet   Benefiber Powd Take 1 Dose by mouth daily as needed.   celecoxib 200 MG capsule Commonly known as: CELEBREX Take 1 capsule (200 mg total) by mouth 2 (two) times daily for 14 days.   clobetasol 0.05 % external solution Commonly known as: TEMOVATE Apply 1 Application topically 2 (two) times daily.   dextromethorphan-guaiFENesin 30-600 MG 12hr tablet Commonly known as: MUCINEX DM Take  1 tablet by mouth daily as needed for cough.   diphenhydrAMINE 25 MG tablet Commonly known as: BENADRYL Take 25 mg by mouth at bedtime as needed.   fexofenadine 180 MG tablet Commonly known as: ALLEGRA Take 180 mg by mouth daily.   fluticasone 50 MCG/ACT nasal spray Commonly known as: FLONASE SPRAY 2 SPRAYS INTO EACH NOSTRIL EVERY DAY What changed: See the new instructions.   FreeStyle Libre 14 Day Reader Hardie Pulley Use 1 each as needed .  Use to test blood sugar   FreeStyle Libre 14 Day Sensor Misc Use 1 each every 14 (fourteen) days   glucagon 1 MG injection Inject 1 mg into the muscle once as needed.   ketoconazole 2 % shampoo Commonly known as: NIZORAL Apply 1 Application topically 3 (three) times a week.   Lantus SoloStar 100 UNIT/ML Solostar Pen Generic drug: insulin glargine Inject 20-26 Units into the skin at bedtime. Sliding scale   levothyroxine 200 MCG tablet Commonly known as: SYNTHROID TAKE 1 TABLET (200 MCG TOTAL) BY MOUTH DAILY BEFORE BREAKFAST.   MIRALAX PO Take 1 Dose by mouth daily as needed.   MULTIPLE VITAMINS-MINERALS PO Take 1 tablet by mouth  daily.   NovoLOG FlexPen 100 UNIT/ML FlexPen Generic drug: insulin aspart Inject 4-13 Units into the skin 3 (three) times daily with meals. Sliding scale   ondansetron 4 MG tablet Commonly known as: ZOFRAN Take 1 tablet (4 mg total) by mouth every 6 (six) hours as needed for nausea.   OVER THE COUNTER MEDICATION Take 2 capsules by mouth daily. Cerebra Brain Health   OVER THE COUNTER MEDICATION Take 2 capsules by mouth daily as needed. Gluco Switch Blood sugar support   oxyCODONE 5 MG immediate release tablet Commonly known as: Oxy IR/ROXICODONE Take 1 tablet (5 mg total) by mouth every 4 (four) hours as needed for moderate pain (pain score 4-6).   pantoprazole 40 MG tablet Commonly known as: PROTONIX TAKE 1 TABLET BY MOUTH TWICE A DAY   Pataday 0.7 % Soln Generic drug: Olopatadine HCl Apply 1 drop to eye daily as needed.   rosuvastatin 5 MG tablet Commonly known as: CRESTOR TAKE 1 TABLET BY MOUTH THREE TIMES A WEEK. What changed:  how much to take how to take this when to take this additional instructions   senna-docusate 8.6-50 MG tablet Commonly known as: Senokot-S Take 1 tablet by mouth 2 (two) times daily.   SYSTANE OP Place 1 drop into both eyes at bedtime.   traMADol 50 MG tablet Commonly known as: ULTRAM Take 1 tablet (50 mg total) by mouth every 4 (four) hours as needed for moderate pain. What changed:  when to take this reasons to take this   valsartan-hydrochlorothiazide 160-12.5 MG tablet Commonly known as: DIOVAN-HCT TAKE 1 TABLET BY MOUTH EVERY DAY               Durable Medical Equipment  (From admission, onward)           Start     Ordered   07/06/22 1847  DME Walker rolling  Once       Question:  Patient needs a walker to treat with the following condition  Answer:  Total knee replacement status   07/06/22 1847   07/06/22 1847  DME Bedside commode  Once       Comments: Patient is not able to walk the distance required to go the  bathroom, or he/she is unable to safely negotiate stairs required to access the  bathroom.  A 3in1 BSC will alleviate this problem  Question:  Patient needs a bedside commode to treat with the following condition  Answer:  Total knee replacement status   07/06/22 1847            Diagnostic Studies: DG Knee Left Port  Result Date: 07/06/2022 CLINICAL DATA:  Sepsis left total knee replacement. EXAM: PORTABLE LEFT KNEE - 1-2 VIEW COMPARISON:  03/19/2020 FINDINGS: Status post tricompartmental left knee replacement. No evidence for immediate hardware complication. Surgical drains visualized in situ. Skin staples overlie the anterior knee. IMPRESSION: Status post tricompartmental left knee replacement without evidence for immediate hardware complication. Electronically Signed   By: Kennith Center M.D.   On: 07/06/2022 18:16    Disposition:      Follow-up Information     Dedra Skeens, PA-C Follow up on 07/20/2022.   Specialty: Orthopedic Surgery Why: at 10:15am Contact information: [address] [place] [address] [phone]         Donato Heinz, MD Follow up on 08/18/2022.   Specialty: Orthopedic Surgery Why: at 2:45pm Contact information: [address] [facility] [place] [address] [phone]                  Signed: Patience Musca 07/07/2022, 10:52 AM

## 2022-07-07 NOTE — Evaluation (Signed)
Occupational Therapy Evaluation Patient Details Name: Marissa Huff MRN: [id number] DOB: 28-May-1931 Today's Date: 07/07/2022   History of Present Illness 89 y/o female s/p L TKA 07/06/22.   Clinical Impression   Pt is a 89 yo female s/p TKA on 07/06/22.  She lives with her husband in a 2 story home but will stay on the main level while recovering from her surgery.  She was independent with self care and IADLs prior to admission.  She presents with muscle weakness, decreased balance and decreased ability to perform self care and IADL tasks. She would benefit from skilled OT services to maximize safety and independence in necessary daily tasks.  She has a walker, reacher, built in shower seat and BSC at home.       Recommendations for follow up therapy are one component of a multi-disciplinary discharge planning process, led by the attending physician.  Recommendations may be updated based on patient status, additional functional criteria and insurance authorization.   Assistance Recommended at Discharge Frequent or constant Supervision/Assistance  Patient can return home with the following A little help with walking and/or transfers;Assistance with cooking/housework;Assist for transportation;Direct supervision/assist for medications management;A little help with bathing/dressing/bathroom    Functional Status Assessment  Patient has had a recent decline in their functional status and demonstrates the ability to make significant improvements in function in a reasonable and predictable amount of time.  Equipment Recommendations       Recommendations for Other Services       Precautions / Restrictions Precautions Precautions: Fall Precaution Comments: paper HEP issued Restrictions Weight Bearing Restrictions: Yes LLE Weight Bearing: Weight bearing as tolerated      Mobility Bed Mobility               General bed mobility comments: Pt in chair on arrival    Transfers Overall  transfer level: Needs assistance Equipment used: Rolling walker (2 wheels) Transfers: Sit to/from Stand Sit to Stand: Min guard                  Balance Overall balance assessment: Mild deficits observed, not formally tested                                         ADL either performed or assessed with clinical judgement   ADL Overall ADL's : Needs assistance/impaired Eating/Feeding: Modified independent   Grooming: Standing;Min guard   Upper Body Bathing: Set up   Lower Body Bathing: Minimal assistance   Upper Body Dressing : Modified independent   Lower Body Dressing: Minimal assistance   Toilet Transfer: Min guard           Functional mobility during ADLs: Min guard General ADL Comments: Pt instructed on lower body dressing following knee surgery, able to demonstrate with min assist and also has a reacher at home to assist with task.  She also has assist from her husband and daughter.     Vision Baseline Vision/History: 1 Wears glasses       Perception     Praxis      Pertinent Vitals/Pain Pain Assessment Pain Score: 2      Hand Dominance Right   Extremity/Trunk Assessment Upper Extremity Assessment Upper Extremity Assessment: Generalized weakness   Lower Extremity Assessment Lower Extremity Assessment: Defer to PT evaluation       Communication Communication Communication: [facility]   Cognition Arousal/Alertness: Awake/alert  Behavior During Therapy: WFL for tasks assessed/performed Overall Cognitive Status: History of cognitive impairments - at baseline                                       General Comments  Mild balance deficit noted when performing clothing negotiation in standing to pull up pants.  Recommend she have someone next to her when performing clothing negotiation for balance.    Exercises     Shoulder Instructions      Home Living Family/patient expects to be discharged to:: Private  residence Living Arrangements: Spouse/significant other Available Help at Discharge: Family;Available 24 hours/day Type of Home: House Home Access: Stairs to enter [employer] of Steps: 3 Entrance Stairs-Rails: Right;Left Home Layout: Two level;Able to live on main level with bedroom/bathroom Alternate Level Stairs-Number of Steps: flight   Bathroom Shower/Tub: Tub/shower unit;Walk-in shower   Bathroom Toilet: Handicapped height Bathroom Accessibility: Yes   Home Equipment: Agricultural consultant (2 wheels);BSC/3in1   Additional Comments: Pt lives in a 2 story home but will be staying on the 1st level when returning home.  She has a walk in shower on the main level with a built in shower seat.  She has a cane and a walker, as well as a BSC.      Prior Functioning/Environment Prior Level of Function : Independent/Modified Independent             Mobility Comments: more difficulty recently 2/2 knee pain ADLs Comments: daughters (and husband) help with laundry, cooking, etc        OT Problem List: Decreased strength;Impaired balance (sitting and/or standing);Pain;Decreased range of motion;Decreased activity tolerance      OT Treatment/Interventions: Self-care/ADL training;DME and/or AE instruction;Therapeutic activities;Balance training;Therapeutic exercise;Patient/family education    OT Goals(Current goals can be found in the care plan section) Acute Rehab OT Goals Patient Stated Goal: to go home and do as much as I can for myself OT Goal Formulation: With patient Time For Goal Achievement: 07/16/22 Potential to Achieve Goals: Good ADL Goals Pt Will Perform Lower Body Dressing: with set-up Pt Will Transfer to Toilet: with modified independence  OT Frequency: Min 2X/week    Co-evaluation              AM-PAC OT "6 Clicks" Daily Activity     Outcome Measure Help from another person eating meals?: None Help from another person taking care of personal grooming?:  None Help from another person toileting, which includes using toliet, bedpan, or urinal?: A Little Help from another person bathing (including washing, rinsing, drying)?: A Little Help from another person to put on and taking off regular upper body clothing?: None Help from another person to put on and taking off regular lower body clothing?: A Little 6 Click Score: 21   End of Session Equipment Utilized During Treatment: Gait belt;Rolling walker (2 wheels)  Activity Tolerance: Patient tolerated treatment well Patient left: in chair;with call bell/phone within reach;with family/visitor present  OT Visit Diagnosis: Unsteadiness on feet (R26.81);Pain;Muscle weakness (generalized) (M62.81)                Time: 4696-2952 OT Time Calculation (min): 21 min Charges:  OT General Charges $OT Visit: 1 Visit OT Evaluation $OT Eval Low Complexity: 1 Low  Jossilyn Benda T Ray Gervasi, OTR/L, CLT Budd [place] 07/07/2022, 11:48 AM

## 2022-07-07 NOTE — Anesthesia Postprocedure Evaluation (Signed)
Anesthesia Post Note  Patient: Marissa Huff  Procedure(s) Performed: COMPUTER ASSISTED TOTAL KNEE ARTHROPLASTY (Left: Knee)  Patient location during evaluation: PACU Anesthesia Type: Spinal Level of consciousness: awake and alert Pain management: pain level controlled Vital Signs Assessment: post-procedure vital signs reviewed and stable Respiratory status: spontaneous breathing, nonlabored ventilation and respiratory function stable Cardiovascular status: blood pressure returned to baseline and stable Postop Assessment: no apparent nausea or vomiting Anesthetic complications: no   No notable events documented.   Last Vitals:  Vitals:   07/06/22 2339 07/07/22 0734  BP: (!) 119/54 128/88  Pulse: 67 66  Resp: 18 17  Temp: 36.6 C 37 C  SpO2: 97% 98%    Last Pain:  Vitals:   07/07/22 0843  TempSrc:   PainSc: 0-No pain                 Foye Deer

## 2022-07-07 NOTE — TOC Progression Note (Signed)
Transition of Care [facility]) - Progression Note    Patient Details  Name: Marissa Huff MRN: [id number] Date of Birth: [birth year]  [facility] CM/SW Contact  Marlowe Sax, RN Phone Number: 07/07/2022, 8:38 AM  Clinical Narrative:   Patient is set p with [facility] prior to surgery by surgeons office         Expected Discharge Plan and Services                                               Social Determinants of Health (SDOH) Interventions SDOH Screenings   Food Insecurity: No Food Insecurity (07/06/2022)  Housing: Low Risk  (07/06/2022)  Transportation Needs: No Transportation Needs (07/06/2022)  Utilities: Not At Risk (07/06/2022)  Depression (PHQ2-9): Low Risk  (06/30/2022)  Financial Resource Strain: Low Risk  (01/28/2021)  Physical Activity: Unknown (01/19/2018)  Social Connections: Socially Integrated (01/28/2021)  Stress: No Stress Concern Present (01/28/2021)  Tobacco Use: Low Risk  (07/06/2022)    Readmission Risk Interventions     No data to display

## 2022-07-07 NOTE — Evaluation (Signed)
Physical Therapy Evaluation Patient Details Name: Marissa Huff MRN: [id number] DOB: [birth year] Today's Date: 07/07/2022  History of Present Illness  89 y/o female s/p L TKA 07/06/22.  Clinical Impression  Pt very pleasant and eager to work with PT, ultimately did very well and exceeded expectations for POD1 PT exam.  She did very well with exercises and put forth good effort, introduced and performed HEP, ROM 0-71, able to do 10 SLRs, ambulated >200 ft with consistent and confident cadence and negotiated up/down steps with single rail w/o issue.  Pt will benefit from continued PT per TKA protocol.      Assistance Recommended at Discharge Intermittent Supervision/Assistance  If plan is discharge home, recommend the following:  Can travel by private vehicle  A little help with bathing/dressing/bathroom;Assistance with cooking/housework;Help with stairs or ramp for entrance        Equipment Recommendations Rolling walker (2 wheels)  Recommendations for Other Services       Functional Status Assessment Patient has had a recent decline in their functional status and demonstrates the ability to make significant improvements in function in a reasonable and predictable amount of time.     Precautions / Restrictions Precautions Precautions: Fall Precaution Comments: paper HEP issued Restrictions Weight Bearing Restrictions: Yes LLE Weight Bearing: Weight bearing as tolerated      Mobility  Bed Mobility Overal bed mobility: Modified Independent             General bed mobility comments: Pt did surprisingly well getting from supine to sit w/o assist or need for bed rails    Transfers Overall transfer level: Needs assistance Equipment used: Rolling walker (2 wheels) Transfers: Sit to/from Stand Sit to Stand: Min guard           General transfer comment: after cuing for set up (L&UEs) she was able to rise from standard height bed w/o phyiscal assist     Ambulation/Gait Ambulation/Gait assistance: Supervision Gait Distance (Feet): 225 Feet Assistive device: Rolling walker (2 wheels)         General Gait Details: Pt was able to assume consistent and reciprocal gait pattern quickly and w/o hesitation.  She displayed appropriate reliance on the walker and had no LOBs or unsteadiness.  Pt's O2 remained in the mid 90s t/o the effort, no c/o increased pain or excessive fatigue with first real bout of ambulation post op.  Stairs Stairs: Yes Stairs assistance: Min guard Stair Management: One rail Left, Sideways Number of Stairs: 4 General stair comments: Pt was able to negotiate up/down steps with relative ease after minimal cuing for strategy and sequencing.  Wheelchair Mobility     Tilt Bed    Modified Rankin (Stroke Patients Only)       Balance Overall balance assessment: Modified Independent                                           Pertinent Vitals/Pain Pain Assessment Pain Assessment: 0-10 Pain Score: 2  Pain Location: Pt reports only minimal pain t/o session, even during ROM tasks    Home Living Family/patient expects to be discharged to:: Private residence Living Arrangements: Spouse/significant other Available Help at Discharge: Family;Available 24 hours/day   Home Access: Stairs to enter Entrance Stairs-Rails: Right;Left (wide) Entrance Stairs-Number of Steps: 3 Alternate Level Stairs-Number of Steps: flight Home Layout: Two level;Able to live on main level with bedroom/bathroom (  main bed room is upstairs) Home Equipment: Agricultural consultant (2 wheels);BSC/3in1      Prior Function Prior Level of Function : Independent/Modified Independent             Mobility Comments: more difficulty recently 2/2 knee pain ADLs Comments: daughters (and husband) help with laundry, cooking, etc     Hand Dominance        Extremity/Trunk Assessment   Upper Extremity Assessment Upper Extremity  Assessment: Overall WFL for tasks assessed;Generalized weakness    Lower Extremity Assessment Lower Extremity Assessment: Overall WFL for tasks assessed;Generalized weakness (expected post-op weakness - able to SLR after warming up)       Communication   Communication: HOH  Cognition Arousal/Alertness: Awake/alert Behavior During Therapy: WFL for tasks assessed/performed Overall Cognitive Status: History of cognitive impairments - at baseline                                          General Comments General comments (skin integrity, edema, etc.): Pt showed great effort and exceeded expectations for POD1 PT exam.    Exercises Total Joint Exercises Ankle Circles/Pumps: AROM, 10 reps Quad Sets: Strengthening, 10 reps Short Arc Quad: AROM, Strengthening, 10 reps Heel Slides: AROM, 10 reps Hip ABduction/ADduction: AROM, Strengthening, 10 reps Straight Leg Raises: AROM, 10 reps Knee Flexion: PROM, 5 reps Goniometric ROM: 0-71   Assessment/Plan    PT Assessment Patient needs continued PT services  PT Problem List Decreased strength;Decreased range of motion;Decreased activity tolerance;Decreased knowledge of use of DME;Decreased safety awareness;Pain       PT Treatment Interventions DME instruction;Gait training;Stair training;Functional mobility training;Therapeutic activities;Therapeutic exercise;Balance training;Neuromuscular re-education;Patient/family education    PT Goals (Current goals can be found in the Care Plan section)  Acute Rehab PT Goals Patient Stated Goal: go home PT Goal Formulation: With patient Time For Goal Achievement: 07/20/22 Potential to Achieve Goals: Good    Frequency BID     Co-evaluation               AM-PAC PT "6 Clicks" Mobility  Outcome Measure Help needed turning from your back to your side while in a flat bed without using bedrails?: None Help needed moving from lying on your back to sitting on the side of a flat  bed without using bedrails?: None Help needed moving to and from a bed to a chair (including a wheelchair)?: A Little Help needed standing up from a chair using your arms (e.g., wheelchair or bedside chair)?: A Little Help needed to walk in hospital room?: A Little Help needed climbing 3-5 steps with a railing? : A Little 6 Click Score: 20    End of Session Equipment Utilized During Treatment: Gait belt Activity Tolerance: Patient tolerated treatment well Patient left: with chair alarm set;with call bell/phone within reach (PA in room to change dressing) Nurse Communication: Mobility status PT Visit Diagnosis: Muscle weakness (generalized) (M62.81);Difficulty in walking, not elsewhere classified (R26.2);Pain Pain - Right/Left: Left Pain - part of body: Knee    Time: 4098-1191 PT Time Calculation (min) (ACUTE ONLY): 55 min   Charges:   PT Evaluation $PT Eval Low Complexity: 1 Low PT Treatments $Gait Training: 8-22 mins $Therapeutic Exercise: 8-22 mins $Therapeutic Activity: 8-22 mins PT General Charges $$ ACUTE PT VISIT: 1 Visit         Malachi Pro, DPT 07/07/2022, 11:25 AM

## 2022-07-07 NOTE — Plan of Care (Signed)
  Problem: Skin Integrity: Goal: Risk for impaired skin integrity will decrease Outcome: Progressing   Problem: Activity: Goal: Ability to avoid complications of mobility impairment will improve Outcome: Progressing Goal: Range of joint motion will improve Outcome: Progressing   Problem: Pain Management: Goal: Pain level will decrease with appropriate interventions Outcome: Progressing   Problem: Skin Integrity: Goal: Will show signs of wound healing Outcome: Progressing   Problem: Safety: Goal: Ability to remain free from injury will improve Outcome: Progressing   Problem: Skin Integrity: Goal: Risk for impaired skin integrity will decrease Outcome: Progressing

## 2022-07-07 NOTE — Progress Notes (Signed)
   Subjective: 1 Day Post-Op Procedure(s) (LRB): COMPUTER ASSISTED TOTAL KNEE ARTHROPLASTY (Left) Patient reports pain as mild.   Patient is well, and has had no acute complaints or problems Denies any CP, SOB, ABD pain.  No coughing or fevers. Patient successfully completed physical therapy goals this morning Plan is to go Home after hospital stay.   Objective: Vital signs in last 24 hours: Temp:  [97.1 F (36.2 C)-98.6 F (37 C)] 98.6 F (37 C) (07/04 0734) Pulse Rate:  [61-86] 66 (07/04 0734) Resp:  [16-22] 17 (07/04 0734) BP: (94-145)/(52-110) 128/88 (07/04 0734) SpO2:  [93 %-100 %] 98 % (07/04 0734) Weight:  [81.9 kg] 81.9 kg (07/03 1148)  Intake/Output from previous day: 07/03 0701 - 07/04 0700 In: 2200 [P.O.:100; I.V.:1900; IV Piggyback:200] Out: 1270 [Urine:1000; Drains:220; Blood:50] Intake/Output this shift: No intake/output data recorded.  No results for input(s): "HGB" in the last 72 hours. No results for input(s): "WBC", "RBC", "HCT", "PLT" in the last 72 hours. No results for input(s): "NA", "K", "CL", "CO2", "BUN", "CREATININE", "GLUCOSE", "CALCIUM" in the last 72 hours. No results for input(s): "LABPT", "INR" in the last 72 hours.  EXAM General - Patient is Alert, Appropriate, and Oriented Pulmonary-no respiratory distress.  Good air movement bilaterally with no wheezing rales or rhonchi.  No coughing on exam Extremity - Neurovascular intact Sensation intact distally Intact pulses distally Dorsiflexion/Plantar flexion intact Dressing - dressing C/D/I and no drainage, Hemovac removed Motor Function - intact, moving foot and toes well on exam.  Able to straight leg raise  Past Medical History:  Diagnosis Date   Allergy    Arthritis    Carotid artery disease (HCC)    Carpal tunnel syndrome, bilateral    Chronic kidney disease, stage 2, mildly decreased GFR    Diabetes mellitus without complication (HCC)    Diverticulosis    Gastric lymphoma (HCC)     Gastroparesis    GERD (gastroesophageal reflux disease)    History of kidney stones    Hyperlipidemia    Hypertension associated with diabetes (HCC)    Hypothyroidism    Malignant lymphoma (HCC)    gastric   PUD (peptic ulcer disease)    Skin cancer    Spinal stenosis    lumbar laminectomy   Type 1 diabetes mellitus (HCC)    Urinary incontinence     Assessment/Plan:   1 Day Post-Op Procedure(s) (LRB): COMPUTER ASSISTED TOTAL KNEE ARTHROPLASTY (Left) Principal Problem:   Total knee replacement status  Estimated body mass index is 31.98 kg/m as calculated from the following:   Height as of this encounter: 5\' 3"  (1.6 m).   Weight as of this encounter: 81.9 kg. Advance diet Up with therapy Patient doing well this morning.  Pain well-controlled.  No complaints.  Vital signs are stable  Patient successfully completed all physical therapy goals and is stable and ready for discharge to home with home health PT  Follow-up with [facility] orthopedics 2 weeks postop  DVT Prophylaxis - Aspirin, TED hose, and SCDs Weight-Bearing as tolerated to left leg   T. Cranston Neighbor, PA-C [facility] 07/07/2022, 10:45 AM

## 2022-07-07 NOTE — Inpatient Diabetes Management (Signed)
Inpatient Diabetes Program Recommendations  AACE/ADA: New Consensus Statement on Inpatient Glycemic Control (2015)  Target Ranges:  Prepandial:   less than 140 mg/dL      Peak postprandial:   less than 180 mg/dL (1-2 hours)      Critically ill patients:  140 - 180 mg/dL   Lab Results  Component Value Date   GLUCAP 305 (H) 07/07/2022   HGBA1C 7.4 (H) 06/28/2022    Review of Glycemic Control  Diabetes history: type 2 Outpatient Diabetes medications: Lantus 20-26 units at HS, Novolog 4-13 units sliding scale TID Current orders for Inpatient glycemic control: Semglee 20 units at HS, Novolog 0-9 units correction scale TID  Inpatient Diabetes Program Recommendations:   Received diabetes coordinator consult. (Working remotely for complete system today) Noted that patient's fasting CBG today was 305 mg/dl. Noted that patient received total of 13 mg of Decadron in surgery yesterday which is probably the cause of her high blood sugars.  Recommend continuing current insulin orders. May need to add Novolog 4 units TID with meals if postprandial blood sugars continue to be elevated. CBGs may come down when Decadron is out of her system.   Will continue to monitor blood sugars while in the hospital.  Smith Mince RN BSN CDE Diabetes Coordinator Pager: [phone]  8am-5pm

## 2022-07-08 ENCOUNTER — Encounter: Payer: Self-pay | Admitting: Orthopedic Surgery

## 2022-07-08 ENCOUNTER — Telehealth: Payer: Self-pay | Admitting: Internal Medicine

## 2022-07-08 DIAGNOSIS — D631 Anemia in chronic kidney disease: Secondary | ICD-10-CM | POA: Diagnosis not present

## 2022-07-08 DIAGNOSIS — M5116 Intervertebral disc disorders with radiculopathy, lumbar region: Secondary | ICD-10-CM | POA: Diagnosis not present

## 2022-07-08 DIAGNOSIS — K5730 Diverticulosis of large intestine without perforation or abscess without bleeding: Secondary | ICD-10-CM | POA: Diagnosis not present

## 2022-07-08 DIAGNOSIS — M48062 Spinal stenosis, lumbar region with neurogenic claudication: Secondary | ICD-10-CM | POA: Diagnosis not present

## 2022-07-08 DIAGNOSIS — H3530 Unspecified macular degeneration: Secondary | ICD-10-CM | POA: Diagnosis not present

## 2022-07-08 DIAGNOSIS — E7800 Pure hypercholesterolemia, unspecified: Secondary | ICD-10-CM | POA: Diagnosis not present

## 2022-07-08 DIAGNOSIS — I6529 Occlusion and stenosis of unspecified carotid artery: Secondary | ICD-10-CM | POA: Diagnosis not present

## 2022-07-08 DIAGNOSIS — E1043 Type 1 diabetes mellitus with diabetic autonomic (poly)neuropathy: Secondary | ICD-10-CM | POA: Diagnosis not present

## 2022-07-08 DIAGNOSIS — E669 Obesity, unspecified: Secondary | ICD-10-CM | POA: Diagnosis not present

## 2022-07-08 DIAGNOSIS — J309 Allergic rhinitis, unspecified: Secondary | ICD-10-CM | POA: Diagnosis not present

## 2022-07-08 DIAGNOSIS — I2510 Atherosclerotic heart disease of native coronary artery without angina pectoris: Secondary | ICD-10-CM | POA: Diagnosis not present

## 2022-07-08 DIAGNOSIS — E039 Hypothyroidism, unspecified: Secondary | ICD-10-CM | POA: Diagnosis not present

## 2022-07-08 DIAGNOSIS — E1059 Type 1 diabetes mellitus with other circulatory complications: Secondary | ICD-10-CM | POA: Diagnosis not present

## 2022-07-08 DIAGNOSIS — Z85828 Personal history of other malignant neoplasm of skin: Secondary | ICD-10-CM | POA: Diagnosis not present

## 2022-07-08 DIAGNOSIS — E1022 Type 1 diabetes mellitus with diabetic chronic kidney disease: Secondary | ICD-10-CM | POA: Diagnosis not present

## 2022-07-08 DIAGNOSIS — N182 Chronic kidney disease, stage 2 (mild): Secondary | ICD-10-CM | POA: Diagnosis not present

## 2022-07-08 DIAGNOSIS — Z6831 Body mass index (BMI) 31.0-31.9, adult: Secondary | ICD-10-CM | POA: Diagnosis not present

## 2022-07-08 DIAGNOSIS — K219 Gastro-esophageal reflux disease without esophagitis: Secondary | ICD-10-CM | POA: Diagnosis not present

## 2022-07-08 DIAGNOSIS — E1069 Type 1 diabetes mellitus with other specified complication: Secondary | ICD-10-CM | POA: Diagnosis not present

## 2022-07-08 DIAGNOSIS — Z471 Aftercare following joint replacement surgery: Secondary | ICD-10-CM | POA: Diagnosis not present

## 2022-07-08 DIAGNOSIS — F419 Anxiety disorder, unspecified: Secondary | ICD-10-CM | POA: Diagnosis not present

## 2022-07-08 DIAGNOSIS — K3184 Gastroparesis: Secondary | ICD-10-CM | POA: Diagnosis not present

## 2022-07-08 DIAGNOSIS — I152 Hypertension secondary to endocrine disorders: Secondary | ICD-10-CM | POA: Diagnosis not present

## 2022-07-08 DIAGNOSIS — Z96652 Presence of left artificial knee joint: Secondary | ICD-10-CM | POA: Diagnosis not present

## 2022-07-08 NOTE — Telephone Encounter (Signed)
Called back and spoke with patient. They were just calling to let us know that her surgery went well. She has already started therapy. Has a f/u with you in August. Thanked her for the update. Just an FYI for you

## 2022-07-08 NOTE — Telephone Encounter (Signed)
Pt husband called in asking to speak to [facility] LPN. Unable to transfer. He stated he would like to let Dr. Lorin Picket knows how pt its doing.

## 2022-07-15 ENCOUNTER — Inpatient Hospital Stay: Payer: PPO | Admitting: Internal Medicine

## 2022-07-20 DIAGNOSIS — M6281 Muscle weakness (generalized): Secondary | ICD-10-CM | POA: Diagnosis not present

## 2022-07-20 DIAGNOSIS — G8929 Other chronic pain: Secondary | ICD-10-CM | POA: Diagnosis not present

## 2022-07-20 DIAGNOSIS — M25662 Stiffness of left knee, not elsewhere classified: Secondary | ICD-10-CM | POA: Diagnosis not present

## 2022-07-20 DIAGNOSIS — M25562 Pain in left knee: Secondary | ICD-10-CM | POA: Diagnosis not present

## 2022-07-23 ENCOUNTER — Other Ambulatory Visit: Payer: Self-pay | Admitting: Internal Medicine

## 2022-07-25 DIAGNOSIS — G8929 Other chronic pain: Secondary | ICD-10-CM | POA: Diagnosis not present

## 2022-07-25 DIAGNOSIS — M25662 Stiffness of left knee, not elsewhere classified: Secondary | ICD-10-CM | POA: Diagnosis not present

## 2022-07-25 DIAGNOSIS — M25562 Pain in left knee: Secondary | ICD-10-CM | POA: Diagnosis not present

## 2022-07-25 DIAGNOSIS — Z96652 Presence of left artificial knee joint: Secondary | ICD-10-CM | POA: Diagnosis not present

## 2022-07-25 DIAGNOSIS — M6281 Muscle weakness (generalized): Secondary | ICD-10-CM | POA: Diagnosis not present

## 2022-07-29 ENCOUNTER — Ambulatory Visit (INDEPENDENT_AMBULATORY_CARE_PROVIDER_SITE_OTHER): Payer: PPO | Admitting: Internal Medicine

## 2022-07-29 ENCOUNTER — Encounter: Payer: Self-pay | Admitting: Internal Medicine

## 2022-07-29 VITALS — BP 128/70 | HR 78 | Temp 97.9°F | Resp 16 | Ht 63.0 in | Wt 178.0 lb

## 2022-07-29 DIAGNOSIS — E1065 Type 1 diabetes mellitus with hyperglycemia: Secondary | ICD-10-CM

## 2022-07-29 DIAGNOSIS — R252 Cramp and spasm: Secondary | ICD-10-CM | POA: Diagnosis not present

## 2022-07-29 DIAGNOSIS — I779 Disorder of arteries and arterioles, unspecified: Secondary | ICD-10-CM

## 2022-07-29 DIAGNOSIS — E038 Other specified hypothyroidism: Secondary | ICD-10-CM

## 2022-07-29 DIAGNOSIS — E7800 Pure hypercholesterolemia, unspecified: Secondary | ICD-10-CM | POA: Diagnosis not present

## 2022-07-29 DIAGNOSIS — F419 Anxiety disorder, unspecified: Secondary | ICD-10-CM | POA: Diagnosis not present

## 2022-07-29 DIAGNOSIS — N182 Chronic kidney disease, stage 2 (mild): Secondary | ICD-10-CM

## 2022-07-29 MED ORDER — BUSPIRONE HCL 5 MG PO TABS
5.0000 mg | ORAL_TABLET | Freq: Every day | ORAL | 1 refills | Status: DC
Start: 2022-07-29 — End: 2022-08-16

## 2022-07-29 NOTE — Progress Notes (Unsigned)
Subjective:    Patient ID: Marissa Huff, female    DOB: 1931-08-21, 89 y.o.   MRN: [id number]  Patient here for  Chief Complaint  Patient presents with   Medical Management of Chronic Issues    HPI Here for work in appt. Work in for increased anxiety.  S/p left total knee arthroplasy 07/06/22. She is accompanied by her husband.  History obtained from both of them.  She is now going to outpatient therapy.  Has missed a couple of appts this week.  Feeling more anxious with PT. Marland Kitchen  Discussed importance of physical therapy and exercise and keeping moving at home. Increased stress and anxiety, feel may be contributing.  Discussed treatment.  Had previously been on zoloft.  Did not feel this helped.  No chest pain.  Breathing stable.  No nausea or vomiting reported.  Taking tramadol 50mg  bid for pain.  Continues f/u with ortho.  Downloaded her blood sugars.  CGM reviewed.  94% time active with 32% very high and 39% high.  29% in target range.  Discussed importance of diet and exercise.  Reviewed her SSI coverage.  Has f/u with Dr Gershon Crane soon.     Past Medical History:  Diagnosis Date   Allergy    Arthritis    Carotid artery disease (HCC)    Carpal tunnel syndrome, bilateral    Chronic kidney disease, stage 2, mildly decreased GFR    Diabetes mellitus without complication (HCC)    Diverticulosis    Gastric lymphoma (HCC)    Gastroparesis    GERD (gastroesophageal reflux disease)    History of kidney stones    Hyperlipidemia    Hypertension associated with diabetes (HCC)    Hypothyroidism    Malignant lymphoma (HCC)    gastric   PUD (peptic ulcer disease)    Skin cancer    Spinal stenosis    lumbar laminectomy   Type 1 diabetes mellitus (HCC)    Urinary incontinence    Past Surgical History:  Procedure Laterality Date   antrectomy and vagotomy  1994   CARPAL TUNNEL RELEASE Left 10/24/2008   CARPAL TUNNEL RELEASE Right 06/19/2009   CATARACT EXTRACTION, BILATERAL Bilateral 2014    COLONOSCOPY  05/24/2007   EYE SURGERY     bilateral cataracts   gastric lymphoma  1996   surgery   KNEE ARTHROPLASTY Left 07/06/2022   Procedure: COMPUTER ASSISTED TOTAL KNEE ARTHROPLASTY;  Surgeon: Donato Heinz, MD;  Location: [facility];  Service: Orthopedics;  Laterality: Left;   KNEE ARTHROSCOPY Left 12/05/2008   KNEE ARTHROSCOPY Right 06/16/2010   LAPAROSCOPIC CHOLECYSTECTOMY  06/1999   LUMBAR LAMINECTOMY  1999   LUMBAR LAMINECTOMY WITH COFLEX 1 LEVEL N/A 09/15/2015   Procedure: Lumbar one-two Laminectomy with placement of coflex;  Surgeon: Barnett Abu, MD;  Location: [facility];  Service: Neurosurgery;  Laterality: N/A;   PARTIAL HYSTERECTOMY  02/1978   fibroid tumors, ovaries not removed   TONSILLECTOMY AND ADENOIDECTOMY  1982   TRIGGER FINGER RELEASE Right 06/19/2009   Family History  Problem Relation Age of Onset   Stroke Mother    Stroke Father    Hypertension Father    Diabetes Brother    Multiple sclerosis Brother    Breast cancer Daughter 40   Colon cancer Neg Hx    Social History   Socioeconomic History   Marital status: Married    Spouse name: Leonette Most   Number of children: 2   Years of education: Not on  file   Highest education level: Not on file  Occupational History   Not on file  Tobacco Use   Smoking status: Never   Smokeless tobacco: Never  Vaping Use   Vaping status: Never Used  Substance and Sexual Activity   Alcohol use: No    Alcohol/week: 0.0 standard drinks of alcohol   Drug use: No   Sexual activity: Not Currently    Birth control/protection: Surgical  Other Topics Concern   Not on file  Social History Narrative   Not on file   Social Determinants of Health   Financial Resource Strain: Low Risk  (01/28/2021)   Overall Financial Resource Strain (CARDIA)    Difficulty of Paying Living Expenses: Not hard at all  Food Insecurity: No Food Insecurity (07/06/2022)   Hunger Vital Sign    Worried About Running Out of Food in the Last  Year: Never true    Ran Out of Food in the Last Year: Never true  Transportation Needs: No Transportation Needs (07/06/2022)   PRAPARE - Administrator, Civil Service (Medical): No    Lack of Transportation (Non-Medical): No  Physical Activity: Unknown (01/19/2018)   Exercise Vital Sign    Days of Exercise per Week: 0 days    Minutes of Exercise per Session: Not on file  Stress: No Stress Concern Present (01/28/2021)   [employer] of Occupational Health - Occupational Stress Questionnaire    Feeling of Stress : Not at all  Social Connections: Socially Integrated (01/28/2021)   Social Connection and Isolation Panel [NHANES]    Frequency of Communication with Friends and Family: More than three times a week    Frequency of Social Gatherings with Friends and Family: More than three times a week    Attends Religious Services: More than 4 times per year    Active Member of [employer] or Organizations: Yes    Attends Banker Meetings: Not on file    Marital Status: Married     Review of Systems  Constitutional:  Negative for appetite change and unexpected weight change.  HENT:  Negative for congestion and sinus pressure.   Respiratory:  Negative for cough, chest tightness and shortness of breath.   Cardiovascular:  Negative for chest pain, palpitations and leg swelling.  Gastrointestinal:  Negative for abdominal pain, diarrhea and vomiting.  Genitourinary:  Negative for difficulty urinating and dysuria.  Musculoskeletal:  Negative for joint swelling and myalgias.  Skin:  Negative for color change and rash.  Neurological:  Negative for dizziness and headaches.  Psychiatric/Behavioral:         Increased anxiety as outlined.  Increased stress.        Objective:     BP 128/70   Pulse 78   Temp 97.9 F (36.6 C)   Resp 16   Ht 5\' 3"  (1.6 m)   Wt 178 lb (80.7 kg)   SpO2 99%   BMI 31.53 kg/m  Wt Readings from Last 3 Encounters:  07/29/22 178 lb (80.7 kg)   07/06/22 180 lb 8.9 oz (81.9 kg)  06/30/22 180 lb 9.6 oz (81.9 kg)    Physical Exam Vitals reviewed.  Constitutional:      General: She is not in acute distress.    Appearance: Normal appearance.  HENT:     Head: Normocephalic and atraumatic.     Right Ear: External ear normal.     Left Ear: External ear normal.  Eyes:     General: No  scleral icterus.       Right eye: No discharge.        Left eye: No discharge.     Conjunctiva/sclera: Conjunctivae normal.  Neck:     Thyroid: No thyromegaly.  Cardiovascular:     Rate and Rhythm: Normal rate and regular rhythm.  Pulmonary:     Effort: No respiratory distress.     Breath sounds: Normal breath sounds. No wheezing.  Abdominal:     General: Bowel sounds are normal.     Palpations: Abdomen is soft.     Tenderness: There is no abdominal tenderness.  Musculoskeletal:        General: No swelling or tenderness.     Cervical back: Neck supple. No tenderness.  Lymphadenopathy:     Cervical: No cervical adenopathy.  Skin:    Findings: No erythema or rash.  Neurological:     Mental Status: She is alert.  Psychiatric:        Mood and Affect: Mood normal.        Behavior: Behavior normal.      Outpatient Encounter Medications as of 07/29/2022  Medication Sig   busPIRone (BUSPAR) 5 MG tablet Take 1 tablet (5 mg total) by mouth daily.   acetaminophen (TYLENOL) 325 MG tablet Take 1-2 tablets (325-650 mg total) by mouth every 6 (six) hours as needed for mild pain (pain score 1-3 or temp > 100.5).   albuterol (VENTOLIN HFA) 108 (90 Base) MCG/ACT inhaler TAKE 2 PUFFS BY MOUTH EVERY 6 HOURS AS NEEDED FOR WHEEZE OR SHORTNESS OF BREATH   amLODipine (NORVASC) 5 MG tablet TAKE 1 TABLET (5 MG TOTAL) BY MOUTH DAILY.   aspirin 81 MG chewable tablet Chew 1 tablet (81 mg total) by mouth 2 (two) times daily.   clobetasol (TEMOVATE) 0.05 % external solution Apply 1 Application topically 2 (two) times daily.   Continuous Blood Gluc Receiver  (FREESTYLE LIBRE 14 DAY READER) DEVI Use 1 each as needed .  Use to test blood sugar   Continuous Blood Gluc Sensor (FREESTYLE LIBRE 14 DAY SENSOR) MISC Use 1 each every 14 (fourteen) days   dextromethorphan-guaiFENesin (MUCINEX DM) 30-600 MG 12hr tablet Take 1 tablet by mouth daily as needed for cough.   diphenhydrAMINE (BENADRYL) 25 MG tablet Take 25 mg by mouth at bedtime as needed.   fexofenadine (ALLEGRA) 180 MG tablet Take 180 mg by mouth daily.   fluticasone (FLONASE) 50 MCG/ACT nasal spray SPRAY 2 SPRAYS INTO EACH NOSTRIL EVERY DAY (Patient taking differently: Place 1 spray into both nostrils at bedtime. SPRAY 2 SPRAYS INTO EACH NOSTRIL EVERY DAY)   glucagon (GLUCAGON EMERGENCY) 1 MG injection Inject 1 mg into the muscle once as needed.   insulin aspart (NOVOLOG FLEXPEN) 100 UNIT/ML FlexPen Inject 4-13 Units into the skin 3 (three) times daily with meals. Sliding scale   ketoconazole (NIZORAL) 2 % shampoo Apply 1 Application topically 3 (three) times a week.   LANTUS SOLOSTAR 100 UNIT/ML Solostar Pen Inject 20-26 Units into the skin at bedtime. Sliding scale   levothyroxine (SYNTHROID) 200 MCG tablet TAKE 1 TABLET (200 MCG TOTAL) BY MOUTH DAILY BEFORE BREAKFAST.   MULTIPLE VITAMINS-MINERALS PO Take 1 tablet by mouth daily.   Olopatadine HCl (PATADAY) 0.7 % SOLN Apply 1 drop to eye daily as needed.   ondansetron (ZOFRAN) 4 MG tablet Take 1 tablet (4 mg total) by mouth every 6 (six) hours as needed for nausea.   OVER THE COUNTER MEDICATION Take 2 capsules by mouth  daily. Cerebra Brain Health   OVER THE COUNTER MEDICATION Take 2 capsules by mouth daily as needed. Gluco Switch Blood sugar support   pantoprazole (PROTONIX) 40 MG tablet TAKE 1 TABLET BY MOUTH TWICE A DAY   Polyethyl Glycol-Propyl Glycol (SYSTANE OP) Place 1 drop into both eyes at bedtime.   Polyethylene Glycol 3350 (MIRALAX PO) Take 1 Dose by mouth daily as needed.   rosuvastatin (CRESTOR) 5 MG tablet TAKE 1 TABLET BY MOUTH  THREE TIMES A WEEK. (Patient taking differently: Take 5 mg by mouth 3 (three) times a week. MONDAY, WEDNESDAY, FRIDAY)   senna-docusate (SENOKOT-S) 8.6-50 MG tablet Take 1 tablet by mouth 2 (two) times daily.   traMADol (ULTRAM) 50 MG tablet Take 1 tablet (50 mg total) by mouth every 4 (four) hours as needed for moderate pain.   valsartan-hydrochlorothiazide (DIOVAN-HCT) 160-12.5 MG tablet TAKE 1 TABLET BY MOUTH EVERY DAY   Wheat Dextrin (BENEFIBER) POWD Take 1 Dose by mouth daily as needed.   [DISCONTINUED] oxyCODONE (OXY IR/ROXICODONE) 5 MG immediate release tablet Take 1 tablet (5 mg total) by mouth every 4 (four) hours as needed for moderate pain (pain score 4-6).   No facility-administered encounter medications on file as of 07/29/2022.     Lab Results  Component Value Date   WBC 5.8 06/28/2022   HGB 11.6 (L) 06/28/2022   HCT 35.1 (L) 06/28/2022   PLT 226 06/28/2022   GLUCOSE 182 (H) 07/29/2022   CHOL 114 02/25/2022   TRIG 59.0 02/25/2022   HDL 52.20 02/25/2022   LDLCALC 50 02/25/2022   ALT 16 06/28/2022   AST 21 06/28/2022   NA 139 07/29/2022   K 4.0 07/29/2022   CL 101 07/29/2022   CREATININE 1.07 (H) 07/29/2022   BUN 24 07/29/2022   CO2 21 07/29/2022   TSH 0.92 03/02/2022   HGBA1C 7.4 (H) 06/28/2022   MICROALBUR 2.3 (H) 07/16/2021    DG Knee Left Port  Result Date: 07/06/2022 CLINICAL DATA:  Sepsis left total knee replacement. EXAM: PORTABLE LEFT KNEE - 1-2 VIEW COMPARISON:  03/19/2020 FINDINGS: Status post tricompartmental left knee replacement. No evidence for immediate hardware complication. Surgical drains visualized in situ. Skin staples overlie the anterior knee. IMPRESSION: Status post tricompartmental left knee replacement without evidence for immediate hardware complication. Electronically Signed   By: Kennith Center M.D.   On: 07/06/2022 18:16       Assessment & Plan:  Muscle cramps -     BASIC METABOLIC PANEL WITH eGFR -     Magnesium  Anxiety Assessment  & Plan: Increased stress and anxiety as outlined.  Discussed.  Discussed importance of attending PT sessions and doing exercise at home.  Also discussed treatment options.  Previously on zoloft.  Did not feel helped.  Discussed trial of buspar.  Start with 5mg  dialy.  Titrate as needed.  Follow.    Carotid artery disease, unspecified laterality (HCC) Assessment & Plan: Carotid duplex 02/2022 reveals some progression on the right side with common carotid artery stenosis that now appears to be 50% or greater and internal carotid artery stenosis that remains in the 1 to 39% range on the right.  The left internal carotid artery at the 1 to 39% range and stable from previous studies.  No role for intervention in an asymptomatic 89 year old.  Continue current medical regimen which includes Crestor and aspirin.  Recheck in 1 year.    CKD (chronic kidney disease) stage 2, GFR 60-89 ml/min Assessment & Plan: Avoid antiinflammatories.  Follow metabolic panel.    Hypercholesterolemia Assessment & Plan: Continue crestor.  Low cholesterol diet and exercise.  Follow lipid panel and liver function tests.   Lab Results  Component Value Date   CHOL 114 02/25/2022   HDL 52.20 02/25/2022   LDLCALC 50 02/25/2022   TRIG 59.0 02/25/2022   CHOLHDL 2 02/25/2022     Other specified hypothyroidism Assessment & Plan: On synthroid.  Follow tsh.    Type 1 diabetes mellitus with hyperglycemia (HCC) Assessment & Plan: Followed by endocrinology.  Currently on lantus and uses novolog SSI.  Sugars as outlined.  CGM reviewed.  Elevated.  Discussed diet and exercise.  Has f/u soon with Dr Gershon Crane.    Other orders -     busPIRone HCl; Take 1 tablet (5 mg total) by mouth daily.  Dispense: 30 tablet; Refill: 1     Dale [place], MD

## 2022-07-31 ENCOUNTER — Encounter: Payer: Self-pay | Admitting: Internal Medicine

## 2022-07-31 NOTE — Assessment & Plan Note (Signed)
Increased stress and anxiety as outlined.  Discussed.  Discussed importance of attending PT sessions and doing exercise at home.  Also discussed treatment options.  Previously on zoloft.  Did not feel helped.  Discussed trial of buspar.  Start with 5mg  dialy.  Titrate as needed.  Follow.

## 2022-07-31 NOTE — Assessment & Plan Note (Signed)
On synthroid.  Follow tsh.   

## 2022-07-31 NOTE — Assessment & Plan Note (Signed)
Continue crestor.  Low cholesterol diet and exercise.  Follow lipid panel and liver function tests.   Lab Results  Component Value Date   CHOL 114 02/25/2022   HDL 52.20 02/25/2022   LDLCALC 50 02/25/2022   TRIG 59.0 02/25/2022   CHOLHDL 2 02/25/2022

## 2022-07-31 NOTE — Assessment & Plan Note (Signed)
Avoid antiinflammatories.  Follow metabolic panel.  

## 2022-07-31 NOTE — Assessment & Plan Note (Signed)
Followed by endocrinology.  Currently on lantus and uses novolog SSI.  Sugars as outlined.  CGM reviewed.  Elevated.  Discussed diet and exercise.  Has f/u soon with Dr Gershon Crane.

## 2022-07-31 NOTE — Assessment & Plan Note (Signed)
Carotid duplex 02/2022 reveals some progression on the right side with common carotid artery stenosis that now appears to be 50% or greater and internal carotid artery stenosis that remains in the 1 to 39% range on the right.  The left internal carotid artery at the 1 to 39% range and stable from previous studies.  No role for intervention in an asymptomatic 89 year old.  Continue current medical regimen which includes Crestor and aspirin.  Recheck in 1 year.

## 2022-08-01 DIAGNOSIS — M25562 Pain in left knee: Secondary | ICD-10-CM | POA: Diagnosis not present

## 2022-08-01 DIAGNOSIS — Z96652 Presence of left artificial knee joint: Secondary | ICD-10-CM | POA: Diagnosis not present

## 2022-08-01 DIAGNOSIS — M25662 Stiffness of left knee, not elsewhere classified: Secondary | ICD-10-CM | POA: Diagnosis not present

## 2022-08-01 DIAGNOSIS — G8929 Other chronic pain: Secondary | ICD-10-CM | POA: Diagnosis not present

## 2022-08-01 DIAGNOSIS — M6281 Muscle weakness (generalized): Secondary | ICD-10-CM | POA: Diagnosis not present

## 2022-08-02 ENCOUNTER — Telehealth: Payer: Self-pay

## 2022-08-02 DIAGNOSIS — R809 Proteinuria, unspecified: Secondary | ICD-10-CM | POA: Diagnosis not present

## 2022-08-02 DIAGNOSIS — E1029 Type 1 diabetes mellitus with other diabetic kidney complication: Secondary | ICD-10-CM | POA: Diagnosis not present

## 2022-08-02 DIAGNOSIS — E039 Hypothyroidism, unspecified: Secondary | ICD-10-CM | POA: Diagnosis not present

## 2022-08-02 DIAGNOSIS — R944 Abnormal results of kidney function studies: Secondary | ICD-10-CM

## 2022-08-02 DIAGNOSIS — I152 Hypertension secondary to endocrine disorders: Secondary | ICD-10-CM | POA: Diagnosis not present

## 2022-08-02 DIAGNOSIS — E785 Hyperlipidemia, unspecified: Secondary | ICD-10-CM | POA: Diagnosis not present

## 2022-08-02 DIAGNOSIS — E1069 Type 1 diabetes mellitus with other specified complication: Secondary | ICD-10-CM | POA: Diagnosis not present

## 2022-08-02 DIAGNOSIS — E1159 Type 2 diabetes mellitus with other circulatory complications: Secondary | ICD-10-CM | POA: Diagnosis not present

## 2022-08-02 NOTE — Telephone Encounter (Signed)
-----   Message from [place] sent at 07/31/2022  1:07 AM EDT ----- Notify - magnesium level is wnl.  Kidney function has decreased.  Needs to stay hydrated.  Avoid antiinflammatory medication.  We will follow.  Recheck met b and urinalysis (dx decreased GFR) - in 2-3 weeks.

## 2022-08-03 ENCOUNTER — Encounter (INDEPENDENT_AMBULATORY_CARE_PROVIDER_SITE_OTHER): Payer: Self-pay

## 2022-08-03 DIAGNOSIS — Z96652 Presence of left artificial knee joint: Secondary | ICD-10-CM | POA: Diagnosis not present

## 2022-08-03 DIAGNOSIS — M25562 Pain in left knee: Secondary | ICD-10-CM | POA: Diagnosis not present

## 2022-08-03 DIAGNOSIS — M6281 Muscle weakness (generalized): Secondary | ICD-10-CM | POA: Diagnosis not present

## 2022-08-03 DIAGNOSIS — M25662 Stiffness of left knee, not elsewhere classified: Secondary | ICD-10-CM | POA: Diagnosis not present

## 2022-08-03 DIAGNOSIS — G8929 Other chronic pain: Secondary | ICD-10-CM | POA: Diagnosis not present

## 2022-08-09 ENCOUNTER — Encounter: Payer: Self-pay | Admitting: *Deleted

## 2022-08-09 ENCOUNTER — Telehealth: Payer: Self-pay | Admitting: *Deleted

## 2022-08-09 NOTE — Telephone Encounter (Addendum)
-----   Message from [place] sent at 07/31/2022  1:07 AM EDT ----- Notify - magnesium level is wnl.  Kidney function has decreased.  Needs to stay hydrated.  Avoid antiinflammatory medication.  We will follow.  Recheck met b and urinalysis (dx decreased GFR) - in 2-3 weeks.(Can de done at next OV on 08/16/22)

## 2022-08-09 NOTE — Telephone Encounter (Addendum)
Sent mychart for pt to call for results. I tried to call pt & was told I had the wrong number. See below  Please verify we have correct contact info also

## 2022-08-16 ENCOUNTER — Encounter: Payer: Self-pay | Admitting: Internal Medicine

## 2022-08-16 ENCOUNTER — Ambulatory Visit: Payer: PPO | Admitting: Internal Medicine

## 2022-08-16 VITALS — BP 128/72 | HR 80 | Temp 97.9°F | Resp 16 | Ht 63.0 in | Wt 179.0 lb

## 2022-08-16 DIAGNOSIS — E038 Other specified hypothyroidism: Secondary | ICD-10-CM

## 2022-08-16 DIAGNOSIS — I779 Disorder of arteries and arterioles, unspecified: Secondary | ICD-10-CM

## 2022-08-16 DIAGNOSIS — E7800 Pure hypercholesterolemia, unspecified: Secondary | ICD-10-CM | POA: Diagnosis not present

## 2022-08-16 DIAGNOSIS — E1065 Type 1 diabetes mellitus with hyperglycemia: Secondary | ICD-10-CM | POA: Diagnosis not present

## 2022-08-16 DIAGNOSIS — E1069 Type 1 diabetes mellitus with other specified complication: Secondary | ICD-10-CM | POA: Diagnosis not present

## 2022-08-16 DIAGNOSIS — K219 Gastro-esophageal reflux disease without esophagitis: Secondary | ICD-10-CM

## 2022-08-16 DIAGNOSIS — M4800 Spinal stenosis, site unspecified: Secondary | ICD-10-CM

## 2022-08-16 DIAGNOSIS — C8599 Non-Hodgkin lymphoma, unspecified, extranodal and solid organ sites: Secondary | ICD-10-CM

## 2022-08-16 DIAGNOSIS — Z1231 Encounter for screening mammogram for malignant neoplasm of breast: Secondary | ICD-10-CM

## 2022-08-16 DIAGNOSIS — Z471 Aftercare following joint replacement surgery: Secondary | ICD-10-CM | POA: Diagnosis not present

## 2022-08-16 DIAGNOSIS — R413 Other amnesia: Secondary | ICD-10-CM

## 2022-08-16 DIAGNOSIS — I10 Essential (primary) hypertension: Secondary | ICD-10-CM | POA: Diagnosis not present

## 2022-08-16 DIAGNOSIS — E785 Hyperlipidemia, unspecified: Secondary | ICD-10-CM

## 2022-08-16 DIAGNOSIS — R944 Abnormal results of kidney function studies: Secondary | ICD-10-CM

## 2022-08-16 DIAGNOSIS — N182 Chronic kidney disease, stage 2 (mild): Secondary | ICD-10-CM | POA: Diagnosis not present

## 2022-08-16 DIAGNOSIS — F419 Anxiety disorder, unspecified: Secondary | ICD-10-CM

## 2022-08-16 LAB — HM DIABETES FOOT EXAM

## 2022-08-16 MED ORDER — BUSPIRONE HCL 5 MG PO TABS: 5.0000 mg | ORAL_TABLET | Freq: Two times a day (BID) | ORAL | 1 refills | Status: DC

## 2022-08-16 NOTE — Progress Notes (Signed)
Subjective:    Patient ID: Marissa Huff, female    DOB: Feb 21, 1931, 89 y.o.   MRN: [id number]  Patient here for  Chief Complaint  Patient presents with   Medical Management of Chronic Issues    HPI Here to follow up regarding hypercholesterolemia, diabetes and increased stress/anxiety. S/p left total knee arthroplasy 07/06/22. She is accompanied by her husband. History obtained from both of them. Recently evaluated for increased anxiety.  Specifically anxious with PT.  Started on buspar. Tolerating, but cannot tell has made a big difference.  Discussed increasing dose.  Has f/u with Dr Ernest Pine this week.  Discussed the need to continue to get up and move and do her exercises.  Saw Dr Gershon Crane 08/02/22 - lantus - 36 units daily. Continues on SSI - humalog with meals. Reviewed sugars - 14 day average 182 and 30 day average 192.  No chest pain reported.  Breathing overall stable.  Seeing physiatry - L5-S1 ESI.     Past Medical History:  Diagnosis Date   Allergy    Arthritis    Carotid artery disease (HCC)    Carpal tunnel syndrome, bilateral    Chronic kidney disease, stage 2, mildly decreased GFR    Diabetes mellitus without complication (HCC)    Diverticulosis    Gastric lymphoma (HCC)    Gastroparesis    GERD (gastroesophageal reflux disease)    History of kidney stones    Hyperlipidemia    Hypertension associated with diabetes (HCC)    Hypothyroidism    Malignant lymphoma (HCC)    gastric   PUD (peptic ulcer disease)    Skin cancer    Spinal stenosis    lumbar laminectomy   Type 1 diabetes mellitus (HCC)    Urinary incontinence    Past Surgical History:  Procedure Laterality Date   antrectomy and vagotomy  1994   CARPAL TUNNEL RELEASE Left 10/24/2008   CARPAL TUNNEL RELEASE Right 06/19/2009   CATARACT EXTRACTION, BILATERAL Bilateral 2014   COLONOSCOPY  05/24/2007   EYE SURGERY     bilateral cataracts   gastric lymphoma  1996   surgery   KNEE ARTHROPLASTY Left  07/06/2022   Procedure: COMPUTER ASSISTED TOTAL KNEE ARTHROPLASTY;  Surgeon: Donato Heinz, MD;  Location: [facility];  Service: Orthopedics;  Laterality: Left;   KNEE ARTHROSCOPY Left 12/05/2008   KNEE ARTHROSCOPY Right 06/16/2010   LAPAROSCOPIC CHOLECYSTECTOMY  06/1999   LUMBAR LAMINECTOMY  1999   LUMBAR LAMINECTOMY WITH COFLEX 1 LEVEL N/A 09/15/2015   Procedure: Lumbar one-two Laminectomy with placement of coflex;  Surgeon: Barnett Abu, MD;  Location: [facility];  Service: Neurosurgery;  Laterality: N/A;   PARTIAL HYSTERECTOMY  02/1978   fibroid tumors, ovaries not removed   TONSILLECTOMY AND ADENOIDECTOMY  1982   TRIGGER FINGER RELEASE Right 06/19/2009   Family History  Problem Relation Age of Onset   Stroke Mother    Stroke Father    Hypertension Father    Diabetes Brother    Multiple sclerosis Brother    Breast cancer Daughter 47   Colon cancer Neg Hx    Social History   Socioeconomic History   Marital status: Married    Spouse name: Leonette Most   Number of children: 2   Years of education: Not on file   Highest education level: Not on file  Occupational History   Not on file  Tobacco Use   Smoking status: Never   Smokeless tobacco: Never  Vaping Use  Vaping status: Never Used  Substance and Sexual Activity   Alcohol use: No    Alcohol/week: 0.0 standard drinks of alcohol   Drug use: No   Sexual activity: Not Currently    Birth control/protection: Surgical  Other Topics Concern   Not on file  Social History Narrative   Not on file   Social Determinants of Health   Financial Resource Strain: Low Risk  (01/28/2021)   Overall Financial Resource Strain (CARDIA)    Difficulty of Paying Living Expenses: Not hard at all  Food Insecurity: No Food Insecurity (07/06/2022)   Hunger Vital Sign    Worried About Running Out of Food in the Last Year: Never true    Ran Out of Food in the Last Year: Never true  Transportation Needs: No Transportation Needs (07/06/2022)    PRAPARE - Administrator, Civil Service (Medical): No    Lack of Transportation (Non-Medical): No  Physical Activity: Unknown (01/19/2018)   Exercise Vital Sign    Days of Exercise per Week: 0 days    Minutes of Exercise per Session: Not on file  Stress: No Stress Concern Present (01/28/2021)   [employer] of Occupational Health - Occupational Stress Questionnaire    Feeling of Stress : Not at all  Social Connections: Socially Integrated (01/28/2021)   Social Connection and Isolation Panel [NHANES]    Frequency of Communication with Friends and Family: More than three times a week    Frequency of Social Gatherings with Friends and Family: More than three times a week    Attends Religious Services: More than 4 times per year    Active Member of [employer] or Organizations: Yes    Attends Banker Meetings: Not on file    Marital Status: Married     Review of Systems  Constitutional:  Negative for appetite change and unexpected weight change.  HENT:  Negative for congestion and sinus pressure.   Respiratory:  Negative for cough and chest tightness.        Breathing stable.   Cardiovascular:  Negative for chest pain and palpitations.       No increased swelling.   Gastrointestinal:  Negative for abdominal pain, diarrhea, nausea and vomiting.  Genitourinary:  Negative for difficulty urinating and dysuria.  Musculoskeletal:        F/u Dr Ernest Pine this week - f/u left knee.    Skin:  Negative for color change and rash.  Neurological:  Negative for dizziness and headaches.  Psychiatric/Behavioral:  Negative for dysphoric mood.        Increased stress / anxiety as outlined.        Objective:     BP 128/72   Pulse 80   Temp 97.9 F (36.6 C)   Resp 16   Ht 5\' 3"  (1.6 m)   Wt 179 lb (81.2 kg)   SpO2 99%   BMI 31.71 kg/m  Wt Readings from Last 3 Encounters:  08/16/22 179 lb (81.2 kg)  07/29/22 178 lb (80.7 kg)  07/06/22 180 lb 8.9 oz (81.9 kg)     Physical Exam Vitals reviewed.  Constitutional:      General: She is not in acute distress.    Appearance: Normal appearance. She is well-developed.  HENT:     Head: Normocephalic and atraumatic.     Right Ear: External ear normal.     Left Ear: External ear normal.  Eyes:     General: No scleral icterus.  Right eye: No discharge.        Left eye: No discharge.     Conjunctiva/sclera: Conjunctivae normal.  Neck:     Thyroid: No thyromegaly.  Cardiovascular:     Rate and Rhythm: Normal rate and regular rhythm.  Pulmonary:     Effort: No tachypnea, accessory muscle usage or respiratory distress.     Breath sounds: Normal breath sounds. No decreased breath sounds or wheezing.  Chest:  Breasts:    Right: No inverted nipple, mass, nipple discharge or tenderness (no axillary adenopathy).     Left: No inverted nipple, mass, nipple discharge or tenderness (no axilarry adenopathy).  Abdominal:     General: Bowel sounds are normal.     Palpations: Abdomen is soft.     Tenderness: There is no abdominal tenderness.  Musculoskeletal:        General: No tenderness.     Cervical back: Neck supple. No tenderness.     Comments: No increased edema.   Lymphadenopathy:     Cervical: No cervical adenopathy.  Skin:    Findings: No erythema or rash.  Neurological:     Mental Status: She is alert and oriented to person, place, and time.  Psychiatric:        Mood and Affect: Mood normal.        Behavior: Behavior normal.      Outpatient Encounter Medications as of 08/16/2022  Medication Sig   acetaminophen (TYLENOL) 325 MG tablet Take 1-2 tablets (325-650 mg total) by mouth every 6 (six) hours as needed for mild pain (pain score 1-3 or temp > 100.5).   amLODipine (NORVASC) 5 MG tablet TAKE 1 TABLET (5 MG TOTAL) BY MOUTH DAILY.   [EXPIRED] aspirin 81 MG chewable tablet Chew 1 tablet (81 mg total) by mouth 2 (two) times daily.   busPIRone (BUSPAR) 5 MG tablet Take 1 tablet (5 mg  total) by mouth 2 (two) times daily.   clobetasol (TEMOVATE) 0.05 % external solution Apply 1 Application topically 2 (two) times daily.   Continuous Blood Gluc Receiver (FREESTYLE LIBRE 14 DAY READER) DEVI Use 1 each as needed .  Use to test blood sugar   Continuous Blood Gluc Sensor (FREESTYLE LIBRE 14 DAY SENSOR) MISC Use 1 each every 14 (fourteen) days   dextromethorphan-guaiFENesin (MUCINEX DM) 30-600 MG 12hr tablet Take 1 tablet by mouth daily as needed for cough.   diphenhydrAMINE (BENADRYL) 25 MG tablet Take 25 mg by mouth at bedtime as needed.   fexofenadine (ALLEGRA) 180 MG tablet Take 180 mg by mouth daily.   fluticasone (FLONASE) 50 MCG/ACT nasal spray SPRAY 2 SPRAYS INTO EACH NOSTRIL EVERY DAY (Patient taking differently: Place 1 spray into both nostrils at bedtime. SPRAY 2 SPRAYS INTO EACH NOSTRIL EVERY DAY)   glucagon (GLUCAGON EMERGENCY) 1 MG injection Inject 1 mg into the muscle once as needed.   insulin aspart (NOVOLOG FLEXPEN) 100 UNIT/ML FlexPen Inject 4-13 Units into the skin 3 (three) times daily with meals. Sliding scale   ketoconazole (NIZORAL) 2 % shampoo Apply 1 Application topically 3 (three) times a week.   LANTUS SOLOSTAR 100 UNIT/ML Solostar Pen Inject 20-26 Units into the skin at bedtime. Sliding scale   levothyroxine (SYNTHROID) 200 MCG tablet TAKE 1 TABLET (200 MCG TOTAL) BY MOUTH DAILY BEFORE BREAKFAST.   MULTIPLE VITAMINS-MINERALS PO Take 1 tablet by mouth daily.   Olopatadine HCl (PATADAY) 0.7 % SOLN Apply 1 drop to eye daily as needed.   ondansetron (ZOFRAN) 4 MG  tablet Take 1 tablet (4 mg total) by mouth every 6 (six) hours as needed for nausea.   OVER THE COUNTER MEDICATION Take 2 capsules by mouth daily. Cerebra Brain Health   OVER THE COUNTER MEDICATION Take 2 capsules by mouth daily as needed. Gluco Switch Blood sugar support   Polyethyl Glycol-Propyl Glycol (SYSTANE OP) Place 1 drop into both eyes at bedtime.   Polyethylene Glycol 3350 (MIRALAX PO) Take 1  Dose by mouth daily as needed.   rosuvastatin (CRESTOR) 5 MG tablet TAKE 1 TABLET BY MOUTH THREE TIMES A WEEK. (Patient taking differently: Take 5 mg by mouth 3 (three) times a week. MONDAY, WEDNESDAY, FRIDAY)   senna-docusate (SENOKOT-S) 8.6-50 MG tablet Take 1 tablet by mouth 2 (two) times daily.   traMADol (ULTRAM) 50 MG tablet Take 1 tablet (50 mg total) by mouth every 4 (four) hours as needed for moderate pain.   Wheat Dextrin (BENEFIBER) POWD Take 1 Dose by mouth daily as needed.   [DISCONTINUED] albuterol (VENTOLIN HFA) 108 (90 Base) MCG/ACT inhaler TAKE 2 PUFFS BY MOUTH EVERY 6 HOURS AS NEEDED FOR WHEEZE OR SHORTNESS OF BREATH   [DISCONTINUED] busPIRone (BUSPAR) 5 MG tablet Take 1 tablet (5 mg total) by mouth daily.   [DISCONTINUED] pantoprazole (PROTONIX) 40 MG tablet TAKE 1 TABLET BY MOUTH TWICE A DAY   [DISCONTINUED] valsartan-hydrochlorothiazide (DIOVAN-HCT) 160-12.5 MG tablet TAKE 1 TABLET BY MOUTH EVERY DAY   No facility-administered encounter medications on file as of 08/16/2022.     Lab Results  Component Value Date   WBC 5.8 06/28/2022   HGB 11.6 (L) 06/28/2022   HCT 35.1 (L) 06/28/2022   PLT 226 06/28/2022   GLUCOSE 121 (H) 08/16/2022   CHOL 114 02/25/2022   TRIG 59.0 02/25/2022   HDL 52.20 02/25/2022   LDLCALC 50 02/25/2022   ALT 16 06/28/2022   AST 21 06/28/2022   NA 136 08/16/2022   K 3.7 08/16/2022   CL 103 08/16/2022   CREATININE 0.85 08/16/2022   BUN 19 08/16/2022   CO2 24 08/16/2022   TSH 0.92 03/02/2022   HGBA1C 7.4 (H) 06/28/2022   MICROALBUR 2.3 (H) 07/16/2021    DG Knee Left Port  Result Date: 07/06/2022 CLINICAL DATA:  Sepsis left total knee replacement. EXAM: PORTABLE LEFT KNEE - 1-2 VIEW COMPARISON:  03/19/2020 FINDINGS: Status post tricompartmental left knee replacement. No evidence for immediate hardware complication. Surgical drains visualized in situ. Skin staples overlie the anterior knee. IMPRESSION: Status post tricompartmental left knee  replacement without evidence for immediate hardware complication. Electronically Signed   By: Kennith Center M.D.   On: 07/06/2022 18:16       Assessment & Plan:  Decreased GFR -     Basic metabolic panel -     Urinalysis, Routine w reflex microscopic  Visit for screening mammogram  Type 1 diabetes mellitus with hyperglycemia (HCC) Assessment & Plan: Followed by endocrinology.  Currently on lantus and uses novolog SSI.  Sugars as outlined.  Elevated.  Discussed diet and exercise. Continue f/u with Dr Gershon Crane.    Hypercholesterolemia Assessment & Plan: Continue crestor.  Low cholesterol diet and exercise.  Follow lipid panel and liver function tests.   Lab Results  Component Value Date   CHOL 114 02/25/2022   HDL 52.20 02/25/2022   LDLCALC 50 02/25/2022   TRIG 59.0 02/25/2022   CHOLHDL 2 02/25/2022     Hyperlipidemia due to type 1 diabetes mellitus (HCC) Assessment & Plan: Continue crestor.  Low cholesterol diet  and exercise.  Follow lipid panel and liver function tests.  Followed by endocrinology.  Currently on lantus and uses novolog SSI.  Sugars as outlined.  Elevated.  Discussed diet and exercise. Continue f/u with Dr Gershon Crane.    Gastric lymphoma (HCC) Assessment & Plan: History of gastric lymphoma. miralax has helped bowels.  Was previously referred back to GI for evaluation and question of need for EGD given history.  Saw GI 07/08/21 - ordered UGI with barium swallow.  Pt had previously preferred EGD. Have discussed need for f/u with GI.  F/u phone call with GI reviewed.  Pt doing well.  Elected to follow.      Carotid artery disease, unspecified laterality (HCC) Assessment & Plan: Carotid duplex 02/2022 reveals some progression on the right side with common carotid artery stenosis that now appears to be 50% or greater and internal carotid artery stenosis that remains in the 1 to 39% range on the right.  The left internal carotid artery at the 1 to 39% range and stable  from previous studies.  No role for intervention in an asymptomatic 89 year old.  Continue current medical regimen which includes Crestor and aspirin.  Recheck in 1 year.    Anxiety Assessment & Plan: Increased stress and anxiety as outlined.  Discussed.  Discussed importance of attending PT sessions and doing exercise at home. Previously on zoloft.  Did not feel helped.  Discussed last visit. Started with 5mg  dialy - buspar last visit.  Increase to bid dosing.  Call with update. Titrate as needed.  Follow.    CKD (chronic kidney disease) stage 2, GFR 60-89 ml/min Assessment & Plan: Avoid antiinflammatories.  Follow metabolic panel.    Gastroesophageal reflux disease, unspecified whether esophagitis present Assessment & Plan: Continue on protonix.     Primary hypertension Assessment & Plan: Currently on valsartan/hctz and amlodipine. Blood pressure as outlined.  Continue current medications.  Follow metabolic panel.     Other specified hypothyroidism Assessment & Plan: On synthroid.  Follow tsh.    Memory change Assessment & Plan: Concern regarding change in memory and thinking as outlined. Have referred to neurology.     Spinal stenosis, unspecified spinal region Assessment & Plan: Saw Dr Mariah Milling recently.  Prescribed tramadol.  Takes prn. Continue f/u.    Other orders -     busPIRone HCl; Take 1 tablet (5 mg total) by mouth 2 (two) times daily.  Dispense: 60 tablet; Refill: 1     [place] Mc[place]ray, MD

## 2022-08-17 ENCOUNTER — Other Ambulatory Visit: Payer: Self-pay | Admitting: Internal Medicine

## 2022-08-18 ENCOUNTER — Telehealth: Payer: Self-pay

## 2022-08-18 DIAGNOSIS — Z96652 Presence of left artificial knee joint: Secondary | ICD-10-CM | POA: Diagnosis not present

## 2022-08-18 NOTE — Telephone Encounter (Signed)
Medication was discontinued on 03/02/2022. Is it okay to refuse refill?

## 2022-08-18 NOTE — Telephone Encounter (Signed)
-----   Message from [place] sent at 08/18/2022  5:02 AM EDT ----- Please call and notify - kidney function improved from last check.  Urine ok.

## 2022-08-21 ENCOUNTER — Encounter: Payer: Self-pay | Admitting: Internal Medicine

## 2022-08-21 NOTE — Assessment & Plan Note (Signed)
Saw Dr Mariah Milling recently.  Prescribed tramadol.  Takes prn. Continue f/u.

## 2022-08-21 NOTE — Assessment & Plan Note (Signed)
Avoid antiinflammatories.  Follow metabolic panel.  

## 2022-08-21 NOTE — Assessment & Plan Note (Addendum)
Continue crestor.  Low cholesterol diet and exercise.  Follow lipid panel and liver function tests.  Followed by endocrinology.  Currently on lantus and uses novolog SSI.  Sugars as outlined.  Elevated.  Discussed diet and exercise. Continue f/u with Dr Gershon Crane.

## 2022-08-21 NOTE — Assessment & Plan Note (Signed)
History of gastric lymphoma. miralax has helped bowels.  Was previously referred back to GI for evaluation and question of need for EGD given history.  Saw GI 07/08/21 - ordered UGI with barium swallow.  Pt had previously preferred EGD. Have discussed need for f/u with GI.  F/u phone call with GI reviewed.  Pt doing well.  Elected to follow.

## 2022-08-21 NOTE — Assessment & Plan Note (Signed)
On synthroid.  Follow tsh.   

## 2022-08-21 NOTE — Assessment & Plan Note (Signed)
Currently on valsartan/hctz and amlodipine. Blood pressure as outlined.  Continue current medications.  Follow metabolic panel.

## 2022-08-21 NOTE — Assessment & Plan Note (Signed)
Increased stress and anxiety as outlined.  Discussed.  Discussed importance of attending PT sessions and doing exercise at home. Previously on zoloft.  Did not feel helped.  Discussed last visit. Started with 5mg  dialy - buspar last visit.  Increase to bid dosing.  Call with update. Titrate as needed.  Follow.

## 2022-08-21 NOTE — Assessment & Plan Note (Signed)
Carotid duplex 02/2022 reveals some progression on the right side with common carotid artery stenosis that now appears to be 50% or greater and internal carotid artery stenosis that remains in the 1 to 39% range on the right.  The left internal carotid artery at the 1 to 39% range and stable from previous studies.  No role for intervention in an asymptomatic 89 year old.  Continue current medical regimen which includes Crestor and aspirin.  Recheck in 1 year.

## 2022-08-21 NOTE — Assessment & Plan Note (Signed)
Continue crestor.  Low cholesterol diet and exercise.  Follow lipid panel and liver function tests.   Lab Results  Component Value Date   CHOL 114 02/25/2022   HDL 52.20 02/25/2022   LDLCALC 50 02/25/2022   TRIG 59.0 02/25/2022   CHOLHDL 2 02/25/2022

## 2022-08-21 NOTE — Assessment & Plan Note (Signed)
Continue on protonix.

## 2022-08-21 NOTE — Assessment & Plan Note (Signed)
Concern regarding change in memory and thinking as outlined. Have referred to neurology.

## 2022-08-21 NOTE — Assessment & Plan Note (Signed)
Followed by endocrinology.  Currently on lantus and uses novolog SSI.  Sugars as outlined.  Elevated.  Discussed diet and exercise. Continue f/u with Dr Gershon Crane.

## 2022-09-01 DIAGNOSIS — M6281 Muscle weakness (generalized): Secondary | ICD-10-CM | POA: Diagnosis not present

## 2022-09-01 DIAGNOSIS — M25662 Stiffness of left knee, not elsewhere classified: Secondary | ICD-10-CM | POA: Diagnosis not present

## 2022-09-01 DIAGNOSIS — M25562 Pain in left knee: Secondary | ICD-10-CM | POA: Diagnosis not present

## 2022-09-01 DIAGNOSIS — G8929 Other chronic pain: Secondary | ICD-10-CM | POA: Diagnosis not present

## 2022-09-01 DIAGNOSIS — Z96652 Presence of left artificial knee joint: Secondary | ICD-10-CM | POA: Diagnosis not present

## 2022-09-07 DIAGNOSIS — M6281 Muscle weakness (generalized): Secondary | ICD-10-CM | POA: Diagnosis not present

## 2022-09-07 DIAGNOSIS — Z96652 Presence of left artificial knee joint: Secondary | ICD-10-CM | POA: Diagnosis not present

## 2022-09-07 DIAGNOSIS — M25562 Pain in left knee: Secondary | ICD-10-CM | POA: Diagnosis not present

## 2022-09-07 DIAGNOSIS — G8929 Other chronic pain: Secondary | ICD-10-CM | POA: Diagnosis not present

## 2022-09-07 DIAGNOSIS — M25662 Stiffness of left knee, not elsewhere classified: Secondary | ICD-10-CM | POA: Diagnosis not present

## 2022-09-12 DIAGNOSIS — M6281 Muscle weakness (generalized): Secondary | ICD-10-CM | POA: Diagnosis not present

## 2022-09-12 DIAGNOSIS — M25562 Pain in left knee: Secondary | ICD-10-CM | POA: Diagnosis not present

## 2022-09-12 DIAGNOSIS — Z96652 Presence of left artificial knee joint: Secondary | ICD-10-CM | POA: Diagnosis not present

## 2022-09-12 DIAGNOSIS — G8929 Other chronic pain: Secondary | ICD-10-CM | POA: Diagnosis not present

## 2022-09-12 DIAGNOSIS — M25662 Stiffness of left knee, not elsewhere classified: Secondary | ICD-10-CM | POA: Diagnosis not present

## 2022-09-15 DIAGNOSIS — M25562 Pain in left knee: Secondary | ICD-10-CM | POA: Diagnosis not present

## 2022-09-15 DIAGNOSIS — Z96652 Presence of left artificial knee joint: Secondary | ICD-10-CM | POA: Diagnosis not present

## 2022-09-15 DIAGNOSIS — M6281 Muscle weakness (generalized): Secondary | ICD-10-CM | POA: Diagnosis not present

## 2022-09-15 DIAGNOSIS — M25662 Stiffness of left knee, not elsewhere classified: Secondary | ICD-10-CM | POA: Diagnosis not present

## 2022-09-15 DIAGNOSIS — G8929 Other chronic pain: Secondary | ICD-10-CM | POA: Diagnosis not present

## 2022-09-17 ENCOUNTER — Other Ambulatory Visit: Payer: Self-pay | Admitting: Internal Medicine

## 2022-09-19 DIAGNOSIS — M25562 Pain in left knee: Secondary | ICD-10-CM | POA: Diagnosis not present

## 2022-09-19 DIAGNOSIS — G8929 Other chronic pain: Secondary | ICD-10-CM | POA: Diagnosis not present

## 2022-09-19 DIAGNOSIS — M25662 Stiffness of left knee, not elsewhere classified: Secondary | ICD-10-CM | POA: Diagnosis not present

## 2022-09-19 DIAGNOSIS — Z96652 Presence of left artificial knee joint: Secondary | ICD-10-CM | POA: Diagnosis not present

## 2022-09-19 DIAGNOSIS — M6281 Muscle weakness (generalized): Secondary | ICD-10-CM | POA: Diagnosis not present

## 2022-09-22 ENCOUNTER — Other Ambulatory Visit: Payer: Self-pay | Admitting: Internal Medicine

## 2022-09-22 DIAGNOSIS — Z1231 Encounter for screening mammogram for malignant neoplasm of breast: Secondary | ICD-10-CM

## 2022-09-27 DIAGNOSIS — M5416 Radiculopathy, lumbar region: Secondary | ICD-10-CM | POA: Diagnosis not present

## 2022-09-27 DIAGNOSIS — G8929 Other chronic pain: Secondary | ICD-10-CM | POA: Diagnosis not present

## 2022-09-27 DIAGNOSIS — M5442 Lumbago with sciatica, left side: Secondary | ICD-10-CM | POA: Diagnosis not present

## 2022-09-27 DIAGNOSIS — M47816 Spondylosis without myelopathy or radiculopathy, lumbar region: Secondary | ICD-10-CM | POA: Diagnosis not present

## 2022-09-27 DIAGNOSIS — M5441 Lumbago with sciatica, right side: Secondary | ICD-10-CM | POA: Diagnosis not present

## 2022-09-28 DIAGNOSIS — G8929 Other chronic pain: Secondary | ICD-10-CM | POA: Diagnosis not present

## 2022-09-28 DIAGNOSIS — Z96652 Presence of left artificial knee joint: Secondary | ICD-10-CM | POA: Diagnosis not present

## 2022-09-28 DIAGNOSIS — M25662 Stiffness of left knee, not elsewhere classified: Secondary | ICD-10-CM | POA: Diagnosis not present

## 2022-09-28 DIAGNOSIS — M25562 Pain in left knee: Secondary | ICD-10-CM | POA: Diagnosis not present

## 2022-09-28 DIAGNOSIS — M6281 Muscle weakness (generalized): Secondary | ICD-10-CM | POA: Diagnosis not present

## 2022-09-29 DIAGNOSIS — G8929 Other chronic pain: Secondary | ICD-10-CM | POA: Diagnosis not present

## 2022-09-29 DIAGNOSIS — M25562 Pain in left knee: Secondary | ICD-10-CM | POA: Diagnosis not present

## 2022-09-29 DIAGNOSIS — M6281 Muscle weakness (generalized): Secondary | ICD-10-CM | POA: Diagnosis not present

## 2022-09-29 DIAGNOSIS — M25662 Stiffness of left knee, not elsewhere classified: Secondary | ICD-10-CM | POA: Diagnosis not present

## 2022-09-29 DIAGNOSIS — Z96652 Presence of left artificial knee joint: Secondary | ICD-10-CM | POA: Diagnosis not present

## 2022-10-03 DIAGNOSIS — M25562 Pain in left knee: Secondary | ICD-10-CM | POA: Diagnosis not present

## 2022-10-03 DIAGNOSIS — M6281 Muscle weakness (generalized): Secondary | ICD-10-CM | POA: Diagnosis not present

## 2022-10-03 DIAGNOSIS — Z96652 Presence of left artificial knee joint: Secondary | ICD-10-CM | POA: Diagnosis not present

## 2022-10-03 DIAGNOSIS — M25662 Stiffness of left knee, not elsewhere classified: Secondary | ICD-10-CM | POA: Diagnosis not present

## 2022-10-03 DIAGNOSIS — G8929 Other chronic pain: Secondary | ICD-10-CM | POA: Diagnosis not present

## 2022-10-07 DIAGNOSIS — M47816 Spondylosis without myelopathy or radiculopathy, lumbar region: Secondary | ICD-10-CM | POA: Diagnosis not present

## 2022-10-11 ENCOUNTER — Other Ambulatory Visit (INDEPENDENT_AMBULATORY_CARE_PROVIDER_SITE_OTHER): Payer: PPO

## 2022-10-11 DIAGNOSIS — R944 Abnormal results of kidney function studies: Secondary | ICD-10-CM | POA: Diagnosis not present

## 2022-10-11 DIAGNOSIS — E7800 Pure hypercholesterolemia, unspecified: Secondary | ICD-10-CM

## 2022-10-11 DIAGNOSIS — D649 Anemia, unspecified: Secondary | ICD-10-CM | POA: Diagnosis not present

## 2022-10-11 DIAGNOSIS — E1065 Type 1 diabetes mellitus with hyperglycemia: Secondary | ICD-10-CM | POA: Diagnosis not present

## 2022-10-11 LAB — URINALYSIS, ROUTINE W REFLEX MICROSCOPIC
Bilirubin Urine: NEGATIVE
Hgb urine dipstick: NEGATIVE
Ketones, ur: NEGATIVE
Nitrite: POSITIVE — AB
Specific Gravity, Urine: 1.025 (ref 1.000–1.030)
Total Protein, Urine: NEGATIVE
Urine Glucose: NEGATIVE
Urobilinogen, UA: 0.2 (ref 0.0–1.0)
pH: 6 (ref 5.0–8.0)

## 2022-10-11 LAB — CBC WITH DIFFERENTIAL/PLATELET
Basophils Absolute: 0 10*3/uL (ref 0.0–0.1)
Basophils Relative: 0.7 % (ref 0.0–3.0)
Eosinophils Absolute: 0.2 10*3/uL (ref 0.0–0.7)
Eosinophils Relative: 2.7 % (ref 0.0–5.0)
HCT: 34.6 % — ABNORMAL LOW (ref 36.0–46.0)
Hemoglobin: 11.3 g/dL — ABNORMAL LOW (ref 12.0–15.0)
Lymphocytes Relative: 22.6 % (ref 12.0–46.0)
Lymphs Abs: 1.4 10*3/uL (ref 0.7–4.0)
MCHC: 32.6 g/dL (ref 30.0–36.0)
MCV: 88.3 fL (ref 78.0–100.0)
Monocytes Absolute: 0.7 10*3/uL (ref 0.1–1.0)
Monocytes Relative: 10.5 % (ref 3.0–12.0)
Neutro Abs: 4.1 10*3/uL (ref 1.4–7.7)
Neutrophils Relative %: 63.5 % (ref 43.0–77.0)
Platelets: 230 10*3/uL (ref 150.0–400.0)
RBC: 3.92 Mil/uL (ref 3.87–5.11)
RDW: 16.2 % — ABNORMAL HIGH (ref 11.5–15.5)
WBC: 6.4 10*3/uL (ref 4.0–10.5)

## 2022-10-11 LAB — BASIC METABOLIC PANEL
BUN: 21 mg/dL (ref 6–23)
CO2: 26 meq/L (ref 19–32)
Calcium: 9.6 mg/dL (ref 8.4–10.5)
Chloride: 106 meq/L (ref 96–112)
Creatinine, Ser: 0.75 mg/dL (ref 0.40–1.20)
GFR: 70.5 mL/min (ref >60.00)
Glucose, Bld: 160 mg/dL — ABNORMAL HIGH (ref 70–99)
Potassium: 3.6 meq/L (ref 3.5–5.1)
Sodium: 141 meq/L (ref 135–145)

## 2022-10-11 LAB — LIPID PANEL
Cholesterol: 119 mg/dL (ref 0–200)
HDL: 50.1 mg/dL (ref >39.00)
LDL Cholesterol: 54 mg/dL (ref 0–99)
NonHDL: 68.58
Total CHOL/HDL Ratio: 2
Triglycerides: 75 mg/dL (ref 0.0–149.0)
VLDL: 15 mg/dL (ref 0.0–40.0)

## 2022-10-11 LAB — HEPATIC FUNCTION PANEL
ALT: 14 U/L (ref 0–35)
AST: 12 U/L (ref 0–37)
Albumin: 3.9 g/dL (ref 3.5–5.2)
Alkaline Phosphatase: 102 U/L (ref 39–117)
Bilirubin, Direct: 0.1 mg/dL (ref 0.0–0.3)
Total Bilirubin: 0.5 mg/dL (ref 0.2–1.2)
Total Protein: 6.6 g/dL (ref 6.0–8.3)

## 2022-10-11 LAB — HEMOGLOBIN A1C: Hgb A1c MFr Bld: 7.2 % — ABNORMAL HIGH (ref 4.6–6.5)

## 2022-10-12 ENCOUNTER — Other Ambulatory Visit: Payer: Self-pay | Admitting: Internal Medicine

## 2022-10-12 ENCOUNTER — Ambulatory Visit (INDEPENDENT_AMBULATORY_CARE_PROVIDER_SITE_OTHER): Payer: PPO

## 2022-10-12 ENCOUNTER — Ambulatory Visit
Admission: RE | Admit: 2022-10-12 | Discharge: 2022-10-12 | Disposition: A | Discharge status: Home / Self Care | Payer: PPO | Source: Ambulatory Visit | Attending: Internal Medicine | Admitting: Internal Medicine

## 2022-10-12 DIAGNOSIS — D649 Anemia, unspecified: Secondary | ICD-10-CM | POA: Diagnosis not present

## 2022-10-12 DIAGNOSIS — Z1231 Encounter for screening mammogram for malignant neoplasm of breast: Secondary | ICD-10-CM | POA: Diagnosis not present

## 2022-10-12 LAB — VITAMIN B12: Vitamin B-12: 226 pg/mL (ref 211–911)

## 2022-10-12 LAB — IBC + FERRITIN
Ferritin: 39.3 ng/mL (ref 10.0–291.0)
Iron: 75 ug/dL (ref 42–145)
Saturation Ratios: 23.4 % (ref 20.0–50.0)
TIBC: 320.6 ug/dL (ref 250.0–450.0)
Transferrin: 229 mg/dL (ref 212.0–360.0)

## 2022-10-12 NOTE — Progress Notes (Signed)
Order placed for add on labs.   °

## 2022-10-13 ENCOUNTER — Ambulatory Visit: Payer: PPO | Admitting: Internal Medicine

## 2022-10-13 DIAGNOSIS — H353132 Nonexudative age-related macular degeneration, bilateral, intermediate dry stage: Secondary | ICD-10-CM | POA: Diagnosis not present

## 2022-10-13 DIAGNOSIS — E119 Type 2 diabetes mellitus without complications: Secondary | ICD-10-CM | POA: Diagnosis not present

## 2022-10-13 DIAGNOSIS — Z961 Presence of intraocular lens: Secondary | ICD-10-CM | POA: Diagnosis not present

## 2022-10-13 LAB — HM DIABETES EYE EXAM

## 2022-10-13 NOTE — Progress Notes (Signed)
Subjective:    Patient ID: Marissa Huff, female    DOB: Oct 09, 1931, 89 y.o.   MRN: [id number]  Patient here for  Chief Complaint  Patient presents with   Medical Management of Chronic Issues    HPI Here to follow up regarding hypercholesterolemia, diabetes and increased stress/anxiety. S/p left total knee arthroplasy 07/06/22. She is accompanied by her husband. History obtained from both of them. Recently evaluated for increased anxiety.  Specifically anxious with PT.  Recommended to start on buspar. Husband reports she hs not been taking.  Discussed starting.  She is in agreement. Has been going to PT for her left knee. Had a lot of appts this week, so put on hold this week, but plans to get back to therapy next week.  Planning for nerve block (Dr Mariah Milling) next week.  Breathing overall stable.  No chest pain reported.  Able to do ADLs.  No abdominal pain or bowel change reported.     Past Medical History:  Diagnosis Date   Allergy    Arthritis    Carotid artery disease (HCC)    Carpal tunnel syndrome, bilateral    Chronic kidney disease, stage 2, mildly decreased GFR    Diabetes mellitus without complication (HCC)    Diverticulosis    Gastric lymphoma (HCC)    Gastroparesis    GERD (gastroesophageal reflux disease)    History of kidney stones    Hyperlipidemia    Hypertension associated with diabetes (HCC)    Hypothyroidism    Malignant lymphoma (HCC)    gastric   PUD (peptic ulcer disease)    Skin cancer    Spinal stenosis    lumbar laminectomy   Type 1 diabetes mellitus (HCC)    Urinary incontinence    Past Surgical History:  Procedure Laterality Date   antrectomy and vagotomy  1994   CARPAL TUNNEL RELEASE Left 10/24/2008   CARPAL TUNNEL RELEASE Right 06/19/2009   CATARACT EXTRACTION, BILATERAL Bilateral 2014   COLONOSCOPY  05/24/2007   EYE SURGERY     bilateral cataracts   gastric lymphoma  1996   surgery   KNEE ARTHROPLASTY Left 07/06/2022   Procedure: COMPUTER  ASSISTED TOTAL KNEE ARTHROPLASTY;  Surgeon: Donato Heinz, MD;  Location: [facility] OR[facility];  Service: Orthopedics;  Laterality: Left;   KNEE ARTHROSCOPY Left 12/05/2008   KNEE ARTHROSCOPY Right 06/16/2010   LAPAROSCOPIC CHOLECYSTECTOMY  06/1999   LUMBAR LAMINECTOMY  1999   LUMBAR LAMINECTOMY WITH COFLEX 1 LEVEL N/A 09/15/2015   Procedure: Lumbar one-two Laminectomy with placement of coflex;  Surgeon: Barnett Abu, MD;  Location: [facility];  Service: Neurosurgery;  Laterality: N/A;   PARTIAL HYSTERECTOMY  02/1978   fibroid tumors, ovaries not removed   TONSILLECTOMY AND ADENOIDECTOMY  1982   TRIGGER FINGER RELEASE Right 06/19/2009   Family History  Problem Relation Age of Onset   Stroke Mother    Stroke Father    Hypertension Father    Diabetes Brother    Multiple sclerosis Brother    Breast cancer Daughter 60   Colon cancer Neg Hx    Social History   Socioeconomic History   Marital status: Married    Spouse name: Leonette Most   Number of children: 2   Years of education: Not on file   Highest education level: Not on file  Occupational History   Not on file  Tobacco Use   Smoking status: Never   Smokeless tobacco: Never  Vaping Use   Vaping status:  Never Used  Substance and Sexual Activity   Alcohol use: No    Alcohol/week: 0.0 standard drinks of alcohol   Drug use: No   Sexual activity: Not Currently    Birth control/protection: Surgical  Other Topics Concern   Not on file  Social History Narrative   Not on file   Social Determinants of Health   Financial Resource Strain: Low Risk  (01/28/2021)   Overall Financial Resource Strain (CARDIA)    Difficulty of Paying Living Expenses: Not hard at all  Food Insecurity: No Food Insecurity (07/06/2022)   Hunger Vital Sign    Worried About Running Out of Food in the Last Year: Never true    Ran Out of Food in the Last Year: Never true  Transportation Needs: No Transportation Needs (07/06/2022)   PRAPARE - Scientist, research (physical sciences) (Medical): No    Lack of Transportation (Non-Medical): No  Physical Activity: Unknown (01/19/2018)   Exercise Vital Sign    Days of Exercise per Week: 0 days    Minutes of Exercise per Session: Not on file  Stress: No Stress Concern Present (01/28/2021)   [employer] of Occupational Health - Occupational Stress Questionnaire    Feeling of Stress : Not at all  Social Connections: Socially Integrated (01/28/2021)   Social Connection and Isolation Panel [NHANES]    Frequency of Communication with Friends and Family: More than three times a week    Frequency of Social Gatherings with Friends and Family: More than three times a week    Attends Religious Services: More than 4 times per year    Active Member of [employer] or Organizations: Yes    Attends Banker Meetings: Not on file    Marital Status: Married     Review of Systems  Constitutional:  Negative for appetite change and unexpected weight change.  HENT:  Negative for congestion and sinus pressure.   Respiratory:  Negative for cough, chest tightness and shortness of breath.   Cardiovascular:  Negative for chest pain and palpitations.  Gastrointestinal:  Negative for abdominal pain, diarrhea, nausea and vomiting.  Genitourinary:  Negative for difficulty urinating and dysuria.  Musculoskeletal:  Positive for back pain. Negative for joint swelling and myalgias.  Skin:  Negative for color change and rash.  Neurological:  Negative for dizziness and headaches.  Psychiatric/Behavioral:  Negative for agitation and dysphoric mood.        Objective:     BP 128/70   Pulse 77   Temp 98 F (36.7 C)   Resp 16   Ht 5\' 3"  (1.6 m)   Wt 180 lb (81.6 kg)   SpO2 99%   BMI 31.89 kg/m  Wt Readings from Last 3 Encounters:  10/14/22 180 lb (81.6 kg)  08/16/22 179 lb (81.2 kg)  07/29/22 178 lb (80.7 kg)    Physical Exam Vitals reviewed.  Constitutional:      General: She is not in acute distress.     Appearance: Normal appearance.  HENT:     Head: Normocephalic and atraumatic.     Right Ear: External ear normal.     Left Ear: External ear normal.  Eyes:     General: No scleral icterus.       Right eye: No discharge.        Left eye: No discharge.     Conjunctiva/sclera: Conjunctivae normal.  Neck:     Thyroid: No thyromegaly.  Cardiovascular:     Rate  and Rhythm: Normal rate and regular rhythm.  Pulmonary:     Effort: No respiratory distress.     Breath sounds: Normal breath sounds. No wheezing.  Abdominal:     General: Bowel sounds are normal.     Palpations: Abdomen is soft.     Tenderness: There is no abdominal tenderness.  Musculoskeletal:        General: No swelling or tenderness.     Cervical back: Neck supple. No tenderness.  Lymphadenopathy:     Cervical: No cervical adenopathy.  Skin:    Findings: No erythema or rash.  Neurological:     Mental Status: She is alert.  Psychiatric:        Mood and Affect: Mood normal.        Behavior: Behavior normal.      Outpatient Encounter Medications as of 10/14/2022  Medication Sig   acetaminophen (TYLENOL) 325 MG tablet Take 1-2 tablets (325-650 mg total) by mouth every 6 (six) hours as needed for mild pain (pain score 1-3 or temp > 100.5).   albuterol (VENTOLIN HFA) 108 (90 Base) MCG/ACT inhaler TAKE 2 PUFFS BY MOUTH EVERY 6 HOURS AS NEEDED FOR WHEEZE OR SHORTNESS OF BREATH   amLODipine (NORVASC) 5 MG tablet TAKE 1 TABLET (5 MG TOTAL) BY MOUTH DAILY.   busPIRone (BUSPAR) 5 MG tablet TAKE 1 TABLET BY MOUTH TWICE A DAY   clobetasol (TEMOVATE) 0.05 % external solution Apply 1 Application topically 2 (two) times daily.   Continuous Blood Gluc Receiver (FREESTYLE LIBRE 14 DAY READER) DEVI Use 1 each as needed .  Use to test blood sugar   Continuous Blood Gluc Sensor (FREESTYLE LIBRE 14 DAY SENSOR) MISC Use 1 each every 14 (fourteen) days   dextromethorphan-guaiFENesin (MUCINEX DM) 30-600 MG 12hr tablet Take 1 tablet by mouth  daily as needed for cough.   diphenhydrAMINE (BENADRYL) 25 MG tablet Take 25 mg by mouth at bedtime as needed.   fexofenadine (ALLEGRA) 180 MG tablet Take 180 mg by mouth daily.   fluticasone (FLONASE) 50 MCG/ACT nasal spray SPRAY 2 SPRAYS INTO EACH NOSTRIL EVERY DAY (Patient taking differently: Place 1 spray into both nostrils at bedtime. SPRAY 2 SPRAYS INTO EACH NOSTRIL EVERY DAY)   glucagon (GLUCAGON EMERGENCY) 1 MG injection Inject 1 mg into the muscle once as needed.   insulin aspart (NOVOLOG FLEXPEN) 100 UNIT/ML FlexPen Inject 4-13 Units into the skin 3 (three) times daily with meals. Sliding scale   ketoconazole (NIZORAL) 2 % shampoo Apply 1 Application topically 3 (three) times a week.   LANTUS SOLOSTAR 100 UNIT/ML Solostar Pen Inject 20-26 Units into the skin at bedtime. Sliding scale   levothyroxine (SYNTHROID) 200 MCG tablet TAKE 1 TABLET (200 MCG TOTAL) BY MOUTH DAILY BEFORE BREAKFAST.   MULTIPLE VITAMINS-MINERALS PO Take 1 tablet by mouth daily.   Olopatadine HCl (PATADAY) 0.7 % SOLN Apply 1 drop to eye daily as needed.   ondansetron (ZOFRAN) 4 MG tablet Take 1 tablet (4 mg total) by mouth every 6 (six) hours as needed for nausea.   OVER THE COUNTER MEDICATION Take 2 capsules by mouth daily. Cerebra Brain Health   OVER THE COUNTER MEDICATION Take 2 capsules by mouth daily as needed. Gluco Switch Blood sugar support   pantoprazole (PROTONIX) 40 MG tablet TAKE 1 TABLET BY MOUTH TWICE A DAY   Polyethyl Glycol-Propyl Glycol (SYSTANE OP) Place 1 drop into both eyes at bedtime.   Polyethylene Glycol 3350 (MIRALAX PO) Take 1 Dose by mouth daily  as needed.   rosuvastatin (CRESTOR) 5 MG tablet TAKE 1 TABLET BY MOUTH THREE TIMES A WEEK. (Patient taking differently: Take 5 mg by mouth 3 (three) times a week. MONDAY, WEDNESDAY, FRIDAY)   senna-docusate (SENOKOT-S) 8.6-50 MG tablet Take 1 tablet by mouth 2 (two) times daily.   traMADol (ULTRAM) 50 MG tablet Take 1 tablet (50 mg total) by mouth  every 4 (four) hours as needed for moderate pain.   valsartan-hydrochlorothiazide (DIOVAN-HCT) 160-12.5 MG tablet TAKE 1 TABLET BY MOUTH EVERY DAY   Wheat Dextrin (BENEFIBER) POWD Take 1 Dose by mouth daily as needed.   [EXPIRED] cyanocobalamin (VITAMIN B12) injection 1,000 mcg    No facility-administered encounter medications on file as of 10/14/2022.     Lab Results  Component Value Date   WBC 6.4 10/11/2022   HGB 11.3 (L) 10/11/2022   HCT 34.6 (L) 10/11/2022   PLT 230.0 10/11/2022   GLUCOSE 160 (H) 10/11/2022   CHOL 119 10/11/2022   TRIG 75.0 10/11/2022   HDL 50.10 10/11/2022   LDLCALC 54 10/11/2022   ALT 14 10/11/2022   AST 12 10/11/2022   NA 141 10/11/2022   K 3.6 10/11/2022   CL 106 10/11/2022   CREATININE 0.75 10/11/2022   BUN 21 10/11/2022   CO2 26 10/11/2022   TSH 0.92 03/02/2022   HGBA1C 7.2 (H) 10/11/2022   MICROALBUR 2.3 (H) 07/16/2021    No results found.     Assessment & Plan:  Need for influenza vaccination -     Flu Vaccine Trivalent High Dose (Fluad)  B12 deficiency -     Cyanocobalamin  Type 1 diabetes mellitus with hyperglycemia (HCC) Assessment & Plan: Followed by endocrinology.  Currently on lantus and uses novolog SSI.  Outside sugars reviewed.  Time active 87%.  Target range 40%, high 37% and very high 22%.  Low 1%.  Reported occurred when had not eaten.  Discussed diet and exercise. Continue f/u with Dr Gershon Crane. Recent A1c improved -  Lab Results  Component Value Date   HGBA1C 7.2 (H) 10/11/2022      Spinal stenosis, unspecified spinal region Assessment & Plan: Saw Dr Mariah Milling recently.  Prescribed tramadol.  Takes prn. Continue f/u. Has planned f/u next week - nerve block.    Other specified hypothyroidism Assessment & Plan: On synthroid.  Follow tsh.    Primary hypertension Assessment & Plan: Currently on valsartan/hctz and amlodipine. Blood pressure as outlined.  Continue current medications.  Follow metabolic panel.      Hyperlipidemia due to type 1 diabetes mellitus [facility]) Assessment & Plan: Continue crestor.  Low cholesterol diet and exercise.  Follow lipid panel and liver function tests.  Followed by endocrinology.  Currently on lantus and uses novolog SSI.  Sugars as outlined. Discussed diet and exercise. Continue f/u with Dr Gershon Crane.    Hypercholesterolemia Assessment & Plan: Continue crestor.  Low cholesterol diet and exercise.  Follow lipid panel and liver function tests.   Lab Results  Component Value Date   CHOL 119 10/11/2022   HDL 50.10 10/11/2022   LDLCALC 54 10/11/2022   TRIG 75.0 10/11/2022   CHOLHDL 2 10/11/2022     Hydronephrosis with ureteropelvic junction (UPJ) obstruction Assessment & Plan: Saw Dr Apolinar Junes 02/2021 - secondary to UPJ obstruction being managed conservatively. Asymptomatic. Recommended renal ultrasound and f/u in one year.  Need to confirm f/u.     Gastroesophageal reflux disease, unspecified whether esophagitis present Assessment & Plan: Continue on protonix.     Gastric  lymphoma [facility]) Assessment & Plan: History of gastric lymphoma. miralax has helped bowels.  Was previously referred back to GI for evaluation and question of need for EGD given history.  Saw GI 07/08/21 - ordered UGI with barium swallow.  Pt had previously preferred EGD. Have discussed need for f/u with GI.  F/u phone call with GI reviewed.  Pt doing well.  Elected to follow.      Carotid artery disease, unspecified laterality (HCC) Assessment & Plan: Carotid duplex 02/2022 reveals some progression on the right side with common carotid artery stenosis that now appears to be 50% or greater and internal carotid artery stenosis that remains in the 1 to 39% range on the right.  The left internal carotid artery at the 1 to 39% range and stable from previous studies.  No role for intervention in an asymptomatic 89 year old.  Continue current medical regimen which includes Crestor and aspirin.  Recheck  in 1 year.    Anxiety Assessment & Plan: Increased stress and anxiety as outlined. Have discussed.  Have discussed importance of attending PT sessions and doing exercise at home. Previously on zoloft.  Did not feel helped.  Started buspar. Not taking. Discussed starting.  Follow.    CKD (chronic kidney disease) stage 2, GFR 60-89 ml/min Assessment & Plan: Avoid antiinflammatories.  Follow metabolic panel.    Gastroparesis Assessment & Plan: Has been followed by GI.       Dale [place], MD

## 2022-10-14 ENCOUNTER — Encounter: Payer: Self-pay | Admitting: Internal Medicine

## 2022-10-14 ENCOUNTER — Ambulatory Visit: Payer: PPO | Admitting: Internal Medicine

## 2022-10-14 VITALS — BP 128/70 | HR 77 | Temp 98.0°F | Resp 16 | Ht 63.0 in | Wt 180.0 lb

## 2022-10-14 DIAGNOSIS — M4800 Spinal stenosis, site unspecified: Secondary | ICD-10-CM

## 2022-10-14 DIAGNOSIS — E1065 Type 1 diabetes mellitus with hyperglycemia: Secondary | ICD-10-CM

## 2022-10-14 DIAGNOSIS — K3184 Gastroparesis: Secondary | ICD-10-CM

## 2022-10-14 DIAGNOSIS — E538 Deficiency of other specified B group vitamins: Secondary | ICD-10-CM

## 2022-10-14 DIAGNOSIS — E038 Other specified hypothyroidism: Secondary | ICD-10-CM

## 2022-10-14 DIAGNOSIS — Z23 Encounter for immunization: Secondary | ICD-10-CM

## 2022-10-14 DIAGNOSIS — I779 Disorder of arteries and arterioles, unspecified: Secondary | ICD-10-CM | POA: Diagnosis not present

## 2022-10-14 DIAGNOSIS — E1069 Type 1 diabetes mellitus with other specified complication: Secondary | ICD-10-CM

## 2022-10-14 DIAGNOSIS — I10 Essential (primary) hypertension: Secondary | ICD-10-CM | POA: Diagnosis not present

## 2022-10-14 DIAGNOSIS — N182 Chronic kidney disease, stage 2 (mild): Secondary | ICD-10-CM

## 2022-10-14 DIAGNOSIS — F419 Anxiety disorder, unspecified: Secondary | ICD-10-CM

## 2022-10-14 DIAGNOSIS — C8599 Non-Hodgkin lymphoma, unspecified, extranodal and solid organ sites: Secondary | ICD-10-CM

## 2022-10-14 DIAGNOSIS — E785 Hyperlipidemia, unspecified: Secondary | ICD-10-CM

## 2022-10-14 DIAGNOSIS — K219 Gastro-esophageal reflux disease without esophagitis: Secondary | ICD-10-CM | POA: Diagnosis not present

## 2022-10-14 DIAGNOSIS — Q6211 Congenital occlusion of ureteropelvic junction: Secondary | ICD-10-CM

## 2022-10-14 DIAGNOSIS — E7800 Pure hypercholesterolemia, unspecified: Secondary | ICD-10-CM

## 2022-10-14 MED ORDER — CYANOCOBALAMIN 1000 MCG/ML IJ SOLN
1000.0000 ug | Freq: Once | INTRAMUSCULAR | Status: AC
Start: 2022-10-14 — End: 2022-10-14
  Administered 2022-10-14: 1000 ug via INTRAMUSCULAR

## 2022-10-16 ENCOUNTER — Encounter: Payer: Self-pay | Admitting: Internal Medicine

## 2022-10-16 NOTE — Assessment & Plan Note (Signed)
Increased stress and anxiety as outlined. Have discussed.  Have discussed importance of attending PT sessions and doing exercise at home. Previously on zoloft.  Did not feel helped.  Started buspar. Not taking. Discussed starting.  Follow.

## 2022-10-16 NOTE — Assessment & Plan Note (Signed)
Avoid antiinflammatories.  Follow metabolic panel.

## 2022-10-16 NOTE — Assessment & Plan Note (Signed)
Has been followed by GI.

## 2022-10-16 NOTE — Assessment & Plan Note (Signed)
Continue crestor.  Low cholesterol diet and exercise.  Follow lipid panel and liver function tests.   Lab Results  Component Value Date   CHOL 119 10/11/2022   HDL 50.10 10/11/2022   LDLCALC 54 10/11/2022   TRIG 75.0 10/11/2022   CHOLHDL 2 10/11/2022

## 2022-10-16 NOTE — Assessment & Plan Note (Signed)
Saw Dr Apolinar Junes 02/2021 - secondary to UPJ obstruction being managed conservatively. Asymptomatic. Recommended renal ultrasound and f/u in one year.  Need to confirm f/u.

## 2022-10-16 NOTE — Assessment & Plan Note (Signed)
Continue crestor.  Low cholesterol diet and exercise.  Follow lipid panel and liver function tests.  Followed by endocrinology.  Currently on lantus and uses novolog SSI.  Sugars as outlined. Discussed diet and exercise. Continue f/u with Dr Gershon Crane.

## 2022-10-16 NOTE — Assessment & Plan Note (Signed)
On synthroid.  Follow tsh.

## 2022-10-16 NOTE — Assessment & Plan Note (Signed)
Continue on protonix.

## 2022-10-16 NOTE — Assessment & Plan Note (Signed)
Currently on valsartan/hctz and amlodipine. Blood pressure as outlined.  Continue current medications.  Follow metabolic panel.

## 2022-10-16 NOTE — Assessment & Plan Note (Signed)
Carotid duplex 02/2022 reveals some progression on the right side with common carotid artery stenosis that now appears to be 50% or greater and internal carotid artery stenosis that remains in the 1 to 39% range on the right.  The left internal carotid artery at the 1 to 39% range and stable from previous studies.  No role for intervention in an asymptomatic 89 year old.  Continue current medical regimen which includes Crestor and aspirin.  Recheck in 1 year.

## 2022-10-16 NOTE — Assessment & Plan Note (Signed)
Followed by endocrinology.  Currently on lantus and uses novolog SSI.  Outside sugars reviewed.  Time active 87%.  Target range 40%, high 37% and very high 22%.  Low 1%.  Reported occurred when had not eaten.  Discussed diet and exercise. Continue f/u with Dr Gershon Crane. Recent A1c improved -  Lab Results  Component Value Date   HGBA1C 7.2 (H) 10/11/2022

## 2022-10-16 NOTE — Assessment & Plan Note (Signed)
Saw Dr Mariah Milling recently.  Prescribed tramadol.  Takes prn. Continue f/u. Has planned f/u next week - nerve block.

## 2022-10-16 NOTE — Assessment & Plan Note (Signed)
History of gastric lymphoma. miralax has helped bowels.  Was previously referred back to GI for evaluation and question of need for EGD given history.  Saw GI 07/08/21 - ordered UGI with barium swallow.  Pt had previously preferred EGD. Have discussed need for f/u with GI.  F/u phone call with GI reviewed.  Pt doing well.  Elected to follow.

## 2022-10-24 ENCOUNTER — Ambulatory Visit: Payer: PPO

## 2022-10-24 NOTE — Progress Notes (Deleted)
Pt presented for their vitamin B12 injection. Pt was identified through two identifiers. Pt tolerated shot well in their left or right deltoid.  

## 2022-10-25 DIAGNOSIS — M5416 Radiculopathy, lumbar region: Secondary | ICD-10-CM | POA: Diagnosis not present

## 2022-10-25 DIAGNOSIS — G8929 Other chronic pain: Secondary | ICD-10-CM | POA: Diagnosis not present

## 2022-10-25 DIAGNOSIS — M5441 Lumbago with sciatica, right side: Secondary | ICD-10-CM | POA: Diagnosis not present

## 2022-10-25 DIAGNOSIS — M5442 Lumbago with sciatica, left side: Secondary | ICD-10-CM | POA: Diagnosis not present

## 2022-10-25 DIAGNOSIS — M47816 Spondylosis without myelopathy or radiculopathy, lumbar region: Secondary | ICD-10-CM | POA: Diagnosis not present

## 2022-10-31 ENCOUNTER — Ambulatory Visit (INDEPENDENT_AMBULATORY_CARE_PROVIDER_SITE_OTHER): Payer: PPO

## 2022-10-31 DIAGNOSIS — E538 Deficiency of other specified B group vitamins: Secondary | ICD-10-CM | POA: Diagnosis not present

## 2022-10-31 MED ORDER — CYANOCOBALAMIN 1000 MCG/ML IJ SOLN
1000.0000 ug | Freq: Once | INTRAMUSCULAR | Status: AC
Start: 2022-10-31 — End: 2022-10-31
  Administered 2022-10-31: 1000 ug via INTRAMUSCULAR

## 2022-10-31 NOTE — Progress Notes (Signed)
Pt presented for their vitamin B12 injection. Pt was identified through two identifiers. Pt tolerated shot well in their left  deltoid.  

## 2022-11-07 ENCOUNTER — Ambulatory Visit (INDEPENDENT_AMBULATORY_CARE_PROVIDER_SITE_OTHER): Payer: PPO

## 2022-11-07 DIAGNOSIS — E538 Deficiency of other specified B group vitamins: Secondary | ICD-10-CM | POA: Diagnosis not present

## 2022-11-07 MED ORDER — CYANOCOBALAMIN 1000 MCG/ML IJ SOLN
1000.0000 ug | Freq: Once | INTRAMUSCULAR | Status: AC
  Administered 2022-11-07: 1000 ug via INTRAMUSCULAR

## 2022-11-07 NOTE — Progress Notes (Signed)
Pt presented for their vitamin B12 injection. Pt was identified through two identifiers. Pt tolerated shot well in their left deltoid.   Pt wanted know when she should come back she mentioned that sheonly had 3 because one of her appts got cancelled, trish who met [place] in the hallway stated she would checka ndgive her a call to get her scheduled for either another weekly injection or for a monthly.

## 2022-11-08 DIAGNOSIS — M47816 Spondylosis without myelopathy or radiculopathy, lumbar region: Secondary | ICD-10-CM | POA: Diagnosis not present

## 2022-11-14 ENCOUNTER — Ambulatory Visit (INDEPENDENT_AMBULATORY_CARE_PROVIDER_SITE_OTHER): Payer: PPO

## 2022-11-14 DIAGNOSIS — E538 Deficiency of other specified B group vitamins: Secondary | ICD-10-CM | POA: Diagnosis not present

## 2022-11-14 MED ORDER — CYANOCOBALAMIN 1000 MCG/ML IJ SOLN
1000.0000 ug | Freq: Once | INTRAMUSCULAR | Status: AC
  Administered 2022-11-14: 1000 ug via INTRAMUSCULAR

## 2022-11-14 NOTE — Progress Notes (Signed)
Pt presented for their vitamin B12 injection. Pt was identified through two identifiers. Pt tolerated shot well in their left  deltoid.  

## 2022-11-23 DIAGNOSIS — L218 Other seborrheic dermatitis: Secondary | ICD-10-CM | POA: Diagnosis not present

## 2022-12-05 ENCOUNTER — Ambulatory Visit: Payer: PPO

## 2022-12-05 ENCOUNTER — Ambulatory Visit (INDEPENDENT_AMBULATORY_CARE_PROVIDER_SITE_OTHER): Payer: PPO | Admitting: *Deleted

## 2022-12-05 VITALS — Ht 63.0 in | Wt 179.0 lb

## 2022-12-05 DIAGNOSIS — Z0000 Encounter for general adult medical examination without abnormal findings: Secondary | ICD-10-CM

## 2022-12-05 NOTE — Progress Notes (Signed)
Subjective:   Marissa Huff is a 89 y.o. female who presents for Medicare Annual (Subsequent) preventive examination.  Visit Complete: Virtual I connected with  [name] on 12/05/22 by a audio enabled telemedicine application and verified that I am speaking with the correct person using two identifiers.  Patient Location: Home  Provider Location: Office/Clinic  I discussed the limitations of evaluation and management by telemedicine. The patient expressed understanding and agreed to proceed.  Vital Signs: Because this visit was a virtual/telehealth visit, some criteria may be missing or patient reported. Any vitals not documented were not able to be obtained and vitals that have been documented are patient reported.   Cardiac Risk Factors include: advanced age (>51men, >27 women);diabetes mellitus;dyslipidemia;hypertension;obesity (BMI >30kg/m2)     Objective:    Today's Vitals   12/05/22 1415  Weight: 179 lb (81.2 kg)  Height: 5\' 3"  (1.6 m)   Body mass index is 31.71 kg/m.     12/05/2022    2:34 PM 07/06/2022    6:00 PM 06/28/2022   11:39 AM 01/28/2021   10:48 AM 03/19/2020    4:28 PM 01/28/2020   10:50 AM 12/20/2019   12:40 PM  Advanced Directives  Does Patient Have a Medical Advance Directive? No No No No No No No  Would patient like information on creating a medical advance directive? No - Patient declined No - Patient declined  No - Patient declined  No - Patient declined     Current Medications (verified) Outpatient Encounter Medications as of 12/05/2022  Medication Sig   acetaminophen (TYLENOL) 325 MG tablet Take 1-2 tablets (325-650 mg total) by mouth every 6 (six) hours as needed for mild pain (pain score 1-3 or temp > 100.5).   albuterol (VENTOLIN HFA) 108 (90 Base) MCG/ACT inhaler TAKE 2 PUFFS BY MOUTH EVERY 6 HOURS AS NEEDED FOR WHEEZE OR SHORTNESS OF BREATH   amLODipine (NORVASC) 5 MG tablet TAKE 1 TABLET (5 MG TOTAL) BY MOUTH DAILY.   busPIRone  (BUSPAR) 5 MG tablet TAKE 1 TABLET BY MOUTH TWICE A DAY   clobetasol (TEMOVATE) 0.05 % external solution Apply 1 Application topically 2 (two) times daily.   Continuous Blood Gluc Receiver (FREESTYLE LIBRE 14 DAY READER) DEVI Use 1 each as needed .  Use to test blood sugar   Continuous Blood Gluc Sensor (FREESTYLE LIBRE 14 DAY SENSOR) MISC Use 1 each every 14 (fourteen) days   dextromethorphan-guaiFENesin (MUCINEX DM) 30-600 MG 12hr tablet Take 1 tablet by mouth daily as needed for cough.   fexofenadine (ALLEGRA) 180 MG tablet Take 180 mg by mouth daily.   fluticasone (FLONASE) 50 MCG/ACT nasal spray SPRAY 2 SPRAYS INTO EACH NOSTRIL EVERY DAY (Patient taking differently: Place 1 spray into both nostrils at bedtime. SPRAY 2 SPRAYS INTO EACH NOSTRIL EVERY DAY)   glucagon (GLUCAGON EMERGENCY) 1 MG injection Inject 1 mg into the muscle once as needed.   insulin aspart (NOVOLOG FLEXPEN) 100 UNIT/ML FlexPen Inject 4-13 Units into the skin 3 (three) times daily with meals. Sliding scale   LANTUS SOLOSTAR 100 UNIT/ML Solostar Pen Inject 20-26 Units into the skin at bedtime. Sliding scale   levothyroxine (SYNTHROID) 200 MCG tablet TAKE 1 TABLET (200 MCG TOTAL) BY MOUTH DAILY BEFORE BREAKFAST.   MULTIPLE VITAMINS-MINERALS PO Take 1 tablet by mouth daily.   Olopatadine HCl (PATADAY) 0.7 % SOLN Apply 1 drop to eye daily as needed.   OVER THE COUNTER MEDICATION Take 2 capsules by mouth daily. Cerebra  Brain Health   OVER THE COUNTER MEDICATION Take 2 capsules by mouth daily as needed. Gluco Switch Blood sugar support   pantoprazole (PROTONIX) 40 MG tablet TAKE 1 TABLET BY MOUTH TWICE A DAY   Polyethyl Glycol-Propyl Glycol (SYSTANE OP) Place 1 drop into both eyes at bedtime.   Polyethylene Glycol 3350 (MIRALAX PO) Take 1 Dose by mouth daily as needed.   rosuvastatin (CRESTOR) 5 MG tablet TAKE 1 TABLET BY MOUTH THREE TIMES A WEEK. (Patient taking differently: Take 5 mg by mouth 3 (three) times a week. MONDAY,  WEDNESDAY, FRIDAY)   valsartan-hydrochlorothiazide (DIOVAN-HCT) 160-12.5 MG tablet TAKE 1 TABLET BY MOUTH EVERY DAY   Wheat Dextrin (BENEFIBER) POWD Take 1 Dose by mouth daily as needed.   diphenhydrAMINE (BENADRYL) 25 MG tablet Take 25 mg by mouth at bedtime as needed. (Patient not taking: Reported on 12/05/2022)   ketoconazole (NIZORAL) 2 % shampoo Apply 1 Application topically 3 (three) times a week. (Patient not taking: Reported on 12/05/2022)   ondansetron (ZOFRAN) 4 MG tablet Take 1 tablet (4 mg total) by mouth every 6 (six) hours as needed for nausea. (Patient not taking: Reported on 12/05/2022)   senna-docusate (SENOKOT-S) 8.6-50 MG tablet Take 1 tablet by mouth 2 (two) times daily. (Patient not taking: Reported on 12/05/2022)   traMADol (ULTRAM) 50 MG tablet Take 1 tablet (50 mg total) by mouth every 4 (four) hours as needed for moderate pain. (Patient not taking: Reported on 12/05/2022)   No facility-administered encounter medications on file as of 12/05/2022.    Allergies (verified) Penicillins   History: Past Medical History:  Diagnosis Date   Allergy    Arthritis    Carotid artery disease (HCC)    Carpal tunnel syndrome, bilateral    Chronic kidney disease, stage 2, mildly decreased GFR    Diabetes mellitus without complication (HCC)    Diverticulosis    Gastric lymphoma (HCC)    Gastroparesis    GERD (gastroesophageal reflux disease)    History of kidney stones    Hyperlipidemia    Hypertension associated with diabetes (HCC)    Hypothyroidism    Malignant lymphoma (HCC)    gastric   PUD (peptic ulcer disease)    Skin cancer    Spinal stenosis    lumbar laminectomy   Type 1 diabetes mellitus (HCC)    Urinary incontinence    Past Surgical History:  Procedure Laterality Date   antrectomy and vagotomy  1994   CARPAL TUNNEL RELEASE Left 10/24/2008   CARPAL TUNNEL RELEASE Right 06/19/2009   CATARACT EXTRACTION, BILATERAL Bilateral 2014   COLONOSCOPY  05/24/2007   EYE  SURGERY     bilateral cataracts   gastric lymphoma  1996   surgery   KNEE ARTHROPLASTY Left 07/06/2022   Procedure: COMPUTER ASSISTED TOTAL KNEE ARTHROPLASTY;  Surgeon: Donato Heinz, MD;  Location: [facility] ORS;  Service: Orthopedics;  Laterality: Left;   KNEE ARTHROSCOPY Left 12/05/2008   KNEE ARTHROSCOPY Right 06/16/2010   LAPAROSCOPIC CHOLECYSTECTOMY  06/1999   LUMBAR LAMINECTOMY  1999   LUMBAR LAMINECTOMY WITH COFLEX 1 LEVEL N/A 09/15/2015   Procedure: Lumbar one-two Laminectomy with placement of coflex;  Surgeon: Barnett Abu, MD;  Location: [facility];  Service: Neurosurgery;  Laterality: N/A;   PARTIAL HYSTERECTOMY  02/1978   fibroid tumors, ovaries not removed   TONSILLECTOMY AND ADENOIDECTOMY  1982   TRIGGER FINGER RELEASE Right 06/19/2009   Family History  Problem Relation Age of Onset   Stroke Mother  Stroke Father    Hypertension Father    Diabetes Brother    Multiple sclerosis Brother    Breast cancer Daughter 2   Colon cancer Neg Hx    Social History   Socioeconomic History   Marital status: Married    Spouse name: Charles   Number of children: 2   Years of education: Not on file   Highest education level: Not on file  Occupational History   Not on file  Tobacco Use   Smoking status: Never   Smokeless tobacco: Never  Vaping Use   Vaping status: Never Used  Substance and Sexual Activity   Alcohol use: No    Alcohol/week: 0.0 standard drinks of alcohol   Drug use: No   Sexual activity: Not Currently    Birth control/protection: Surgical  Other Topics Concern   Not on file  Social History Narrative   married   Social Determinants of Health   Financial Resource Strain: Low Risk  (12/05/2022)   Overall Financial Resource Strain (CARDIA)    Difficulty of Paying Living Expenses: Not hard at all  Food Insecurity: No Food Insecurity (12/05/2022)   Hunger Vital Sign    Worried About Running Out of Food in the Last Year: Never true    Ran Out of Food in  the Last Year: Never true  Transportation Needs: No Transportation Needs (12/05/2022)   PRAPARE - Administrator, Civil Service (Medical): No    Lack of Transportation (Non-Medical): No  Physical Activity: Inactive (12/05/2022)   Exercise Vital Sign    Days of Exercise per Week: 0 days    Minutes of Exercise per Session: 0 min  Stress: Stress Concern Present (12/05/2022)   [employer] of Occupational Health - Occupational Stress Questionnaire    Feeling of Stress : To some extent  Social Connections: Moderately Integrated (12/05/2022)   Social Connection and Isolation Panel [NHANES]    Frequency of Communication with Friends and Family: More than three times a week    Frequency of Social Gatherings with Friends and Family: More than three times a week    Attends Religious Services: More than 4 times per year    Active Member of [employer] or Organizations: No    Attends Engineer, structural: Never    Marital Status: Married    Tobacco Counseling Counseling given: Not Answered   Clinical Intake:  Pre-visit preparation completed: Yes  Pain : No/denies pain     BMI - recorded: 31.71 Nutritional Status: BMI > 30  Obese Nutritional Risks: None Diabetes: Yes CBG done?: Yes (FBS 144) CBG resulted in Enter/ Edit results?: No Did pt. bring in CBG monitor from home?: No  How often do you need to have someone help you when you read instructions, pamphlets, or other written materials from your doctor or pharmacy?: 1 - Never  Interpreter Needed?: No  Information entered by :: R. Hendryx Ricke LPN   Activities of Daily Living    12/05/2022    2:17 PM 07/06/2022    6:00 PM  In your present state of health, do you have any difficulty performing the following activities:  Hearing? 1 1  Comment wears aids   Vision? 0 0  Comment glasses   Difficulty concentrating or making decisions? 1 0  Walking or climbing stairs? 1 1  Dressing or bathing? 0 0  Doing errands,  shopping? 0 1  Preparing Food and eating ? N   Using the Toilet? N  In the past six months, have you accidently leaked urine? Y   Comment wears pads   Do you have problems with loss of bowel control? Y   Managing your Medications? N   Managing your Finances? N   Housekeeping or managing your Housekeeping? Y   Comment daughters help with     Patient Care Team: Dale [place], MD as PCP - General (Internal Medicine)  Indicate any recent Medical Services you may have received from other than [facility] providers in the past year (date may be approximate).     Assessment:   This is a routine wellness examination for [place].  Hearing/Vision screen Hearing Screening - Comments:: Wears aids Vision Screening - Comments:: glasses   Goals Addressed             This Visit's Progress    Patient Stated       Wants to get more active       Depression Screen    12/05/2022    2:28 PM 08/16/2022    2:24 PM 06/30/2022    4:17 PM 05/16/2022   11:56 AM 03/02/2022   11:25 AM 10/28/2021   10:30 AM 07/21/2021   12:47 PM  PHQ 2/9 Scores  PHQ - 2 Score 0 1 0 0 2 1 1   PHQ- 9 Score 4    6      Fall Risk    12/05/2022    2:21 PM 08/16/2022    2:24 PM 06/30/2022    4:17 PM 05/16/2022   11:56 AM 03/02/2022   11:25 AM  Fall Risk   Falls in the past year? 1 0 1 1 0  Number falls in past yr: 1 0 0 0 0  Injury with Fall? 0 0 0 0 0  Risk for fall due to : History of fall(s);Impaired balance/gait No Fall Risks History of fall(s) History of fall(s) No Fall Risks  Follow up Falls evaluation completed;Falls prevention discussed  Falls evaluation completed Falls evaluation completed Falls evaluation completed    MEDICARE RISK AT HOME: Medicare Risk at Home Any stairs in or around the home?: Yes If so, are there any without handrails?: No Home free of loose throw rugs in walkways, pet beds, electrical cords, etc?: Yes Adequate lighting in your home to reduce risk of falls?: Yes Life alert?: No Use  of a cane, walker or w/c?: No Grab bars in the bathroom?: Yes Shower chair or bench in shower?: Yes Elevated toilet seat or a handicapped toilet?: Yes      Cognitive Function:    01/18/2017    2:02 PM 12/09/2015    1:36 PM 12/09/2014    1:37 PM  MMSE - Mini Mental State Exam  Orientation to time 5 5 5   Orientation to Place 5 5 5   Registration 3 3 3   Attention/ Calculation 4 5 5   Recall 3 3 3   Language- name 2 objects 2 2 2   Language- repeat 1 1 1   Language- follow 3 step command 3 3 3   Language- read & follow direction 1 1 1   Write a sentence 1 1 1   Copy design 1 1 1   Total score 29 30 30         12/05/2022    2:34 PM 01/28/2021   10:59 AM 01/21/2019   10:56 AM 01/19/2018    1:12 PM  6CIT Screen  What Year? 0 points 0 points 0 points 0 points  What month? 0 points 0 points 0 points 0 points  What time? 0 points 0 points 0 points 0 points  Count back from 20 0 points 0 points 0 points 0 points  Months in reverse 0 points 0 points 0 points 0 points  Repeat phrase 4 points 0 points  0 points  Total Score 4 points 0 points  0 points    Immunizations Immunization History  Administered Date(s) Administered   Fluad Quad(high Dose 65+) 09/25/2019, 10/07/2020, 10/28/2021   Fluad Trivalent(High Dose 65+) 10/14/2022   Influenza Split 12/12/2011, 10/01/2012   Influenza, High Dose Seasonal PF 10/02/2015, 09/08/2016, 09/19/2017, 09/26/2018   Influenza,inj,Quad PF,6+ Mos 09/03/2013, 10/27/2014   PFIZER(Purple Top)SARS-COV-2 Vaccination 01/29/2019, 02/19/2019, 10/24/2019   Pneumococcal Conjugate-13 11/11/2014   Pneumococcal Polysaccharide-23 12/09/2015   Zoster Recombinant(Shingrix) 04/08/2020, 07/21/2020    TDAP status: Due, Education has been provided regarding the importance of this vaccine. Advised may receive this vaccine at local pharmacy or Health Dept. Aware to provide a copy of the vaccination record if obtained from local pharmacy or Health Dept. Verbalized acceptance and  understanding.  Flu Vaccine status: Up to date  Pneumococcal vaccine status: Up to date  Covid-19 vaccine status: Information provided on how to obtain vaccines.   Qualifies for Shingles Vaccine? Yes   Zostavax completed No   Shingrix Completed?: Yes  Screening Tests Health Maintenance  Topic Date Due   DTaP/Tdap/Td (1 - Tdap) Never done   Medicare Annual Wellness (AWV)  01/28/2022   COVID-19 Vaccine (4 - 2023-24 season) 09/04/2022   HEMOGLOBIN A1C  04/11/2023   FOOT EXAM  08/16/2023   MAMMOGRAM  10/12/2023   OPHTHALMOLOGY EXAM  10/13/2023   Pneumonia Vaccine 8+ Years old  Completed   INFLUENZA VACCINE  Completed   DEXA SCAN  Completed   Zoster Vaccines- Shingrix  Completed   HPV VACCINES  Aged Out    Health Maintenance  Health Maintenance Due  Topic Date Due   DTaP/Tdap/Td (1 - Tdap) Never done   Medicare Annual Wellness (AWV)  01/28/2022   COVID-19 Vaccine (4 - 2023-24 season) 09/04/2022    Colorectal cancer screening: No longer required.   Mammogram status: Completed 10/2022. Repeat every year  Bone Density status: Completed 03/2021. Results reflect: Bone density results: OSTEOPOROSIS. Repeat every 2 years.  Lung Cancer Screening: (Low Dose CT Chest recommended if Age 74-80 years, 20 pack-year currently smoking OR have quit w/in 15years.) does not qualify.    Additional Screening:  Hepatitis C Screening: does not qualify; Completed NA Age  Vision Screening: Recommended annual ophthalmology exams for early detection of glaucoma and other disorders of the eye. Is the patient up to date with their annual eye exam?  Yes  Who is the provider or what is the name of the office in which the patient attends annual eye exams : [facility] If pt is not established with a provider, would they like to be referred to a provider to establish care? No .   Dental Screening: Recommended annual dental exams for proper oral hygiene  Diabetic Foot Exam: Diabetic Foot Exam:  Completed 08/2022  Community Resource Referral / Chronic Care Management: CRR required this visit?  No   CCM required this visit?  No     Plan:     I have personally reviewed and noted the following in the patient's chart:   Medical and social history Use of alcohol, tobacco or illicit drugs  Current medications and supplements including opioid prescriptions. Patient is not currently taking opioid prescriptions. Functional ability and status Nutritional status  Physical activity Advanced directives List of other physicians Hospitalizations, surgeries, and ER visits in previous 12 months Vitals Screenings to include cognitive, depression, and falls Referrals and appointments  In addition, I have reviewed and discussed with patient certain preventive protocols, quality metrics, and best practice recommendations. A written personalized care plan for preventive services as well as general preventive health recommendations were provided to patient.     Sydell Axon, LPN   [id number]   After Visit Summary: (MyChart) Due to this being a telephonic visit, the after visit summary with patients personalized plan was offered to patient via MyChart   Nurse Notes: None

## 2022-12-05 NOTE — Patient Instructions (Signed)
Marissa Huff , Thank you for taking time to come for your Medicare Wellness Visit. I appreciate your ongoing commitment to your health goals. Please review the following plan we discussed and let me know if I can assist you in the future.   Referrals/Orders/Follow-Ups/Clinician Recommendations: Remember to update your Tetanus vaccine (TDAP)  This is a list of the screening recommended for you and due dates:  Health Maintenance  Topic Date Due   DTaP/Tdap/Td vaccine (1 - Tdap) Never done   COVID-19 Vaccine (4 - 2023-24 season) 09/04/2022   Hemoglobin A1C  04/11/2023   Complete foot exam   08/16/2023   Mammogram  10/12/2023   Eye exam for diabetics  10/13/2023   Medicare Annual Wellness Visit  12/05/2023   Pneumonia Vaccine  Completed   Flu Shot  Completed   DEXA scan (bone density measurement)  Completed   Zoster (Shingles) Vaccine  Completed   HPV Vaccine  Aged Out    Advanced directives: (Declined) Advance directive discussed with you today. Even though you declined this today, please call our office should you change your mind, and we can give you the proper paperwork for you to fill out.  Next Medicare Annual Wellness Visit scheduled for next year: Yes 12/06/2023 @ 2:20

## 2022-12-12 DIAGNOSIS — R809 Proteinuria, unspecified: Secondary | ICD-10-CM | POA: Diagnosis not present

## 2022-12-12 DIAGNOSIS — E785 Hyperlipidemia, unspecified: Secondary | ICD-10-CM | POA: Diagnosis not present

## 2022-12-12 DIAGNOSIS — E1159 Type 2 diabetes mellitus with other circulatory complications: Secondary | ICD-10-CM | POA: Diagnosis not present

## 2022-12-12 DIAGNOSIS — M5416 Radiculopathy, lumbar region: Secondary | ICD-10-CM | POA: Diagnosis not present

## 2022-12-12 DIAGNOSIS — I152 Hypertension secondary to endocrine disorders: Secondary | ICD-10-CM | POA: Diagnosis not present

## 2022-12-12 DIAGNOSIS — M47816 Spondylosis without myelopathy or radiculopathy, lumbar region: Secondary | ICD-10-CM | POA: Diagnosis not present

## 2022-12-12 DIAGNOSIS — E039 Hypothyroidism, unspecified: Secondary | ICD-10-CM | POA: Diagnosis not present

## 2022-12-12 DIAGNOSIS — M5442 Lumbago with sciatica, left side: Secondary | ICD-10-CM | POA: Diagnosis not present

## 2022-12-12 DIAGNOSIS — M5441 Lumbago with sciatica, right side: Secondary | ICD-10-CM | POA: Diagnosis not present

## 2022-12-12 DIAGNOSIS — G8929 Other chronic pain: Secondary | ICD-10-CM | POA: Diagnosis not present

## 2022-12-12 DIAGNOSIS — E1069 Type 1 diabetes mellitus with other specified complication: Secondary | ICD-10-CM | POA: Diagnosis not present

## 2022-12-12 DIAGNOSIS — E1029 Type 1 diabetes mellitus with other diabetic kidney complication: Secondary | ICD-10-CM | POA: Diagnosis not present

## 2022-12-14 ENCOUNTER — Ambulatory Visit (INDEPENDENT_AMBULATORY_CARE_PROVIDER_SITE_OTHER): Payer: PPO

## 2022-12-14 DIAGNOSIS — E538 Deficiency of other specified B group vitamins: Secondary | ICD-10-CM | POA: Diagnosis not present

## 2022-12-14 MED ORDER — CYANOCOBALAMIN 1000 MCG/ML IJ SOLN
1000.0000 ug | Freq: Once | INTRAMUSCULAR | Status: AC
  Administered 2022-12-14: 1000 ug via INTRAMUSCULAR

## 2022-12-14 NOTE — Progress Notes (Signed)
Patient presented for B 12 injection to left deltoid, patient voiced no concerns nor showed any signs of distress during injection. 

## 2022-12-23 DIAGNOSIS — M5416 Radiculopathy, lumbar region: Secondary | ICD-10-CM | POA: Diagnosis not present

## 2023-01-06 DIAGNOSIS — M5441 Lumbago with sciatica, right side: Secondary | ICD-10-CM | POA: Diagnosis not present

## 2023-01-06 DIAGNOSIS — M5416 Radiculopathy, lumbar region: Secondary | ICD-10-CM | POA: Diagnosis not present

## 2023-01-06 DIAGNOSIS — M5442 Lumbago with sciatica, left side: Secondary | ICD-10-CM | POA: Diagnosis not present

## 2023-01-06 DIAGNOSIS — G8929 Other chronic pain: Secondary | ICD-10-CM | POA: Diagnosis not present

## 2023-01-10 ENCOUNTER — Telehealth: Payer: Self-pay | Admitting: Internal Medicine

## 2023-01-10 DIAGNOSIS — E7800 Pure hypercholesterolemia, unspecified: Secondary | ICD-10-CM

## 2023-01-10 DIAGNOSIS — E1065 Type 1 diabetes mellitus with hyperglycemia: Secondary | ICD-10-CM

## 2023-01-10 DIAGNOSIS — I10 Essential (primary) hypertension: Secondary | ICD-10-CM

## 2023-01-10 NOTE — Telephone Encounter (Signed)
 Patient need lab orders.

## 2023-01-11 NOTE — Telephone Encounter (Signed)
 Labs ordered.

## 2023-01-11 NOTE — Addendum Note (Signed)
 Addended by: Rita Ohara D on: 01/11/2023 07:21 AM   Modules accepted: Orders

## 2023-01-17 ENCOUNTER — Other Ambulatory Visit (INDEPENDENT_AMBULATORY_CARE_PROVIDER_SITE_OTHER): Payer: PPO

## 2023-01-17 DIAGNOSIS — E7800 Pure hypercholesterolemia, unspecified: Secondary | ICD-10-CM

## 2023-01-17 DIAGNOSIS — I10 Essential (primary) hypertension: Secondary | ICD-10-CM

## 2023-01-17 DIAGNOSIS — E1065 Type 1 diabetes mellitus with hyperglycemia: Secondary | ICD-10-CM | POA: Diagnosis not present

## 2023-01-17 LAB — LIPID PANEL
Cholesterol: 126 mg/dL (ref 0–200)
HDL: 57.3 mg/dL (ref >39.00)
LDL Cholesterol: 57 mg/dL (ref 0–99)
NonHDL: 68.89
Total CHOL/HDL Ratio: 2
Triglycerides: 60 mg/dL (ref 0.0–149.0)
VLDL: 12 mg/dL (ref 0.0–40.0)

## 2023-01-17 LAB — HEPATIC FUNCTION PANEL
ALT: 11 U/L (ref 0–35)
AST: 14 U/L (ref 0–37)
Albumin: 4.2 g/dL (ref 3.5–5.2)
Alkaline Phosphatase: 94 U/L (ref 39–117)
Bilirubin, Direct: 0.1 mg/dL (ref 0.0–0.3)
Total Bilirubin: 0.7 mg/dL (ref 0.2–1.2)
Total Protein: 6.9 g/dL (ref 6.0–8.3)

## 2023-01-17 LAB — CBC WITH DIFFERENTIAL/PLATELET
Basophils Absolute: 0.1 10*3/uL (ref 0.0–0.1)
Basophils Relative: 1.1 % (ref 0.0–3.0)
Eosinophils Absolute: 0.2 10*3/uL (ref 0.0–0.7)
Eosinophils Relative: 3.1 % (ref 0.0–5.0)
HCT: 36.4 % (ref 36.0–46.0)
Hemoglobin: 12 g/dL (ref 12.0–15.0)
Lymphocytes Relative: 31.1 % (ref 12.0–46.0)
Lymphs Abs: 1.8 10*3/uL (ref 0.7–4.0)
MCHC: 33 g/dL (ref 30.0–36.0)
MCV: 92.4 fL (ref 78.0–100.0)
Monocytes Absolute: 0.6 10*3/uL (ref 0.1–1.0)
Monocytes Relative: 10.1 % (ref 3.0–12.0)
Neutro Abs: 3.1 10*3/uL (ref 1.4–7.7)
Neutrophils Relative %: 54.6 % (ref 43.0–77.0)
Platelets: 229 10*3/uL (ref 150.0–400.0)
RBC: 3.94 Mil/uL (ref 3.87–5.11)
RDW: 15.7 % — ABNORMAL HIGH (ref 11.5–15.5)
WBC: 5.8 10*3/uL (ref 4.0–10.5)

## 2023-01-17 LAB — BASIC METABOLIC PANEL
BUN: 24 mg/dL — ABNORMAL HIGH (ref 6–23)
CO2: 26 meq/L (ref 19–32)
Calcium: 9.7 mg/dL (ref 8.4–10.5)
Chloride: 104 meq/L (ref 96–112)
Creatinine, Ser: 0.98 mg/dL (ref 0.40–1.20)
GFR: 51.05 mL/min — ABNORMAL LOW (ref >60.00)
Glucose, Bld: 173 mg/dL — ABNORMAL HIGH (ref 70–99)
Potassium: 4.5 meq/L (ref 3.5–5.1)
Sodium: 141 meq/L (ref 135–145)

## 2023-01-17 LAB — HEMOGLOBIN A1C: Hgb A1c MFr Bld: 7.5 % — ABNORMAL HIGH (ref 4.6–6.5)

## 2023-01-17 LAB — TSH: TSH: 6.63 u[IU]/mL — ABNORMAL HIGH (ref 0.35–5.50)

## 2023-01-19 ENCOUNTER — Encounter: Payer: Self-pay | Admitting: Internal Medicine

## 2023-01-19 ENCOUNTER — Other Ambulatory Visit: Payer: Self-pay | Admitting: Internal Medicine

## 2023-01-19 ENCOUNTER — Ambulatory Visit (INDEPENDENT_AMBULATORY_CARE_PROVIDER_SITE_OTHER): Payer: PPO | Admitting: Internal Medicine

## 2023-01-19 VITALS — BP 114/70 | HR 88 | Temp 98.0°F | Resp 16 | Ht 62.0 in | Wt 184.8 lb

## 2023-01-19 DIAGNOSIS — R413 Other amnesia: Secondary | ICD-10-CM | POA: Diagnosis not present

## 2023-01-19 DIAGNOSIS — C8599 Non-Hodgkin lymphoma, unspecified, extranodal and solid organ sites: Secondary | ICD-10-CM | POA: Diagnosis not present

## 2023-01-19 DIAGNOSIS — R944 Abnormal results of kidney function studies: Secondary | ICD-10-CM

## 2023-01-19 DIAGNOSIS — E1065 Type 1 diabetes mellitus with hyperglycemia: Secondary | ICD-10-CM | POA: Diagnosis not present

## 2023-01-19 DIAGNOSIS — E038 Other specified hypothyroidism: Secondary | ICD-10-CM | POA: Diagnosis not present

## 2023-01-19 DIAGNOSIS — K219 Gastro-esophageal reflux disease without esophagitis: Secondary | ICD-10-CM

## 2023-01-19 DIAGNOSIS — E785 Hyperlipidemia, unspecified: Secondary | ICD-10-CM | POA: Diagnosis not present

## 2023-01-19 DIAGNOSIS — E1069 Type 1 diabetes mellitus with other specified complication: Secondary | ICD-10-CM

## 2023-01-19 DIAGNOSIS — I10 Essential (primary) hypertension: Secondary | ICD-10-CM

## 2023-01-19 DIAGNOSIS — D649 Anemia, unspecified: Secondary | ICD-10-CM | POA: Diagnosis not present

## 2023-01-19 DIAGNOSIS — I779 Disorder of arteries and arterioles, unspecified: Secondary | ICD-10-CM | POA: Diagnosis not present

## 2023-01-19 DIAGNOSIS — E7800 Pure hypercholesterolemia, unspecified: Secondary | ICD-10-CM

## 2023-01-19 NOTE — Progress Notes (Signed)
Subjective:    Patient ID: Marissa Huff, female    DOB: 1931-10-15, 89 y.o.   MRN: [id number]  Patient here for  Chief Complaint  Patient presents with   Medical Management of Chronic Issues    HPI Here for a scheduled follow up -follow up regarding hypercholesterolemia, diabetes and increased stress/anxiety. S/p left total knee arthroplasy 07/06/22. She is accompanied by her husband. History obtained from both of them. Followed by Dr Gershon Crane - Type 1 diabetes. Adjusted insulin to avoid low sugars. Reviewed CGM from 01/06/23 - 01/19/23 - time active 98% - target range 45%, high 32%, very high 23%. Discussed diet and exercise.  Recent A1c 7.5. breathing overall stable. No bowel issues reported. Discussed her labs. TSH elevated. Appears she has skipped some doses and also may not be taking by itself and waiting to eat or take other meds.     Past Medical History:  Diagnosis Date   Allergy    Arthritis    Carotid artery disease (HCC)    Carpal tunnel syndrome, bilateral    Chronic kidney disease, stage 2, mildly decreased GFR    Diabetes mellitus without complication (HCC)    Diverticulosis    Gastric lymphoma (HCC)    Gastroparesis    GERD (gastroesophageal reflux disease)    History of kidney stones    Hyperlipidemia    Hypertension associated with diabetes (HCC)    Hypothyroidism    Malignant lymphoma (HCC)    gastric   PUD (peptic ulcer disease)    Skin cancer    Spinal stenosis    lumbar laminectomy   Type 1 diabetes mellitus (HCC)    Urinary incontinence    Past Surgical History:  Procedure Laterality Date   antrectomy and vagotomy  1994   CARPAL TUNNEL RELEASE Left 10/24/2008   CARPAL TUNNEL RELEASE Right 06/19/2009   CATARACT EXTRACTION, BILATERAL Bilateral 2014   COLONOSCOPY  05/24/2007   EYE SURGERY     bilateral cataracts   gastric lymphoma  1996   surgery   KNEE ARTHROPLASTY Left 07/06/2022   Procedure: COMPUTER ASSISTED TOTAL KNEE ARTHROPLASTY;  Surgeon:  Donato Heinz, MD;  Location: [facility];  Service: Orthopedics;  Laterality: Left;   KNEE ARTHROSCOPY Left 12/05/2008   KNEE ARTHROSCOPY Right 06/16/2010   LAPAROSCOPIC CHOLECYSTECTOMY  06/1999   LUMBAR LAMINECTOMY  1999   LUMBAR LAMINECTOMY WITH COFLEX 1 LEVEL N/A 09/15/2015   Procedure: Lumbar one-two Laminectomy with placement of coflex;  Surgeon: Barnett Abu, MD;  Location: [facility];  Service: Neurosurgery;  Laterality: N/A;   PARTIAL HYSTERECTOMY  02/1978   fibroid tumors, ovaries not removed   TONSILLECTOMY AND ADENOIDECTOMY  1982   TRIGGER FINGER RELEASE Right 06/19/2009   Family History  Problem Relation Age of Onset   Stroke Mother    Stroke Father    Hypertension Father    Diabetes Brother    Multiple sclerosis Brother    Breast cancer Daughter 6   Colon cancer Neg Hx    Social History   Socioeconomic History   Marital status: Married    Spouse name: Leonette Most   Number of children: 2   Years of education: Not on file   Highest education level: Not on file  Occupational History   Not on file  Tobacco Use   Smoking status: Never   Smokeless tobacco: Never  Vaping Use   Vaping status: Never Used  Substance and Sexual Activity   Alcohol use: No  Alcohol/week: 0.0 standard drinks of alcohol   Drug use: No   Sexual activity: Not Currently    Birth control/protection: Surgical  Other Topics Concern   Not on file  Social History Narrative   married   Social Drivers of Health   Financial Resource Strain: Low Risk  (12/05/2022)   Overall Financial Resource Strain (CARDIA)    Difficulty of Paying Living Expenses: Not hard at all  Food Insecurity: No Food Insecurity (12/05/2022)   Hunger Vital Sign    Worried About Running Out of Food in the Last Year: Never true    Ran Out of Food in the Last Year: Never true  Transportation Needs: No Transportation Needs (12/05/2022)   PRAPARE - Administrator, Civil Service (Medical): No    Lack of  Transportation (Non-Medical): No  Physical Activity: Inactive (12/05/2022)   Exercise Vital Sign    Days of Exercise per Week: 0 days    Minutes of Exercise per Session: 0 min  Stress: Stress Concern Present (12/05/2022)   [employer] of Occupational Health - Occupational Stress Questionnaire    Feeling of Stress : To some extent  Social Connections: Moderately Integrated (12/05/2022)   Social Connection and Isolation Panel [NHANES]    Frequency of Communication with Friends and Family: More than three times a week    Frequency of Social Gatherings with Friends and Family: More than three times a week    Attends Religious Services: More than 4 times per year    Active Member of [employer] or Organizations: No    Attends Banker Meetings: Never    Marital Status: Married     Review of Systems  Constitutional:  Negative for appetite change and unexpected weight change.  HENT:  Negative for congestion and sinus pressure.   Respiratory:  Negative for cough, chest tightness and shortness of breath.   Cardiovascular:  Negative for chest pain and palpitations.  Gastrointestinal:  Negative for diarrhea, nausea and vomiting.  Genitourinary:  Negative for difficulty urinating and dysuria.  Musculoskeletal:  Positive for back pain. Negative for joint swelling and myalgias.  Skin:  Negative for color change and rash.  Neurological:  Negative for dizziness and headaches.  Psychiatric/Behavioral:  Negative for agitation and dysphoric mood.        Objective:     BP 114/70   Pulse 88   Temp 98 F (36.7 C)   Resp 16   Ht 5\' 2"  (1.575 m)   Wt 184 lb 12.8 oz (83.8 kg)   SpO2 97%   BMI 33.80 kg/m  Wt Readings from Last 3 Encounters:  01/19/23 184 lb 12.8 oz (83.8 kg)  12/05/22 179 lb (81.2 kg)  10/14/22 180 lb (81.6 kg)    Physical Exam Vitals reviewed.  Constitutional:      General: She is not in acute distress.    Appearance: Normal appearance.  HENT:     Head:  Normocephalic and atraumatic.     Right Ear: External ear normal.     Left Ear: External ear normal.     Mouth/Throat:     Pharynx: No oropharyngeal exudate or posterior oropharyngeal erythema.  Eyes:     General: No scleral icterus.       Right eye: No discharge.        Left eye: No discharge.     Conjunctiva/sclera: Conjunctivae normal.  Neck:     Thyroid: No thyromegaly.  Cardiovascular:     Rate and Rhythm:  Normal rate and regular rhythm.  Pulmonary:     Effort: No respiratory distress.     Breath sounds: Normal breath sounds. No wheezing.  Abdominal:     General: Bowel sounds are normal.     Palpations: Abdomen is soft.     Tenderness: There is no abdominal tenderness.  Musculoskeletal:        General: No swelling or tenderness.     Cervical back: Neck supple. No tenderness.  Lymphadenopathy:     Cervical: No cervical adenopathy.  Skin:    Findings: No erythema or rash.  Neurological:     Mental Status: She is alert.  Psychiatric:        Mood and Affect: Mood normal.        Behavior: Behavior normal.         Outpatient Encounter Medications as of 01/19/2023  Medication Sig   acetaminophen (TYLENOL) 325 MG tablet Take 1-2 tablets (325-650 mg total) by mouth every 6 (six) hours as needed for mild pain (pain score 1-3 or temp > 100.5).   albuterol (VENTOLIN HFA) 108 (90 Base) MCG/ACT inhaler TAKE 2 PUFFS BY MOUTH EVERY 6 HOURS AS NEEDED FOR WHEEZE OR SHORTNESS OF BREATH   busPIRone (BUSPAR) 5 MG tablet TAKE 1 TABLET BY MOUTH TWICE A DAY   clobetasol (TEMOVATE) 0.05 % external solution Apply 1 Application topically 2 (two) times daily.   Continuous Blood Gluc Receiver (FREESTYLE LIBRE 14 DAY READER) DEVI Use 1 each as needed .  Use to test blood sugar   Continuous Blood Gluc Sensor (FREESTYLE LIBRE 14 DAY SENSOR) MISC Use 1 each every 14 (fourteen) days   dextromethorphan-guaiFENesin (MUCINEX DM) 30-600 MG 12hr tablet Take 1 tablet by mouth daily as needed for cough.    fexofenadine (ALLEGRA) 180 MG tablet Take 180 mg by mouth daily.   fluticasone (FLONASE) 50 MCG/ACT nasal spray SPRAY 2 SPRAYS INTO EACH NOSTRIL EVERY DAY (Patient taking differently: Place 1 spray into both nostrils at bedtime. SPRAY 2 SPRAYS INTO EACH NOSTRIL EVERY DAY)   glucagon (GLUCAGON EMERGENCY) 1 MG injection Inject 1 mg into the muscle once as needed.   insulin aspart (NOVOLOG FLEXPEN) 100 UNIT/ML FlexPen Inject 4-13 Units into the skin 3 (three) times daily with meals. Sliding scale   LANTUS SOLOSTAR 100 UNIT/ML Solostar Pen Inject 20-26 Units into the skin at bedtime. Sliding scale   levothyroxine (SYNTHROID) 200 MCG tablet TAKE 1 TABLET (200 MCG TOTAL) BY MOUTH DAILY BEFORE BREAKFAST.   MULTIPLE VITAMINS-MINERALS PO Take 1 tablet by mouth daily.   Olopatadine HCl (PATADAY) 0.7 % SOLN Apply 1 drop to eye daily as needed.   OVER THE COUNTER MEDICATION Take 2 capsules by mouth daily. Cerebra Brain Health   OVER THE COUNTER MEDICATION Take 2 capsules by mouth daily as needed. Gluco Switch Blood sugar support   pantoprazole (PROTONIX) 40 MG tablet TAKE 1 TABLET BY MOUTH TWICE A DAY   Polyethyl Glycol-Propyl Glycol (SYSTANE OP) Place 1 drop into both eyes at bedtime.   Polyethylene Glycol 3350 (MIRALAX PO) Take 1 Dose by mouth daily as needed.   rosuvastatin (CRESTOR) 5 MG tablet TAKE 1 TABLET BY MOUTH THREE TIMES A WEEK. (Patient taking differently: Take 5 mg by mouth 3 (three) times a week. MONDAY, WEDNESDAY, FRIDAY)   valsartan-hydrochlorothiazide (DIOVAN-HCT) 160-12.5 MG tablet TAKE 1 TABLET BY MOUTH EVERY DAY   Wheat Dextrin (BENEFIBER) POWD Take 1 Dose by mouth daily as needed.   [DISCONTINUED] amLODipine (NORVASC) 5 MG tablet  TAKE 1 TABLET (5 MG TOTAL) BY MOUTH DAILY.   [DISCONTINUED] diphenhydrAMINE (BENADRYL) 25 MG tablet Take 25 mg by mouth at bedtime as needed. (Patient not taking: Reported on 12/05/2022)   [DISCONTINUED] ketoconazole (NIZORAL) 2 % shampoo Apply 1 Application  topically 3 (three) times a week. (Patient not taking: Reported on 12/05/2022)   [DISCONTINUED] ondansetron (ZOFRAN) 4 MG tablet Take 1 tablet (4 mg total) by mouth every 6 (six) hours as needed for nausea. (Patient not taking: Reported on 12/05/2022)   [DISCONTINUED] senna-docusate (SENOKOT-S) 8.6-50 MG tablet Take 1 tablet by mouth 2 (two) times daily. (Patient not taking: Reported on 12/05/2022)   [DISCONTINUED] traMADol (ULTRAM) 50 MG tablet Take 1 tablet (50 mg total) by mouth every 4 (four) hours as needed for moderate pain. (Patient not taking: Reported on 12/05/2022)   No facility-administered encounter medications on file as of 01/19/2023.     Lab Results  Component Value Date   WBC 5.8 01/17/2023   HGB 12.0 01/17/2023   HCT 36.4 01/17/2023   PLT 229.0 01/17/2023   GLUCOSE 173 (H) 01/17/2023   CHOL 126 01/17/2023   TRIG 60.0 01/17/2023   HDL 57.30 01/17/2023   LDLCALC 57 01/17/2023   ALT 11 01/17/2023   AST 14 01/17/2023   NA 141 01/17/2023   K 4.5 01/17/2023   CL 104 01/17/2023   CREATININE 0.98 01/17/2023   BUN 24 (H) 01/17/2023   CO2 26 01/17/2023   TSH 6.63 (H) 01/17/2023   HGBA1C 7.5 (H) 01/17/2023   MICROALBUR 2.3 (H) 07/16/2021    MM 3D SCREENING MAMMOGRAM BILATERAL BREAST Result Date: 10/14/2022 CLINICAL DATA:  Screening. EXAM: DIGITAL SCREENING BILATERAL MAMMOGRAM WITH TOMOSYNTHESIS AND CAD TECHNIQUE: Bilateral screening digital craniocaudal and mediolateral oblique mammograms were obtained. Bilateral screening digital breast tomosynthesis was performed. The images were evaluated with computer-aided detection. COMPARISON:  Previous exam(s). ACR Breast Density Category b: There are scattered areas of fibroglandular density. FINDINGS: There are no findings suspicious for malignancy. IMPRESSION: No mammographic evidence of malignancy. A result letter of this screening mammogram will be mailed directly to the patient. RECOMMENDATION: Screening mammogram in one year.  (Code:SM-B-01Y) BI-RADS CATEGORY  1: Negative. Electronically Signed   By: Emmaline Kluver M.D.   On: 10/14/2022 09:42       Assessment & Plan:  Type 1 diabetes mellitus with hyperglycemia (HCC) Assessment & Plan: Followed by endocrinology.  Currently on lantus and uses novolog SSI.  Outside sugars reviewed.  Time active 98%.  Target range 45%, high 32% and very high 23%.  No low sugars. Discussed diet and exercise. Continue f/u with Dr Gershon Crane. Recent A1c discussed -   Lab Results  Component Value Date   HGBA1C 7.5 (H) 01/17/2023      Other specified hypothyroidism Assessment & Plan: On synthroid.  Elevated tsh recent check. Slight elevation. Appears to have skipped some doses and not taking by itself. Hold on making adjustments in the medication. Discussed taking properly. Follow tsh. Recheck in 6 weeks to confirm wnl.   Orders: -     TSH; Future  Primary hypertension Assessment & Plan: Currently on valsartan/hctz and amlodipine. Blood pressure as outlined.  Continue current medications.  Follow metabolic panel.     Hypercholesterolemia Assessment & Plan: Continue crestor.  Low cholesterol diet and exercise.  Follow lipid panel and liver function tests.   Lab Results  Component Value Date   CHOL 126 01/17/2023   HDL 57.30 01/17/2023   LDLCALC 57 01/17/2023   TRIG  60.0 01/17/2023   CHOLHDL 2 01/17/2023     Decreased GFR -     Basic metabolic panel; Future  Memory change -     Vitamin B12; Future  Anemia, unspecified type Assessment & Plan: Hgb wnl 01/17/23.    Carotid artery disease, unspecified laterality (HCC) Assessment & Plan: Carotid duplex 02/2022 reveals some progression on the right side with common carotid artery stenosis that now appears to be 50% or greater and internal carotid artery stenosis that remains in the 1 to 39% range on the right.  The left internal carotid artery at the 1 to 39% range and stable from previous studies.  No role for  intervention in an asymptomatic 89 year old.  Continue current medical regimen which includes Crestor and aspirin.  Recheck in 1 year.    Gastric lymphoma (HCC) Assessment & Plan: History of gastric lymphoma. miralax has helped bowels.  Was previously referred back to GI for evaluation and question of need for EGD given history.  Saw GI 07/08/21 - ordered UGI with barium swallow.  Pt had previously preferred EGD. Have discussed need for f/u with GI.  F/u phone call with GI reviewed.  Pt doing well.  Elected to follow.      Gastroesophageal reflux disease, unspecified whether esophagitis present Assessment & Plan: Continue on protonix.     Hyperlipidemia due to type 1 diabetes mellitus [facility]) Assessment & Plan: Continue crestor.  Low cholesterol diet and exercise.  Follow lipid panel and liver function tests.  Followed by endocrinology.  Currently on lantus and uses novolog SSI.  Sugars as outlined. Discussed diet and exercise. Continue f/u with Dr Gershon Crane.       Dale [place], MD

## 2023-01-22 ENCOUNTER — Encounter: Payer: Self-pay | Admitting: Internal Medicine

## 2023-01-22 NOTE — Assessment & Plan Note (Signed)
Followed by endocrinology.  Currently on lantus and uses novolog SSI.  Outside sugars reviewed.  Time active 98%.  Target range 45%, high 32% and very high 23%.  No low sugars. Discussed diet and exercise. Continue f/u with Dr Gershon Crane. Recent A1c discussed -   Lab Results  Component Value Date   HGBA1C 7.5 (H) 01/17/2023

## 2023-01-22 NOTE — Assessment & Plan Note (Signed)
 Continue on protonix.

## 2023-01-22 NOTE — Assessment & Plan Note (Signed)
Continue crestor.  Low cholesterol diet and exercise.  Follow lipid panel and liver function tests.  Followed by endocrinology.  Currently on lantus and uses novolog SSI.  Sugars as outlined. Discussed diet and exercise. Continue f/u with Dr Gershon Crane.

## 2023-01-22 NOTE — Assessment & Plan Note (Signed)
Currently on valsartan/hctz and amlodipine. Blood pressure as outlined.  Continue current medications.  Follow metabolic panel.

## 2023-01-22 NOTE — Assessment & Plan Note (Signed)
History of gastric lymphoma. miralax has helped bowels.  Was previously referred back to GI for evaluation and question of need for EGD given history.  Saw GI 07/08/21 - ordered UGI with barium swallow.  Pt had previously preferred EGD. Have discussed need for f/u with GI.  F/u phone call with GI reviewed.  Pt doing well.  Elected to follow.

## 2023-01-22 NOTE — Assessment & Plan Note (Addendum)
On synthroid.  Elevated tsh recent check. Slight elevation. Appears to have skipped some doses and not taking by itself. Hold on making adjustments in the medication. Discussed taking properly. Follow tsh. Recheck in 6 weeks to confirm wnl.

## 2023-01-22 NOTE — Assessment & Plan Note (Signed)
Carotid duplex 02/2022 reveals some progression on the right side with common carotid artery stenosis that now appears to be 50% or greater and internal carotid artery stenosis that remains in the 1 to 39% range on the right.  The left internal carotid artery at the 1 to 39% range and stable from previous studies.  No role for intervention in an asymptomatic 89 year old.  Continue current medical regimen which includes Crestor and aspirin.  Recheck in 1 year.

## 2023-01-22 NOTE — Assessment & Plan Note (Signed)
Continue crestor.  Low cholesterol diet and exercise.  Follow lipid panel and liver function tests.   Lab Results  Component Value Date   CHOL 126 01/17/2023   HDL 57.30 01/17/2023   LDLCALC 57 01/17/2023   TRIG 60.0 01/17/2023   CHOLHDL 2 01/17/2023

## 2023-01-22 NOTE — Assessment & Plan Note (Signed)
Hgb wnl 01/17/23.

## 2023-01-25 ENCOUNTER — Other Ambulatory Visit: Payer: Self-pay | Admitting: Internal Medicine

## 2023-01-27 DIAGNOSIS — H903 Sensorineural hearing loss, bilateral: Secondary | ICD-10-CM | POA: Diagnosis not present

## 2023-01-30 ENCOUNTER — Encounter: Payer: Self-pay | Admitting: Internal Medicine

## 2023-02-10 ENCOUNTER — Other Ambulatory Visit: Payer: Self-pay | Admitting: Internal Medicine

## 2023-02-20 ENCOUNTER — Other Ambulatory Visit (INDEPENDENT_AMBULATORY_CARE_PROVIDER_SITE_OTHER): Payer: Self-pay | Admitting: Vascular Surgery

## 2023-02-20 DIAGNOSIS — I779 Disorder of arteries and arterioles, unspecified: Secondary | ICD-10-CM

## 2023-02-21 ENCOUNTER — Ambulatory Visit (INDEPENDENT_AMBULATORY_CARE_PROVIDER_SITE_OTHER): Payer: PPO | Admitting: Vascular Surgery

## 2023-02-21 ENCOUNTER — Ambulatory Visit (INDEPENDENT_AMBULATORY_CARE_PROVIDER_SITE_OTHER): Payer: PPO

## 2023-02-21 ENCOUNTER — Encounter (INDEPENDENT_AMBULATORY_CARE_PROVIDER_SITE_OTHER): Payer: Self-pay | Admitting: Vascular Surgery

## 2023-02-21 VITALS — BP 134/63 | HR 79 | Resp 18 | Ht 62.0 in | Wt 184.0 lb

## 2023-02-21 DIAGNOSIS — I779 Disorder of arteries and arterioles, unspecified: Secondary | ICD-10-CM

## 2023-02-21 DIAGNOSIS — I10 Essential (primary) hypertension: Secondary | ICD-10-CM

## 2023-02-21 DIAGNOSIS — E7800 Pure hypercholesterolemia, unspecified: Secondary | ICD-10-CM

## 2023-02-21 NOTE — Assessment & Plan Note (Signed)
 Her carotid duplex today reveals stable 1 to 39% ICA stenosis bilaterally without significant progression from previous studies.  No role for intervention at this level.  Continue to follow annually.

## 2023-02-21 NOTE — Progress Notes (Signed)
 MRN : [id number]  Marissa Huff is a 89 y.o. ([birth year]) female who presents with chief complaint of  Chief Complaint  Patient presents with   Follow-up    f/u in 1 year with carotid  .  History of Present Illness: Patient returns today in follow up of her carotid disease.  She has had a lot of medical issues in the past year, but nothing related to her carotid disease.  She has had no stroke or TIA symptoms. Specifically, the patient denies amaurosis fugax, speech or swallowing difficulties, or arm or leg weakness or numbness.  Her carotid duplex today reveals stable 1 to 39% ICA stenosis bilaterally without significant progression from previous studies.  Current Outpatient Medications  Medication Sig Dispense Refill   acetaminophen (TYLENOL) 325 MG tablet Take 1-2 tablets (325-650 mg total) by mouth every 6 (six) hours as needed for mild pain (pain score 1-3 or temp > 100.5).     albuterol (VENTOLIN HFA) 108 (90 Base) MCG/ACT inhaler TAKE 2 PUFFS BY MOUTH EVERY 6 HOURS AS NEEDED FOR WHEEZE OR SHORTNESS OF BREATH 18 each 2   amLODipine (NORVASC) 5 MG tablet TAKE 1 TABLET (5 MG TOTAL) BY MOUTH DAILY. 90 tablet 1   busPIRone (BUSPAR) 5 MG tablet TAKE 1 TABLET (5 MG TOTAL) BY MOUTH DAILY. 30 tablet 1   clobetasol (TEMOVATE) 0.05 % external solution Apply 1 Application topically 2 (two) times daily.     Continuous Blood Gluc Receiver (FREESTYLE LIBRE 14 DAY READER) DEVI Use 1 each as needed .  Use to test blood sugar     Continuous Blood Gluc Sensor (FREESTYLE LIBRE 14 DAY SENSOR) MISC Use 1 each every 14 (fourteen) days     dextromethorphan-guaiFENesin (MUCINEX DM) 30-600 MG 12hr tablet Take 1 tablet by mouth daily as needed for cough.     fexofenadine (ALLEGRA) 180 MG tablet Take 180 mg by mouth daily.     fluticasone (FLONASE) 50 MCG/ACT nasal spray SPRAY 2 SPRAYS INTO EACH NOSTRIL EVERY DAY 48 mL 3   glucagon (GLUCAGON EMERGENCY) 1 MG injection Inject 1 mg into the muscle once as  needed. 1 each 3   insulin aspart (NOVOLOG FLEXPEN) 100 UNIT/ML FlexPen Inject 4-13 Units into the skin 3 (three) times daily with meals. Sliding scale     LANTUS SOLOSTAR 100 UNIT/ML Solostar Pen Inject 20-26 Units into the skin at bedtime. Sliding scale  4   levothyroxine (SYNTHROID) 200 MCG tablet TAKE 1 TABLET (200 MCG TOTAL) BY MOUTH DAILY BEFORE BREAKFAST. 90 tablet 2   MULTIPLE VITAMINS-MINERALS PO Take 1 tablet by mouth daily.     Olopatadine HCl (PATADAY) 0.7 % SOLN Apply 1 drop to eye daily as needed.     OVER THE COUNTER MEDICATION Take 2 capsules by mouth daily. Cerebra Brain Health     OVER THE COUNTER MEDICATION Take 2 capsules by mouth daily as needed. Gluco Switch Blood sugar support     pantoprazole (PROTONIX) 40 MG tablet TAKE 1 TABLET BY MOUTH TWICE A DAY 180 tablet 1   Polyethyl Glycol-Propyl Glycol (SYSTANE OP) Place 1 drop into both eyes at bedtime.     Polyethylene Glycol 3350 (MIRALAX PO) Take 1 Dose by mouth daily as needed.     rosuvastatin (CRESTOR) 5 MG tablet TAKE 1 TABLET BY MOUTH THREE TIMES A WEEK. (Patient taking differently: Take 5 mg by mouth 3 (three) times a week. MONDAY, WEDNESDAY, FRIDAY) 40 tablet 1   traMADol (ULTRAM) 50 MG  tablet Take 50 mg by mouth as needed.     valsartan-hydrochlorothiazide (DIOVAN-HCT) 160-12.5 MG tablet TAKE 1 TABLET BY MOUTH EVERY DAY 90 tablet 1   Wheat Dextrin (BENEFIBER) POWD Take 1 Dose by mouth daily as needed.     No current facility-administered medications for this visit.    Past Medical History:  Diagnosis Date   Allergy    Arthritis    Carotid artery disease (HCC)    Carpal tunnel syndrome, bilateral    Chronic kidney disease, stage 2, mildly decreased GFR    Diabetes mellitus without complication (HCC)    Diverticulosis    Gastric lymphoma (HCC)    Gastroparesis    GERD (gastroesophageal reflux disease)    History of kidney stones    Hyperlipidemia    Hypertension associated with diabetes (HCC)     Hypothyroidism    Malignant lymphoma (HCC)    gastric   PUD (peptic ulcer disease)    Skin cancer    Spinal stenosis    lumbar laminectomy   Type 1 diabetes mellitus (HCC)    Urinary incontinence     Past Surgical History:  Procedure Laterality Date   antrectomy and vagotomy  1994   CARPAL TUNNEL RELEASE Left 10/24/2008   CARPAL TUNNEL RELEASE Right 06/19/2009   CATARACT EXTRACTION, BILATERAL Bilateral 2014   COLONOSCOPY  05/24/2007   EYE SURGERY     bilateral cataracts   gastric lymphoma  1996   surgery   KNEE ARTHROPLASTY Left 07/06/2022   Procedure: COMPUTER ASSISTED TOTAL KNEE ARTHROPLASTY;  Surgeon: Donato Heinz, MD;  Location: [facility];  Service: Orthopedics;  Laterality: Left;   KNEE ARTHROSCOPY Left 12/05/2008   KNEE ARTHROSCOPY Right 06/16/2010   LAPAROSCOPIC CHOLECYSTECTOMY  06/1999   LUMBAR LAMINECTOMY  1999   LUMBAR LAMINECTOMY WITH COFLEX 1 LEVEL N/A 09/15/2015   Procedure: Lumbar one-two Laminectomy with placement of coflex;  Surgeon: Barnett Abu, MD;  Location: [facility];  Service: Neurosurgery;  Laterality: N/A;   PARTIAL HYSTERECTOMY  02/1978   fibroid tumors, ovaries not removed   TONSILLECTOMY AND ADENOIDECTOMY  1982   TRIGGER FINGER RELEASE Right 06/19/2009     Social History   Tobacco Use   Smoking status: Never   Smokeless tobacco: Never  Vaping Use   Vaping status: Never Used  Substance Use Topics   Alcohol use: No    Alcohol/week: 0.0 standard drinks of alcohol   Drug use: No      Family History  Problem Relation Age of Onset   Stroke Mother    Stroke Father    Hypertension Father    Diabetes Brother    Multiple sclerosis Brother    Breast cancer Daughter 23   Colon cancer Neg Hx      Allergies  Allergen Reactions   Penicillins Rash    IgE = 10 (WNL) on 06/28/2022    REVIEW OF SYSTEMS (Negative unless checked)   Constitutional: [] Weight loss  [] Fever  [] Chills Cardiac: [] Chest pain   [] Chest pressure   [] Palpitations    [] Shortness of breath when laying flat   [] Shortness of breath at rest   [] Shortness of breath with exertion. Vascular:  [] Pain in legs with walking   [] Pain in legs at rest   [] Pain in legs when laying flat   [] Claudication   [] Pain in feet when walking  [] Pain in feet at rest  [] Pain in feet when laying flat   [] History of DVT   [] Phlebitis   []   Swelling in legs   [] Varicose veins   [] Non-healing ulcers Pulmonary:   [] Uses home oxygen   [] Productive cough   [] Hemoptysis   [] Wheeze  [] COPD   [] Asthma Neurologic:  [] Dizziness  [] Blackouts   [] Seizures   [] History of stroke   [] History of TIA  [] Aphasia   [] Temporary blindness   [] Dysphagia   [] Weakness or numbness in arms   [] Weakness or numbness in legs Musculoskeletal:  [x] Arthritis   [] Joint swelling   [] Joint pain   [x] Low back pain Hematologic:  [] Easy bruising  [] Easy bleeding   [] Hypercoagulable state   [] Anemic  [] Hepatitis Gastrointestinal:  [] Blood in stool   [] Vomiting blood  [x] Gastroesophageal reflux/heartburn   [] Difficulty swallowing. Genitourinary:  [x] Chronic kidney disease   [] Difficult urination  [] Frequent urination  [] Burning with urination   [] Blood in urine Skin:  [] Rashes   [] Ulcers   [] Wounds Psychological:  [] History of anxiety   []  History of major depression.  Physical Examination  BP 134/63   Pulse 79   Resp 18   Ht 5\' 2"  (1.575 m)   Wt 184 lb (83.5 kg)   BMI 33.65 kg/m  Gen:  WD/WN, NAD. Appears younger than stated age. Head: [place]/AT, No temporalis wasting. Ear/Nose/Throat: Hearing grossly intact, nares w/o erythema or drainage Eyes: Conjunctiva clear. Sclera non-icteric Neck: Supple.  Trachea midline. No bruit.  Pulmonary:  Good air movement, no use of accessory muscles.  Cardiac: RRR, no JVD Vascular:  Vessel Right Left  Radial Palpable Palpable               Musculoskeletal: M/S 5/5 throughout.  No deformity or atrophy. No edema. Neurologic: Sensation grossly intact in extremities.  Symmetrical.   Speech is fluent.  Psychiatric: Judgment intact, Mood & affect appropriate for pt's clinical situation. Dermatologic: No rashes or ulcers noted.  No cellulitis or open wounds.      Labs Recent Results (from the past 2160 hours)  TSH     Status: Abnormal   Collection Time: 01/17/23 10:46 AM  Result Value Ref Range   TSH 6.63 (H) 0.35 - 5.50 uIU/mL  Hepatic function panel     Status: None   Collection Time: 01/17/23 10:46 AM  Result Value Ref Range   Total Bilirubin 0.7 0.2 - 1.2 mg/dL   Bilirubin, Direct 0.1 0.0 - 0.3 mg/dL   Alkaline Phosphatase 94 39 - 117 U/L   AST 14 0 - 37 U/L   ALT 11 0 - 35 U/L   Total Protein 6.9 6.0 - 8.3 g/dL   Albumin 4.2 3.5 - 5.2 g/dL  Hemoglobin Z6X     Status: Abnormal   Collection Time: 01/17/23 10:46 AM  Result Value Ref Range   Hgb A1c MFr Bld 7.5 (H) 4.6 - 6.5 %    Comment: Glycemic Control Guidelines for People with Diabetes:Non Diabetic:  <6%Goal of Therapy: <7%Additional Action Suggested:  >8%   Lipid panel     Status: None   Collection Time: 01/17/23 10:46 AM  Result Value Ref Range   Cholesterol 126 0 - 200 mg/dL    Comment: ATP III Classification       Desirable:  < 200 mg/dL               Borderline High:  200 - 239 mg/dL          High:  > = 096 mg/dL   Triglycerides 04.5 0.0 - 149.0 mg/dL    Comment: Normal:  <409 mg/dLBorderline High:  150 -  199 mg/dL   HDL 40.98 >11.91 mg/dL   VLDL 47.8 0.0 - 29.5 mg/dL   LDL Cholesterol 57 0 - 99 mg/dL   Total CHOL/HDL Ratio 2     Comment:                Men          Women1/2 Average Risk     3.4          3.3Average Risk          5.0          4.42X Average Risk          9.6          7.13X Average Risk          15.0          11.0                       NonHDL 68.89     Comment: NOTE:  Non-HDL goal should be 30 mg/dL higher than patient's LDL goal (i.e. LDL goal of < 70 mg/dL, would have non-HDL goal of < 100 mg/dL)  Basic metabolic panel     Status: Abnormal   Collection Time: 01/17/23 10:46 AM   Result Value Ref Range   Sodium 141 135 - 145 mEq/L   Potassium 4.5 3.5 - 5.1 mEq/L   Chloride 104 96 - 112 mEq/L   CO2 26 19 - 32 mEq/L   Glucose, Bld 173 (H) 70 - 99 mg/dL   BUN 24 (H) 6 - 23 mg/dL   Creatinine, Ser 6.21 0.40 - 1.20 mg/dL   GFR 30.86 (L) >57.84 mL/min    Comment: Calculated using the CKD-EPI Creatinine Equation (2021)   Calcium 9.7 8.4 - 10.5 mg/dL  CBC with Differential/Platelet     Status: Abnormal   Collection Time: 01/17/23 10:46 AM  Result Value Ref Range   WBC 5.8 4.0 - 10.5 K/uL   RBC 3.94 3.87 - 5.11 Mil/uL   Hemoglobin 12.0 12.0 - 15.0 g/dL   HCT 69.6 29.5 - 28.4 %   MCV 92.4 78.0 - 100.0 fl   MCHC 33.0 30.0 - 36.0 g/dL   RDW 13.2 (H) 44.0 - 10.2 %   Platelets 229.0 150.0 - 400.0 K/uL   Neutrophils Relative % 54.6 43.0 - 77.0 %   Lymphocytes Relative 31.1 12.0 - 46.0 %   Monocytes Relative 10.1 3.0 - 12.0 %   Eosinophils Relative 3.1 0.0 - 5.0 %   Basophils Relative 1.1 0.0 - 3.0 %   Neutro Abs 3.1 1.4 - 7.7 K/uL   Lymphs Abs 1.8 0.7 - 4.0 K/uL   Monocytes Absolute 0.6 0.1 - 1.0 K/uL   Eosinophils Absolute 0.2 0.0 - 0.7 K/uL   Basophils Absolute 0.1 0.0 - 0.1 K/uL    Radiology No results found.  Assessment/Plan  Carotid artery disease, unspecified laterality (HCC) Her carotid duplex today reveals stable 1 to 39% ICA stenosis bilaterally without significant progression from previous studies.  No role for intervention at this level.  Continue to follow annually.  Hypercholesterolemia lipid control important in reducing the progression of atherosclerotic disease. Continue statin therapy.     Hypertension blood pressure control important in reducing the progression of atherosclerotic disease. On appropriate oral medications.   Type II diabetes mellitus (HCC) blood glucose control important in reducing the progression of atherosclerotic disease. Also, involved in wound healing. On appropriate medications.  Festus Barren, MD  02/21/2023 2:32  PM    This note was created with Dragon medical transcription system.  Any errors from dictation are purely unintentional

## 2023-02-23 ENCOUNTER — Other Ambulatory Visit (INDEPENDENT_AMBULATORY_CARE_PROVIDER_SITE_OTHER): Payer: PPO

## 2023-02-23 DIAGNOSIS — E038 Other specified hypothyroidism: Secondary | ICD-10-CM | POA: Diagnosis not present

## 2023-02-23 DIAGNOSIS — R944 Abnormal results of kidney function studies: Secondary | ICD-10-CM

## 2023-02-23 DIAGNOSIS — R413 Other amnesia: Secondary | ICD-10-CM

## 2023-02-24 DIAGNOSIS — M5416 Radiculopathy, lumbar region: Secondary | ICD-10-CM | POA: Diagnosis not present

## 2023-02-24 LAB — BASIC METABOLIC PANEL
BUN: 28 mg/dL — ABNORMAL HIGH (ref 6–23)
CO2: 28 meq/L (ref 19–32)
Calcium: 9.7 mg/dL (ref 8.4–10.5)
Chloride: 103 meq/L (ref 96–112)
Creatinine, Ser: 0.94 mg/dL (ref 0.40–1.20)
GFR: 53.63 mL/min — ABNORMAL LOW (ref >60.00)
Glucose, Bld: 160 mg/dL — ABNORMAL HIGH (ref 70–99)
Potassium: 4 meq/L (ref 3.5–5.1)
Sodium: 141 meq/L (ref 135–145)

## 2023-02-24 LAB — TSH: TSH: 0.9 u[IU]/mL (ref 0.35–5.50)

## 2023-02-24 LAB — VITAMIN B12: Vitamin B-12: >1537 pg/mL — ABNORMAL HIGH (ref 211–911)

## 2023-03-11 ENCOUNTER — Other Ambulatory Visit: Payer: Self-pay | Admitting: Internal Medicine

## 2023-04-13 DIAGNOSIS — M5416 Radiculopathy, lumbar region: Secondary | ICD-10-CM | POA: Diagnosis not present

## 2023-04-13 DIAGNOSIS — M5441 Lumbago with sciatica, right side: Secondary | ICD-10-CM | POA: Diagnosis not present

## 2023-04-13 DIAGNOSIS — M5442 Lumbago with sciatica, left side: Secondary | ICD-10-CM | POA: Diagnosis not present

## 2023-04-13 DIAGNOSIS — G8929 Other chronic pain: Secondary | ICD-10-CM | POA: Diagnosis not present

## 2023-04-14 DIAGNOSIS — E1159 Type 2 diabetes mellitus with other circulatory complications: Secondary | ICD-10-CM | POA: Diagnosis not present

## 2023-04-14 DIAGNOSIS — E1029 Type 1 diabetes mellitus with other diabetic kidney complication: Secondary | ICD-10-CM | POA: Diagnosis not present

## 2023-04-14 DIAGNOSIS — R809 Proteinuria, unspecified: Secondary | ICD-10-CM | POA: Diagnosis not present

## 2023-04-14 DIAGNOSIS — I152 Hypertension secondary to endocrine disorders: Secondary | ICD-10-CM | POA: Diagnosis not present

## 2023-04-14 DIAGNOSIS — E039 Hypothyroidism, unspecified: Secondary | ICD-10-CM | POA: Diagnosis not present

## 2023-04-14 DIAGNOSIS — E1069 Type 1 diabetes mellitus with other specified complication: Secondary | ICD-10-CM | POA: Diagnosis not present

## 2023-04-14 DIAGNOSIS — E785 Hyperlipidemia, unspecified: Secondary | ICD-10-CM | POA: Diagnosis not present

## 2023-04-18 DIAGNOSIS — H353132 Nonexudative age-related macular degeneration, bilateral, intermediate dry stage: Secondary | ICD-10-CM | POA: Diagnosis not present

## 2023-04-18 DIAGNOSIS — E119 Type 2 diabetes mellitus without complications: Secondary | ICD-10-CM | POA: Diagnosis not present

## 2023-04-18 DIAGNOSIS — Z961 Presence of intraocular lens: Secondary | ICD-10-CM | POA: Diagnosis not present

## 2023-04-18 LAB — HM DIABETES EYE EXAM

## 2023-04-20 ENCOUNTER — Other Ambulatory Visit: Payer: Self-pay | Admitting: Internal Medicine

## 2023-04-30 ENCOUNTER — Other Ambulatory Visit: Payer: Self-pay | Admitting: Internal Medicine

## 2023-05-10 ENCOUNTER — Ambulatory Visit: Payer: Self-pay

## 2023-05-10 NOTE — Telephone Encounter (Signed)
 FYI   Called patient and husband. Patient is doing ok. No acute issues. Husband thinks this was a fluke with the bp machine from the insurance nurse. Her bp was 112/34. Patient just got up so he is going to give her a little while to get moving and then recheck her BP. Will call in with reading. Has a f/u appt next week.

## 2023-05-10 NOTE — Telephone Encounter (Signed)
  Chief Complaint: low DBP Symptoms: asymptomatic Frequency: yesterday Pertinent Negatives: Patient denies any sx Disposition: [] ED /[] Urgent Care (no appt availability in office) / [x] Appointment(In office/virtual)/ []  [facility]/ [] Home Care/ [x] Refused Recommended Disposition /[]  Mobile Bus/ [x]  Follow-up with PCP Additional Notes: Pt husband, Ethelle Herb, calling to report low BP reading noted at yesterday's annual wellness at home visit. Ethelle Herb reports 112/34 and that clinician took manually with stethoscope x 3. Clinician told husband to report to PCP which is why he called NT today. Per Ethelle Herb, pt is asymptomatic and typically takes BP medication in AM, but unsure when she took it yesterday in relation to the home visit. Pt does not have BP machine to re-measure at home. Triager attempted to schedule acute visit with alternate provider, but pt declined d/t no access with PCP. Triager reinforced importance of rechecking BP ASAP. Ethelle Herb reports that he will get someone from the fire station to recheck BP and report back to NT to see if acute visit is still needed. Caregiver verbalized understanding and to call back with new BP value/worsening symptoms.     Copied from CRM [id number]. Topic: Clinical - Red Word Triage >> May 10, 2023  8:44 AM Dewanda Foots wrote: Red Word that prompted transfer to Nurse Triage: Pt's husband, Hollan Mewes is calling to report a concern of [place]'s blood pressure reading yesterday. Visiting nurse came in and checked her blood pressure yesterday of 112/34 and wanted to report this to the PCP. Reason for Disposition  Diastolic BP < 50 mm Hg  Answer Assessment - Initial Assessment Questions 1. BLOOD PRESSURE: "What is the blood pressure?" "Did you take at least two measurements 5 minutes apart?"     112/34 on 05/09/22 by insurance RN? Performing annual wellness (per husband, clinician took x3) 2. ONSET: "When did you take your blood pressure?"      yesterday 3. HOW: "How did you obtain the blood pressure?" (e.g., visiting nurse, automatic home BP monitor)    Husband reports manually with a stethoscope 4. HISTORY: "Do you have a history of low blood pressure?" "What is your blood pressure normally?"     No, HTN Husband reports BP reports BP is historically "good" 5. MEDICINES: "Are you taking any medications for blood pressure?" If Yes, ask: "Have they been changed recently?"     Denies Pt is on amLODipine  (NORVASC ) 5 MG tablet - takes in AM, husband unsure what time she took AM pills yesterday, endorses some forgetfulness 6. PULSE RATE: "Do you know what your pulse rate is?"      unknown 7. OTHER SYMPTOMS: "Have you been sick recently?" "Have you had a recent injury?"     "She's fine, like she's always been"  Protocols used: Blood Pressure - Low-A-AH

## 2023-05-10 NOTE — Telephone Encounter (Signed)
 Reviewed. Have her hold her amlodipine  for now. Follow pressures.  Send in readings over the next week. Any problems let us  know.

## 2023-05-10 NOTE — Telephone Encounter (Signed)
 Spoke with pt's husband and he stated that he did recheck pt's bp, it was 105/48. He stated that pt is having absolutely no symptoms and is alert, well and acting like her normal self. He was advised to keep a record of her bp readings so they can be discussed at her appt next week.

## 2023-05-10 NOTE — Telephone Encounter (Signed)
 Please call and confirm continuing to feel ok and no other symptoms. Confirm no dizziness, light headedness. Please f/u regarding repeat blood pressure check.

## 2023-05-10 NOTE — Telephone Encounter (Signed)
 Spoke with pt's husband and he gave a verbal understanding.

## 2023-05-12 ENCOUNTER — Telehealth: Payer: Self-pay

## 2023-05-12 NOTE — Telephone Encounter (Signed)
 Patient husband was made aware of Dr Thayne Fine recommendations. Verbalized understanding.

## 2023-05-12 NOTE — Telephone Encounter (Signed)
 Continue off amlodipine .  Continue to monitor blood pressures.  Let us  know if any problems. Keep f/u appt

## 2023-05-12 NOTE — Telephone Encounter (Signed)
 Copied from CRM [id number]. Topic: Clinical - Medical Advice >> May 12, 2023  2:14 PM Taleah C wrote: Reason for CRM: Patient's Husband called to give [name] the Bp readings as per instructions.   "Bp readings w/ No amlodipine "  05/08 @9 :30 am- 119/59 PM: 119/48  05/09 10:29 am: 113/47 2PM: 114/57  He stated that she is not having any sxs. Please follow-up and advise.

## 2023-05-16 ENCOUNTER — Other Ambulatory Visit: Payer: Self-pay

## 2023-05-16 DIAGNOSIS — E1065 Type 1 diabetes mellitus with hyperglycemia: Secondary | ICD-10-CM

## 2023-05-16 DIAGNOSIS — I10 Essential (primary) hypertension: Secondary | ICD-10-CM

## 2023-05-16 DIAGNOSIS — E7800 Pure hypercholesterolemia, unspecified: Secondary | ICD-10-CM

## 2023-05-17 ENCOUNTER — Ambulatory Visit: Payer: Self-pay | Admitting: Internal Medicine

## 2023-05-17 ENCOUNTER — Other Ambulatory Visit (INDEPENDENT_AMBULATORY_CARE_PROVIDER_SITE_OTHER): Payer: PPO

## 2023-05-17 DIAGNOSIS — I10 Essential (primary) hypertension: Secondary | ICD-10-CM

## 2023-05-17 DIAGNOSIS — E1065 Type 1 diabetes mellitus with hyperglycemia: Secondary | ICD-10-CM

## 2023-05-17 DIAGNOSIS — E7800 Pure hypercholesterolemia, unspecified: Secondary | ICD-10-CM | POA: Diagnosis not present

## 2023-05-17 LAB — BASIC METABOLIC PANEL WITH GFR
BUN: 27 mg/dL — ABNORMAL HIGH (ref 6–23)
CO2: 27 meq/L (ref 19–32)
Calcium: 10.5 mg/dL (ref 8.4–10.5)
Chloride: 105 meq/L (ref 96–112)
Creatinine, Ser: 1.12 mg/dL (ref 0.40–1.20)
GFR: 43.39 mL/min — ABNORMAL LOW (ref >60.00)
Glucose, Bld: 220 mg/dL — ABNORMAL HIGH (ref 70–99)
Potassium: 4.1 meq/L (ref 3.5–5.1)
Sodium: 142 meq/L (ref 135–145)

## 2023-05-17 LAB — HEPATIC FUNCTION PANEL
ALT: 19 U/L (ref 0–35)
AST: 16 U/L (ref 0–37)
Albumin: 4.3 g/dL (ref 3.5–5.2)
Alkaline Phosphatase: 66 U/L (ref 39–117)
Bilirubin, Direct: 0.1 mg/dL (ref 0.0–0.3)
Total Bilirubin: 0.6 mg/dL (ref 0.2–1.2)
Total Protein: 6.8 g/dL (ref 6.0–8.3)

## 2023-05-17 LAB — LIPID PANEL
Cholesterol: 100 mg/dL (ref 0–200)
HDL: 40.2 mg/dL (ref >39.00)
LDL Cholesterol: 40 mg/dL (ref 0–99)
NonHDL: 59.44
Total CHOL/HDL Ratio: 2
Triglycerides: 95 mg/dL (ref 0.0–149.0)
VLDL: 19 mg/dL (ref 0.0–40.0)

## 2023-05-17 LAB — HEMOGLOBIN A1C: Hgb A1c MFr Bld: 7.3 % — ABNORMAL HIGH (ref 4.6–6.5)

## 2023-05-19 ENCOUNTER — Encounter: Payer: Self-pay | Admitting: Internal Medicine

## 2023-05-19 ENCOUNTER — Ambulatory Visit (INDEPENDENT_AMBULATORY_CARE_PROVIDER_SITE_OTHER): Payer: PPO | Admitting: Internal Medicine

## 2023-05-19 VITALS — BP 118/60 | HR 81 | Temp 97.4°F | Ht 62.0 in | Wt 174.4 lb

## 2023-05-19 DIAGNOSIS — E7800 Pure hypercholesterolemia, unspecified: Secondary | ICD-10-CM | POA: Diagnosis not present

## 2023-05-19 DIAGNOSIS — E1069 Type 1 diabetes mellitus with other specified complication: Secondary | ICD-10-CM | POA: Diagnosis not present

## 2023-05-19 DIAGNOSIS — I779 Disorder of arteries and arterioles, unspecified: Secondary | ICD-10-CM

## 2023-05-19 DIAGNOSIS — C8599 Non-Hodgkin lymphoma, unspecified, extranodal and solid organ sites: Secondary | ICD-10-CM

## 2023-05-19 DIAGNOSIS — F419 Anxiety disorder, unspecified: Secondary | ICD-10-CM

## 2023-05-19 DIAGNOSIS — E1065 Type 1 diabetes mellitus with hyperglycemia: Secondary | ICD-10-CM | POA: Diagnosis not present

## 2023-05-19 DIAGNOSIS — D649 Anemia, unspecified: Secondary | ICD-10-CM | POA: Diagnosis not present

## 2023-05-19 DIAGNOSIS — E038 Other specified hypothyroidism: Secondary | ICD-10-CM

## 2023-05-19 DIAGNOSIS — N1831 Chronic kidney disease, stage 3a: Secondary | ICD-10-CM

## 2023-05-19 DIAGNOSIS — K219 Gastro-esophageal reflux disease without esophagitis: Secondary | ICD-10-CM

## 2023-05-19 DIAGNOSIS — E785 Hyperlipidemia, unspecified: Secondary | ICD-10-CM | POA: Diagnosis not present

## 2023-05-19 DIAGNOSIS — I10 Essential (primary) hypertension: Secondary | ICD-10-CM

## 2023-05-19 MED ORDER — ALBUTEROL SULFATE HFA 108 (90 BASE) MCG/ACT IN AERS: 1.0000 | INHALATION_SPRAY | Freq: Four times a day (QID) | RESPIRATORY_TRACT | 2 refills | Status: DC | PRN

## 2023-05-19 MED ORDER — ROSUVASTATIN CALCIUM 5 MG PO TABS: ORAL_TABLET | ORAL | 3 refills | Status: DC

## 2023-05-19 MED ORDER — PANTOPRAZOLE SODIUM 40 MG PO TBEC: 40.0000 mg | DELAYED_RELEASE_TABLET | Freq: Two times a day (BID) | ORAL | 1 refills | Status: DC

## 2023-05-19 MED ORDER — BUSPIRONE HCL 5 MG PO TABS: 5.0000 mg | ORAL_TABLET | Freq: Every day | ORAL | 1 refills | Status: DC

## 2023-05-19 MED ORDER — FLUTICASONE PROPIONATE 50 MCG/ACT NA SUSP
NASAL | 3 refills | Status: DC
Start: 2023-05-19 — End: 2023-06-14

## 2023-05-19 MED ORDER — VALSARTAN 160 MG PO TABS: 160.0000 mg | ORAL_TABLET | Freq: Every day | ORAL | 1 refills | Status: DC

## 2023-05-19 NOTE — Patient Instructions (Signed)
 Stop the valsartan /hydrochlorothiazide   Start valsartan  160mg  per day.

## 2023-05-19 NOTE — Progress Notes (Signed)
 Subjective:    Patient ID: Marissa Huff, female    DOB: [birth year], 89 y.o.   MRN: [id number]  Patient here for  Chief Complaint  Patient presents with   Medical Management of Chronic Issues    4 mth f/u    HPI Here for a scheduled follow up. Recently noted to have low blood pressure. Amlodipine  held. Blood pressure remaining low. Outside blood pressure readings reviewed - range 104-120/40-50s. Discussed adjusting medication further. Saw Dr Shelvy Dickens 04/14/23 - recommended take 25 units of lantus  if sugar under 225 and 27 units of above 225. Also given a SS for humalog . Saw Dr Aleen Ammons 04/13/23 - f/u back pain. S/p injection. Discussed referral for spinal cord stimulator. Tramadol  prn. F/u Dr Vonna Guardian 02/21/23 - f/u carotid duplex revealed 1-39% stenosis bilaterally without significant progression. Continue annual f/u.    Past Medical History:  Diagnosis Date   Allergy    Arthritis    Carotid artery disease (HCC)    Carpal tunnel syndrome, bilateral    Chronic kidney disease, stage 2, mildly decreased GFR    Diabetes mellitus without complication (HCC)    Diverticulosis    Gastric lymphoma (HCC)    Gastroparesis    GERD (gastroesophageal reflux disease)    History of kidney stones    Hyperlipidemia    Hypertension associated with diabetes (HCC)    Hypothyroidism    Malignant lymphoma (HCC)    gastric   PUD (peptic ulcer disease)    Skin cancer    Spinal stenosis    lumbar laminectomy   Type 1 diabetes mellitus (HCC)    Urinary incontinence    Past Surgical History:  Procedure Laterality Date   antrectomy and vagotomy  1994   CARPAL TUNNEL RELEASE Left 10/24/2008   CARPAL TUNNEL RELEASE Right 06/19/2009   CATARACT EXTRACTION, BILATERAL Bilateral 2014   COLONOSCOPY  05/24/2007   EYE SURGERY     bilateral cataracts   gastric lymphoma  1996   surgery   KNEE ARTHROPLASTY Left 07/06/2022   Procedure: COMPUTER ASSISTED TOTAL KNEE ARTHROPLASTY;  Surgeon: Arlyne Lame, MD;   Location: [facility];  Service: Orthopedics;  Laterality: Left;   KNEE ARTHROSCOPY Left 12/05/2008   KNEE ARTHROSCOPY Right 06/16/2010   LAPAROSCOPIC CHOLECYSTECTOMY  06/1999   LUMBAR LAMINECTOMY  1999   LUMBAR LAMINECTOMY WITH COFLEX 1 LEVEL N/A 09/15/2015   Procedure: Lumbar one-two Laminectomy with placement of coflex;  Surgeon: Elna Haggis, MD;  Location: [facility];  Service: Neurosurgery;  Laterality: N/A;   PARTIAL HYSTERECTOMY  02/1978   fibroid tumors, ovaries not removed   TONSILLECTOMY AND ADENOIDECTOMY  1982   TRIGGER FINGER RELEASE Right 06/19/2009   Family History  Problem Relation Age of Onset   Stroke Mother    Stroke Father    Hypertension Father    Diabetes Brother    Multiple sclerosis Brother    Breast cancer Daughter 55   Colon cancer Neg Hx    Social History   Socioeconomic History   Marital status: Married    Spouse name: Ethelle Herb   Number of children: 2   Years of education: Not on file   Highest education level: Not on file  Occupational History   Not on file  Tobacco Use   Smoking status: Never   Smokeless tobacco: Never  Vaping Use   Vaping status: Never Used  Substance and Sexual Activity   Alcohol use: No    Alcohol/week: 0.0 standard drinks of alcohol  Drug use: No   Sexual activity: Not Currently    Birth control/protection: Surgical  Other Topics Concern   Not on file  Social History Narrative   married   Social Drivers of Health   Financial Resource Strain: Low Risk  (12/05/2022)   Overall Financial Resource Strain (CARDIA)    Difficulty of Paying Living Expenses: Not hard at all  Food Insecurity: No Food Insecurity (12/05/2022)   Hunger Vital Sign    Worried About Running Out of Food in the Last Year: Never true    Ran Out of Food in the Last Year: Never true  Transportation Needs: No Transportation Needs (12/05/2022)   PRAPARE - Administrator, Civil Service (Medical): No    Lack of Transportation (Non-Medical):  No  Physical Activity: Inactive (12/05/2022)   Exercise Vital Sign    Days of Exercise per Week: 0 days    Minutes of Exercise per Session: 0 min  Stress: Stress Concern Present (12/05/2022)   [employer] of Occupational Health - Occupational Stress Questionnaire    Feeling of Stress : To some extent  Social Connections: Moderately Integrated (12/05/2022)   Social Connection and Isolation Panel [NHANES]    Frequency of Communication with Friends and Family: More than three times a week    Frequency of Social Gatherings with Friends and Family: More than three times a week    Attends Religious Services: More than 4 times per year    Active Member of [employer] or Organizations: No    Attends Banker Meetings: Never    Marital Status: Married     Review of Systems  Constitutional:  Negative for appetite change and unexpected weight change.  HENT:  Negative for congestion and sinus pressure.   Respiratory:  Negative for cough and chest tightness.        Breathing stable.   Cardiovascular:  Negative for chest pain and palpitations.       No increased swelling.   Gastrointestinal:  Negative for abdominal pain, diarrhea, nausea and vomiting.  Genitourinary:  Negative for difficulty urinating and dysuria.  Musculoskeletal:  Positive for back pain. Negative for joint swelling.  Skin:  Negative for color change and rash.  Neurological:  Negative for dizziness and headaches.  Psychiatric/Behavioral:  Negative for agitation and dysphoric mood.        Objective:     BP 118/60 (Cuff Size: Normal)   Pulse 81   Temp (!) 97.4 F (36.3 C) (Oral)   Ht 5\' 2"  (1.575 m)   Wt 174 lb 6 oz (79.1 kg)   SpO2 95%   BMI 31.89 kg/m  Wt Readings from Last 3 Encounters:  05/19/23 174 lb 6 oz (79.1 kg)  02/21/23 184 lb (83.5 kg)  01/19/23 184 lb 12.8 oz (83.8 kg)    Physical Exam Vitals reviewed.  Constitutional:      General: She is not in acute distress.    Appearance: Normal  appearance.  HENT:     Head: Normocephalic and atraumatic.     Right Ear: External ear normal.     Left Ear: External ear normal.     Mouth/Throat:     Pharynx: No oropharyngeal exudate or posterior oropharyngeal erythema.  Eyes:     General: No scleral icterus.       Right eye: No discharge.        Left eye: No discharge.     Conjunctiva/sclera: Conjunctivae normal.  Neck:     Thyroid :  No thyromegaly.  Cardiovascular:     Rate and Rhythm: Normal rate and regular rhythm.  Pulmonary:     Effort: No respiratory distress.     Breath sounds: Normal breath sounds. No wheezing.  Abdominal:     General: Bowel sounds are normal.     Palpations: Abdomen is soft.     Tenderness: There is no abdominal tenderness.  Musculoskeletal:        General: No swelling or tenderness.     Cervical back: Neck supple. No tenderness.  Lymphadenopathy:     Cervical: No cervical adenopathy.  Skin:    Findings: No erythema or rash.  Neurological:     Mental Status: She is alert.  Psychiatric:        Mood and Affect: Mood normal.        Behavior: Behavior normal.         Outpatient Encounter Medications as of 05/19/2023  Medication Sig   acetaminophen  (TYLENOL ) 325 MG tablet Take 1-2 tablets (325-650 mg total) by mouth every 6 (six) hours as needed for mild pain (pain score 1-3 or temp > 100.5).   clobetasol  (TEMOVATE ) 0.05 % external solution Apply 1 Application topically 2 (two) times daily.   Continuous Blood Gluc Receiver (FREESTYLE LIBRE 14 DAY READER) DEVI Use 1 each as needed .  Use to test blood sugar   Continuous Blood Gluc Sensor (FREESTYLE LIBRE 14 DAY SENSOR) MISC Use 1 each every 14 (fourteen) days   dextromethorphan-guaiFENesin  (MUCINEX  DM) 30-600 MG 12hr tablet Take 1 tablet by mouth daily as needed for cough.   fexofenadine (ALLEGRA) 180 MG tablet Take 180 mg by mouth daily.   glucagon  (GLUCAGON  EMERGENCY) 1 MG injection Inject 1 mg into the muscle once as needed.   insulin  aspart  (NOVOLOG  FLEXPEN) 100 UNIT/ML FlexPen Inject 4-13 Units into the skin 3 (three) times daily with meals. Sliding scale   LANTUS  SOLOSTAR 100 UNIT/ML Solostar Pen Inject 20-26 Units into the skin at bedtime. Sliding scale   levothyroxine  (SYNTHROID ) 200 MCG tablet TAKE 1 TABLET (200 MCG TOTAL) BY MOUTH DAILY BEFORE BREAKFAST.   MULTIPLE VITAMINS-MINERALS PO Take 1 tablet by mouth daily.   Olopatadine  HCl (PATADAY ) 0.7 % SOLN Apply 1 drop to eye daily as needed.   OVER THE COUNTER MEDICATION Take 2 capsules by mouth daily. Cerebra Brain Health   OVER THE COUNTER MEDICATION Take 2 capsules by mouth daily as needed. Gluco Switch Blood sugar support   Polyethyl Glycol-Propyl Glycol (SYSTANE OP) Place 1 drop into both eyes at bedtime.   Polyethylene Glycol 3350  (MIRALAX  PO) Take 1 Dose by mouth daily as needed.   traMADol  (ULTRAM ) 50 MG tablet Take 50 mg by mouth as needed.   valsartan  (DIOVAN ) 160 MG tablet Take 1 tablet (160 mg total) by mouth daily.   Wheat Dextrin (BENEFIBER) POWD Take 1 Dose by mouth daily as needed.   [DISCONTINUED] albuterol  (VENTOLIN  HFA) 108 (90 Base) MCG/ACT inhaler TAKE 2 PUFFS BY MOUTH EVERY 6 HOURS AS NEEDED FOR WHEEZE OR SHORTNESS OF BREATH   [DISCONTINUED] fluticasone  (FLONASE ) 50 MCG/ACT nasal spray SPRAY 2 SPRAYS INTO EACH NOSTRIL EVERY DAY   [DISCONTINUED] valsartan -hydrochlorothiazide  (DIOVAN -HCT) 160-12.5 MG tablet TAKE 1 TABLET BY MOUTH EVERY DAY   albuterol  (VENTOLIN  HFA) 108 (90 Base) MCG/ACT inhaler Inhale 1-2 puffs into the lungs every 6 (six) hours as needed for wheezing or shortness of breath.   busPIRone  (BUSPAR ) 5 MG tablet Take 1 tablet (5 mg total) by mouth daily.  fluticasone  (FLONASE ) 50 MCG/ACT nasal spray SPRAY 2 SPRAYS INTO EACH NOSTRIL EVERY DAY   pantoprazole  (PROTONIX ) 40 MG tablet Take 1 tablet (40 mg total) by mouth 2 (two) times daily.   rosuvastatin  (CRESTOR ) 5 MG tablet TAKE 1 TABLET BY MOUTH THREE TIMES A WEEK.   [DISCONTINUED] amLODipine   (NORVASC ) 5 MG tablet TAKE 1 TABLET (5 MG TOTAL) BY MOUTH DAILY. (Patient not taking: Reported on 05/19/2023)   [DISCONTINUED] busPIRone  (BUSPAR ) 5 MG tablet TAKE 1 TABLET (5 MG TOTAL) BY MOUTH DAILY. (Patient not taking: Reported on 05/19/2023)   [DISCONTINUED] pantoprazole  (PROTONIX ) 40 MG tablet TAKE 1 TABLET BY MOUTH TWICE A DAY   [DISCONTINUED] rosuvastatin  (CRESTOR ) 5 MG tablet TAKE 1 TABLET BY MOUTH THREE TIMES A WEEK.   No facility-administered encounter medications on file as of 05/19/2023.     Lab Results  Component Value Date   WBC 5.8 01/17/2023   HGB 12.0 01/17/2023   HCT 36.4 01/17/2023   PLT 229.0 01/17/2023   GLUCOSE 220 (H) 05/17/2023   CHOL 100 05/17/2023   TRIG 95.0 05/17/2023   HDL 40.20 05/17/2023   LDLCALC 40 05/17/2023   ALT 19 05/17/2023   AST 16 05/17/2023   NA 142 05/17/2023   K 4.1 05/17/2023   CL 105 05/17/2023   CREATININE 1.12 05/17/2023   BUN 27 (H) 05/17/2023   CO2 27 05/17/2023   TSH 0.90 02/23/2023   HGBA1C 7.3 (H) 05/17/2023   MICROALBUR 2.3 (H) 07/16/2021    MM 3D SCREENING MAMMOGRAM BILATERAL BREAST Result Date: 10/14/2022 CLINICAL DATA:  Screening. EXAM: DIGITAL SCREENING BILATERAL MAMMOGRAM WITH TOMOSYNTHESIS AND CAD TECHNIQUE: Bilateral screening digital craniocaudal and mediolateral oblique mammograms were obtained. Bilateral screening digital breast tomosynthesis was performed. The images were evaluated with computer-aided detection. COMPARISON:  Previous exam(s). ACR Breast Density Category b: There are scattered areas of fibroglandular density. FINDINGS: There are no findings suspicious for malignancy. IMPRESSION: No mammographic evidence of malignancy. A result letter of this screening mammogram will be mailed directly to the patient. RECOMMENDATION: Screening mammogram in one year. (Code:SM-B-01Y) BI-RADS CATEGORY  1: Negative. Electronically Signed   By: Allena Ito M.D.   On: 10/14/2022 09:42       Assessment & Plan:  Other  specified hypothyroidism Assessment & Plan: Continue synthroid . Follow tsh.   Orders: -     TSH; Future  Primary hypertension Assessment & Plan: Off amlodipine . Blood pressure as outlined.  Currently taking valsartan /hydrochlorothiazide . Change to valsartan  160mg  q day. Follow pressures. Follow metabolic panel.   Orders: -     Basic metabolic panel with GFR; Future  Hypercholesterolemia Assessment & Plan: Continue crestor .  Low cholesterol diet and exercise.  Follow lipid panel and liver function tests.  No changes today in medication.  Lab Results  Component Value Date   CHOL 100 05/17/2023   HDL 40.20 05/17/2023   LDLCALC 40 05/17/2023   TRIG 95.0 05/17/2023   CHOLHDL 2 05/17/2023    Orders: -     Lipid panel; Future -     Hepatic function panel; Future  Type 1 diabetes mellitus with hyperglycemia (HCC) Assessment & Plan: Followed by endocrinology.  Currently on lantus  and uses novolog  SSI as outlined. Discussed diet and exercise. Continue f/u with Dr Shelvy Dickens. Recent A1c discussed. No changes in medication today.  Lab Results  Component Value Date   HGBA1C 7.3 (H) 05/17/2023     Orders: -     Hemoglobin A1c; Future  Anemia, unspecified type Assessment & Plan:  Follow cbc.    Anxiety Assessment & Plan: Currently on buspar . Overall stable. Follow.    Carotid artery disease, unspecified laterality [facility]) Assessment & Plan: F/u 02/2023 - carotid ultrasound - 1-39% bilateral - recommended f/u in one year.    CKD stage 3a, GFR 45-59 ml/min (HCC) Assessment & Plan: Avoid antiinflammatories.  Follow metabolic panel. Recent GFR 44. Follow.    Gastric lymphoma (HCC) Assessment & Plan: History of gastric lymphoma. miralax  has helped bowels.  Was previously referred back to GI for evaluation and question of need for EGD given history.  Saw GI 07/08/21 - ordered UGI with barium swallow.  Pt had previously preferred EGD. Have discussed need for f/u with GI.  Overall  stable. Has wanted to follow.    Gastroesophageal reflux disease, unspecified whether esophagitis present Assessment & Plan: Continue protonix .    Hyperlipidemia due to type 1 diabetes mellitus (HCC) Assessment & Plan: Continue crestor .  Low cholesterol diet and exercise.  Follow lipid panel and liver function tests.  Followed by endocrinology.  Currently on lantus  and uses novolog  SSI.  Sugars as outlined. Discussed diet and exercise. Continue f/u with Dr Shelvy Dickens. No changed in medication today.    Other orders -     busPIRone  HCl; Take 1 tablet (5 mg total) by mouth daily.  Dispense: 90 tablet; Refill: 1 -     Pantoprazole  Sodium; Take 1 tablet (40 mg total) by mouth 2 (two) times daily.  Dispense: 180 tablet; Refill: 1 -     Rosuvastatin  Calcium ; TAKE 1 TABLET BY MOUTH THREE TIMES A WEEK.  Dispense: 39 tablet; Refill: 3 -     Albuterol  Sulfate HFA; Inhale 1-2 puffs into the lungs every 6 (six) hours as needed for wheezing or shortness of breath.  Dispense: 18 each; Refill: 2 -     Fluticasone  Propionate; SPRAY 2 SPRAYS INTO EACH NOSTRIL EVERY DAY  Dispense: 48 mL; Refill: 3 -     Valsartan ; Take 1 tablet (160 mg total) by mouth daily.  Dispense: 90 tablet; Refill: 1     Dellar Fenton, MD

## 2023-05-23 ENCOUNTER — Encounter (INDEPENDENT_AMBULATORY_CARE_PROVIDER_SITE_OTHER): Payer: Self-pay

## 2023-05-28 ENCOUNTER — Encounter: Payer: Self-pay | Admitting: Internal Medicine

## 2023-05-28 NOTE — Assessment & Plan Note (Signed)
 Avoid antiinflammatories.  Follow metabolic panel. Recent GFR 44. Follow.

## 2023-05-28 NOTE — Assessment & Plan Note (Signed)
 Continue crestor .  Low cholesterol diet and exercise.  Follow lipid panel and liver function tests.  Followed by endocrinology.  Currently on lantus  and uses novolog  SSI.  Sugars as outlined. Discussed diet and exercise. Continue f/u with Dr Shelvy Dickens. No changed in medication today.

## 2023-05-28 NOTE — Assessment & Plan Note (Signed)
 Off amlodipine . Blood pressure as outlined.  Currently taking valsartan /hydrochlorothiazide . Change to valsartan  160mg  q day. Follow pressures. Follow metabolic panel.

## 2023-05-28 NOTE — Assessment & Plan Note (Signed)
 History of gastric lymphoma. miralax  has helped bowels.  Was previously referred back to GI for evaluation and question of need for EGD given history.  Saw GI 07/08/21 - ordered UGI with barium swallow.  Pt had previously preferred EGD. Have discussed need for f/u with GI.  Overall stable. Has wanted to follow.

## 2023-05-28 NOTE — Assessment & Plan Note (Signed)
 Follow cbc.

## 2023-05-28 NOTE — Assessment & Plan Note (Signed)
 Continue protonix

## 2023-05-28 NOTE — Assessment & Plan Note (Signed)
 Currently on buspar . Overall stable. Follow.

## 2023-05-28 NOTE — Assessment & Plan Note (Signed)
 Continue synthroid. Follow tsh.

## 2023-05-28 NOTE — Assessment & Plan Note (Signed)
 F/u 02/2023 - carotid ultrasound - 1-39% bilateral - recommended f/u in one year.

## 2023-05-28 NOTE — Assessment & Plan Note (Signed)
 Followed by endocrinology.  Currently on lantus  and uses novolog  SSI as outlined. Discussed diet and exercise. Continue f/u with Dr Shelvy Dickens. Recent A1c discussed. No changes in medication today.  Lab Results  Component Value Date   HGBA1C 7.3 (H) 05/17/2023

## 2023-05-28 NOTE — Assessment & Plan Note (Signed)
 Continue crestor .  Low cholesterol diet and exercise.  Follow lipid panel and liver function tests.  No changes today in medication.  Lab Results  Component Value Date   CHOL 100 05/17/2023   HDL 40.20 05/17/2023   LDLCALC 40 05/17/2023   TRIG 95.0 05/17/2023   CHOLHDL 2 05/17/2023

## 2023-06-03 ENCOUNTER — Emergency Department

## 2023-06-03 ENCOUNTER — Inpatient Hospital Stay
Admission: EM | Admit: 2023-06-03 | Discharge: 2023-06-07 | DRG: 689 | Disposition: A | Discharge status: Hospice | Attending: Student | Admitting: Student

## 2023-06-03 ENCOUNTER — Other Ambulatory Visit: Payer: Self-pay

## 2023-06-03 DIAGNOSIS — Z7189 Other specified counseling: Secondary | ICD-10-CM | POA: Diagnosis not present

## 2023-06-03 DIAGNOSIS — N189 Chronic kidney disease, unspecified: Secondary | ICD-10-CM

## 2023-06-03 DIAGNOSIS — I152 Hypertension secondary to endocrine disorders: Secondary | ICD-10-CM | POA: Diagnosis present

## 2023-06-03 DIAGNOSIS — R531 Weakness: Secondary | ICD-10-CM | POA: Diagnosis not present

## 2023-06-03 DIAGNOSIS — Z833 Family history of diabetes mellitus: Secondary | ICD-10-CM

## 2023-06-03 DIAGNOSIS — D631 Anemia in chronic kidney disease: Secondary | ICD-10-CM | POA: Diagnosis present

## 2023-06-03 DIAGNOSIS — B9620 Unspecified Escherichia coli [E. coli] as the cause of diseases classified elsewhere: Secondary | ICD-10-CM | POA: Diagnosis present

## 2023-06-03 DIAGNOSIS — Z96652 Presence of left artificial knee joint: Secondary | ICD-10-CM | POA: Diagnosis present

## 2023-06-03 DIAGNOSIS — E1059 Type 1 diabetes mellitus with other circulatory complications: Secondary | ICD-10-CM | POA: Diagnosis present

## 2023-06-03 DIAGNOSIS — N179 Acute kidney failure, unspecified: Secondary | ICD-10-CM | POA: Diagnosis not present

## 2023-06-03 DIAGNOSIS — Z8711 Personal history of peptic ulcer disease: Secondary | ICD-10-CM

## 2023-06-03 DIAGNOSIS — Z794 Long term (current) use of insulin: Secondary | ICD-10-CM

## 2023-06-03 DIAGNOSIS — F0150 Vascular dementia without behavioral disturbance: Secondary | ICD-10-CM | POA: Diagnosis present

## 2023-06-03 DIAGNOSIS — D649 Anemia, unspecified: Secondary | ICD-10-CM | POA: Diagnosis not present

## 2023-06-03 DIAGNOSIS — R4182 Altered mental status, unspecified: Secondary | ICD-10-CM | POA: Diagnosis present

## 2023-06-03 DIAGNOSIS — E860 Dehydration: Secondary | ICD-10-CM | POA: Diagnosis not present

## 2023-06-03 DIAGNOSIS — Z79890 Hormone replacement therapy (postmenopausal): Secondary | ICD-10-CM

## 2023-06-03 DIAGNOSIS — Z136 Encounter for screening for cardiovascular disorders: Secondary | ICD-10-CM | POA: Diagnosis not present

## 2023-06-03 DIAGNOSIS — Z880 Allergy status to penicillin: Secondary | ICD-10-CM

## 2023-06-03 DIAGNOSIS — Z803 Family history of malignant neoplasm of breast: Secondary | ICD-10-CM

## 2023-06-03 DIAGNOSIS — R410 Disorientation, unspecified: Secondary | ICD-10-CM | POA: Diagnosis not present

## 2023-06-03 DIAGNOSIS — Z9049 Acquired absence of other specified parts of digestive tract: Secondary | ICD-10-CM

## 2023-06-03 DIAGNOSIS — R630 Anorexia: Secondary | ICD-10-CM | POA: Diagnosis present

## 2023-06-03 DIAGNOSIS — G8929 Other chronic pain: Secondary | ICD-10-CM | POA: Diagnosis present

## 2023-06-03 DIAGNOSIS — Z8572 Personal history of non-Hodgkin lymphomas: Secondary | ICD-10-CM

## 2023-06-03 DIAGNOSIS — Z85828 Personal history of other malignant neoplasm of skin: Secondary | ICD-10-CM

## 2023-06-03 DIAGNOSIS — C801 Malignant (primary) neoplasm, unspecified: Secondary | ICD-10-CM | POA: Diagnosis not present

## 2023-06-03 DIAGNOSIS — H9190 Unspecified hearing loss, unspecified ear: Secondary | ICD-10-CM | POA: Diagnosis present

## 2023-06-03 DIAGNOSIS — Z87442 Personal history of urinary calculi: Secondary | ICD-10-CM

## 2023-06-03 DIAGNOSIS — K3184 Gastroparesis: Secondary | ICD-10-CM | POA: Diagnosis present

## 2023-06-03 DIAGNOSIS — M1990 Unspecified osteoarthritis, unspecified site: Secondary | ICD-10-CM | POA: Diagnosis not present

## 2023-06-03 DIAGNOSIS — C7951 Secondary malignant neoplasm of bone: Secondary | ICD-10-CM | POA: Diagnosis not present

## 2023-06-03 DIAGNOSIS — Z9841 Cataract extraction status, right eye: Secondary | ICD-10-CM

## 2023-06-03 DIAGNOSIS — I959 Hypotension, unspecified: Secondary | ICD-10-CM | POA: Diagnosis not present

## 2023-06-03 DIAGNOSIS — I4891 Unspecified atrial fibrillation: Secondary | ICD-10-CM | POA: Diagnosis not present

## 2023-06-03 DIAGNOSIS — E1022 Type 1 diabetes mellitus with diabetic chronic kidney disease: Secondary | ICD-10-CM | POA: Insufficient documentation

## 2023-06-03 DIAGNOSIS — N390 Urinary tract infection, site not specified: Principal | ICD-10-CM

## 2023-06-03 DIAGNOSIS — E0590 Thyrotoxicosis, unspecified without thyrotoxic crisis or storm: Secondary | ICD-10-CM | POA: Diagnosis present

## 2023-06-03 DIAGNOSIS — G319 Degenerative disease of nervous system, unspecified: Secondary | ICD-10-CM | POA: Diagnosis not present

## 2023-06-03 DIAGNOSIS — N1831 Chronic kidney disease, stage 3a: Secondary | ICD-10-CM | POA: Diagnosis present

## 2023-06-03 DIAGNOSIS — Z515 Encounter for palliative care: Secondary | ICD-10-CM | POA: Diagnosis not present

## 2023-06-03 DIAGNOSIS — G9341 Metabolic encephalopathy: Secondary | ICD-10-CM | POA: Diagnosis present

## 2023-06-03 DIAGNOSIS — Z66 Do not resuscitate: Secondary | ICD-10-CM | POA: Diagnosis not present

## 2023-06-03 DIAGNOSIS — Z9842 Cataract extraction status, left eye: Secondary | ICD-10-CM

## 2023-06-03 DIAGNOSIS — Z90711 Acquired absence of uterus with remaining cervical stump: Secondary | ICD-10-CM

## 2023-06-03 DIAGNOSIS — E785 Hyperlipidemia, unspecified: Secondary | ICD-10-CM | POA: Diagnosis not present

## 2023-06-03 DIAGNOSIS — Z820 Family history of epilepsy and other diseases of the nervous system: Secondary | ICD-10-CM

## 2023-06-03 DIAGNOSIS — E1043 Type 1 diabetes mellitus with diabetic autonomic (poly)neuropathy: Secondary | ICD-10-CM | POA: Diagnosis not present

## 2023-06-03 DIAGNOSIS — Z79899 Other long term (current) drug therapy: Secondary | ICD-10-CM

## 2023-06-03 DIAGNOSIS — Z823 Family history of stroke: Secondary | ICD-10-CM

## 2023-06-03 DIAGNOSIS — I499 Cardiac arrhythmia, unspecified: Secondary | ICD-10-CM | POA: Diagnosis not present

## 2023-06-03 DIAGNOSIS — Z8249 Family history of ischemic heart disease and other diseases of the circulatory system: Secondary | ICD-10-CM

## 2023-06-03 DIAGNOSIS — E039 Hypothyroidism, unspecified: Secondary | ICD-10-CM | POA: Diagnosis not present

## 2023-06-03 LAB — CBC WITH DIFFERENTIAL/PLATELET
Abs Immature Granulocytes: 0.07 10*3/uL (ref 0.00–0.07)
Basophils Absolute: 0 10*3/uL (ref 0.0–0.1)
Basophils Relative: 1 %
Eosinophils Absolute: 0.2 10*3/uL (ref 0.0–0.5)
Eosinophils Relative: 3 %
HCT: 22.8 % — ABNORMAL LOW (ref 36.0–46.0)
Hemoglobin: 7.3 g/dL — ABNORMAL LOW (ref 12.0–15.0)
Immature Granulocytes: 1 %
Lymphocytes Relative: 15 %
Lymphs Abs: 0.9 10*3/uL (ref 0.7–4.0)
MCH: 31.2 pg (ref 26.0–34.0)
MCHC: 32 g/dL (ref 30.0–36.0)
MCV: 97.4 fL (ref 80.0–100.0)
Monocytes Absolute: 0.7 10*3/uL (ref 0.1–1.0)
Monocytes Relative: 11 %
Neutro Abs: 4.1 10*3/uL (ref 1.7–7.7)
Neutrophils Relative %: 69 %
Platelets: 181 10*3/uL (ref 150–400)
RBC: 2.34 MIL/uL — ABNORMAL LOW (ref 3.87–5.11)
RDW: 17 % — ABNORMAL HIGH (ref 11.5–15.5)
WBC: 5.9 10*3/uL (ref 4.0–10.5)
nRBC: 0 % (ref 0.0–0.2)

## 2023-06-03 LAB — URINALYSIS, ROUTINE W REFLEX MICROSCOPIC
Bilirubin Urine: NEGATIVE
Glucose, UA: NEGATIVE mg/dL
Hgb urine dipstick: NEGATIVE
Ketones, ur: NEGATIVE mg/dL
Nitrite: NEGATIVE
Protein, ur: 30 mg/dL — AB
Specific Gravity, Urine: 1.013 (ref 1.005–1.030)
WBC, UA: >50 WBC/hpf (ref 0–5)
pH: 5 (ref 5.0–8.0)

## 2023-06-03 LAB — BASIC METABOLIC PANEL WITH GFR
Anion gap: 11 (ref 5–15)
BUN: 55 mg/dL — ABNORMAL HIGH (ref 8–23)
CO2: 22 mmol/L (ref 22–32)
Calcium: 13 mg/dL — ABNORMAL HIGH (ref 8.9–10.3)
Chloride: 103 mmol/L (ref 98–111)
Creatinine, Ser: 1.98 mg/dL — ABNORMAL HIGH (ref 0.44–1.00)
GFR, Estimated: 24 mL/min — ABNORMAL LOW (ref >60)
Glucose, Bld: 201 mg/dL — ABNORMAL HIGH (ref 70–99)
Potassium: 4.3 mmol/L (ref 3.5–5.1)
Sodium: 136 mmol/L (ref 135–145)

## 2023-06-03 LAB — TROPONIN I (HIGH SENSITIVITY): Troponin I (High Sensitivity): 41 ng/L — ABNORMAL HIGH (ref <18)

## 2023-06-03 MED ORDER — SODIUM CHLORIDE 0.9 % IV SOLN
1.0000 g | Freq: Once | INTRAVENOUS | Status: AC
  Administered 2023-06-03: 1 g via INTRAVENOUS
  Filled 2023-06-03: qty 10

## 2023-06-03 NOTE — ED Notes (Signed)
 Pt cleaned, in and out cath done, purwick placed, labs collected

## 2023-06-03 NOTE — ED Provider Notes (Signed)
 [facility] Provider Note    Event Date/Time   First MD Initiated Contact with Patient 06/03/23 2218     (approximate)   History   Weakness and Altered Mental Status   HPI  Marissa Huff is a 89 y.o. female who presents to the emergency department today via EMS because of concerns for weakness.  The patient states that the weakness started about a week ago.  She lives with her husband who has been working on caring for but apparently is having to do more than normal.  No reported fevers.  Patient denies any headache or chest pain.  Denies any burning with urination.     Physical Exam   Triage Vital Signs: ED Triage Vitals  Encounter Vitals Group     BP 06/03/23 2226 117/75     Systolic BP Percentile --      Diastolic BP Percentile --      Pulse Rate 06/03/23 2222 89     Resp 06/03/23 2222 20     Temp 06/03/23 2228 97.9 F (36.6 C)     Temp Source 06/03/23 2228 Oral     SpO2 06/03/23 2222 96 %     Weight --      Height --      Head Circumference --      Peak Flow --      Pain Score --      Pain Loc --      Pain Education --      Exclude from Growth Chart --     Most recent vital signs: Vitals:   06/03/23 2226 06/03/23 2228  BP: 117/75   Pulse:    Resp:    Temp:  97.9 F (36.6 C)  SpO2:     General: Awake, alert, not completely oriented. CV:  Good peripheral perfusion. Regular rate and rhythm. Resp:  Normal effort. Lungs clear. Abd:  No distention.    ED Results / Procedures / Treatments   Labs (all labs ordered are listed, but only abnormal results are displayed) Labs Reviewed  CBC WITH DIFFERENTIAL/PLATELET - Abnormal; Notable for the following components:      Result Value   RBC 2.34 (*)    Hemoglobin 7.3 (*)    HCT 22.8 (*)    RDW 17.0 (*)    All other components within normal limits  BASIC METABOLIC PANEL WITH GFR - Abnormal; Notable for the following components:   Glucose, Bld 201 (*)    BUN 55 (*)     Creatinine, Ser 1.98 (*)    Calcium  13.0 (*)    GFR, Estimated 24 (*)    All other components within normal limits  URINALYSIS, ROUTINE W REFLEX MICROSCOPIC - Abnormal; Notable for the following components:   Color, Urine YELLOW (*)    APPearance CLOUDY (*)    Protein, ur 30 (*)    Leukocytes,Ua MODERATE (*)    Bacteria, UA MANY (*)    All other components within normal limits  BASIC METABOLIC PANEL WITH GFR - Abnormal; Notable for the following components:   Glucose, Bld 185 (*)    BUN 53 (*)    Creatinine, Ser 1.80 (*)    Calcium  12.7 (*)    GFR, Estimated 26 (*)    All other components within normal limits  CBC - Abnormal; Notable for the following components:   RBC 2.25 (*)    Hemoglobin 7.2 (*)    HCT 21.7 (*)  RDW 17.2 (*)    All other components within normal limits  VITAMIN B12 - Abnormal; Notable for the following components:   Vitamin B-12 4,204 (*)    All other components within normal limits  RETICULOCYTES - Abnormal; Notable for the following components:   RBC. 2.28 (*)    All other components within normal limits  TSH - Abnormal; Notable for the following components:   TSH 0.085 (*)    All other components within normal limits  T4, FREE - Abnormal; Notable for the following components:   Free T4 4.59 (*)    All other components within normal limits  TROPONIN I (HIGH SENSITIVITY) - Abnormal; Notable for the following components:   Troponin I (High Sensitivity) 41 (*)    All other components within normal limits  TROPONIN I (HIGH SENSITIVITY) - Abnormal; Notable for the following components:   Troponin I (High Sensitivity) 35 (*)    All other components within normal limits  URINE CULTURE  C DIFFICILE QUICK SCREEN W PCR REFLEX    GASTROINTESTINAL PANEL BY PCR, STOOL (REPLACES STOOL CULTURE)  IRON AND TIBC  FERRITIN  LACTATE DEHYDROGENASE  FOLATE  PHOSPHORUS  VITAMIN D 25 HYDROXY (VIT D DEFICIENCY, FRACTURES)  HAPTOGLOBIN  OCCULT BLOOD X 1 CARD TO LAB,  STOOL  CALCIUM , IONIZED  PARATHYROID HORMONE, INTACT (NO CA)     EKG  I, Marylynn Soho, attending physician, personally viewed and interpreted this EKG  EKG Time: 2224 Rate: 90 Rhythm: sinus rhythm Axis: normal Intervals: qtc 446 QRS: narrow, q waves v1, v2, v3 ST changes: no st elevation Impression: abnormal ekg   RADIOLOGY I independently interpreted and visualized the CXR. My interpretation: No pneumonia Radiology interpretation:  IMPRESSION:  1. No acute process.      PROCEDURES:  Critical Care performed: No    MEDICATIONS ORDERED IN ED: Medications - No data to display   IMPRESSION / MDM / ASSESSMENT AND PLAN / ED COURSE  I reviewed the triage vital signs and the nursing notes.                              Differential diagnosis includes, but is not limited to, UTI, dementia, pneumonia, anemia, electrolyte abnormality  Patient's presentation is most consistent with acute presentation with potential threat to life or bodily function.   The patient is on the cardiac monitor to evaluate for evidence of arrhythmia and/or significant heart rate changes.  Patient presented to the emergency department today because of concerns for 1 week history of increased weakness.  On exam patient is not completely oriented.  Patient was afebrile here in the emergency department.  Blood work without leukocytosis.  Does show fairly significant anemia.  Guaiac was negative.  UA was concerning for infection.  Will start IV antibiotics.  Discussed with Dr. Achilles Holes with the hospitalist service who will evaluate for admission.      FINAL CLINICAL IMPRESSION(S) / ED DIAGNOSES   Final diagnoses:  Lower urinary tract infectious disease        Rx / DC Orders     Note:  This document was prepared using Dragon voice recognition software and may include unintentional dictation errors.    Marylynn Soho, MD 06/04/23 [phone]

## 2023-06-03 NOTE — ED Triage Notes (Signed)
 BIB ems from home for weakness and altered mental status has been going on for a week and has just been getting worse per husband  Per ems "husband thinks it is dementia but unsure"

## 2023-06-04 ENCOUNTER — Encounter: Payer: Self-pay | Admitting: Family Medicine

## 2023-06-04 DIAGNOSIS — N390 Urinary tract infection, site not specified: Secondary | ICD-10-CM

## 2023-06-04 DIAGNOSIS — D649 Anemia, unspecified: Secondary | ICD-10-CM

## 2023-06-04 DIAGNOSIS — N179 Acute kidney failure, unspecified: Secondary | ICD-10-CM

## 2023-06-04 DIAGNOSIS — R531 Weakness: Secondary | ICD-10-CM | POA: Diagnosis not present

## 2023-06-04 DIAGNOSIS — E1022 Type 1 diabetes mellitus with diabetic chronic kidney disease: Secondary | ICD-10-CM | POA: Insufficient documentation

## 2023-06-04 LAB — CBC
HCT: 21.7 % — ABNORMAL LOW (ref 36.0–46.0)
Hemoglobin: 7.2 g/dL — ABNORMAL LOW (ref 12.0–15.0)
MCH: 32 pg (ref 26.0–34.0)
MCHC: 33.2 g/dL (ref 30.0–36.0)
MCV: 96.4 fL (ref 80.0–100.0)
Platelets: 164 10*3/uL (ref 150–400)
RBC: 2.25 MIL/uL — ABNORMAL LOW (ref 3.87–5.11)
RDW: 17.2 % — ABNORMAL HIGH (ref 11.5–15.5)
WBC: 5.6 10*3/uL (ref 4.0–10.5)
nRBC: 0 % (ref 0.0–0.2)

## 2023-06-04 LAB — GASTROINTESTINAL PANEL BY PCR, STOOL (REPLACES STOOL CULTURE)

## 2023-06-04 LAB — BASIC METABOLIC PANEL WITH GFR
Anion gap: 9 (ref 5–15)
BUN: 53 mg/dL — ABNORMAL HIGH (ref 8–23)
CO2: 24 mmol/L (ref 22–32)
Calcium: 12.7 mg/dL — ABNORMAL HIGH (ref 8.9–10.3)
Chloride: 104 mmol/L (ref 98–111)
Creatinine, Ser: 1.8 mg/dL — ABNORMAL HIGH (ref 0.44–1.00)
GFR, Estimated: 26 mL/min — ABNORMAL LOW (ref >60)
Glucose, Bld: 185 mg/dL — ABNORMAL HIGH (ref 70–99)
Potassium: 4 mmol/L (ref 3.5–5.1)
Sodium: 137 mmol/L (ref 135–145)

## 2023-06-04 LAB — GLUCOSE, CAPILLARY
Glucose-Capillary: 174 mg/dL — ABNORMAL HIGH (ref 70–99)
Glucose-Capillary: 143 mg/dL — ABNORMAL HIGH (ref 70–99)

## 2023-06-04 LAB — FERRITIN: Ferritin: 137 ng/mL (ref 11–307)

## 2023-06-04 LAB — IRON AND TIBC
Iron: 42 ug/dL (ref 28–170)
Saturation Ratios: 13 % (ref 10.4–31.8)
TIBC: 318 ug/dL (ref 250–450)
UIBC: 276 ug/dL

## 2023-06-04 LAB — VITAMIN D 25 HYDROXY (VIT D DEFICIENCY, FRACTURES): Vit D, 25-Hydroxy: 33.56 ng/mL (ref 30–100)

## 2023-06-04 LAB — RETICULOCYTES
Immature Retic Fract: 14.6 % (ref 2.3–15.9)
RBC.: 2.28 MIL/uL — ABNORMAL LOW (ref 3.87–5.11)
Retic Count, Absolute: 44 10*3/uL (ref 19.0–186.0)
Retic Ct Pct: 1.9 % (ref 0.4–3.1)

## 2023-06-04 LAB — T4, FREE: Free T4: 4.59 ng/dL — ABNORMAL HIGH (ref 0.61–1.12)

## 2023-06-04 LAB — PHOSPHORUS: Phosphorus: 4.4 mg/dL (ref 2.5–4.6)

## 2023-06-04 LAB — C DIFFICILE QUICK SCREEN W PCR REFLEX
C Diff antigen: NEGATIVE
C Diff interpretation: NOT DETECTED
C Diff toxin: NEGATIVE

## 2023-06-04 LAB — VITAMIN B12: Vitamin B-12: 4204 pg/mL — ABNORMAL HIGH (ref 180–914)

## 2023-06-04 LAB — TROPONIN I (HIGH SENSITIVITY): Troponin I (High Sensitivity): 35 ng/L — ABNORMAL HIGH (ref <18)

## 2023-06-04 LAB — FOLATE: Folate: 14.2 ng/mL (ref >5.9)

## 2023-06-04 LAB — OCCULT BLOOD X 1 CARD TO LAB, STOOL: Fecal Occult Bld: NEGATIVE

## 2023-06-04 LAB — LACTATE DEHYDROGENASE: LDH: 103 U/L (ref 98–192)

## 2023-06-04 LAB — TSH: TSH: 0.085 u[IU]/mL — ABNORMAL LOW (ref 0.350–4.500)

## 2023-06-04 MED ORDER — TRAZODONE HCL 50 MG PO TABS
25.0000 mg | ORAL_TABLET | Freq: Every evening | ORAL | Status: DC | PRN
Start: 2023-06-04 — End: 2023-06-05
  Administered 2023-06-04: 25 mg via ORAL
  Filled 2023-06-04 (×2): qty 1

## 2023-06-04 MED ORDER — FLUTICASONE PROPIONATE 50 MCG/ACT NA SUSP
2.0000 | Freq: Every day | NASAL | Status: DC
  Filled 2023-06-04 (×2): qty 16

## 2023-06-04 MED ORDER — CLOBETASOL PROPIONATE 0.05 % EX SOLN: 1.0000 | Freq: Two times a day (BID) | CUTANEOUS | Status: DC

## 2023-06-04 MED ORDER — ONDANSETRON HCL 4 MG/2ML IJ SOLN
4.0000 mg | Freq: Four times a day (QID) | INTRAMUSCULAR | Status: DC | PRN
Start: 2023-06-04 — End: 2023-06-07

## 2023-06-04 MED ORDER — LORATADINE 10 MG PO TABS
10.0000 mg | ORAL_TABLET | Freq: Every day | ORAL | Status: DC
  Administered 2023-06-04: 10 mg via ORAL
  Filled 2023-06-04 (×2): qty 1

## 2023-06-04 MED ORDER — MAGNESIUM HYDROXIDE 400 MG/5ML PO SUSP: 30.0000 mL | Freq: Every day | ORAL | Status: DC | PRN

## 2023-06-04 MED ORDER — INSULIN ASPART 100 UNIT/ML IJ SOLN
0.0000 [IU] | Freq: Three times a day (TID) | INTRAMUSCULAR | Status: DC
  Administered 2023-06-04: 2 [IU] via SUBCUTANEOUS
  Administered 2023-06-04: 5 [IU] via SUBCUTANEOUS
  Administered 2023-06-05: 1 [IU] via SUBCUTANEOUS
  Filled 2023-06-04 (×3): qty 1

## 2023-06-04 MED ORDER — DM-GUAIFENESIN ER 30-600 MG PO TB12: 1.0000 | ORAL_TABLET | Freq: Every day | ORAL | Status: DC | PRN

## 2023-06-04 MED ORDER — ADULT MULTIVITAMIN W/MINERALS CH
1.0000 | ORAL_TABLET | Freq: Every day | ORAL | Status: DC
  Administered 2023-06-04: 1 via ORAL
  Filled 2023-06-04 (×2): qty 1

## 2023-06-04 MED ORDER — SODIUM CHLORIDE 0.9 % IV SOLN: INTRAVENOUS | Status: DC

## 2023-06-04 MED ORDER — OLOPATADINE HCL 0.7 % OP SOLN: 1.0000 [drp] | Freq: Every day | OPHTHALMIC | Status: DC | PRN

## 2023-06-04 MED ORDER — ROSUVASTATIN CALCIUM 10 MG PO TABS
5.0000 mg | ORAL_TABLET | ORAL | Status: DC
  Administered 2023-06-05: 5 mg via ORAL
  Filled 2023-06-04: qty 1

## 2023-06-04 MED ORDER — ACETAMINOPHEN 650 MG RE SUPP: 650.0000 mg | Freq: Four times a day (QID) | RECTAL | Status: DC | PRN

## 2023-06-04 MED ORDER — ALBUTEROL SULFATE (2.5 MG/3ML) 0.083% IN NEBU: 2.5000 mg | INHALATION_SOLUTION | Freq: Four times a day (QID) | RESPIRATORY_TRACT | Status: DC | PRN

## 2023-06-04 MED ORDER — LEVOTHYROXINE SODIUM 100 MCG PO TABS
200.0000 ug | ORAL_TABLET | Freq: Every day | ORAL | Status: DC
  Administered 2023-06-04 – 2023-06-05 (×2): 200 ug via ORAL
  Filled 2023-06-04 (×2): qty 2

## 2023-06-04 MED ORDER — ENOXAPARIN SODIUM 30 MG/0.3ML IJ SOSY
30.0000 mg | PREFILLED_SYRINGE | INTRAMUSCULAR | Status: DC
  Administered 2023-06-04 – 2023-06-06 (×3): 30 mg via SUBCUTANEOUS
  Filled 2023-06-04 (×3): qty 0.3

## 2023-06-04 MED ORDER — SODIUM CHLORIDE 0.9 % IV SOLN: 1.0000 g | INTRAVENOUS | Status: DC

## 2023-06-04 MED ORDER — OXYCODONE HCL 5 MG PO TABS: 5.0000 mg | ORAL_TABLET | Freq: Four times a day (QID) | ORAL | Status: DC | PRN

## 2023-06-04 MED ORDER — PANTOPRAZOLE SODIUM 40 MG PO TBEC
40.0000 mg | DELAYED_RELEASE_TABLET | Freq: Two times a day (BID) | ORAL | Status: DC
  Administered 2023-06-04: 40 mg via ORAL
  Filled 2023-06-04 (×3): qty 1

## 2023-06-04 MED ORDER — IRBESARTAN 150 MG PO TABS: 150.0000 mg | ORAL_TABLET | Freq: Every day | ORAL | Status: DC

## 2023-06-04 MED ORDER — INSULIN GLARGINE-YFGN 100 UNIT/ML ~~LOC~~ SOLN
20.0000 [IU] | Freq: Every day | SUBCUTANEOUS | Status: DC
  Administered 2023-06-04: 20 [IU] via SUBCUTANEOUS
  Filled 2023-06-04: qty 0.2

## 2023-06-04 MED ORDER — ACETAMINOPHEN 325 MG PO TABS
650.0000 mg | ORAL_TABLET | Freq: Four times a day (QID) | ORAL | Status: DC | PRN
  Administered 2023-06-05: 650 mg via ORAL
  Filled 2023-06-04 (×2): qty 2

## 2023-06-04 MED ORDER — TRAMADOL HCL 50 MG PO TABS
50.0000 mg | ORAL_TABLET | Freq: Three times a day (TID) | ORAL | Status: DC
  Administered 2023-06-04 (×2): 50 mg via ORAL
  Filled 2023-06-04 (×4): qty 1

## 2023-06-04 MED ORDER — TRAMADOL HCL 50 MG PO TABS
50.0000 mg | ORAL_TABLET | Freq: Two times a day (BID) | ORAL | Status: DC | PRN
  Administered 2023-06-04: 50 mg via ORAL
  Filled 2023-06-04: qty 1

## 2023-06-04 MED ORDER — INSULIN ASPART 100 UNIT/ML IJ SOLN: 0.0000 [IU] | Freq: Every day | INTRAMUSCULAR | Status: DC

## 2023-06-04 MED ORDER — ONDANSETRON HCL 4 MG PO TABS: 4.0000 mg | ORAL_TABLET | Freq: Four times a day (QID) | ORAL | Status: DC | PRN

## 2023-06-04 MED ORDER — BUSPIRONE HCL 10 MG PO TABS
5.0000 mg | ORAL_TABLET | Freq: Every day | ORAL | Status: DC
  Administered 2023-06-04: 5 mg via ORAL
  Filled 2023-06-04 (×2): qty 1

## 2023-06-04 MED ORDER — SODIUM CHLORIDE 0.9 % IV SOLN
1.0000 g | INTRAVENOUS | Status: DC
  Administered 2023-06-04 – 2023-06-06 (×3): 1 g via INTRAVENOUS
  Filled 2023-06-04 (×3): qty 10

## 2023-06-04 MED ORDER — ALBUTEROL SULFATE HFA 108 (90 BASE) MCG/ACT IN AERS: 1.0000 | INHALATION_SPRAY | Freq: Four times a day (QID) | RESPIRATORY_TRACT | Status: DC | PRN

## 2023-06-04 MED ORDER — POLYVINYL ALCOHOL 1.4 % OP SOLN
1.0000 [drp] | Freq: Every day | OPHTHALMIC | Status: DC
  Administered 2023-06-04 – 2023-06-06 (×3): 1 [drp] via OPHTHALMIC
  Filled 2023-06-04: qty 15

## 2023-06-04 MED ORDER — INSULIN ASPART 100 UNIT/ML IJ SOLN
4.0000 [IU] | Freq: Three times a day (TID) | INTRAMUSCULAR | Status: DC
  Administered 2023-06-04 (×2): 4 [IU] via SUBCUTANEOUS
  Filled 2023-06-04 (×2): qty 1

## 2023-06-04 MED ORDER — IRBESARTAN 150 MG PO TABS
75.0000 mg | ORAL_TABLET | Freq: Every day | ORAL | Status: DC
  Filled 2023-06-04: qty 1

## 2023-06-04 NOTE — Hospital Course (Addendum)
 Hospital course / significant events:   HPI: Marissa Huff is a 89 y.o. Caucasian female with medical history significant for carotid artery stenosis, osteoarthritis, diverticulosis, GERD, dyslipidemia, hypertension, hypothyroidism, PUD and type 1 diabetes mellitus, presented to the emergency room with acute onset of generalized weakness for the last few days with associated urinary frequency and urgency as well as altered mental status.   05/31: to ED from home. VSS. BUN of 55 and creatinine 1.98 above previous values and calcium  was 13 with blood glucose of 201. UA was positive for UTI. Started on Rocephin. Admitted to hospitalist service.  06/01: Cr 1.8. Working up hypercalcemia, anemia. Await UCx. 06/02: more lethargic today. UCx (+)Ecoli pending susceptibilities. Hgb stable still low, giving 1 unit PRBC. MRI brain pending, low suspicion for CVA but unable to perform adequate neuro exam d/t patient unable to participate. If CVA, would affect goals/options going forward, so will eval for this or other cause w/ MRI. D/w family re: nutrition, holding feeding tube for now as pt would certainly pull out NG. If not improving despite tx UTI and anemia, and if no other cause found, will assume progressive dementia and likely will need to involve palliative care. PTH pending.  06/03: MRI brain shows osseous findings concerning for  cancer / bony mets / MM, also findings microvascular ischemic disease, c/w vascular dementia picture. Pt still not alert and not eating. Onc and palliative consult. Long discussion w/ husband at bedside - would not want feeding tube, would appreciate cancer workup for confirmation / prognostication. New afib. Confirmed DNR status     Consultants:  Oncology Palliative Care   Procedures/Surgeries: none      ASSESSMENT & PLAN:   Generalized weakness Metabolic encephalopathy d/t UTI, electrolyte derangement, anemia Complicated by underlying dementia - per husband  worsening past few months  Secondary to UTI and possibly symptomatic anemia. B12 normal/high MRI brain pending, low suspicion for CVA but unable to perform adequate neuro exam d/t patient unable to participate. If CVA, would affect goals/options going forward, so will eval for this or other cause w/ MRI.   Manage underlying cause(s) as below    Acute lower UTI UCx (+)Ecoli   IV Rocephin   Symptomatic anemia Hgb on admission 7.3, was 12.0 four mos ago Hgb stable still low, giving 1 unit PRBC. stool occult blood. Ferritin, Iron / Retic normal Normal haptoglobin and LDH, hemolysis unlikely Occult blood stool negative follow H&H Transfused 1 unit PRBC 06/02  HemOnc consult   Acute kidney injury superimposed on chronic kidney disease 3a - improving Baseline Cr 0.7-1.1, GFR 43-51 likely prerenal due to dehydration and volume depletion. IV normal saline to continue   follow BMP   Hypercalcemia - suspect malignancy w/ bone involvement Ionized Ca high 7.1 Normal Phos and VitD PTH low  (+)MRI bone loss on MRI brain HemOnc consult CT Chest/Abd/Pelv eval for mass  Monitor BMP  Vascular dementia Anorexia likely d/t dementia Based on history, MRI brain findings D/w husband - pt has had increasing confusion and declining appetite prior to admission If not becoming more alert then this would push more toward hospice vs treatment other conditions Husband and family would not want feeding tube Palliative consult  Tachycardia - new on set Afib Labetalol IV Resume telemetry  Per family wishes, agree will not anticoagulate at this time given anemia If requiring continuous infusion for rate control will transfer to cardiac unit but family prefers to stay same room for now to minimize disturbance /  discomfort   Type 1 diabetes mellitus  Basal insulin  + SSI   Hx Hypothyroidism  Hyperthyroid labs here w/ low TSH 0.085  and slightly high free T4 4.59 Hold synthroid    Chronic back  pain Tramadol  scheduled  Oxycodone  prn   Diarrhea - resolved Cdiff and GI PCR neg Imodium prn  Goals of care discussion Aspiration risk d/t encephalopathy Anorexia d/t vascular dementia  Likely cancer new diagnosis  mental status is not improving despite tx UTI - assume progressive dementia is underlying irreversible cause Long discussion w/ husband at bedside - would not want feeding tube, would appreciate cancer workup for confirmation / prognostication.  Husband is open to consideration for hospice  Husband would like to consider all options / all information available but is leaning toward avoid aggressive measures  DNR status confirmed w/ husband and daughter    Class 1 obesity based on BMI: Body mass index is 31.9 kg/m.Aaron Aas Significantly low or high BMI is associated with higher medical risk.  Underweight - under 18  overweight - 25 to 29 obese - 30 or more Class 1 obesity: BMI of 30.0 to 34 Class 2 obesity: BMI of 35.0 to 39 Class 3 obesity: BMI of 40.0 to 49 Super Morbid Obesity: BMI 50-59 Super-super Morbid Obesity: BMI 60+ Healthy nutrition and physical activity advised as adjunct to other disease management and risk reduction treatments    DVT prophylaxis: lovenox IV fluids: NS continuous IV fluids d/t AKI Nutrition: carb modified diet  Central lines / other devices: none  Code Status: FULL CODE ACP documentation reviewed:  none on file in [facility]  TOC needs: TBD Medical barriers to dispo: UTI, working up anemia and hypercalcemia. Expected medical readiness for discharge several days / pending results.

## 2023-06-04 NOTE — Assessment & Plan Note (Signed)
-   This is likely negative urine due to her UTI and possibly symptomatic anemia. - She will be admitted to a medical telemetry bed. - Management as below.

## 2023-06-04 NOTE — Assessment & Plan Note (Signed)
-   Will continue Synthroid and check TSH.

## 2023-06-04 NOTE — Assessment & Plan Note (Signed)
-  The patient will be placed on supplement coverage with NovoLog. - Will continue basal coverage.

## 2023-06-04 NOTE — TOC Initial Note (Signed)
 Transition of Care [facility] - Initial/Assessment Note    Patient Details  Name: Marissa Huff MRN: [id number] Date of Birth: Aug 02, 1931  [facility] of [facility]) CM/SW Contact:    Alexandra Ice, RN Phone Number: 06/04/2023, 2:32 PM  Clinical Narrative:                  Met with patient and spouse, Ethelle Herb, at bedside. He stated they live in single story home. She has had home health in the past, unable to recall name of agency, reports never gone to SNF for rehab. TOC will monitor for therapy recommends and follow up with spouse to discuss.    Barriers to Discharge: Continued Medical Work up   Patient Goals and CMS Choice   CMS Medicare.gov Compare Post Acute Care list provided to:: Patient Represenative (must comment) Wilkie Harding, spouse) Choice offered to / list presented to : Spouse      Expected Discharge Plan and Services       Living arrangements for the past 2 months: Single Family Home                                      Prior Living Arrangements/Services Living arrangements for the past 2 months: Single Family Home Lives with:: Spouse Patient language and need for interpreter reviewed:: Yes Do you feel safe going back to the place where you live?: Yes      Need for Family Participation in Patient Care: Yes (Comment) Care giver support system in place?: Yes (comment) Current home services: DME (rolling walker) Criminal Activity/Legal Involvement Pertinent to Current Situation/Hospitalization: No - Comment as needed  Activities of Daily Living   ADL Screening (condition at time of admission) Independently performs ADLs?: Yes (appropriate for developmental age) Is the patient deaf or have difficulty hearing?: No Does the patient have difficulty seeing, even when wearing glasses/contacts?: No Does the patient have difficulty concentrating, remembering, or making decisions?: Yes  Permission Sought/Granted                  Emotional  Assessment Appearance:: Appears stated age Attitude/Demeanor/Rapport: Unable to Assess Affect (typically observed): Unable to Assess Orientation: : Oriented to Self Alcohol / Substance Use: Not Applicable Psych Involvement: No (comment)  Admission diagnosis:  Generalized weakness [R53.1] Patient Active Problem List   Diagnosis Date Noted   Acute lower UTI 06/04/2023   Symptomatic anemia 06/04/2023   Type 1 diabetes mellitus with chronic kidney disease (HCC) 06/04/2023   Acute kidney injury superimposed on chronic kidney disease (HCC) 06/04/2023   Generalized weakness 06/03/2023   Muscle cramps 07/29/2022   Total knee replacement status 07/06/2022   Pre-op evaluation 07/04/2022   Primary osteoarthritis of left knee 06/19/2022   Memory change 03/02/2022   Anxiety 03/06/2021   Fall 10/13/2020   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   Sinusitis 12/30/2019   Macular degeneration 10/01/2018   Carotid artery disease, unspecified laterality (HCC) 02/23/2018   Right carotid bruit 02/04/2018   UPJ obstruction, congenital 07/26/2016   Lumbar stenosis with neurogenic claudication 09/15/2015   Nephrolithiasis 06/28/2015   Persistent cough 12/02/2014   Health care maintenance 05/11/2014   Obesity (BMI 30-39.9) 01/11/2014   GERD (gastroesophageal reflux disease) 01/11/2014   Environmental allergies 01/11/2014   CKD stage 3a, GFR 45-59 ml/min (HCC) 10/23/2013   Hydronephrosis 08/26/2013   Constipation 08/05/2013   DDD (degenerative disc disease), lumbar 07/15/2013  Lumbar radiculitis 05/10/2013   Right shoulder pain 05/10/2013   Anemia 04/15/2013   Carpal tunnel syndrome 04/15/2013   Trigger finger 04/15/2013   Hyperlipidemia due to type 1 diabetes mellitus (HCC) 04/15/2013   Microscopic hematuria 09/20/2012   Incomplete emptying of bladder 02/27/2012   Mixed urge and stress incontinence 02/27/2012   Symptoms involving urinary system 02/27/2012   Chronic cystitis 02/27/2012    Recurrent UTI 01/27/2012   History of lymphoma 12/12/2011   Spinal stenosis 12/12/2011   Diverticulosis 12/12/2011   Gastroparesis 12/12/2011   Peptic ulcer disease 12/12/2011   Hypothyroidism 12/12/2011   Hypercholesterolemia 12/12/2011   Hypertension 12/12/2011   Type 1 diabetes mellitus with hyperglycemia (HCC) 12/12/2011   Diverticulosis of colon 12/12/2011   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 [place] [address] Phone: [phone] Fax: [phone]  [id number], [place] - [address] [place] [address] Phone: [phone] Fax: [phone]     Social Drivers of Health (SDOH) Social History: SDOH Screenings   Food Insecurity: No Food Insecurity (06/04/2023)  Housing: Low Risk  (06/04/2023)  Transportation Needs: No Transportation Needs (06/04/2023)  Utilities: Not At Risk (06/04/2023)  Alcohol Screen: Low Risk  (12/05/2022)  Depression (PHQ2-9): Low Risk  (05/19/2023)  Financial Resource Strain: Low Risk  (12/05/2022)  Physical Activity: Inactive (12/05/2022)  Social Connections: Moderately Integrated (06/04/2023)  Stress: Stress Concern Present (12/05/2022)  Tobacco Use: Low Risk  (06/04/2023)  Health Literacy: Adequate Health Literacy (12/05/2022)   SDOH Interventions:     Readmission Risk Interventions     No data to display

## 2023-06-04 NOTE — Assessment & Plan Note (Signed)
-   She will be placed on IV Rocephin and will follow urine culture and sensitivity.

## 2023-06-04 NOTE — Assessment & Plan Note (Signed)
-   This is AKI superimposed on stage IIIa chronic kidney disease. - This is likely prerenal due to dehydration and volume depletion. - She will be hydrated with IV normal saline and will follow her BMP.

## 2023-06-04 NOTE — Progress Notes (Signed)
 PROGRESS NOTE    Marissa Huff   ZOX:[id number] DOB: 11-Oct-1931  DOA: 06/03/2023 Date of Service: 06/04/23 which is hospital day 1  PCP: Marissa Fenton, MD    Hospital course / significant events:   HPI: Marissa Huff is a 89 y.o. Caucasian female with medical history significant for carotid artery stenosis, osteoarthritis, diverticulosis, GERD, dyslipidemia, hypertension, hypothyroidism, PUD and type 1 diabetes mellitus, presented to the emergency room with acute onset of generalized weakness for the last few days with associated urinary frequency and urgency as well as altered mental status.   05/31: to ED from home. VSS. BUN of 55 and creatinine 1.98 above previous values and calcium  was 13 with blood glucose of 201. UA was positive for UTI. Started on Rocephin. Admitted to hospitalist service.  06/01: Cr 1.8. Working up hypercalcemia, anemia. Await UCx.      Consultants:  none  Procedures/Surgeries: none      ASSESSMENT & PLAN:   Generalized weakness Metabolic encephalopathy d/t UTI, electrolyte derangement, anemia Complicated by underlying dementia - per husband worsening past few months  Secondary to UTI and possibly symptomatic anemia. Manage underlying cause(s) as below    Acute lower UTI IV Rocephin  follow urine culture and sensitivity.   Symptomatic anemia Hgb on admission 7.3, was 12.0 four mos ago stool occult blood. Iron / Retic normal follow H&H Husband has provided verbal informed consent to transfusion if needed   Acute kidney injury superimposed on chronic kidney disease 3a Baseline Cr 0.7-1.1, GFR 43-51 likely prerenal due to dehydration and volume depletion. IV normal saline  follow BMP   Hypercalcemia 13.0 on admission --> 12.7 Ionized Ca, PTH, PO4 Monitor BMP  Type 1 diabetes mellitus  Basal insulin  + SSI   Hypothyroidism check TSH --> low, will add T4 however may be abn in acute illness Hold synthroid  pending T4  Chronic  back pain Tramadol  scheduled  Oxycodone  prn     Class 1 obesity based on BMI: Body mass index is 31.9 kg/m.Aaron Aas Significantly low or high BMI is associated with higher medical risk.  Underweight - under 18  overweight - 25 to 29 obese - 30 or more Class 1 obesity: BMI of 30.0 to 34 Class 2 obesity: BMI of 35.0 to 39 Class 3 obesity: BMI of 40.0 to 49 Super Morbid Obesity: BMI 50-59 Super-super Morbid Obesity: BMI 60+ Healthy nutrition and physical activity advised as adjunct to other disease management and risk reduction treatments    DVT prophylaxis: lovenox IV fluids: NS continuous IV fluids d/t AKI Nutrition: carb modified diet  Central lines / other devices: none  Code Status: FULL CODE ACP documentation reviewed:  none on file in [facility]  TOC needs: TBD Medical barriers to dispo: UTI, working up anemia and hypercalcemia. Expected medical readiness for discharge several days / pending results.              Subjective / Brief ROS:  Patient confused and very HoH - husband provides information. Confirms HPI as above. He notes increasing confusion over her baseline, she is typically able to care for self and perform own ADL (dressing, toileting, hygiene/bathing) but has been deteriorating few months/weeks and rapidly so in past week or so. He states she has not had other complaints other than chronic back pain.   Family Communication: husband is at bedside on rounds     Objective Findings:  Vitals:   06/04/23 0039 06/04/23 0120 06/04/23 0416 06/04/23 0831  BP:  (!) 119/37 Aaron Aas)  110/35 (!) 116/36  Pulse:  87 87 86  Resp:  18 18 18   Temp: 97.8 F (36.6 C) 98.1 F (36.7 C) 98 F (36.7 C) 97.6 F (36.4 C)  TempSrc: Oral Oral    SpO2:  98% 96% 96%  Weight:  79.1 kg    Height:        Intake/Output Summary (Last 24 hours) at 06/04/2023 1110 Last data filed at 06/04/2023 0900 Gross per 24 hour  Intake 882.58 ml  Output 300 ml  Net 582.58 ml   Filed Weights    06/04/23 0034 06/04/23 0120  Weight: 78.9 kg 79.1 kg    Examination:  Physical Exam Constitutional:      General: She is not in acute distress. Cardiovascular:     Rate and Rhythm: Normal rate and regular rhythm.  Pulmonary:     Effort: Pulmonary effort is normal.     Breath sounds: Normal breath sounds.  Abdominal:     General: Abdomen is flat.     Palpations: Abdomen is soft.  Musculoskeletal:     Right lower leg: No edema.     Left lower leg: No edema.  Skin:    General: Skin is warm and dry.  Neurological:     Mental Status: She is alert. She is disoriented.     Cranial Nerves: No cranial nerve deficit.     Motor: No weakness.          Scheduled Medications:   busPIRone   5 mg Oral Daily   enoxaparin (LOVENOX) injection  30 mg Subcutaneous Q24H   [START ON 06/05/2023] fluticasone   2 spray Each Nare Daily   insulin  aspart  0-5 Units Subcutaneous QHS   insulin  aspart  0-9 Units Subcutaneous TID WC   insulin  aspart  4 Units Subcutaneous TID WC   insulin  glargine-yfgn  20 Units Subcutaneous QHS   irbesartan   150 mg Oral Daily   levothyroxine   200 mcg Oral Q0600   loratadine   10 mg Oral Daily   multivitamin with minerals  1 tablet Oral Daily   pantoprazole   40 mg Oral BID   polyvinyl alcohol  1 drop Both Eyes QHS   [START ON 06/05/2023] rosuvastatin   5 mg Oral Once per day on Monday Wednesday Friday   traMADol   50 mg Oral TID    Continuous Infusions:  sodium chloride  100 mL/hr at 06/04/23 0126   cefTRIAXone (ROCEPHIN)  IV      PRN Medications:  acetaminophen  **OR** acetaminophen , albuterol , dextromethorphan-guaiFENesin , magnesium  hydroxide, ondansetron  **OR** ondansetron  (ZOFRAN ) IV, oxyCODONE , traZODone  Antimicrobials from admission:  Anti-infectives (From admission, onward)    Start     Dose/Rate Route Frequency Ordered Stop   06/04/23 2200  cefTRIAXone (ROCEPHIN) 1 g in sodium chloride  0.9 % 100 mL IVPB        1 g 200 mL/hr over 30 Minutes Intravenous  Every 24 hours 06/04/23 0004     06/04/23 0045  cefTRIAXone (ROCEPHIN) 1 g in sodium chloride  0.9 % 100 mL IVPB  Status:  Discontinued        1 g 200 mL/hr over 30 Minutes Intravenous Every 24 hours 06/04/23 0031 06/04/23 0031   06/03/23 2330  cefTRIAXone (ROCEPHIN) 1 g in sodium chloride  0.9 % 100 mL IVPB        1 g 200 mL/hr over 30 Minutes Intravenous  Once 06/03/23 2323 06/04/23 0017           Data Reviewed:  I have personally reviewed the following.Aaron AasAaron Aas  CBC: Recent Labs  Lab 06/03/23 2225 06/04/23 0127  WBC 5.9 5.6  NEUTROABS 4.1  --   HGB 7.3* 7.2*  HCT 22.8* 21.7*  MCV 97.4 96.4  PLT 181 164   Basic Metabolic Panel: Recent Labs  Lab 06/03/23 2225 06/04/23 0127 06/04/23 0927  NA 136 137  --   K 4.3 4.0  --   CL 103 104  --   CO2 22 24  --   GLUCOSE 201* 185*  --   BUN 55* 53*  --   CREATININE 1.98* 1.80*  --   CALCIUM  13.0* 12.7*  --   PHOS  --   --  4.4   GFR: Estimated Creatinine Clearance: 20.2 mL/min (A) (by C-G formula based on SCr of 1.8 mg/dL (H)). Liver Function Tests: No results for input(s): "AST", "ALT", "ALKPHOS", "BILITOT", "PROT", "ALBUMIN" in the last 168 hours. No results for input(s): "LIPASE", "AMYLASE" in the last 168 hours. No results for input(s): "AMMONIA" in the last 168 hours. Coagulation Profile: No results for input(s): "INR", "PROTIME" in the last 168 hours. Cardiac Enzymes: No results for input(s): "CKTOTAL", "CKMB", "CKMBINDEX", "TROPONINI" in the last 168 hours. BNP (last 3 results) No results for input(s): "PROBNP" in the last 8760 hours. HbA1C: No results for input(s): "HGBA1C" in the last 72 hours. CBG: No results for input(s): "GLUCAP" in the last 168 hours. Lipid Profile: No results for input(s): "CHOL", "HDL", "LDLCALC", "TRIG", "CHOLHDL", "LDLDIRECT" in the last 72 hours. Thyroid  Function Tests: Recent Labs    06/04/23 0127 06/04/23 0543  TSH  --  0.085*  FREET4 4.59*  --    Anemia Panel: Recent Labs     06/04/23 0543  VITAMINB12 4,204*  FOLATE 14.2  FERRITIN 137  TIBC 318  IRON 42  RETICCTPCT 1.9   Most Recent Urinalysis On File:     Component Value Date/Time   COLORURINE YELLOW (A) 06/03/2023 2225   APPEARANCEUR CLOUDY (A) 06/03/2023 2225   LABSPEC 1.013 06/03/2023 2225   PHURINE 5.0 06/03/2023 2225   GLUCOSEU NEGATIVE 06/03/2023 2225   GLUCOSEU NEGATIVE 10/11/2022 1007   HGBUR NEGATIVE 06/03/2023 2225   BILIRUBINUR NEGATIVE 06/03/2023 2225   BILIRUBINUR negative 08/22/2016 0958   KETONESUR NEGATIVE 06/03/2023 2225   PROTEINUR 30 (A) 06/03/2023 2225   UROBILINOGEN 0.2 10/11/2022 1007   NITRITE NEGATIVE 06/03/2023 2225   LEUKOCYTESUR MODERATE (A) 06/03/2023 2225   Sepsis Labs: @LABRCNTIP (procalcitonin:4,lacticidven:4) Microbiology: No results found for this or any previous visit (from the past 240 hours).    Radiology Studies last 3 days: DG Chest Portable 1 View Result Date: 06/03/2023 EXAM: 1 VIEW XRAY OF THE CHEST 06/03/2023 10:51:04 PM COMPARISON: 10/28/2021 CLINICAL HISTORY: Weakness. PER ER NOTE; BIB ems from home for weakness and altered mental status has been going on for a week and has just been getting worse per husband; Per ems "husband thinks it is dementia but unsure" FINDINGS: LUNGS AND PLEURA: No focal pulmonary opacity. No pulmonary edema. No pleural effusion. No pneumothorax. HEART AND MEDIASTINUM: No acute abnormality of the cardiac and mediastinal silhouettes. BONES AND SOFT TISSUES: No acute osseous abnormality. IMPRESSION: 1. No acute process. Electronically signed by: Zadie Herter MD 06/03/2023 10:52 PM EDT RP Workstation: [id number]         Melodi Sprung, DO [facility] 06/04/2023, 11:10 AM    Dictation software may have been used to generate the above note. Typos may occur and escape review in typed/dictated notes. Please contact Dr Authur Leghorn directly for clarity if needed.  Staff may message me via secure chat in Epic  but  this may not receive an immediate response,  please page me for urgent matters!  If 7PM-7AM, please contact night coverage [email or web address]

## 2023-06-04 NOTE — Progress Notes (Signed)
 PHARMACIST - PHYSICIAN COMMUNICATION  CONCERNING:  Enoxaparin (Lovenox) for DVT Prophylaxis    RECOMMENDATION: Patient was prescribed enoxaprin 40mg  q24 hours for VTE prophylaxis.   There were no vitals filed for this visit.  There is no height or weight on file to calculate BMI.  CrCl cannot be calculated (Unknown ideal weight.).  Patient is candidate for enoxaparin 30mg  every 24 hours based on CrCl <30ml/min or Weight <45kg  DESCRIPTION: Pharmacy has adjusted enoxaparin dose per [facility] policy.  Patient is now receiving enoxaparin 30 mg every 24 hours   Coretta Dexter, PharmD, [facility] 06/04/2023 12:13 AM

## 2023-06-04 NOTE — H&P (Signed)
 [place]   PATIENT NAME: Marissa Huff    MR#:  [id number]  DATE OF BIRTH:  11/25/1931  DATE OF ADMISSION:  06/03/2023  PRIMARY CARE PHYSICIAN: Dellar Fenton, MD   Patient is coming from: Home  REQUESTING/REFERRING PHYSICIAN: Marylynn Soho, MD  CHIEF COMPLAINT:   Chief Complaint  Patient presents with   Weakness   Altered Mental Status    HISTORY OF PRESENT ILLNESS:  DA Huff is a 89 y.o. Caucasian female with medical history significant for carotid artery stenosis, osteoarthritis, diverticulosis, GERD, dyslipidemia, hypertension, hypothyroidism, PUD and type 1 diabetes mellitus, presented to the emergency room with acute onset of generalized weakness for the last few days with associated urinary frequency and urgency as well as altered mental status.  She admitted to mild cough without wheezing or dyspnea at rest.  She has been having fatigue and dyspnea on exertion.  No chest pain or palpitations.  No nausea or vomiting or abdominal pain.  No diarrhea or melena or bright red bleeding per rectum.  No other bleeding diathesis.  ED Course: When she came to the ER, vital signs were within normal.  Labs revealed a BUN of 55 and creatinine 1.98 above previous values and calcium  was 13 with blood glucose of 201.  UA was positive for UTI. EKG as reviewed by me : EKG showed normal sinus rhythm rate of 90 with PACs and Q waves anteroseptal. Imaging: Portable chest x-ray showed no acute cardiopulmonary disease.  The patient was given 1 g of IV Rocephin.  She will be admitted to a medical telemetry bed for further evaluation and management. PAST MEDICAL HISTORY:   Past Medical History:  Diagnosis Date   Allergy    Arthritis    Carotid artery disease (HCC)    Carpal tunnel syndrome, bilateral    Chronic kidney disease, stage 2, mildly decreased GFR    Diabetes mellitus without complication (HCC)    Diverticulosis    Gastric lymphoma (HCC)    Gastroparesis    GERD  (gastroesophageal reflux disease)    History of kidney stones    Hyperlipidemia    Hypertension associated with diabetes (HCC)    Hypothyroidism    Malignant lymphoma (HCC)    gastric   PUD (peptic ulcer disease)    Skin cancer    Spinal stenosis    lumbar laminectomy   Type 1 diabetes mellitus (HCC)    Urinary incontinence     PAST SURGICAL HISTORY:   Past Surgical History:  Procedure Laterality Date   antrectomy and vagotomy  1994   CARPAL TUNNEL RELEASE Left 10/24/2008   CARPAL TUNNEL RELEASE Right 06/19/2009   CATARACT EXTRACTION, BILATERAL Bilateral 2014   COLONOSCOPY  05/24/2007   EYE SURGERY     bilateral cataracts   gastric lymphoma  1996   surgery   KNEE ARTHROPLASTY Left 07/06/2022   Procedure: COMPUTER ASSISTED TOTAL KNEE ARTHROPLASTY;  Surgeon: Arlyne Lame, MD;  Location: [facility] ORS;  Service: Orthopedics;  Laterality: Left;   KNEE ARTHROSCOPY Left 12/05/2008   KNEE ARTHROSCOPY Right 06/16/2010   LAPAROSCOPIC CHOLECYSTECTOMY  06/1999   LUMBAR LAMINECTOMY  1999   LUMBAR LAMINECTOMY WITH COFLEX 1 LEVEL N/A 09/15/2015   Procedure: Lumbar one-two Laminectomy with placement of coflex;  Surgeon: Elna Haggis, MD;  Location: [facility];  Service: Neurosurgery;  Laterality: N/A;   PARTIAL HYSTERECTOMY  02/1978   fibroid tumors, ovaries not removed   TONSILLECTOMY AND ADENOIDECTOMY  1982  TRIGGER FINGER RELEASE Right 06/19/2009    SOCIAL HISTORY:   Social History   Tobacco Use   Smoking status: Never   Smokeless tobacco: Never  Substance Use Topics   Alcohol use: No    Alcohol/week: 0.0 standard drinks of alcohol    FAMILY HISTORY:   Family History  Problem Relation Age of Onset   Stroke Mother    Stroke Father    Hypertension Father    Diabetes Brother    Multiple sclerosis Brother    Breast cancer Daughter 59   Colon cancer Neg Hx     DRUG ALLERGIES:   Allergies  Allergen Reactions   Penicillins Rash    IgE = 10 (WNL) on 06/28/2022     REVIEW OF SYSTEMS:   ROS As per history of present illness. All pertinent systems were reviewed above. Constitutional, HEENT, cardiovascular, respiratory, GI, GU, musculoskeletal, neuro, psychiatric, endocrine, integumentary and hematologic systems were reviewed and are otherwise negative/unremarkable except for positive findings mentioned above in the HPI.   MEDICATIONS AT HOME:   Prior to Admission medications   Medication Sig Start Date End Date Taking? Authorizing Provider  acetaminophen  (TYLENOL ) 325 MG tablet Take 1-2 tablets (325-650 mg total) by mouth every 6 (six) hours as needed for mild pain (pain score 1-3 or temp > 100.5). 07/07/22   Coralyn Derry, PA-C  albuterol  (VENTOLIN  HFA) 108 (90 Base) MCG/ACT inhaler Inhale 1-2 puffs into the lungs every 6 (six) hours as needed for wheezing or shortness of breath. 05/19/23   Dellar Fenton, MD  busPIRone  (BUSPAR ) 5 MG tablet Take 1 tablet (5 mg total) by mouth daily. 05/19/23   Dellar Fenton, MD  clobetasol  (TEMOVATE ) 0.05 % external solution Apply 1 Application topically 2 (two) times daily.    [provider]  Continuous Blood Gluc Receiver (FREESTYLE LIBRE 14 DAY READER) DEVI Use 1 each as needed .  Use to test blood sugar 10/25/17   [provider]  Continuous Blood Gluc Sensor (FREESTYLE LIBRE 14 DAY SENSOR) MISC Use 1 each every 14 (fourteen) days 10/25/17   [provider]  dextromethorphan-guaiFENesin  (MUCINEX  DM) 30-600 MG 12hr tablet Take 1 tablet by mouth daily as needed for cough.    [provider]  fexofenadine (ALLEGRA) 180 MG tablet Take 180 mg by mouth daily.    [provider]  fluticasone  (FLONASE ) 50 MCG/ACT nasal spray SPRAY 2 SPRAYS INTO EACH NOSTRIL EVERY DAY 05/19/23   Dellar Fenton, MD  glucagon  (GLUCAGON  EMERGENCY) 1 MG injection Inject 1 mg into the muscle once as needed. 05/21/15   Dellar Fenton, MD  insulin  aspart (NOVOLOG  FLEXPEN) 100 UNIT/ML FlexPen Inject  4-13 Units into the skin 3 (three) times daily with meals. Sliding scale 05/22/20   [provider]  LANTUS  SOLOSTAR 100 UNIT/ML Solostar Pen Inject 20-26 Units into the skin at bedtime. Sliding scale 03/08/17   [provider]  levothyroxine  (SYNTHROID ) 200 MCG tablet TAKE 1 TABLET (200 MCG TOTAL) BY MOUTH DAILY BEFORE BREAKFAST. 05/01/23   Dellar Fenton, MD  MULTIPLE VITAMINS-MINERALS PO Take 1 tablet by mouth daily.    [provider]  Olopatadine  HCl (PATADAY ) 0.7 % SOLN Apply 1 drop to eye daily as needed.    [provider]  OVER THE COUNTER MEDICATION Take 2 capsules by mouth daily. Cerebra Brain Health    [provider]  OVER THE COUNTER MEDICATION Take 2 capsules by mouth daily as needed. Gluco Switch Blood sugar support  [provider]  pantoprazole  (PROTONIX ) 40 MG tablet Take 1 tablet (40 mg total) by mouth 2 (two) times daily. 05/19/23   Dellar Fenton, MD  Polyethyl Glycol-Propyl Glycol (SYSTANE OP) Place 1 drop into both eyes at bedtime.    [provider]  Polyethylene Glycol 3350  (MIRALAX  PO) Take 1 Dose by mouth daily as needed.    [provider]  rosuvastatin  (CRESTOR ) 5 MG tablet TAKE 1 TABLET BY MOUTH THREE TIMES A WEEK. 05/19/23   Dellar Fenton, MD  traMADol  (ULTRAM ) 50 MG tablet Take 50 mg by mouth as needed. 02/15/23   [provider]  valsartan  (DIOVAN ) 160 MG tablet Take 1 tablet (160 mg total) by mouth daily. 05/19/23   Dellar Fenton, MD  Wheat Dextrin (BENEFIBER) POWD Take 1 Dose by mouth daily as needed.    [provider]      VITAL SIGNS:  Blood pressure (!) 110/35, pulse 87, temperature 98 F (36.7 C), resp. rate 18, height 5\' 2"  (1.575 m), weight 78.9 kg, SpO2 96%.  PHYSICAL EXAMINATION:  Physical Exam  GENERAL:  89 y.o.-year-old patient lying in the bed with no acute distress.  EYES: Pupils equal, round, reactive to light and accommodation. No scleral icterus.  Extraocular muscles intact.  HEENT: Head atraumatic, normocephalic. Oropharynx and nasopharynx clear.  NECK:  Supple, no jugular venous distention. No thyroid  enlargement, no tenderness.  LUNGS: Normal breath sounds bilaterally, no wheezing, rales,rhonchi or crepitation. No use of accessory muscles of respiration.  CARDIOVASCULAR: Regular rate and rhythm, S1, S2 normal. No murmurs, rubs, or gallops.  ABDOMEN: Soft, nondistended, nontender. Bowel sounds present. No organomegaly or mass.  EXTREMITIES: No pedal edema, cyanosis, or clubbing.  NEUROLOGIC: Cranial nerves II through XII are intact. Muscle strength 5/5 in all extremities. Sensation intact. Gait not checked.  PSYCHIATRIC: The patient is alert and oriented x 1 to her name.  Normal affect and good eye contact. SKIN: No obvious rash, lesion, or ulcer.   LABORATORY PANEL:   CBC Recent Labs  Lab 06/04/23 0127  WBC 5.6  HGB 7.2*  HCT 21.7*  PLT 164   ------------------------------------------------------------------------------------------------------------------  Chemistries  Recent Labs  Lab 06/04/23 0127  NA 137  K 4.0  CL 104  CO2 24  GLUCOSE 185*  BUN 53*  CREATININE 1.80*  CALCIUM  12.7*   ------------------------------------------------------------------------------------------------------------------  Cardiac Enzymes No results for input(s): "TROPONINI" in the last 168 hours. ------------------------------------------------------------------------------------------------------------------  RADIOLOGY:  DG Chest Portable 1 View Result Date: 06/03/2023 EXAM: 1 VIEW XRAY OF THE CHEST 06/03/2023 10:51:04 PM COMPARISON: 10/28/2021 CLINICAL HISTORY: Weakness. PER ER NOTE; BIB ems from home for weakness and altered mental status has been going on for a week and has just been getting worse per husband; Per ems "husband thinks it is dementia but unsure" FINDINGS: LUNGS AND PLEURA: No focal pulmonary opacity. No pulmonary  edema. No pleural effusion. No pneumothorax. HEART AND MEDIASTINUM: No acute abnormality of the cardiac and mediastinal silhouettes. BONES AND SOFT TISSUES: No acute osseous abnormality. IMPRESSION: 1. No acute process. Electronically signed by: Zadie Herter MD 06/03/2023 10:52 PM EDT RP Workstation: [id number]      IMPRESSION AND PLAN:  Assessment and Plan: * Generalized weakness - This is likely negative urine due to her UTI and possibly symptomatic anemia. - She will be admitted to a medical telemetry bed. - Management as below.  Acute lower UTI - She will be placed on IV Rocephin and will follow urine culture and sensitivity.  Symptomatic anemia - Anemia  workup will be obtained including stool occult blood. - Will follow H&H.  Acute kidney injury superimposed on chronic kidney disease (HCC) - This is AKI superimposed on stage IIIa chronic kidney disease. - This is likely prerenal due to dehydration and volume depletion. - She will be hydrated with IV normal saline and will follow her BMP.  Type 1 diabetes mellitus with chronic kidney disease (HCC) - The patient will be placed on supplement coverage with NovoLog . - Will continue basal coverage.  Hypothyroidism - Will continue Synthroid  and check TSH.       DVT prophylaxis: Lovenox.  Advanced Care Planning:  Code Status: full code.  Family Communication:  The plan of care was discussed in details with the patient (and family). I answered all questions. The patient agreed to proceed with the above mentioned plan. Further management will depend upon hospital course. Disposition Plan: Back to previous home environment Consults called: none.  All the records are reviewed and case discussed with ED provider.  Status is: Inpatient   At the time of the admission, it appears that the appropriate admission status for this patient is inpatient.  This is judged to be reasonable and necessary in order to provide the required  intensity of service to ensure the patient's safety given the presenting symptoms, physical exam findings and initial radiographic and laboratory data in the context of comorbid conditions.  The patient requires inpatient status due to high intensity of service, high risk of further deterioration and high frequency of surveillance required.  I certify that at the time of admission, it is my clinical judgment that the patient will require inpatient hospital care extending more than 2 midnights.                            Dispo: The patient is from: Home              Anticipated Marissa/c is to: Home              Patient currently is not medically stable to Marissa/c.              Difficult to place patient: No  Virgene Griffin M.Marissa on 06/04/2023 at 5:15 AM  [facility]   From 7 PM-7 AM, contact night-coverage [email or web address]  CC: Primary care physician; Dellar Fenton, MD

## 2023-06-04 NOTE — ED Notes (Addendum)
 Dr. Achilles Holes came to see pt before transport upstairs to 114.

## 2023-06-04 NOTE — Evaluation (Signed)
 Physical Therapy Evaluation Patient Details Name: Marissa Huff MRN: [id number] DOB: [birth year] Today's Date: 06/04/2023  History of Present Illness  Marissa Huff is a 89 y.o. Caucasian female with medical history significant for carotid artery stenosis, osteoarthritis, diverticulosis, GERD, dyslipidemia, hypertension, hypothyroidism, PUD and type 1 diabetes mellitus, presented to the emergency room with acute onset of generalized weakness for the last few days with associated urinary frequency and urgency as well as altered mental status.   Clinical Impression  Pt admitted with above diagnosis. Pt currently with functional limitations due to the deficits listed below (see PT Problem List). Pt received upright in bed with spouse present in room. Pt with unintelligible and tangential speech. Per spouse pt has had worsening cognition in the prior months especially with her short term memory and her cognition right now is worse than normal. Reports PTA being mod-I with RW in home. Master bed/bath is upstairs and pt has had to sleep in recliner down stairs more regularly due to difficulty with steps and needing assist from spouse and daughters for ADL's.   To date, pt reliant on maxA and max multimodal cuing for supine to sit EOB. modA+1 for STS to RW with mod multimodal cuing for hand placement. Once standing pt urinates and purewick malfunction leads to urination on floor. Dependent for soiled depends diaper removal. Pt returned to sitting and dependent for doffing wet socks. Pt given socks but due to cognition and max multi modal cuing does not initiate donning. MaxA+1 to stand once again and max multi modal cuing to L lateral side steps to [facility] with noticeable crouching posture and increased UE reliance on RW indicative of fatigue with pt requesting to sit. Pt returned to supine minA at torso and LE's and sat up in bed with all needs in reach. Pt appears far below baseline from PLOF placing her at  increased risk of falls. Spouse with goals to return home after hospitalization. PT recommending f/u PT services < 3 hours/day to address acute deficits.       If plan is discharge home, recommend the following: A lot of help with walking and/or transfers;A lot of help with bathing/dressing/bathroom;Assistance with cooking/housework;Assist for transportation;Help with stairs or ramp for entrance   Can travel by private vehicle   No    Equipment Recommendations Other (comment) (TBD by next venue of care)  Recommendations for Other Services       Functional Status Assessment Patient has had a recent decline in their functional status and demonstrates the ability to make significant improvements in function in a reasonable and predictable amount of time.     Precautions / Restrictions Precautions Precautions: Fall Restrictions Weight Bearing Restrictions Per Provider Order: No      Mobility  Bed Mobility Overal bed mobility: Needs Assistance Bed Mobility: Supine to Sit     Supine to sit: HOB elevated, Used rails, Max assist     General bed mobility comments: pt able to initiate attempts to come to sitting EOB but ultimately needs heavy support to complete. Patient Response: Cooperative  Transfers Overall transfer level: Needs assistance Equipment used: Rolling walker (2 wheels) Transfers: Sit to/from Stand Sit to Stand: Mod assist           General transfer comment: x2 STS to RW. Max VC's for hand placement.    Ambulation/Gait               General Gait Details: deferred due to cognition and difficulty following commands. Poor  standing tolerance at EOB.  Stairs            Wheelchair Mobility     Tilt Bed Tilt Bed Patient Response: Cooperative  Modified Rankin (Stroke Patients Only)       Balance Overall balance assessment: Needs assistance Sitting-balance support: No upper extremity supported, Feet supported Sitting balance-Leahy Scale:  Fair       Standing balance-Leahy Scale: Poor Standing balance comment: heavy UE reliance on RW                             Pertinent Vitals/Pain Pain Assessment Pain Assessment: No/denies pain    Home Living Family/patient expects to be discharged to:: Private residence Living Arrangements: Spouse/significant other Available Help at Discharge: Family;Available 24 hours/day Type of Home: House Home Access: Stairs to enter Entrance Stairs-Rails: Doctor, general practice of Steps: 3     Home Equipment: Agricultural consultant (2 wheels);BSC/3in1;Cane - single point      Prior Function Prior Level of Function : Needs assist             Mobility Comments: RW in household. Been sleeping in recliner on main floor due to more difficulty navigating flight of steps to master bedroom upstairs. ADLs Comments: Spouse at bedside has reported gradual decline in cognition specifically Short term memroy needing mroe assist with mobility, medication management. Doesn't leave house much due to difficulty completing car transfers. Now assisting with ADL's. Wears depends a thome for urinary incontinence.     Extremity/Trunk Assessment   Upper Extremity Assessment Upper Extremity Assessment: Defer to OT evaluation    Lower Extremity Assessment Lower Extremity Assessment: Generalized weakness       Communication   Communication Communication: Impaired Factors Affecting Communication: Hearing impaired;Difficulty expressing self    Cognition Arousal: Alert                             PT - Cognition Comments: Very tangential speech along with unintelligable speech. Follows commands with mod to max multi modal cuing. Very pleasant and cooperative. Following commands: Impaired Following commands impaired: Follows one step commands with increased time, Follows multi-step commands with increased time     Cueing Cueing Techniques: Verbal cues, Gestural cues,  Tactile cues, Visual cues     General Comments      Exercises     Assessment/Plan    PT Assessment Patient needs continued PT services  PT Problem List Decreased strength;Decreased mobility;Decreased safety awareness;Decreased cognition;Decreased balance       PT Treatment Interventions DME instruction;Therapeutic exercise;Gait training;Balance training;Stair training;Neuromuscular re-education;Functional mobility training;Therapeutic activities;Patient/family education;Cognitive remediation    PT Goals (Current goals can be found in the Care Plan section)  Acute Rehab PT Goals Patient Stated Goal: to go home PT Goal Formulation: With family Time For Goal Achievement: 06/18/23 Potential to Achieve Goals: Poor    Frequency Min 3X/week     Co-evaluation               AM-PAC PT "6 Clicks" Mobility  Outcome Measure Help needed turning from your back to your side while in a flat bed without using bedrails?: A Lot Help needed moving from lying on your back to sitting on the side of a flat bed without using bedrails?: A Lot Help needed moving to and from a bed to a chair (including a wheelchair)?: A Lot Help needed standing up from a chair  using your arms (e.g., wheelchair or bedside chair)?: A Lot Help needed to walk in hospital room?: A Lot Help needed climbing 3-5 steps with a railing? : A Lot 6 Click Score: 12    End of Session Equipment Utilized During Treatment: Gait belt Activity Tolerance: Patient limited by fatigue;Other (comment) (cognition) Patient left: in bed;with call bell/phone within reach;with bed alarm set;with family/visitor present Nurse Communication: Mobility status PT Visit Diagnosis: Muscle weakness (generalized) (M62.81);Difficulty in walking, not elsewhere classified (R26.2);Other abnormalities of gait and mobility (R26.89)    Time: 1610-9604 PT Time Calculation (min) (ACUTE ONLY): 30 min   Charges:   PT Evaluation $PT Eval Moderate  Complexity: 1 Mod PT Treatments $Therapeutic Activity: 8-22 mins PT General Charges $$ ACUTE PT VISIT: 1 Visit        Marc Senior. Fairly IV, PT, DPT Physical Therapist- [place]  [facility]  06/04/2023, 10:35 AM

## 2023-06-04 NOTE — Plan of Care (Signed)
   Problem: Activity: Goal: Risk for activity intolerance will decrease Outcome: Progressing   Problem: Coping: Goal: Level of anxiety will decrease Outcome: Progressing   Problem: Elimination: Goal: Will not experience complications related to bowel motility Outcome: Progressing   Problem: Safety: Goal: Ability to remain free from injury will improve Outcome: Progressing

## 2023-06-04 NOTE — Assessment & Plan Note (Signed)
-   Anemia workup will be obtained including stool occult blood. - Will follow H&H.

## 2023-06-05 ENCOUNTER — Inpatient Hospital Stay

## 2023-06-05 DIAGNOSIS — R531 Weakness: Secondary | ICD-10-CM | POA: Diagnosis not present

## 2023-06-05 LAB — HEMOGLOBIN AND HEMATOCRIT, BLOOD
HCT: 25.5 % — ABNORMAL LOW (ref 36.0–46.0)
Hemoglobin: 8.5 g/dL — ABNORMAL LOW (ref 12.0–15.0)

## 2023-06-05 LAB — GLUCOSE, CAPILLARY
Glucose-Capillary: 135 mg/dL — ABNORMAL HIGH (ref 70–99)
Glucose-Capillary: 143 mg/dL — ABNORMAL HIGH (ref 70–99)
Glucose-Capillary: 137 mg/dL — ABNORMAL HIGH (ref 70–99)
Glucose-Capillary: 156 mg/dL — ABNORMAL HIGH (ref 70–99)
Glucose-Capillary: 171 mg/dL — ABNORMAL HIGH (ref 70–99)

## 2023-06-05 LAB — PARATHYROID HORMONE, INTACT (NO CA): PTH: 5 pg/mL — ABNORMAL LOW (ref 15–65)

## 2023-06-05 LAB — COMPREHENSIVE METABOLIC PANEL WITH GFR
ALT: 20 U/L (ref 0–44)
AST: 15 U/L (ref 15–41)
Albumin: 3.3 g/dL — ABNORMAL LOW (ref 3.5–5.0)
Alkaline Phosphatase: 51 U/L (ref 38–126)
Anion gap: 7 (ref 5–15)
BUN: 40 mg/dL — ABNORMAL HIGH (ref 8–23)
CO2: 23 mmol/L (ref 22–32)
Calcium: 12.4 mg/dL — ABNORMAL HIGH (ref 8.9–10.3)
Chloride: 110 mmol/L (ref 98–111)
Creatinine, Ser: 1.35 mg/dL — ABNORMAL HIGH (ref 0.44–1.00)
GFR, Estimated: 37 mL/min — ABNORMAL LOW (ref >60)
Glucose, Bld: 131 mg/dL — ABNORMAL HIGH (ref 70–99)
Potassium: 3.8 mmol/L (ref 3.5–5.1)
Sodium: 140 mmol/L (ref 135–145)
Total Bilirubin: 0.7 mg/dL (ref 0.0–1.2)
Total Protein: 6 g/dL — ABNORMAL LOW (ref 6.5–8.1)

## 2023-06-05 LAB — CBC
HCT: 21.9 % — ABNORMAL LOW (ref 36.0–46.0)
Hemoglobin: 7.2 g/dL — ABNORMAL LOW (ref 12.0–15.0)
MCH: 31.9 pg (ref 26.0–34.0)
MCHC: 32.9 g/dL (ref 30.0–36.0)
MCV: 96.9 fL (ref 80.0–100.0)
Platelets: 158 10*3/uL (ref 150–400)
RBC: 2.26 MIL/uL — ABNORMAL LOW (ref 3.87–5.11)
RDW: 17.4 % — ABNORMAL HIGH (ref 11.5–15.5)
WBC: 6.9 10*3/uL (ref 4.0–10.5)
nRBC: 0 % (ref 0.0–0.2)

## 2023-06-05 LAB — CALCIUM, IONIZED: Calcium, Ionized, Serum: 7.1 mg/dL — ABNORMAL HIGH (ref 4.5–5.6)

## 2023-06-05 LAB — PREPARE RBC (CROSSMATCH)

## 2023-06-05 LAB — HAPTOGLOBIN: Haptoglobin: 165 mg/dL (ref 41–333)

## 2023-06-05 MED ORDER — LORAZEPAM 2 MG/ML IJ SOLN: 1.0000 mg | Freq: Once | INTRAMUSCULAR | Status: DC | PRN

## 2023-06-05 MED ORDER — MORPHINE SULFATE (PF) 2 MG/ML IV SOLN
1.0000 mg | INTRAVENOUS | Status: DC | PRN
  Administered 2023-06-05 – 2023-06-06 (×2): 2 mg via INTRAVENOUS
  Filled 2023-06-05 (×5): qty 1

## 2023-06-05 MED ORDER — SODIUM CHLORIDE 0.9 % IV SOLN: INTRAVENOUS | Status: DC

## 2023-06-05 MED ORDER — GADOBUTROL 1 MMOL/ML IV SOLN
7.0000 mL | Freq: Once | INTRAVENOUS | Status: AC | PRN
  Administered 2023-06-05: 7 mL via INTRAVENOUS

## 2023-06-05 MED ORDER — SODIUM CHLORIDE 0.9% IV SOLUTION: Freq: Once | INTRAVENOUS | Status: AC

## 2023-06-05 MED ORDER — PANTOPRAZOLE SODIUM 40 MG IV SOLR
40.0000 mg | INTRAVENOUS | Status: DC
  Administered 2023-06-06: 40 mg via INTRAVENOUS
  Filled 2023-06-05: qty 10

## 2023-06-05 NOTE — Progress Notes (Signed)
 Physical Therapy Treatment Patient Details Name: Marissa Huff MRN: [id number] DOB: 1931-12-15 Today's Date: 06/05/2023   History of Present Illness Marissa Huff is a 89 y.o. Caucasian female with medical history significant for carotid artery stenosis, osteoarthritis, diverticulosis, GERD, dyslipidemia, hypertension, hypothyroidism, PUD and type 1 diabetes mellitus, presented to the emergency room with acute onset of generalized weakness for the last few days with associated urinary frequency and urgency as well as altered mental status.    PT Comments  Pt lethargic but did respond to light verbal and tactile cues.  Pt unable to express herself verbally but was able to follow some 1-step commands with extra time and cuing.  Pt required total assist with rolling and sup to/from sit and presented with poor static sitting balance with posterior and R lateral lean that did not improve significantly during anterior weight shifting activities.  Pt did make several attempts to stand with +2 HHA but was unable to clear the surface of the bed generating very little notable force during her attempts.  Pt will benefit from continued PT services upon discharge to safely address deficits listed in patient problem list for decreased caregiver assistance and eventual return to PLOF.     If plan is discharge home, recommend the following: A lot of help with bathing/dressing/bathroom;Assistance with cooking/housework;Assist for transportation;Help with stairs or ramp for entrance;Two people to help with walking and/or transfers   Can travel by private vehicle     No  Equipment Recommendations  Other (comment) (TBD)    Recommendations for Other Services       Precautions / Restrictions Precautions Precautions: Fall Recall of Precautions/Restrictions: Impaired Restrictions Weight Bearing Restrictions Per Provider Order: No     Mobility  Bed Mobility Overal bed mobility: Needs Assistance Bed  Mobility: Rolling, Supine to Sit, Sit to Supine Rolling: +2 for physical assistance, Total assist   Supine to sit: Total assist, HOB elevated, +2 for physical assistance Sit to supine: Total assist, +2 for physical assistance, HOB elevated   General bed mobility comments: Max multi-modal cues to initiate movement but only trace effort from patient noted    Transfers Overall transfer level: Needs assistance Equipment used: 2 person hand held assist               General transfer comment: Pt made multiple visible efforts to come to standing but ultimately unable to clear the surface of the bed    Ambulation/Gait               General Gait Details: unable/unsafe to attempt   Stairs             Wheelchair Mobility     Tilt Bed    Modified Rankin (Stroke Patients Only)       Balance Overall balance assessment: Needs assistance   Sitting balance-Leahy Scale: Poor Sitting balance - Comments: Frequent assist needed to prevent posterior and R lateral LOB       Standing balance comment: Unable to stand                            Communication Communication Communication: Impaired Factors Affecting Communication: Hearing impaired;Difficulty expressing self  Cognition Arousal: Lethargic Behavior During Therapy: Flat affect   PT - Cognitive impairments: Difficult to assess Difficult to assess due to: Level of arousal  Following commands: Impaired Following commands impaired: Follows one step commands inconsistently    Cueing Cueing Techniques: Verbal cues, Gestural cues, Tactile cues, Visual cues  Exercises Total Joint Exercises Long Arc Quad: AROM, Both, 5 reps Other Exercises Other Exercises: Anterior weight shifting in sitting to address posterior instability    General Comments        Pertinent Vitals/Pain Pain Assessment Pain Assessment: PAINAD Breathing: normal Negative Vocalization:  occasional moan/groan, low speech, negative/disapproving quality Facial Expression: sad, frightened, frown Body Language: tense, distressed pacing, fidgeting Consolability: distracted or reassured by voice/touch PAINAD Score: 4 Pain Intervention(s): Repositioned, Premedicated before session, Monitored during session    Home Living                          Prior Function            PT Goals (current goals can now be found in the care plan section) Progress towards PT goals: PT to reassess next treatment    Frequency    Min 2X/week      PT Plan      Co-evaluation              AM-PAC PT "6 Clicks" Mobility   Outcome Measure  Help needed turning from your back to your side while in a flat bed without using bedrails?: Total Help needed moving from lying on your back to sitting on the side of a flat bed without using bedrails?: Total Help needed moving to and from a bed to a chair (including a wheelchair)?: Total Help needed standing up from a chair using your arms (e.g., wheelchair or bedside chair)?: Total Help needed to walk in hospital room?: Total Help needed climbing 3-5 steps with a railing? : Total 6 Click Score: 6    End of Session Equipment Utilized During Treatment: Gait belt Activity Tolerance: Patient limited by lethargy Patient left: in bed;with call bell/phone within reach;with bed alarm set;with family/visitor present Nurse Communication: Mobility status PT Visit Diagnosis: Muscle weakness (generalized) (M62.81);Difficulty in walking, not elsewhere classified (R26.2);Other abnormalities of gait and mobility (R26.89)     Time: 0981-1914 PT Time Calculation (min) (ACUTE ONLY): 19 min  Charges:    $Therapeutic Activity: 8-22 mins PT General Charges $$ ACUTE PT VISIT: 1 Visit                     D. Madalyn Scarce PT, DPT 06/05/23, 5:18 PM

## 2023-06-05 NOTE — Progress Notes (Signed)
 Awaiting blood transfusion. Called lab to confirm type and screen, notified that type and screen was received at 1200. Awaiting results and blood bank to prepare RBCs. Pt husband at bedside and aware.

## 2023-06-05 NOTE — Evaluation (Signed)
 Clinical/Bedside Swallow Evaluation Patient Details  Name: Marissa Huff MRN: [id number] Date of Birth: 1931/03/21  Today's Date: 06/05/2023 Time: SLP Start Time (ACUTE ONLY): 1010 SLP Stop Time (ACUTE ONLY): 1040 SLP Time Calculation (min) (ACUTE ONLY): 30 min  Past Medical History:  Past Medical History:  Diagnosis Date   Allergy    Arthritis    Carotid artery disease (HCC)    Carpal tunnel syndrome, bilateral    Chronic kidney disease, stage 2, mildly decreased GFR    Diabetes mellitus without complication (HCC)    Diverticulosis    Gastric lymphoma (HCC)    Gastroparesis    GERD (gastroesophageal reflux disease)    History of kidney stones    Hyperlipidemia    Hypertension associated with diabetes (HCC)    Hypothyroidism    Malignant lymphoma (HCC)    gastric   PUD (peptic ulcer disease)    Skin cancer    Spinal stenosis    lumbar laminectomy   Type 1 diabetes mellitus (HCC)    Urinary incontinence    Past Surgical History:  Past Surgical History:  Procedure Laterality Date   antrectomy and vagotomy  1994   CARPAL TUNNEL RELEASE Left 10/24/2008   CARPAL TUNNEL RELEASE Right 06/19/2009   CATARACT EXTRACTION, BILATERAL Bilateral 2014   COLONOSCOPY  05/24/2007   EYE SURGERY     bilateral cataracts   gastric lymphoma  1996   surgery   KNEE ARTHROPLASTY Left 07/06/2022   Procedure: COMPUTER ASSISTED TOTAL KNEE ARTHROPLASTY;  Surgeon: Arlyne Lame, MD;  Location: [facility] ORS;  Service: Orthopedics;  Laterality: Left;   KNEE ARTHROSCOPY Left 12/05/2008   KNEE ARTHROSCOPY Right 06/16/2010   LAPAROSCOPIC CHOLECYSTECTOMY  06/1999   LUMBAR LAMINECTOMY  1999   LUMBAR LAMINECTOMY WITH COFLEX 1 LEVEL N/A 09/15/2015   Procedure: Lumbar one-two Laminectomy with placement of coflex;  Surgeon: Elna Haggis, MD;  Location: [facility];  Service: Neurosurgery;  Laterality: N/A;   PARTIAL HYSTERECTOMY  02/1978   fibroid tumors, ovaries not removed   TONSILLECTOMY AND  ADENOIDECTOMY  1982   TRIGGER FINGER RELEASE Right 06/19/2009   HPI:  Marissa Huff is a 89 y.o. Caucasian female with medical history significant for carotid artery stenosis, osteoarthritis, diverticulosis, GERD, dyslipidemia, hypertension, hypothyroidism, PUD and type 1 diabetes mellitus, presented to the emergency room with acute onset of generalized weakness for the last few days with associated urinary frequency and urgency as well as altered mental status. CXR 5/31: No acute process.    Assessment / Plan / Recommendation  Clinical Impression  Pt seen for bedside swallow assessment in the setting of reduced level of alertness and difficulty with medication administration. Pt resting upon therapist entrance to room, with family (daughter and spouse) present. Pt would rouse for brief period of time- reach for cup- and then drift to sleep prior to completion of swallow. Given reduced level of alertness- PO trials not completed. Focus switched to education for family. Demonstration and verbal education provided for oral care and aspiration precautions. Suspect underlying dementia with acute AMS/delirium related to hospital stay and current side effects of infection are all impacting alertness/safety with PO intake. MD and RN aware of concern/risk for aspiration. MD reports plan to continue with diet with supervision and only when pt is alert. Recommend strict aspiration precautions (slow rate, small bites, elevated HOB, and alert for PO intake). SLP will follow to determine if further intervention is indicated.    SLP Visit Diagnosis: Dysphagia, unspecified (R13.10)  Aspiration Risk  Moderate aspiration risk    Diet Recommendation    (per MD plan to keep diet, with close monitoring for alertness/readiness for intake)  Medication Administration: Via alternative means (given current level of alertness)    Other  Recommendations Oral Care Recommendations: Oral care before and after PO     Recommendations for follow up therapy are one component of a multi-disciplinary discharge planning process, led by the attending physician.  Recommendations may be updated based on patient status, additional functional criteria and insurance authorization.  Follow up Recommendations Follow physician's recommendations for discharge plan and follow up therapies      Assistance Recommended at Discharge    Functional Status Assessment Patient has had a recent decline in their functional status and demonstrates the ability to make significant improvements in function in a reasonable and predictable amount of time. (suspect related to underlying dementia/hospital delirium/lethargy)  Frequency and Duration min 1 x/week  1 week       Prognosis Prognosis for improved oropharyngeal function: Fair Barriers to Reach Goals: Cognitive deficits      Swallow Study   General Date of Onset: 06/05/23 HPI: Marissa Huff is a 89 y.o. Caucasian female with medical history significant for carotid artery stenosis, osteoarthritis, diverticulosis, GERD, dyslipidemia, hypertension, hypothyroidism, PUD and type 1 diabetes mellitus, presented to the emergency room with acute onset of generalized weakness for the last few days with associated urinary frequency and urgency as well as altered mental status. CXR 5/31: No acute process. Type of Study: Bedside Swallow Evaluation Previous Swallow Assessment: none in chart Diet Prior to this Study: Regular;Thin liquids (Level 0) Temperature Spikes Noted: No (temp 98.6; WBC 6.9) Respiratory Status: Room air History of Recent Intubation: No Behavior/Cognition: Lethargic/Drowsy Oral Cavity Assessment: Within Functional Limits Oral Care Completed by SLP: Yes Oral Cavity - Dentition: Adequate natural dentition Vision:  (n/a) Self-Feeding Abilities:  (n/a) Patient Positioning: Upright in bed Baseline Vocal Quality: Not observed Volitional Cough: Cognitively unable to  elicit Volitional Swallow: Unable to elicit    Oral/Motor/Sensory Function Overall Oral Motor/Sensory Function:  (no focal impairment- limited assessment)   Ice Chips Ice chips: Not tested   Thin Liquid Thin Liquid: Not tested    Nectar Thick Nectar Thick Liquid: Not tested   Honey Thick Honey Thick Liquid: Not tested   Puree Puree: Not tested   Solid     Solid: Not tested     Marissa Saidi Santacroce Clapp, Marissa Huff, Marissa Huff Speech Language Pathologist Rehab Services; [facility] [place] [phone] (ascom)   Marissa Huff 06/05/2023,12:20 PM

## 2023-06-05 NOTE — Progress Notes (Signed)
 PROGRESS NOTE    Marissa Huff   NWG:[id number] DOB: 1931/05/16  DOA: 06/03/2023 Date of Service: 06/05/23 which is hospital day 2  PCP: Dellar Fenton, MD    Hospital course / significant events:   HPI: Marissa Huff is a 89 y.o. Caucasian female with medical history significant for carotid artery stenosis, osteoarthritis, diverticulosis, GERD, dyslipidemia, hypertension, hypothyroidism, PUD and type 1 diabetes mellitus, presented to the emergency room with acute onset of generalized weakness for the last few days with associated urinary frequency and urgency as well as altered mental status.   05/31: to ED from home. VSS. BUN of 55 and creatinine 1.98 above previous values and calcium  was 13 with blood glucose of 201. UA was positive for UTI. Started on Rocephin. Admitted to hospitalist service.  06/01: Cr 1.8. Working up hypercalcemia, anemia. Await UCx. 06/02: more lethargic today. UCx (+)Ecoli pending susceptibilities. Hgb stable still low, giving 1 unit PRBC. MRI brain pending, low suspicion for CVA but unable to perform adequate neuro exam d/t patient unable to participate. If CVA, would affect goals/options going forward, so will eval for this or other cause w/ MRI. D/w family re: nutrition, holding feeding tube for now as pt would certainly pull out NG. If not improving despite tx UTI and anemia, and if no other cause found, will assume progressive dementia and likely will need to involve palliative care. PTH pending.      Consultants:  none  Procedures/Surgeries: none      ASSESSMENT & PLAN:   Generalized weakness Metabolic encephalopathy d/t UTI, electrolyte derangement, anemia Complicated by underlying dementia - per husband worsening past few months  Secondary to UTI and possibly symptomatic anemia. B12 normal/high MRI brain pending, low suspicion for CVA but unable to perform adequate neuro exam d/t patient unable to participate. If CVA, would affect  goals/options going forward, so will eval for this or other cause w/ MRI.   Manage underlying cause(s) as below    Acute lower UTI UCx (+)Ecoli pending susceptibilities.  IV Rocephin  follow urine culture and sensitivity.   Symptomatic anemia Hgb on admission 7.3, was 12.0 four mos ago Hgb stable still low, giving 1 unit PRBC. stool occult blood. Ferritin, Iron / Retic normal Normal haptoglobin and LDH, hemolysis unlikely Occult blood stool negative follow H&H Transfusing 1 unit PRBC today  Consider HemOnc consult   Acute kidney injury superimposed on chronic kidney disease 3a - improving Baseline Cr 0.7-1.1, GFR 43-51 likely prerenal due to dehydration and volume depletion. IV normal saline to continue   follow BMP   Hypercalcemia Ionized Ca high 7.1 Normal Phos and VitD PTH pending - will guide further w/u Monitor BMP  Type 1 diabetes mellitus  Basal insulin  + SSI   Hx Hypothyroidism  Hyperthyroid labs here w/ low TSH 0.085  and slightly high free T4 4.59 Hold synthroid    Chronic back pain Tramadol  scheduled  Oxycodone  prn   Diarrhea - resolved Cdiff and GI PCR neg Imodium prn  Aspiration risk d/t encephalopathy D/w family re: nutrition, holding feeding tube for now as pt would certainly pull out NG.  If not improving despite tx UTI and anemia, and if no other cause found, will assume progressive dementia and likely will need to involve palliative care.   Class 1 obesity based on BMI: Body mass index is 31.9 kg/m.Aaron Aas Significantly low or high BMI is associated with higher medical risk.  Underweight - under 18  overweight - 25 to 29 obese -  30 or more Class 1 obesity: BMI of 30.0 to 34 Class 2 obesity: BMI of 35.0 to 39 Class 3 obesity: BMI of 40.0 to 49 Super Morbid Obesity: BMI 50-59 Super-super Morbid Obesity: BMI 60+ Healthy nutrition and physical activity advised as adjunct to other disease management and risk reduction treatments    DVT  prophylaxis: lovenox IV fluids: NS continuous IV fluids d/t AKI Nutrition: carb modified diet  Central lines / other devices: none  Code Status: FULL CODE ACP documentation reviewed:  none on file in [facility]  TOC needs: TBD Medical barriers to dispo: UTI, working up anemia and hypercalcemia. Expected medical readiness for discharge several days / pending results.              Subjective / Brief ROS:  Patient confused and very HoH and more lethargic today. Husband reports she is not eating.   Family Communication: husband and daughters are at bedside on rounds     Objective Findings:  Vitals:   06/05/23 0452 06/05/23 0750 06/05/23 1242 06/05/23 1651  BP: (!) 121/59 133/76 (!) 158/54 (!) 134/53  Pulse: (!) 103 76 97 94  Resp: 16 18 18 18   Temp: 98.6 F (37 C) 98.4 F (36.9 C) 98 F (36.7 C) 97.8 F (36.6 C)  TempSrc: Axillary  Oral   SpO2: 97% 98% 100% 95%  Weight:      Height:        Intake/Output Summary (Last 24 hours) at 06/05/2023 1735 Last data filed at 06/05/2023 1408 Gross per 24 hour  Intake 220 ml  Output 300 ml  Net -80 ml   Filed Weights   06/04/23 0034 06/04/23 0120  Weight: 78.9 kg 79.1 kg    Examination:  Physical Exam Constitutional:      General: She is not in acute distress.    Comments: Much more somnolent today but alerts to noxious stimuli  Cardiovascular:     Rate and Rhythm: Normal rate and regular rhythm.  Pulmonary:     Effort: Pulmonary effort is normal.     Breath sounds: Normal breath sounds.  Abdominal:     General: Abdomen is flat.     Palpations: Abdomen is soft.  Musculoskeletal:     Right lower leg: No edema.     Left lower leg: No edema.  Skin:    General: Skin is warm and dry.  Neurological:     Mental Status: She is disoriented.     Cranial Nerves: No cranial nerve deficit.     Motor: No weakness.          Scheduled Medications:   sodium chloride    Intravenous Once   busPIRone   5 mg Oral Daily    enoxaparin (LOVENOX) injection  30 mg Subcutaneous Q24H   fluticasone   2 spray Each Nare Daily   irbesartan   75 mg Oral Daily   loratadine   10 mg Oral Daily   multivitamin with minerals  1 tablet Oral Daily   pantoprazole   40 mg Oral BID   artificial tears  1 drop Both Eyes QHS   rosuvastatin   5 mg Oral Once per day on Monday Wednesday Friday   traMADol   50 mg Oral TID    Continuous Infusions:  sodium chloride  125 mL/hr at 06/05/23 1333   cefTRIAXone (ROCEPHIN)  IV Stopped (06/04/23 2135)    PRN Medications:  acetaminophen  **OR** acetaminophen , albuterol , dextromethorphan-guaiFENesin , LORazepam , magnesium  hydroxide, ondansetron  **OR** ondansetron  (ZOFRAN ) IV, oxyCODONE , traZODone  Antimicrobials from admission:  Anti-infectives (From  admission, onward)    Start     Dose/Rate Route Frequency Ordered Stop   06/04/23 2200  cefTRIAXone (ROCEPHIN) 1 g in sodium chloride  0.9 % 100 mL IVPB        1 g 200 mL/hr over 30 Minutes Intravenous Every 24 hours 06/04/23 0004     06/04/23 0045  cefTRIAXone (ROCEPHIN) 1 g in sodium chloride  0.9 % 100 mL IVPB  Status:  Discontinued        1 g 200 mL/hr over 30 Minutes Intravenous Every 24 hours 06/04/23 0031 06/04/23 0031   06/03/23 2330  cefTRIAXone (ROCEPHIN) 1 g in sodium chloride  0.9 % 100 mL IVPB        1 g 200 mL/hr over 30 Minutes Intravenous  Once 06/03/23 2323 06/04/23 0017           Data Reviewed:  I have personally reviewed the following...  CBC: Recent Labs  Lab 06/03/23 2225 06/04/23 0127 06/05/23 0359  WBC 5.9 5.6 6.9  NEUTROABS 4.1  --   --   HGB 7.3* 7.2* 7.2*  HCT 22.8* 21.7* 21.9*  MCV 97.4 96.4 96.9  PLT 181 164 158   Basic Metabolic Panel: Recent Labs  Lab 06/03/23 2225 06/04/23 0127 06/04/23 0927 06/05/23 0359  NA 136 137  --  140  K 4.3 4.0  --  3.8  CL 103 104  --  110  CO2 22 24  --  23  GLUCOSE 201* 185*  --  131*  BUN 55* 53*  --  40*  CREATININE 1.98* 1.80*  --  1.35*  CALCIUM  13.0* 12.7*   --  12.4*  PHOS  --   --  4.4  --    GFR: Estimated Creatinine Clearance: 27 mL/min (A) (by C-G formula based on SCr of 1.35 mg/dL (H)). Liver Function Tests: Recent Labs  Lab 06/05/23 0359  AST 15  ALT 20  ALKPHOS 51  BILITOT 0.7  PROT 6.0*  ALBUMIN 3.3*   No results for input(s): "LIPASE", "AMYLASE" in the last 168 hours. No results for input(s): "AMMONIA" in the last 168 hours. Coagulation Profile: No results for input(s): "INR", "PROTIME" in the last 168 hours. Cardiac Enzymes: No results for input(s): "CKTOTAL", "CKMB", "CKMBINDEX", "TROPONINI" in the last 168 hours. BNP (last 3 results) No results for input(s): "PROBNP" in the last 8760 hours. HbA1C: No results for input(s): "HGBA1C" in the last 72 hours. CBG: Recent Labs  Lab 06/04/23 1940 06/05/23 0744 06/05/23 0948 06/05/23 1244 06/05/23 1645  GLUCAP 143* 135* 143* 137* 156*   Lipid Profile: No results for input(s): "CHOL", "HDL", "LDLCALC", "TRIG", "CHOLHDL", "LDLDIRECT" in the last 72 hours. Thyroid  Function Tests: Recent Labs    06/04/23 0127 06/04/23 0543  TSH  --  0.085*  FREET4 4.59*  --    Anemia Panel: Recent Labs    06/04/23 0543  VITAMINB12 4,204*  FOLATE 14.2  FERRITIN 137  TIBC 318  IRON 42  RETICCTPCT 1.9   Most Recent Urinalysis On File:     Component Value Date/Time   COLORURINE YELLOW (A) 06/03/2023 2225   APPEARANCEUR CLOUDY (A) 06/03/2023 2225   LABSPEC 1.013 06/03/2023 2225   PHURINE 5.0 06/03/2023 2225   GLUCOSEU NEGATIVE 06/03/2023 2225   GLUCOSEU NEGATIVE 10/11/2022 1007   HGBUR NEGATIVE 06/03/2023 2225   BILIRUBINUR NEGATIVE 06/03/2023 2225   BILIRUBINUR negative 08/22/2016 0958   KETONESUR NEGATIVE 06/03/2023 2225   PROTEINUR 30 (A) 06/03/2023 2225   UROBILINOGEN 0.2 10/11/2022 1007  NITRITE NEGATIVE 06/03/2023 2225   LEUKOCYTESUR MODERATE (A) 06/03/2023 2225   Sepsis Labs: @LABRCNTIP (procalcitonin:4,lacticidven:4) Microbiology: Recent Results (from the  past 240 hours)  Urine Culture (for pregnant, neutropenic or urologic patients or patients with an indwelling urinary catheter)     Status: Abnormal (Preliminary result)   Collection Time: 06/03/23 10:50 PM   Specimen: Urine, Random  Result Value Ref Range Status   Specimen Description   Final    URINE, RANDOM Performed at [facility], [address], [place], [place] [address]    Special Requests   Final    NONE Performed at [facility], [address] [place], [place] [address]    Culture >=100,000 COLONIES/mL ESCHERICHIA COLI (A)  Final   Report Status PENDING  Incomplete  C Difficile Quick Screen w PCR reflex     Status: None   Collection Time: 06/04/23  5:35 PM   Specimen: STOOL  Result Value Ref Range Status   C Diff antigen NEGATIVE NEGATIVE Final   C Diff toxin NEGATIVE NEGATIVE Final   C Diff interpretation No C. difficile detected.  Final    Comment: Performed at [facility], [address] [place], [place] [address]  Gastrointestinal Panel by PCR , Stool     Status: None   Collection Time: 06/04/23  5:35 PM   Specimen: Stool  Result Value Ref Range Status   Campylobacter species NOT DETECTED NOT DETECTED Final   Plesimonas shigelloides NOT DETECTED NOT DETECTED Final   Salmonella species NOT DETECTED NOT DETECTED Final   Yersinia enterocolitica NOT DETECTED NOT DETECTED Final   Vibrio species NOT DETECTED NOT DETECTED Final   Vibrio cholerae NOT DETECTED NOT DETECTED Final   Enteroaggregative E coli (EAEC) NOT DETECTED NOT DETECTED Final   Enteropathogenic E coli (EPEC) NOT DETECTED NOT DETECTED Final   Enterotoxigenic E coli (ETEC) NOT DETECTED NOT DETECTED Final   Shiga like toxin producing E coli (STEC) NOT DETECTED NOT DETECTED Final   Shigella/Enteroinvasive E coli (EIEC) NOT DETECTED NOT DETECTED Final   Cryptosporidium NOT DETECTED NOT DETECTED Final   Cyclospora cayetanensis NOT DETECTED NOT DETECTED Final   Entamoeba  histolytica NOT DETECTED NOT DETECTED Final   Giardia lamblia NOT DETECTED NOT DETECTED Final   Adenovirus F40/41 NOT DETECTED NOT DETECTED Final   Astrovirus NOT DETECTED NOT DETECTED Final   Norovirus GI/GII NOT DETECTED NOT DETECTED Final   Rotavirus A NOT DETECTED NOT DETECTED Final   Sapovirus (I, II, IV, and V) NOT DETECTED NOT DETECTED Final    Comment: Performed at [facility], [address], [place], [place] [address]      Radiology Studies last 3 days: DG Chest Portable 1 View Result Date: 06/03/2023 EXAM: 1 VIEW XRAY OF THE CHEST 06/03/2023 10:51:04 PM COMPARISON: 10/28/2021 CLINICAL HISTORY: Weakness. PER ER NOTE; BIB ems from home for weakness and altered mental status has been going on for a week and has just been getting worse per husband; Per ems "husband thinks it is dementia but unsure" FINDINGS: LUNGS AND PLEURA: No focal pulmonary opacity. No pulmonary edema. No pleural effusion. No pneumothorax. HEART AND MEDIASTINUM: No acute abnormality of the cardiac and mediastinal silhouettes. BONES AND SOFT TISSUES: No acute osseous abnormality. IMPRESSION: 1. No acute process. Electronically signed by: Zadie Herter MD 06/03/2023 10:52 PM EDT RP Workstation: [id number]         Melodi Sprung, DO [facility] 06/05/2023, 5:35 PM    Dictation software may have been used  to generate the above note. Typos may occur and escape review in typed/dictated notes. Please contact Dr Authur Leghorn directly for clarity if needed.  Staff may message me via secure chat in Epic  but this may not receive an immediate response,  please page me for urgent matters!  If 7PM-7AM, please contact night coverage [email or web address]

## 2023-06-05 NOTE — TOC Progression Note (Signed)
 Transition of Care [facility]) - Progression Note    Patient Details  Name: Marissa Huff MRN: [id number] Date of Birth: 03/18/1931  [facility] CM/SW Contact  Elsie Halo, RN Phone Number: 06/05/2023, 4:14 PM  Clinical Narrative:     TOC spoke with the patient's family in the room. TOC explained that therapy is recommending SNF. The family has not decided on whether or not they wish to pursue SNF placement for str at this time. They are waiting to speak with the MD before they will make a decision. TOC asked the family to f/u.  TOC will continue to follow.   Barriers to Discharge: Continued Medical Work up  Expected Discharge Plan and Services       Living arrangements for the past 2 months: Single Family Home                                       Social Determinants of Health (SDOH) Interventions SDOH Screenings   Food Insecurity: No Food Insecurity (06/04/2023)  Housing: Low Risk  (06/04/2023)  Transportation Needs: No Transportation Needs (06/04/2023)  Utilities: Not At Risk (06/04/2023)  Alcohol Screen: Low Risk  (12/05/2022)  Depression (PHQ2-9): Low Risk  (05/19/2023)  Financial Resource Strain: Low Risk  (12/05/2022)  Physical Activity: Inactive (12/05/2022)  Social Connections: Moderately Integrated (06/04/2023)  Stress: Stress Concern Present (12/05/2022)  Tobacco Use: Low Risk  (06/04/2023)  Health Literacy: Adequate Health Literacy (12/05/2022)    Readmission Risk Interventions     No data to display

## 2023-06-05 NOTE — Progress Notes (Signed)
 Occupational Therapy Evaluation Patient Details Name: Marissa Huff MRN: [id number] DOB: 1931/10/03 Today's Date: 06/05/2023   History of Present Illness   Marissa Huff is a 89 y.o. Caucasian female with medical history significant for carotid artery stenosis, osteoarthritis, diverticulosis, GERD, dyslipidemia, hypertension, hypothyroidism, PUD and type 1 diabetes mellitus, presented to the emergency room with acute onset of generalized weakness for the last few days with associated urinary frequency and urgency as well as altered mental status.     Clinical Impressions Marissa Huff was seen for OT evaluation this date. Prior to hospital admission, pt demonstrated recent decline in cognition and was MIN-MOD A in ADLs. Pt lives with spouse. Pt presents to acute OT demonstrating impaired ADL performance and functional mobility 2/2 fatigue, generalized weakness, and decreased cognition (See OT problem list for additional functional deficits). Pt currently requires TOT A for pericare and donning depends, and TOT A x 2 supine<>sitting. Pt requires MAX A w/ intermittent +2 for static sitting EOB for ~ 2 min.  Pt would benefit from skilled OT services to address noted impairments and functional limitations (see below for any additional details) in order to maximize safety and independence while minimizing falls risk and caregiver burden. Anticipate the need for follow up OT services upon acute hospital DC.      If plan is discharge home, recommend the following:   Two people to help with walking and/or transfers;Two people to help with bathing/dressing/bathroom;Assistance with cooking/housework;Assist for transportation     Functional Status Assessment   Patient has had a recent decline in their functional status and demonstrates the ability to make significant improvements in function in a reasonable and predictable amount of time.     Equipment Recommendations   Other (comment) (Defer to  next venue of care)     Recommendations for Other Services         Precautions/Restrictions   Precautions Precautions: Fall Recall of Precautions/Restrictions: Impaired Restrictions Weight Bearing Restrictions Per Provider Order: No     Mobility Bed Mobility Overal bed mobility: Needs Assistance Bed Mobility: Rolling, Supine to Sit, Sit to Supine Rolling: Max assist, +2 for physical assistance   Supine to sit: Total assist, HOB elevated, +2 for physical assistance Sit to supine: Total assist, +2 for physical assistance, HOB elevated        Transfers                   General transfer comment: Not tested      Balance Overall balance assessment: Needs assistance Sitting-balance support: Feet supported, Single extremity supported Sitting balance-Leahy Scale: Poor Sitting balance - Comments: Required MAX x2 to sit EOB                                   ADL either performed or assessed with clinical judgement   ADL Overall ADL's : Needs assistance/impaired                                       General ADL Comments: TOT A for pericare and donning depends     Vision         Perception         Praxis         Pertinent Vitals/Pain Pain Assessment Pain Assessment: PAINAD Breathing: normal Facial Expression: facial grimacing Body Language:  tense, distressed pacing, fidgeting Consolability: distracted or reassured by voice/touch Pain Intervention(s): Limited activity within patient's tolerance, Monitored during session, Repositioned     Extremity/Trunk Assessment Upper Extremity Assessment Upper Extremity Assessment: Generalized weakness   Lower Extremity Assessment Lower Extremity Assessment: Generalized weakness       Communication Communication Communication: Impaired Factors Affecting Communication: Hearing impaired;Difficulty expressing self   Cognition Arousal: Lethargic Behavior During Therapy:  Flat affect Cognition: Cognition impaired                               Following commands: Impaired Following commands impaired: Follows one step commands inconsistently, Follows one step commands with increased time     Cueing  General Comments   Cueing Techniques: Verbal cues;Gestural cues;Tactile cues      Exercises     Shoulder Instructions      Home Living Family/patient expects to be discharged to:: Private residence Living Arrangements: Spouse/significant other Available Help at Discharge: Family;Available 24 hours/day Type of Home: House Home Access: Stairs to enter [employer] of Steps: 3 Entrance Stairs-Rails: Right;Left Home Layout: Two level;Able to live on main level with bedroom/bathroom     Bathroom Shower/Tub: Producer, television/film/video: Handicapped height Bathroom Accessibility: Yes How Accessible: Accessible via walker Home Equipment: Rolling Walker (2 wheels);BSC/3in1;Cane - single point          Prior Functioning/Environment Prior Level of Function : Needs assist               ADLs Comments: Spouse at bedside has reported gradual decline in cognition specifically Short term memory needing mroe assist with mobility, medication management. Doesn't leave house much due to difficulty completing car transfers. Now assisting with ADL's. Wears depends at home for urinary incontinence.    OT Problem List: Decreased strength;Decreased activity tolerance;Impaired balance (sitting and/or standing);Decreased cognition;Decreased safety awareness   OT Treatment/Interventions: Self-care/ADL training      OT Goals(Current goals can be found in the care plan section)   Acute Rehab OT Goals Patient Stated Goal: to go home OT Goal Formulation: With family Time For Goal Achievement: 06/19/23 Potential to Achieve Goals: Fair ADL Goals Pt Will Perform Grooming: with min assist;sitting Pt Will Perform Lower Body Dressing:  with min assist;sitting/lateral leans;bed level Pt Will Transfer to Toilet: with mod assist;stand pivot transfer;bedside commode   OT Frequency:  Min 2X/week    Co-evaluation              AM-PAC OT "6 Clicks" Daily Activity     Outcome Measure Help from another person eating meals?: A Lot Help from another person taking care of personal grooming?: A Lot Help from another person toileting, which includes using toliet, bedpan, or urinal?: Total Help from another person bathing (including washing, rinsing, drying)?: Total Help from another person to put on and taking off regular upper body clothing?: A Lot Help from another person to put on and taking off regular lower body clothing?: Total 6 Click Score: 9   End of Session Nurse Communication: Other (comment) (BM/loose stool)  Activity Tolerance: Patient limited by fatigue;Patient limited by lethargy Patient left: in bed;with call bell/phone within reach;with bed alarm set;with family/visitor present  OT Visit Diagnosis: Unsteadiness on feet (R26.81);Other abnormalities of gait and mobility (R26.89);Muscle weakness (generalized) (M62.81);Other symptoms and signs involving cognitive function                Time: 4540-9811 OT Time Calculation (  min): 29 min Charges:  OT General Charges $OT Visit: 1 Visit OT Evaluation $OT Eval Moderate Complexity: 1 Mod OT Treatments $Self Care/Home Management : 8-22 mins  Stevenson Elbe, Student OT   [employer] 06/05/2023, 10:42 AM

## 2023-06-06 DIAGNOSIS — R531 Weakness: Secondary | ICD-10-CM | POA: Diagnosis not present

## 2023-06-06 LAB — COMPREHENSIVE METABOLIC PANEL WITH GFR
ALT: 29 U/L (ref 0–44)
AST: 27 U/L (ref 15–41)
Albumin: 3.3 g/dL — ABNORMAL LOW (ref 3.5–5.0)
Alkaline Phosphatase: 58 U/L (ref 38–126)
Anion gap: 13 (ref 5–15)
BUN: 38 mg/dL — ABNORMAL HIGH (ref 8–23)
CO2: 19 mmol/L — ABNORMAL LOW (ref 22–32)
Calcium: 12.3 mg/dL — ABNORMAL HIGH (ref 8.9–10.3)
Chloride: 114 mmol/L — ABNORMAL HIGH (ref 98–111)
Creatinine, Ser: 1.21 mg/dL — ABNORMAL HIGH (ref 0.44–1.00)
GFR, Estimated: 43 mL/min — ABNORMAL LOW (ref >60)
Glucose, Bld: 221 mg/dL — ABNORMAL HIGH (ref 70–99)
Potassium: 4.1 mmol/L (ref 3.5–5.1)
Sodium: 146 mmol/L — ABNORMAL HIGH (ref 135–145)
Total Bilirubin: 1.2 mg/dL (ref 0.0–1.2)
Total Protein: 6.3 g/dL — ABNORMAL LOW (ref 6.5–8.1)

## 2023-06-06 LAB — GLUCOSE, CAPILLARY
Glucose-Capillary: 257 mg/dL — ABNORMAL HIGH (ref 70–99)
Glucose-Capillary: 272 mg/dL — ABNORMAL HIGH (ref 70–99)
Glucose-Capillary: 276 mg/dL — ABNORMAL HIGH (ref 70–99)
Glucose-Capillary: 232 mg/dL — ABNORMAL HIGH (ref 70–99)

## 2023-06-06 LAB — CBC
HCT: 26.5 % — ABNORMAL LOW (ref 36.0–46.0)
Hemoglobin: 8.4 g/dL — ABNORMAL LOW (ref 12.0–15.0)
MCH: 30.8 pg (ref 26.0–34.0)
MCHC: 31.7 g/dL (ref 30.0–36.0)
MCV: 97.1 fL (ref 80.0–100.0)
Platelets: 201 10*3/uL (ref 150–400)
RBC: 2.73 MIL/uL — ABNORMAL LOW (ref 3.87–5.11)
RDW: 17.5 % — ABNORMAL HIGH (ref 11.5–15.5)
WBC: 10 10*3/uL (ref 4.0–10.5)
nRBC: 0 % (ref 0.0–0.2)

## 2023-06-06 LAB — TECHNOLOGIST SMEAR REVIEW: Plt Morphology: NORMAL

## 2023-06-06 LAB — TYPE AND SCREEN
ABO/RH(D): O NEG
Antibody Screen: NEGATIVE
Unit division: 0

## 2023-06-06 LAB — BPAM RBC
Blood Product Expiration Date: 202506282359
ISSUE DATE / TIME: 202506021827
Unit Type and Rh: 5100

## 2023-06-06 MED ORDER — SODIUM CHLORIDE 0.9 % IV SOLN: INTRAVENOUS | Status: AC

## 2023-06-06 MED ORDER — CARVEDILOL 6.25 MG PO TABS: 6.2500 mg | ORAL_TABLET | Freq: Two times a day (BID) | ORAL | Status: DC

## 2023-06-06 MED ORDER — SODIUM CHLORIDE 0.9 % IV SOLN: INTRAVENOUS | Status: DC

## 2023-06-06 MED ORDER — INSULIN ASPART 100 UNIT/ML IJ SOLN
0.0000 [IU] | INTRAMUSCULAR | Status: DC
  Administered 2023-06-06: 3 [IU] via SUBCUTANEOUS
  Administered 2023-06-06: 2 [IU] via SUBCUTANEOUS
  Administered 2023-06-06 (×2): 5 [IU] via SUBCUTANEOUS
  Administered 2023-06-07 (×2): 2 [IU] via SUBCUTANEOUS
  Filled 2023-06-06 (×5): qty 1

## 2023-06-06 MED ORDER — INSULIN GLARGINE-YFGN 100 UNIT/ML ~~LOC~~ SOLN
5.0000 [IU] | Freq: Every day | SUBCUTANEOUS | Status: DC
  Administered 2023-06-06: 5 [IU] via SUBCUTANEOUS
  Filled 2023-06-06 (×2): qty 0.05

## 2023-06-06 MED ORDER — SODIUM CHLORIDE 0.9 % IV BOLUS
500.0000 mL | Freq: Once | INTRAVENOUS | Status: AC
  Administered 2023-06-06: 500 mL via INTRAVENOUS

## 2023-06-06 MED ORDER — LABETALOL HCL 5 MG/ML IV SOLN
10.0000 mg | Freq: Once | INTRAVENOUS | Status: AC
  Administered 2023-06-06: 10 mg via INTRAVENOUS
  Filled 2023-06-06: qty 4

## 2023-06-06 NOTE — Progress Notes (Signed)
 PT Cancellation Note  Patient Details Name: Marissa Huff MRN: [id number] DOB: Dec 17, 1931   Cancelled Treatment:    Reason Eval/Treat Not Completed: Fatigue/lethargy limiting ability to participate. Pt was heavily sleeping upon entering pt room. Pt's spouse present and declined therapy for pt for today.  Explained to pt's spouse the PT POC and pt's spouse verbalized understanding and agreeable for PT to follow up with pt another day.   Eliazar Gross, PTA  06/06/23, 11:16 AM

## 2023-06-06 NOTE — Consult Note (Addendum)
 Hematology/Oncology Consult note [facility] [phone] [phone]  Patient Care Team: Dellar Fenton, MD as PCP - General (Internal Medicine)   Name of the patient: Marissa Huff  [id number]  February 18, 1931    Reason for consult: Anemia   Requesting physician: Dr. Melodi Sprung  Date of visit: 06/06/2023    History of presenting illness-patient is a 89 year old female with a past medical history significant for type 1 diabetes, CKD,, Osteoarthritis, GERD who presented to the ER with symptoms of generalized weakness.  Looking at her prior CBCs patient's hemoglobin was stable around 12 up until January 2025.  She was noted to have an H&H of 7.3/22.8 when she was admitted on 06/03/2023.  White count normal at 5.9 with a platelet count of 181.  Workup for anemia so far shows ferritin level of 137 and iron saturation of 13%.  LDH normal at 103.  B12 levels elevated at 4200.  Folate normal.  Haptoglobin normal.  Reticulocyte count low at 1.9 suggestive of hypoproliferative anemia.  TSH low at 0.085 and free T4 elevated at 4.5.  She was noted to have normal serum calcium  of 9.7 up until 2 weeks ago but she was admitted to the hospital for hypercalcemia with a calcium  level of 13 and AKI with a serum creatinine of 1.9.  Both serum calcium  and serum creatinine has improved so far.  Serum calcium  has not yet normalized.  PTH RP is currently pending. Patient underwent MRI brain with and without contrast which showed diffuse heterogeneous bone marrow with patchy enhancement which could be nonspecific but could be related to metabolic derangement or multiple myeloma.   ECOG PS- 4   Review of systems- Review of Systems  Unable to perform ROS: Mental acuity    Allergies  Allergen Reactions   Penicillins Rash    IgE = 10 (WNL) on 06/28/2022    Patient Active Problem List   Diagnosis Date Noted   Acute lower UTI 06/04/2023   Symptomatic anemia 06/04/2023    Type 1 diabetes mellitus with chronic kidney disease (HCC) 06/04/2023   Acute kidney injury superimposed on chronic kidney disease (HCC) 06/04/2023   Generalized weakness 06/03/2023   Muscle cramps 07/29/2022   Total knee replacement status 07/06/2022   Pre-op evaluation 07/04/2022   Primary osteoarthritis of left knee 06/19/2022   Memory change 03/02/2022   Anxiety 03/06/2021   Fall 10/13/2020   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   Sinusitis 12/30/2019   Macular degeneration 10/01/2018   Carotid artery disease, unspecified laterality (HCC) 02/23/2018   Right carotid bruit 02/04/2018   UPJ obstruction, congenital 07/26/2016   Lumbar stenosis with neurogenic claudication 09/15/2015   Nephrolithiasis 06/28/2015   Persistent cough 12/02/2014   Health care maintenance 05/11/2014   Obesity (BMI 30-39.9) 01/11/2014   GERD (gastroesophageal reflux disease) 01/11/2014   Environmental allergies 01/11/2014   CKD stage 3a, GFR 45-59 ml/min (HCC) 10/23/2013   Hydronephrosis 08/26/2013   Constipation 08/05/2013   DDD (degenerative disc disease), lumbar 07/15/2013   Lumbar radiculitis 05/10/2013   Right shoulder pain 05/10/2013   Anemia 04/15/2013   Carpal tunnel syndrome 04/15/2013   Trigger finger 04/15/2013   Hyperlipidemia due to type 1 diabetes mellitus (HCC) 04/15/2013   Microscopic hematuria 09/20/2012   Incomplete emptying of bladder 02/27/2012   Mixed urge and stress incontinence 02/27/2012   Symptoms involving urinary system 02/27/2012   Chronic cystitis 02/27/2012   Recurrent UTI 01/27/2012   History of lymphoma 12/12/2011   Spinal stenosis  12/12/2011   Diverticulosis 12/12/2011   Gastroparesis 12/12/2011   Peptic ulcer disease 12/12/2011   Hypothyroidism 12/12/2011   Hypercholesterolemia 12/12/2011   Hypertension 12/12/2011   Type 1 diabetes mellitus with hyperglycemia (HCC) 12/12/2011   Diverticulosis of colon 12/12/2011   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) 12/12/2011     Past Medical History:  Diagnosis Date   Allergy    Arthritis    Carotid artery disease (HCC)    Carpal tunnel syndrome, bilateral    Chronic kidney disease, stage 2, mildly decreased GFR    Diabetes mellitus without complication (HCC)    Diverticulosis    Gastric lymphoma (HCC)    Gastroparesis    GERD (gastroesophageal reflux disease)    History of kidney stones    Hyperlipidemia    Hypertension associated with diabetes (HCC)    Hypothyroidism    Malignant lymphoma (HCC)    gastric   PUD (peptic ulcer disease)    Skin cancer    Spinal stenosis    lumbar laminectomy   Type 1 diabetes mellitus (HCC)    Urinary incontinence      Past Surgical History:  Procedure Laterality Date   antrectomy and vagotomy  1994   CARPAL TUNNEL RELEASE Left 10/24/2008   CARPAL TUNNEL RELEASE Right 06/19/2009   CATARACT EXTRACTION, BILATERAL Bilateral 2014   COLONOSCOPY  05/24/2007   EYE SURGERY     bilateral cataracts   gastric lymphoma  1996   surgery   KNEE ARTHROPLASTY Left 07/06/2022   Procedure: COMPUTER ASSISTED TOTAL KNEE ARTHROPLASTY;  Surgeon: Arlyne Lame, MD;  Location: [facility] ORS;  Service: Orthopedics;  Laterality: Left;   KNEE ARTHROSCOPY Left 12/05/2008   KNEE ARTHROSCOPY Right 06/16/2010   LAPAROSCOPIC CHOLECYSTECTOMY  06/1999   LUMBAR LAMINECTOMY  1999   LUMBAR LAMINECTOMY WITH COFLEX 1 LEVEL N/A 09/15/2015   Procedure: Lumbar one-two Laminectomy with placement of coflex;  Surgeon: Elna Haggis, MD;  Location: [facility];  Service: Neurosurgery;  Laterality: N/A;   PARTIAL HYSTERECTOMY  02/1978   fibroid tumors, ovaries not removed   TONSILLECTOMY AND ADENOIDECTOMY  1982   TRIGGER FINGER RELEASE Right 06/19/2009    Social History   Socioeconomic History   Marital status: Married    Spouse name: Charles   Number of children: 2   Years of education: Not on file   Highest education level: Not on file  Occupational History   Not on  file  Tobacco Use   Smoking status: Never   Smokeless tobacco: Never  Vaping Use   Vaping status: Never Used  Substance and Sexual Activity   Alcohol use: No    Alcohol/week: 0.0 standard drinks of alcohol   Drug use: No   Sexual activity: Not Currently    Birth control/protection: Surgical  Other Topics Concern   Not on file  Social History Narrative   married   Social Drivers of Health   Financial Resource Strain: Low Risk  (12/05/2022)   Overall Financial Resource Strain (CARDIA)    Difficulty of Paying Living Expenses: Not hard at all  Food Insecurity: No Food Insecurity (06/04/2023)   Hunger Vital Sign    Worried About Running Out of Food in the Last Year: Never true    Ran Out of Food in the Last Year: Never true  Transportation Needs: No Transportation Needs (06/04/2023)   PRAPARE - Administrator, Civil Service (Medical): No    Lack of Transportation (Non-Medical): No  Physical Activity: Inactive (12/05/2022)   Exercise Vital Sign    Days of Exercise per Week: 0 days    Minutes of Exercise per Session: 0 min  Stress: Stress Concern Present (12/05/2022)   [employer] of Occupational Health - Occupational Stress Questionnaire    Feeling of Stress : To some extent  Social Connections: Moderately Integrated (06/04/2023)   Social Connection and Isolation Panel [NHANES]    Frequency of Communication with Friends and Family: Three times a week    Frequency of Social Gatherings with Friends and Family: Three times a week    Attends Religious Services: More than 4 times per year    Active Member of Clubs or Organizations: No    Attends Banker Meetings: Never    Marital Status: Married  Catering manager Violence: Not At Risk (06/04/2023)   Humiliation, Afraid, Rape, and Kick questionnaire    Fear of Current or Ex-Partner: No    Emotionally Abused: No    Physically Abused: No    Sexually Abused: No     Family History  Problem Relation Age of  Onset   Stroke Mother    Stroke Father    Hypertension Father    Diabetes Brother    Multiple sclerosis Brother    Breast cancer Daughter 34   Colon cancer Neg Hx      Current Facility-Administered Medications:    0.9 %  sodium chloride  infusion, , Intravenous, Continuous, Alexander, Natalie, DO   acetaminophen  (TYLENOL ) tablet 650 mg, 650 mg, Oral, Q6H PRN, 650 mg at 06/05/23 0636 **OR** acetaminophen  (TYLENOL ) suppository 650 mg, 650 mg, Rectal, Q6H PRN, Mansy, Jan A, MD   albuterol  (PROVENTIL ) (2.5 MG/3ML) 0.083% nebulizer solution 2.5 mg, 2.5 mg, Nebulization, Q6H PRN, Coretta Dexter, RPH   cefTRIAXone (ROCEPHIN) 1 g in sodium chloride  0.9 % 100 mL IVPB, 1 g, Intravenous, Q24H, Mansy, Jan A, MD, Stopped at 06/05/23 2247   dextromethorphan-guaiFENesin  (MUCINEX  DM) 30-600 MG per 12 hr tablet 1 tablet, 1 tablet, Oral, Daily PRN, Mansy, Jan A, MD   enoxaparin (LOVENOX) injection 30 mg, 30 mg, Subcutaneous, Q24H, Mansy, Jan A, MD, 30 mg at 06/06/23 1610   fluticasone  (FLONASE ) 50 MCG/ACT nasal spray 2 spray, 2 spray, Each Nare, Daily, Mansy, Jan A, MD   insulin  aspart (novoLOG ) injection 0-9 Units, 0-9 Units, Subcutaneous, Q4H, Alexander, Natalie, DO, 5 Units at 06/06/23 1300   insulin  glargine-yfgn (SEMGLEE ) injection 5 Units, 5 Units, Subcutaneous, Daily, Alexander, Natalie, DO, 5 Units at 06/06/23 1420   magnesium  hydroxide (MILK OF MAGNESIA) suspension 30 mL, 30 mL, Oral, Daily PRN, Mansy, Jan A, MD   morphine  (PF) 2 MG/ML injection 1-2 mg, 1-2 mg, Intravenous, Q4H PRN, Mansy, Jan A, MD, 2 mg at 06/06/23 9604   ondansetron  (ZOFRAN ) tablet 4 mg, 4 mg, Oral, Q6H PRN **OR** ondansetron  (ZOFRAN ) injection 4 mg, 4 mg, Intravenous, Q6H PRN, Mansy, Jan A, MD   pantoprazole  (PROTONIX ) injection 40 mg, 40 mg, Intravenous, Q24H, Melodi Sprung, DO, 40 mg at 06/06/23 5409   polyvinyl alcohol (LIQUIFILM TEARS) 1.4 % ophthalmic solution 1 drop, 1 drop, Both Eyes, QHS, Mansy, Jan A, MD, 1 drop at  06/05/23 2218   Physical exam:  Vitals:   06/05/23 2045 06/06/23 0529 06/06/23 0731 06/06/23 1249  BP: (!) 119/94 (!) 154/78 (!) 158/68 (!) 156/87  Pulse: 68 88 (!) 105 (!) 105  Resp: 17  18 17   Temp: 98 F (36.7 C) 97.8 F (36.6 C) 98.2 F (36.8  C) 98.2 F (36.8 C)  TempSrc:  Oral    SpO2: 95% 93% 93% 91%  Weight:      Height:       Physical Exam Constitutional:      Comments: Unresponsive  Cardiovascular:     Rate and Rhythm: Regular rhythm.     Heart sounds: Normal heart sounds.  Pulmonary:     Effort: Pulmonary effort is normal.     Breath sounds: Normal breath sounds.  Abdominal:     General: Bowel sounds are normal.     Palpations: Abdomen is soft.  Skin:    General: Skin is warm and dry.  Neurological:     Mental Status: She is alert. She is disoriented.     Comments: Unresponsive to verbal commands           Latest Ref Rng & Units 06/06/2023    4:05 AM  CMP  Glucose 70 - 99 mg/dL 161   BUN 8 - 23 mg/dL 38   Creatinine 0.96 - 1.00 mg/dL 0.45   Sodium 409 - 811 mmol/L 146   Potassium 3.5 - 5.1 mmol/L 4.1   Chloride 98 - 111 mmol/L 114   CO2 22 - 32 mmol/L 19   Calcium  8.9 - 10.3 mg/dL 91.4   Total Protein 6.5 - 8.1 g/dL 6.3   Total Bilirubin 0.0 - 1.2 mg/dL 1.2   Alkaline Phos 38 - 126 U/L 58   AST 15 - 41 U/L 27   ALT 0 - 44 U/L 29       Latest Ref Rng & Units 06/06/2023    4:05 AM  CBC  WBC 4.0 - 10.5 K/uL 10.0   Hemoglobin 12.0 - 15.0 g/dL 8.4   Hematocrit 78.2 - 46.0 % 26.5   Platelets 150 - 400 K/uL 201     @IMAGES @  MR BRAIN W WO CONTRAST Result Date: 06/05/2023 CLINICAL DATA:  Mental status change, unknown cause EXAM: MRI HEAD WITHOUT AND WITH CONTRAST TECHNIQUE: Multiplanar, multiecho pulse sequences of the brain and surrounding structures were obtained without and with intravenous contrast. CONTRAST:  7mL GADAVIST GADOBUTROL 1 MMOL/ML IV SOLN COMPARISON:  CT head March 19, 2020. MRI head March 27, 2015. FINDINGS: Brain: No acute  infarction, hemorrhage, hydrocephalus, extra-axial collection or mass lesion. Cerebral atrophy. Patchy T2/FLAIR hyperintensities in the white matter, nonspecific but compatible with chronic microvascular ischemic disease. Vascular: Major arterial flow voids are maintained. Skull and upper cervical spine: Diffusely heterogeneous bone marrow with patchy enhancement. Sinuses/Orbits: Clear sinuses.  No acute findings. Other: Major arterial flow voids are maintained. IMPRESSION: 1. No evidence of acute intracranial abnormality. 2. Diffusely heterogeneous bone marrow with patchy enhancement, nonspecific but potentially related to metabolic derangement or multiple myeloma. Diffuse osseous metastatic disease is a differential consideration but thought less likely in the absence of a known primary malignancy. Electronically Signed   By: Stevenson Elbe M.D.   On: 06/05/2023 20:03   DG Chest Portable 1 View Result Date: 06/03/2023 EXAM: 1 VIEW XRAY OF THE CHEST 06/03/2023 10:51:04 PM COMPARISON: 10/28/2021 CLINICAL HISTORY: Weakness. PER ER NOTE; BIB ems from home for weakness and altered mental status has been going on for a week and has just been getting worse per husband; Per ems "husband thinks it is dementia but unsure" FINDINGS: LUNGS AND PLEURA: No focal pulmonary opacity. No pulmonary edema. No pleural effusion. No pneumothorax. HEART AND MEDIASTINUM: No acute abnormality of the cardiac and mediastinal silhouettes. BONES AND SOFT TISSUES: No acute  osseous abnormality. IMPRESSION: 1. No acute process. Electronically signed by: Zadie Herter MD 06/03/2023 10:52 PM EDT RP Workstation: [id number]    Assessment and plan- Patient is a 89 y.o. female admitted for generalized weakness found to have AKI hypercalcemia and anemia.  Anemia: Labs are suggestive of hypoproliferative anemia given the low reticulocyte count.  Iron studies B12 and folate are within normal limits.  No evidence of hemolysis.  Given the  concomitant hypercalcemia and AKI I will be checking for myeloma panel and serum free light chains tomorrow. Spoke to patients husband in detail. He would like to wait for these labs to result before thinking about bone marrow biopsy. Overall her mental status is poor and she is unresponsive. If this does not improve over next few days, he is considering hospice options but does not think he could do home hospice. He would like to wait for palliative care to be involved until myeloma work up is back  Hypercalcemia: on IVF. Consider pamidronate. PTHrP pending  Acute encephalopathy: likely multifactorial with underlyign vascular dementia, UTI and hypercalcemia    Visit Diagnosis Normocytic anemia Acute encephalopathy  Dr. Seretha Dance, MD, MPH [facility] at [facility] [phone] 06/06/2023

## 2023-06-06 NOTE — Inpatient Diabetes Management (Addendum)
 Inpatient Diabetes Program Recommendations  AACE/ADA: New Consensus Statement on Inpatient Glycemic Control   Target Ranges:  Prepandial:   less than 140 mg/dL      Peak postprandial:   less than 180 mg/dL (1-2 hours)      Critically ill patients:  140 - 180 mg/dL    Latest Reference Range & Units 06/05/23 07:44 06/05/23 09:48 06/05/23 12:44 06/05/23 16:45 06/05/23 20:41 06/06/23 07:28  Glucose-Capillary 70 - 99 mg/dL 161 (H) 096 (H) 045 (H) 156 (H) 171 (H) 257 (H)     Latest Reference Range & Units 06/06/23 04:05  CO2 22 - 32 mmol/L 19 (L)  Glucose 70 - 99 mg/dL 409 (H)  Anion gap 5 - 15  13   Review of Glycemic Control  Diabetes history: DM1 Outpatient Diabetes medications: Lantus  20-26 units at bedtime, Novolog  4-13 units TID with meals Current orders for Inpatient glycemic control: None  Inpatient Diabetes Program Recommendations:    Insulin : Patient last received Novolog  1 unit at 8:47 am on 06/05/23 and Semglee  20 units at 20:56 on 06/04/23. CBG 257 mg/dl today at 8:11 am. Lab glucose 221 mg/dl, CO2 19, and Anion gap 13 today at 4:05 am.  Please consider ordering Novolog  0-9 units AC&HS and Semglee  17 units Q24H.   NOTE: Patient admitted with UTI, anemia, AKI, and weakness. In reviewing chart, noted patient sees Dr. Shelvy Dickens (Endocrinologist) and was last seen on 04/14/23. Per office note on 04/14/23, patient prescribed Lantus  25 units at bedtime, Humalog  4-13 units TID with meals, and Metformin  XR 500 mg BID. Sent chat message to Dr. Authur Leghorn regarding recommendations. MD notes patient not eating; will order low dose Semglee  and Novolog  0-9 units Q4H.  Thanks, Beacher Limerick, RN, MSN, CDCES Diabetes Coordinator Inpatient Diabetes Program [phone] (Team Pager from 8am to 5pm)

## 2023-06-06 NOTE — Care Management Important Message (Signed)
 Important Message  Patient Details  Name: Marissa Huff MRN: [id number] Date of Birth: [birth year]   Important Message Given:  Yes - Medicare IM     Shuayb Schepers W, CMA 06/06/2023, 10:42 AM

## 2023-06-06 NOTE — Progress Notes (Addendum)
 PROGRESS NOTE    Marissa Huff   ZOX:[id number] DOB: 1931/01/07  DOA: 06/03/2023 Date of Service: 06/06/23 which is hospital day 3  PCP: Dellar Fenton, MD    Hospital course / significant events:   HPI: Marissa Huff is a 89 y.o. Caucasian female with medical history significant for carotid artery stenosis, osteoarthritis, diverticulosis, GERD, dyslipidemia, hypertension, hypothyroidism, PUD and type 1 diabetes mellitus, presented to the emergency room with acute onset of generalized weakness for the last few days with associated urinary frequency and urgency as well as altered mental status.   05/31: to ED from home. VSS. BUN of 55 and creatinine 1.98 above previous values and calcium  was 13 with blood glucose of 201. UA was positive for UTI. Started on Rocephin. Admitted to hospitalist service.  06/01: Cr 1.8. Working up hypercalcemia, anemia. Await UCx. 06/02: more lethargic today. UCx (+)Ecoli pending susceptibilities. Hgb stable still low, giving 1 unit PRBC. MRI brain pending, low suspicion for CVA but unable to perform adequate neuro exam d/t patient unable to participate. If CVA, would affect goals/options going forward, so will eval for this or other cause w/ MRI. D/w family re: nutrition, holding feeding tube for now as pt would certainly pull out NG. If not improving despite tx UTI and anemia, and if no other cause found, will assume progressive dementia and likely will need to involve palliative care. PTH pending.  06/03: MRI brain shows osseous findings concerning for  cancer / bony mets / MM, also findings microvascular ischemic disease, c/w vascular dementia picture. Pt still not alert and not eating. Onc and palliative consult. Long discussion w/ husband at bedside - would not want feeding tube, would appreciate cancer workup for confirmation / prognostication. New afib. Confirmed DNR status     Consultants:  Oncology Palliative Care    Procedures/Surgeries: none      ASSESSMENT & PLAN:   Generalized weakness Metabolic encephalopathy d/t UTI, electrolyte derangement, anemia Complicated by underlying dementia - per husband worsening past few months  Secondary to UTI and possibly symptomatic anemia. B12 normal/high MRI brain pending, low suspicion for CVA but unable to perform adequate neuro exam d/t patient unable to participate. If CVA, would affect goals/options going forward, so will eval for this or other cause w/ MRI.   Manage underlying cause(s) as below    Acute lower UTI UCx (+)Ecoli   IV Rocephin   Symptomatic anemia Hgb on admission 7.3, was 12.0 four mos ago Hgb stable still low, giving 1 unit PRBC. stool occult blood. Ferritin, Iron / Retic normal Normal haptoglobin and LDH, hemolysis unlikely Occult blood stool negative follow H&H Transfused 1 unit PRBC 06/02  HemOnc consult   Acute kidney injury superimposed on chronic kidney disease 3a - improving Baseline Cr 0.7-1.1, GFR 43-51 likely prerenal due to dehydration and volume depletion. IV normal saline to continue   follow BMP   Hypercalcemia - suspect malignancy w/ bone involvement Ionized Ca high 7.1 Normal Phos and VitD PTH low  (+)MRI bone loss on MRI brain HemOnc consult CT Chest/Abd/Pelv eval for mass  Monitor BMP  Vascular dementia Anorexia likely d/t dementia Based on history, MRI brain findings D/w husband - pt has had increasing confusion and declining appetite prior to admission If not becoming more alert then this would push more toward hospice vs treatment other conditions Husband and family would not want feeding tube Palliative consult  Tachycardia - new on set Afib Labetalol IV Resume telemetry  Per family wishes,  agree will not anticoagulate at this time given anemia If requiring continuous infusion for rate control will transfer to cardiac unit but family prefers to stay same room for now to minimize  disturbance / discomfort   Type 1 diabetes mellitus  Basal insulin  + SSI   Hx Hypothyroidism  Hyperthyroid labs here w/ low TSH 0.085  and slightly high free T4 4.59 Hold synthroid    Chronic back pain Tramadol  scheduled  Oxycodone  prn   Diarrhea - resolved Cdiff and GI PCR neg Imodium prn  Goals of care discussion Aspiration risk d/t encephalopathy Anorexia d/t vascular dementia  Likely cancer new diagnosis  mental status is not improving despite tx UTI - assume progressive dementia is underlying irreversible cause Long discussion w/ husband at bedside - would not want feeding tube, would appreciate cancer workup for confirmation / prognostication.  Husband is open to consideration for hospice  Husband would like to consider all options / all information available but is leaning toward avoid aggressive measures  DNR status confirmed w/ husband and daughter    Class 1 obesity based on BMI: Body mass index is 31.9 kg/m.Aaron Aas Significantly low or high BMI is associated with higher medical risk.  Underweight - under 18  overweight - 25 to 29 obese - 30 or more Class 1 obesity: BMI of 30.0 to 34 Class 2 obesity: BMI of 35.0 to 39 Class 3 obesity: BMI of 40.0 to 49 Super Morbid Obesity: BMI 50-59 Super-super Morbid Obesity: BMI 60+ Healthy nutrition and physical activity advised as adjunct to other disease management and risk reduction treatments    DVT prophylaxis: lovenox IV fluids: NS continuous IV fluids d/t AKI Nutrition: carb modified diet  Central lines / other devices: none  Code Status: FULL CODE ACP documentation reviewed:  none on file in [facility]  TOC needs: TBD Medical barriers to dispo: UTI, working up anemia and hypercalcemia. Expected medical readiness for discharge several days / pending results.              Subjective / Brief ROS:  Patient confused, lethargic. Husband reports she is not eating and notes her appetitie had gone down prior to  hospitalization  Family Communication: husband at bedside on rounds     Objective Findings:  Vitals:   06/05/23 1851 06/05/23 2045 06/06/23 0529 06/06/23 0731  BP: (!) 155/49 (!) 119/94 (!) 154/78 (!) 158/68  Pulse: 96 68 88 (!) 105  Resp: 20 17  18   Temp: 98.1 F (36.7 C) 98 F (36.7 C) 97.8 F (36.6 C) 98.2 F (36.8 C)  TempSrc: Axillary  Oral   SpO2: 100% 95% 93% 93%  Weight:      Height:        Intake/Output Summary (Last 24 hours) at 06/06/2023 1146 Last data filed at 06/06/2023 1028 Gross per 24 hour  Intake 1662.2 ml  Output --  Net 1662.2 ml   Filed Weights   06/04/23 0034 06/04/23 0120  Weight: 78.9 kg 79.1 kg    Examination:  Physical Exam Constitutional:      General: She is not in acute distress.    Comments: Much more somnolent today but alerts to noxious stimuli  Cardiovascular:     Rate and Rhythm: Tachycardia present. Rhythm irregular.  Pulmonary:     Effort: Pulmonary effort is normal.     Breath sounds: Normal breath sounds.  Abdominal:     General: Abdomen is flat.     Palpations: Abdomen is soft.  Musculoskeletal:  Right lower leg: No edema.     Left lower leg: No edema.  Skin:    General: Skin is warm and dry.  Neurological:     Mental Status: She is disoriented.     Cranial Nerves: No cranial nerve deficit.     Motor: No weakness.          Scheduled Medications:   enoxaparin (LOVENOX) injection  30 mg Subcutaneous Q24H   fluticasone   2 spray Each Nare Daily   insulin  aspart  0-9 Units Subcutaneous Q4H   insulin  glargine-yfgn  5 Units Subcutaneous Daily   labetalol  10 mg Intravenous Once   pantoprazole  (PROTONIX ) IV  40 mg Intravenous Q24H   artificial tears  1 drop Both Eyes QHS    Continuous Infusions:  sodium chloride  125 mL/hr at 06/06/23 0256   sodium chloride      cefTRIAXone (ROCEPHIN)  IV Stopped (06/05/23 2247)    PRN Medications:  acetaminophen  **OR** acetaminophen , albuterol ,  dextromethorphan-guaiFENesin , magnesium  hydroxide, morphine  injection, ondansetron  **OR** ondansetron  (ZOFRAN ) IV  Antimicrobials from admission:  Anti-infectives (From admission, onward)    Start     Dose/Rate Route Frequency Ordered Stop   06/04/23 2200  cefTRIAXone (ROCEPHIN) 1 g in sodium chloride  0.9 % 100 mL IVPB        1 g 200 mL/hr over 30 Minutes Intravenous Every 24 hours 06/04/23 0004     06/04/23 0045  cefTRIAXone (ROCEPHIN) 1 g in sodium chloride  0.9 % 100 mL IVPB  Status:  Discontinued        1 g 200 mL/hr over 30 Minutes Intravenous Every 24 hours 06/04/23 0031 06/04/23 0031   06/03/23 2330  cefTRIAXone (ROCEPHIN) 1 g in sodium chloride  0.9 % 100 mL IVPB        1 g 200 mL/hr over 30 Minutes Intravenous  Once 06/03/23 2323 06/04/23 0017           Data Reviewed:  I have personally reviewed the following...  CBC: Recent Labs  Lab 06/03/23 2225 06/04/23 0127 06/05/23 0359 06/05/23 2134 06/06/23 0405  WBC 5.9 5.6 6.9  --  10.0  NEUTROABS 4.1  --   --   --   --   HGB 7.3* 7.2* 7.2* 8.5* 8.4*  HCT 22.8* 21.7* 21.9* 25.5* 26.5*  MCV 97.4 96.4 96.9  --  97.1  PLT 181 164 158  --  201   Basic Metabolic Panel: Recent Labs  Lab 06/03/23 2225 06/04/23 0127 06/04/23 0927 06/05/23 0359 06/06/23 0405  NA 136 137  --  140 146*  K 4.3 4.0  --  3.8 4.1  CL 103 104  --  110 114*  CO2 22 24  --  23 19*  GLUCOSE 201* 185*  --  131* 221*  BUN 55* 53*  --  40* 38*  CREATININE 1.98* 1.80*  --  1.35* 1.21*  CALCIUM  13.0* 12.7*  --  12.4* 12.3*  PHOS  --   --  4.4  --   --    GFR: Estimated Creatinine Clearance: 30.1 mL/min (A) (by C-G formula based on SCr of 1.21 mg/dL (H)). Liver Function Tests: Recent Labs  Lab 06/05/23 0359 06/06/23 0405  AST 15 27  ALT 20 29  ALKPHOS 51 58  BILITOT 0.7 1.2  PROT 6.0* 6.3*  ALBUMIN 3.3* 3.3*   No results for input(s): "LIPASE", "AMYLASE" in the last 168 hours. No results for input(s): "AMMONIA" in the last 168  hours. Coagulation Profile: No results for  input(s): "INR", "PROTIME" in the last 168 hours. Cardiac Enzymes: No results for input(s): "CKTOTAL", "CKMB", "CKMBINDEX", "TROPONINI" in the last 168 hours. BNP (last 3 results) No results for input(s): "PROBNP" in the last 8760 hours. HbA1C: No results for input(s): "HGBA1C" in the last 72 hours. CBG: Recent Labs  Lab 06/05/23 1244 06/05/23 1645 06/05/23 2041 06/06/23 0728 06/06/23 1131  GLUCAP 137* 156* 171* 257* 272*   Lipid Profile: No results for input(s): "CHOL", "HDL", "LDLCALC", "TRIG", "CHOLHDL", "LDLDIRECT" in the last 72 hours. Thyroid  Function Tests: Recent Labs    06/04/23 0127 06/04/23 0543  TSH  --  0.085*  FREET4 4.59*  --    Anemia Panel: Recent Labs    06/04/23 0543  VITAMINB12 4,204*  FOLATE 14.2  FERRITIN 137  TIBC 318  IRON 42  RETICCTPCT 1.9   Most Recent Urinalysis On File:     Component Value Date/Time   COLORURINE YELLOW (A) 06/03/2023 2225   APPEARANCEUR CLOUDY (A) 06/03/2023 2225   LABSPEC 1.013 06/03/2023 2225   PHURINE 5.0 06/03/2023 2225   GLUCOSEU NEGATIVE 06/03/2023 2225   GLUCOSEU NEGATIVE 10/11/2022 1007   HGBUR NEGATIVE 06/03/2023 2225   BILIRUBINUR NEGATIVE 06/03/2023 2225   BILIRUBINUR negative 08/22/2016 0958   KETONESUR NEGATIVE 06/03/2023 2225   PROTEINUR 30 (A) 06/03/2023 2225   UROBILINOGEN 0.2 10/11/2022 1007   NITRITE NEGATIVE 06/03/2023 2225   LEUKOCYTESUR MODERATE (A) 06/03/2023 2225   Sepsis Labs: @LABRCNTIP (procalcitonin:4,lacticidven:4) Microbiology: Recent Results (from the past 240 hours)  Urine Culture (for pregnant, neutropenic or urologic patients or patients with an indwelling urinary catheter)     Status: Abnormal (Preliminary result)   Collection Time: 06/03/23 10:50 PM   Specimen: Urine, Random  Result Value Ref Range Status   Specimen Description   Final    URINE, RANDOM Performed at [facility], [address] [place], [place]  [address]    Special Requests   Final    NONE Performed at [facility], [address], [place], [place] [address]    Culture (A)  Final    >=100,000 COLONIES/mL ESCHERICHIA COLI 80,000 COLONIES/mL KLEBSIELLA PNEUMONIAE SUSCEPTIBILITIES TO FOLLOW Performed at [facility], [address] [place], [place] [address]    Report Status PENDING  Incomplete   Organism ID, Bacteria ESCHERICHIA COLI (A)  Final      Susceptibility   Escherichia coli - MIC*    AMPICILLIN >=32 RESISTANT Resistant     CEFAZOLIN  <=4 SENSITIVE Sensitive     CEFEPIME <=0.12 SENSITIVE Sensitive     CEFTRIAXONE <=0.25 SENSITIVE Sensitive     CIPROFLOXACIN  >=4 RESISTANT Resistant     GENTAMICIN <=1 SENSITIVE Sensitive     IMIPENEM <=0.25 SENSITIVE Sensitive     NITROFURANTOIN  <=16 SENSITIVE Sensitive     TRIMETH/SULFA >=320 RESISTANT Resistant     AMPICILLIN/SULBACTAM 4 SENSITIVE Sensitive     PIP/TAZO <=4 SENSITIVE Sensitive ug/mL    * >=100,000 COLONIES/mL ESCHERICHIA COLI  C Difficile Quick Screen w PCR reflex     Status: None   Collection Time: 06/04/23  5:35 PM   Specimen: STOOL  Result Value Ref Range Status   C Diff antigen NEGATIVE NEGATIVE Final   C Diff toxin NEGATIVE NEGATIVE Final   C Diff interpretation No C. difficile detected.  Final    Comment: Performed at [facility], [address] [place], [place] [address]  Gastrointestinal Panel by PCR , Stool     Status: None   Collection Time:  06/04/23  5:35 PM   Specimen: Stool  Result Value Ref Range Status   Campylobacter species NOT DETECTED NOT DETECTED Final   Plesimonas shigelloides NOT DETECTED NOT DETECTED Final   Salmonella species NOT DETECTED NOT DETECTED Final   Yersinia enterocolitica NOT DETECTED NOT DETECTED Final   Vibrio species NOT DETECTED NOT DETECTED Final   Vibrio cholerae NOT DETECTED NOT DETECTED Final   Enteroaggregative E coli (EAEC) NOT DETECTED NOT DETECTED Final   Enteropathogenic E coli  (EPEC) NOT DETECTED NOT DETECTED Final   Enterotoxigenic E coli (ETEC) NOT DETECTED NOT DETECTED Final   Shiga like toxin producing E coli (STEC) NOT DETECTED NOT DETECTED Final   Shigella/Enteroinvasive E coli (EIEC) NOT DETECTED NOT DETECTED Final   Cryptosporidium NOT DETECTED NOT DETECTED Final   Cyclospora cayetanensis NOT DETECTED NOT DETECTED Final   Entamoeba histolytica NOT DETECTED NOT DETECTED Final   Giardia lamblia NOT DETECTED NOT DETECTED Final   Adenovirus F40/41 NOT DETECTED NOT DETECTED Final   Astrovirus NOT DETECTED NOT DETECTED Final   Norovirus GI/GII NOT DETECTED NOT DETECTED Final   Rotavirus A NOT DETECTED NOT DETECTED Final   Sapovirus (I, II, IV, and V) NOT DETECTED NOT DETECTED Final    Comment: Performed at [facility], [address], [place], [place] [address]      Radiology Studies last 3 days: MR BRAIN W WO CONTRAST Result Date: 06/05/2023 CLINICAL DATA:  Mental status change, unknown cause EXAM: MRI HEAD WITHOUT AND WITH CONTRAST TECHNIQUE: Multiplanar, multiecho pulse sequences of the brain and surrounding structures were obtained without and with intravenous contrast. CONTRAST:  7mL GADAVIST GADOBUTROL 1 MMOL/ML IV SOLN COMPARISON:  CT head March 19, 2020. MRI head March 27, 2015. FINDINGS: Brain: No acute infarction, hemorrhage, hydrocephalus, extra-axial collection or mass lesion. Cerebral atrophy. Patchy T2/FLAIR hyperintensities in the white matter, nonspecific but compatible with chronic microvascular ischemic disease. Vascular: Major arterial flow voids are maintained. Skull and upper cervical spine: Diffusely heterogeneous bone marrow with patchy enhancement. Sinuses/Orbits: Clear sinuses.  No acute findings. Other: Major arterial flow voids are maintained. IMPRESSION: 1. No evidence of acute intracranial abnormality. 2. Diffusely heterogeneous bone marrow with patchy enhancement, nonspecific but potentially related to metabolic derangement or  multiple myeloma. Diffuse osseous metastatic disease is a differential consideration but thought less likely in the absence of a known primary malignancy. Electronically Signed   By: Stevenson Elbe M.D.   On: 06/05/2023 20:03   DG Chest Portable 1 View Result Date: 06/03/2023 EXAM: 1 VIEW XRAY OF THE CHEST 06/03/2023 10:51:04 PM COMPARISON: 10/28/2021 CLINICAL HISTORY: Weakness. PER ER NOTE; BIB ems from home for weakness and altered mental status has been going on for a week and has just been getting worse per husband; Per ems "husband thinks it is dementia but unsure" FINDINGS: LUNGS AND PLEURA: No focal pulmonary opacity. No pulmonary edema. No pleural effusion. No pneumothorax. HEART AND MEDIASTINUM: No acute abnormality of the cardiac and mediastinal silhouettes. BONES AND SOFT TISSUES: No acute osseous abnormality. IMPRESSION: 1. No acute process. Electronically signed by: Zadie Herter MD 06/03/2023 10:52 PM EDT RP Workstation: [id number]         Melodi Sprung, DO [facility] 06/06/2023, 11:46 AM    Dictation software may have been used to generate the above note. Typos may occur and escape review in typed/dictated notes. Please contact Dr Authur Leghorn directly for clarity if needed.  Staff may message me via secure chat in Epic  but this may not  receive an immediate response,  please page me for urgent matters!  If 7PM-7AM, please contact night coverage [email or web address]

## 2023-06-06 NOTE — Plan of Care (Signed)
 Lethargic, and non-verbal this shift. Morphine  as needed for pain and discomfort. Given x1 today for moaning and facial grimacing. Has remained free from injury this shift. No respiratory issues at present. No skin breakdown. Incontinent of bowel and bladder. Unable to make needs known. Needs are anticipated and met by staff. Unable to take anything by mouth. Comfort care initiated today Problem: Clinical Measurements: Goal: Will remain free from infection Outcome: Progressing Goal: Respiratory complications will improve Outcome: Progressing   Problem: Pain Managment: Goal: General experience of comfort will improve and/or be controlled Outcome: Progressing   Problem: Safety: Goal: Ability to remain free from injury will improve Outcome: Progressing   Problem: Skin Integrity: Goal: Risk for impaired skin integrity will decrease Outcome: Progressing

## 2023-06-06 NOTE — Plan of Care (Signed)

## 2023-06-07 DIAGNOSIS — R531 Weakness: Secondary | ICD-10-CM | POA: Diagnosis not present

## 2023-06-07 DIAGNOSIS — Z7189 Other specified counseling: Secondary | ICD-10-CM

## 2023-06-07 LAB — URINE CULTURE: Culture: >=100000 — AB

## 2023-06-07 LAB — CBC
HCT: 25.8 % — ABNORMAL LOW (ref 36.0–46.0)
Hemoglobin: 8.3 g/dL — ABNORMAL LOW (ref 12.0–15.0)
MCH: 31.6 pg (ref 26.0–34.0)
MCHC: 32.2 g/dL (ref 30.0–36.0)
MCV: 98.1 fL (ref 80.0–100.0)
Platelets: 192 10*3/uL (ref 150–400)
RBC: 2.63 MIL/uL — ABNORMAL LOW (ref 3.87–5.11)
RDW: 17.9 % — ABNORMAL HIGH (ref 11.5–15.5)
WBC: 10.8 10*3/uL — ABNORMAL HIGH (ref 4.0–10.5)
nRBC: 0 % (ref 0.0–0.2)

## 2023-06-07 LAB — BASIC METABOLIC PANEL WITH GFR
Anion gap: 9 (ref 5–15)
BUN: 47 mg/dL — ABNORMAL HIGH (ref 8–23)
CO2: 20 mmol/L — ABNORMAL LOW (ref 22–32)
Calcium: 12.1 mg/dL — ABNORMAL HIGH (ref 8.9–10.3)
Chloride: 118 mmol/L — ABNORMAL HIGH (ref 98–111)
Creatinine, Ser: 1.25 mg/dL — ABNORMAL HIGH (ref 0.44–1.00)
GFR, Estimated: 41 mL/min — ABNORMAL LOW (ref >60)
Glucose, Bld: 211 mg/dL — ABNORMAL HIGH (ref 70–99)
Potassium: 3.5 mmol/L (ref 3.5–5.1)
Sodium: 147 mmol/L — ABNORMAL HIGH (ref 135–145)

## 2023-06-07 LAB — PHOSPHORUS: Phosphorus: 2.9 mg/dL (ref 2.5–4.6)

## 2023-06-07 LAB — GLUCOSE, CAPILLARY
Glucose-Capillary: 181 mg/dL — ABNORMAL HIGH (ref 70–99)
Glucose-Capillary: 181 mg/dL — ABNORMAL HIGH (ref 70–99)
Glucose-Capillary: 195 mg/dL — ABNORMAL HIGH (ref 70–99)
Glucose-Capillary: 257 mg/dL — ABNORMAL HIGH (ref 70–99)

## 2023-06-07 LAB — MAGNESIUM: Magnesium: 2 mg/dL (ref 1.7–2.4)

## 2023-06-07 LAB — OSMOLALITY: Osmolality: 329 mosm/kg (ref 275–295)

## 2023-06-07 LAB — TSH: TSH: 0.058 u[IU]/mL — ABNORMAL LOW (ref 0.350–4.500)

## 2023-06-07 LAB — T4, FREE: Free T4: 1.47 ng/dL — ABNORMAL HIGH (ref 0.61–1.12)

## 2023-06-07 MED ORDER — BIOTENE DRY MOUTH MT LIQD: 15.0000 mL | OROMUCOSAL | Status: DC | PRN

## 2023-06-07 MED ORDER — GLYCOPYRROLATE 0.2 MG/ML IJ SOLN
0.2000 mg | INTRAMUSCULAR | Status: DC | PRN
Start: 2023-06-07 — End: 2023-06-07

## 2023-06-07 MED ORDER — LORAZEPAM 1 MG PO TABS: 1.0000 mg | ORAL_TABLET | ORAL | Status: DC | PRN

## 2023-06-07 MED ORDER — HALOPERIDOL LACTATE 5 MG/ML IJ SOLN: 0.5000 mg | INTRAMUSCULAR | Status: DC | PRN

## 2023-06-07 MED ORDER — GLYCOPYRROLATE 0.2 MG/ML IJ SOLN: 0.2000 mg | INTRAMUSCULAR | Status: DC | PRN

## 2023-06-07 MED ORDER — MORPHINE SULFATE (PF) 2 MG/ML IV SOLN: 1.0000 mg | INTRAVENOUS | Status: DC | PRN

## 2023-06-07 MED ORDER — HALOPERIDOL 0.5 MG PO TABS
0.5000 mg | ORAL_TABLET | ORAL | Status: DC | PRN
Start: 2023-06-07 — End: 2023-06-07

## 2023-06-07 MED ORDER — GLYCOPYRROLATE 1 MG PO TABS
1.0000 mg | ORAL_TABLET | ORAL | Status: DC | PRN
  Filled 2023-06-07: qty 1

## 2023-06-07 MED ORDER — SODIUM CHLORIDE 0.45 % IV SOLN: INTRAVENOUS | Status: DC

## 2023-06-07 MED ORDER — HALOPERIDOL LACTATE 2 MG/ML PO CONC: 0.5000 mg | ORAL | Status: DC | PRN

## 2023-06-07 MED ORDER — POLYVINYL ALCOHOL 1.4 % OP SOLN
1.0000 [drp] | Freq: Four times a day (QID) | OPHTHALMIC | Status: DC | PRN
  Filled 2023-06-07: qty 15

## 2023-06-07 MED ORDER — LORAZEPAM 2 MG/ML PO CONC: 1.0000 mg | ORAL | Status: DC | PRN

## 2023-06-07 MED ORDER — LORAZEPAM 2 MG/ML IJ SOLN: 1.0000 mg | INTRAMUSCULAR | Status: DC | PRN

## 2023-06-07 NOTE — Inpatient Diabetes Management (Signed)
 Inpatient Diabetes Program Recommendations  AACE/ADA: New Consensus Statement on Inpatient Glycemic Control  Target Ranges:  Prepandial:   less than 140 mg/dL      Peak postprandial:   less than 180 mg/dL (1-2 hours)      Critically ill patients:  140 - 180 mg/dL    Latest Reference Range & Units 06/06/23 07:28 06/06/23 11:31 06/06/23 17:52 06/06/23 21:02 06/07/23 00:33 06/07/23 04:08  Glucose-Capillary 70 - 99 mg/dL 161 (H)  Novolog  3 units 272 (H)  Novolog  5 units  Semglee  5 units 276 (H)  Novolog  5 units 232 (H)  Novolog  2 units 181 (H)  Novolog  2 units 195 (H)  Novolog  2 units   Review of Glycemic Control  Diabetes history: DM1 Outpatient Diabetes medications: Lantus  20-26 units at bedtime, Novolog  4-13 units TID with meals Current orders for Inpatient glycemic control: Semglee  5 units daily, Novolog  0-9 units Q4H  Inpatient Diabetes Program Recommendations:    Insulin : Please consider increasing Semglee   to 15 units daily.  Thanks, Beacher Limerick, RN, MSN, CDCES Diabetes Coordinator Inpatient Diabetes Program [phone] (Team Pager from 8am to 5pm)

## 2023-06-07 NOTE — Progress Notes (Signed)
 Lab called with a Critical serum osmolality 329, Dr Hubert Madden notified face to face, acknowledged, no new orders

## 2023-06-07 NOTE — Progress Notes (Addendum)
 [facility]- [facility] Liaison Note  Received request from Transitions of Care Manger for family interest in [facility].  Eligibility has been confirmed.  Met with spouse  to confirm interest and explain services. Spouse agreeable to transfer today.   Transitions of Care manager aware.  RN please call report to [phone]  [facility]) prior to patient leaving the unit.  Please send signed and completed DNR with patient at discharge.  Thank you  Helmut Lobe,  [facility] Liaison 336 [phone]

## 2023-06-07 NOTE — TOC Progression Note (Signed)
 Transition of Care [facility]) - Progression Note    Patient Details  Name: Marissa Huff MRN: [id number] Date of Birth: 30-Nov-1931  [facility]) CM/SW Contact  Elsie Halo, RN Phone Number: 06/07/2023, 3:04 PM  Clinical Narrative:     The patients husband has decided to make the patient comfort care. The family would like for her to [facility] to [facility]. The would like [facility]. TOC outreached to Marylou Sobers with [facility] who accepted the referral.    Barriers to Discharge: Continued Medical Work up  Expected Discharge Plan and Services       Living arrangements for the past 2 months: Single Family Home                                       Social Determinants of Health (SDOH) Interventions SDOH Screenings   Food Insecurity: No Food Insecurity (06/04/2023)  Housing: Low Risk  (06/04/2023)  Transportation Needs: No Transportation Needs (06/04/2023)  Utilities: Not At Risk (06/04/2023)  Alcohol Screen: Low Risk  (12/05/2022)  Depression (PHQ2-9): Low Risk  (05/19/2023)  Financial Resource Strain: Low Risk  (12/05/2022)  Physical Activity: Inactive (12/05/2022)  Social Connections: Moderately Integrated (06/04/2023)  Stress: Stress Concern Present (12/05/2022)  Tobacco Use: Low Risk  (06/04/2023)  Health Literacy: Adequate Health Literacy (12/05/2022)    Readmission Risk Interventions     No data to display

## 2023-06-07 NOTE — Discharge Summary (Signed)
 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   Specialty: Internal Medicine Why: hospital follow up Contact information: [address] [place] [address] [phone]                Diet recommendation: NPO  Activity: Bedrest due to risk of fall  Discharge Condition: stable  Code Status: DNR-comfort  History of present illness: As per the H and P dictated on admission Hospital Course:  Marissa Huff is a 89 y.o. Caucasian female with medical history significant for carotid artery stenosis, osteoarthritis, diverticulosis, GERD, dyslipidemia, hypertension, hypothyroidism, PUD and type 1 diabetes mellitus, presented to the emergency room with acute onset of generalized weakness for the last few days with associated urinary frequency and urgency as well as altered mental status.    05/31: to ED from home. VSS. BUN of 55 and creatinine 1.98 above previous values and calcium  was 13 with blood glucose of 201. UA was positive for UTI. Started on Rocephin. Admitted to hospitalist service.  06/01: Cr 1.8. Working up hypercalcemia, anemia. Await UCx. 06/02: more lethargic today. UCx (+)Ecoli pending susceptibilities. Hgb stable still low, giving 1 unit PRBC. MRI brain pending, low suspicion for CVA but unable to perform adequate neuro exam d/t patient unable to  participate. If CVA, would affect goals/options going forward, so will eval for this or other cause w/ MRI. D/w family re: nutrition, holding feeding tube for now as pt would certainly pull out NG. If not improving despite tx UTI and anemia, and if no other cause found, will assume progressive dementia and likely will need to involve palliative care. PTH pending.  06/03: MRI brain shows osseous findings concerning for  cancer / bony mets / MM, also findings microvascular ischemic disease, c/w vascular dementia picture. Pt still not alert and not eating. Onc and palliative consult. Long discussion w/ husband at bedside - would not want feeding tube, would appreciate cancer workup for confirmation / prognostication. New afib. Confirmed DNR status   6/4 patient was transition to comfort measures only and hospice care.  Palliative consult appreciated, all the blood work and medications were discontinued, comfort measure was started.  TOC consulted hospice team. As per hospice patient got excepted today for the inpatient hospice facility and she can be transferred to hospice facility today.  Patient seems appropriate and stable to transfer to hospice facility today.  Patient was admitted on 06/03/2023 and I started taking care of the patient today on 06/07/2023. Today patient's husband decided to move forward for comfort measures and hospice placement.  During hospital stay patient was managed as below by [facility] hospitalist.    ASSESSMENT & PLAN:   Generalized weakness Metabolic encephalopathy d/t UTI, electrolyte derangement, anemia Complicated by underlying dementia - per husband worsening past few months  Secondary to UTI and possibly symptomatic anemia. B12 normal/high MRI brain pending, low suspicion for CVA  but unable to perform adequate neuro exam d/t patient unable to participate. If CVA, would affect goals/options going forward, so will eval for this or other cause w/ MRI.   Manage underlying  cause(s) as below    Acute lower UTI UCx (+)Ecoli  s/p IV Rocephin   Symptomatic anemia Hgb on admission 7.3, was 12.0 four mos ago Hgb stable still low, giving 1 unit PRBC. stool occult blood. Ferritin, Iron / Retic normal Normal haptoglobin and LDH, hemolysis unlikely Occult blood stool negative Transfused 1 unit PRBC 06/02  HemOnc consulted   Acute kidney injury superimposed on chronic kidney disease 3a - improving Baseline Cr 0.7-1.1, GFR 43-51 likely prerenal due to dehydration and volume depletion. 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 Based on history, MRI brain findings D/w husband - pt has had increasing confusion and declining appetite prior to admission If not becoming more alert then this would push more toward hospice vs treatment other conditions Husband and family would not want feeding tube Palliative consulted    Tachycardia - new on set Afib S/p Labetalol IV, and telemetry  Per family wishes, agree will not anticoagulate at this time given anemia If requiring continuous infusion for rate control will transfer to cardiac unit but family prefers to stay same room for now to minimize disturbance / discomfort    Type 1 diabetes mellitus  Basal insulin  + SSI   Hx Hypothyroidism  Hyperthyroid labs here w/ low TSH 0.085  and slightly high free T4 4.59. Hold synthroid     Chronic back pain: s/p Tramadol  and Oxycodone  prn    Diarrhea - resolved Cdiff and GI PCR neg Imodium prn  Goals of care discussion Aspiration risk d/t encephalopathy Anorexia d/t vascular dementia  Likely cancer new diagnosis  mental status is not improving despite tx UTI - assume progressive dementia is underlying irreversible cause Long discussion w/ husband at  bedside - would not want feeding tube, would appreciate cancer workup for confirmation / prognostication.  Husband is open to consideration for hospice  Husband would like to consider all options / all information available but is leaning toward avoid aggressive measures  DNR status confirmed w/ husband and daughter   Body mass index is 31.9 kg/m.  Nutrition Interventions:   Consultants: Oncology, Palliative and Hospice Care  Procedures: None  Discharge Exam: General: Patient was completely obtunded, mild SOB, not in acute distress. MM dry, mouth open  and eyes closed Cardiovascular: S1 and S2 Present, no Murmur, Respiratory: Equal air entry bilaterally  Abdomen: BS present, Soft and ND Neurology: Lethargic and completely obtunded, unable to follow commands  Filed Weights   06/04/23 0034 06/04/23 0120  Weight: 78.9 kg 79.1 kg   Vitals:   06/07/23 0820 06/07/23 1227  BP: (!) 157/71 (!) 156/92  Pulse: (!) 106 (!) 107  Resp: 16 16  Temp: 98 F (36.7 C) 98.3 F (36.8 C)  SpO2: 90% 98%    DISCHARGE MEDICATION: Allergies as of 06/07/2023       Reactions   Penicillins Rash   IgE = 10 (WNL) on 06/28/2022        Medication List     STOP taking these medications    Benefiber Powd   busPIRone  5 MG tablet Commonly known as:  BUSPAR    clobetasol  0.05 % external solution Commonly known as: TEMOVATE    fexofenadine 180 MG tablet Commonly known as: ALLEGRA   FreeStyle Libre 14 Day Reader [employer] 14 Day Sensor Misc   glucagon  1 MG injection   Lantus  SoloStar 100 UNIT/ML Solostar Pen Generic drug: insulin  glargine   levothyroxine  200 MCG tablet Commonly known as: SYNTHROID    MIRALAX  PO   MULTIPLE VITAMINS-MINERALS PO   NovoLOG  FlexPen 100 UNIT/ML FlexPen Generic drug: insulin  aspart   OVER THE COUNTER MEDICATION   OVER THE COUNTER MEDICATION   pantoprazole  40 MG tablet Commonly known as: PROTONIX    Pataday  0.7 % Soln Generic drug:  Olopatadine  HCl   rosuvastatin  5 MG tablet Commonly known as: CRESTOR    traMADol  50 MG tablet Commonly known as: ULTRAM    valsartan  160 MG tablet Commonly known as: DIOVAN        TAKE these medications    acetaminophen  325 MG tablet Commonly known as: TYLENOL  Take 1-2 tablets (325-650 mg total) by mouth every 6 (six) hours as needed for mild pain (pain score 1-3 or temp > 100.5).   albuterol  108 (90 Base) MCG/ACT inhaler Commonly known as: VENTOLIN  HFA Inhale 1-2 puffs into the lungs every 6 (six) hours as needed for wheezing or shortness of breath.   dextromethorphan-guaiFENesin  30-600 MG 12hr tablet Commonly known as: MUCINEX  DM Take 1 tablet by mouth daily as needed for cough.   fluticasone  50 MCG/ACT nasal spray Commonly known as: FLONASE  SPRAY 2 SPRAYS INTO EACH NOSTRIL EVERY DAY   SYSTANE OP Place 1 drop into both eyes at bedtime.       Allergies  Allergen Reactions   Penicillins Rash    IgE = 10 (WNL) on 06/28/2022   Discharge Instructions     Call MD for:  severe uncontrolled pain   Complete by: As directed    Call MD for:  temperature >100.4   Complete by: As directed    Discharge instructions   Complete by: As directed    Hospice care       The results of significant diagnostics from this hospitalization (including imaging, microbiology, ancillary and laboratory) are listed below for reference.    Significant Diagnostic Studies: MR BRAIN W WO CONTRAST Result Date: 06/05/2023 CLINICAL DATA:  Mental status change, unknown cause EXAM: MRI HEAD WITHOUT AND WITH CONTRAST TECHNIQUE: Multiplanar, multiecho pulse sequences of the brain and surrounding structures were obtained without and with intravenous contrast. CONTRAST:  7mL GADAVIST GADOBUTROL 1 MMOL/ML IV SOLN COMPARISON:  CT head March 19, 2020. MRI head March 27, 2015. FINDINGS: Brain: No acute infarction, hemorrhage, hydrocephalus, extra-axial collection or mass lesion. Cerebral atrophy. Patchy  T2/FLAIR hyperintensities in the white matter, nonspecific but compatible with chronic microvascular ischemic disease. Vascular: Major arterial flow voids are maintained. Skull and upper cervical spine: Diffusely heterogeneous bone marrow with patchy enhancement. Sinuses/Orbits: Clear sinuses.  No acute findings. Other: Major arterial flow voids are maintained. IMPRESSION: 1. No evidence of acute intracranial abnormality. 2. Diffusely heterogeneous bone marrow with patchy enhancement, nonspecific but potentially related to metabolic derangement or multiple myeloma. Diffuse osseous metastatic disease is a differential consideration but thought less likely in the absence of a known primary malignancy. Electronically Signed   By: Stevenson Elbe M.D.   On: 06/05/2023 20:03   DG Chest Portable 1 View Result Date: 06/03/2023 EXAM: 1 VIEW XRAY OF THE CHEST 06/03/2023 10:51:04 PM COMPARISON: 10/28/2021 CLINICAL HISTORY: Weakness. PER ER NOTE; BIB ems from home  for weakness and altered mental status has been going on for a week and has just been getting worse per husband; Per ems "husband thinks it is dementia but unsure" FINDINGS: LUNGS AND PLEURA: No focal pulmonary opacity. No pulmonary edema. No pleural effusion. No pneumothorax. HEART AND MEDIASTINUM: No acute abnormality of the cardiac and mediastinal silhouettes. BONES AND SOFT TISSUES: No acute osseous abnormality. IMPRESSION: 1. No acute process. Electronically signed by: Zadie Herter MD 06/03/2023 10:52 PM EDT RP Workstation: [id number]    Microbiology: Recent Results (from the past 240 hours)  Urine Culture (for pregnant, neutropenic or urologic patients or patients with an indwelling urinary catheter)     Status: Abnormal   Collection Time: 06/03/23 10:50 PM   Specimen: Urine, Random  Result Value Ref Range Status   Specimen Description   Final    URINE, RANDOM Performed at [facility], [address] [place], [place] [address]     Special Requests   Final    NONE Performed at [facility], [address], [place], [place] [address]    Culture (A)  Final    >=100,000 COLONIES/mL ESCHERICHIA COLI 80,000 COLONIES/mL KLEBSIELLA PNEUMONIAE    Report Status 06/07/2023 FINAL  Final   Organism ID, Bacteria ESCHERICHIA COLI (A)  Final   Organism ID, Bacteria KLEBSIELLA PNEUMONIAE (A)  Final      Susceptibility   Escherichia coli - MIC*    AMPICILLIN >=32 RESISTANT Resistant     CEFAZOLIN  <=4 SENSITIVE Sensitive     CEFEPIME <=0.12 SENSITIVE Sensitive     CEFTRIAXONE <=0.25 SENSITIVE Sensitive     CIPROFLOXACIN  >=4 RESISTANT Resistant     GENTAMICIN <=1 SENSITIVE Sensitive     IMIPENEM <=0.25 SENSITIVE Sensitive     NITROFURANTOIN  <=16 SENSITIVE Sensitive     TRIMETH/SULFA >=320 RESISTANT Resistant     AMPICILLIN/SULBACTAM 4 SENSITIVE Sensitive     PIP/TAZO <=4 SENSITIVE Sensitive ug/mL    * >=100,000 COLONIES/mL ESCHERICHIA COLI   Klebsiella pneumoniae - MIC*    AMPICILLIN RESISTANT Resistant     CEFAZOLIN  <=4 SENSITIVE Sensitive     CEFEPIME <=0.12 SENSITIVE Sensitive     CEFTRIAXONE <=0.25 SENSITIVE Sensitive     CIPROFLOXACIN  <=0.25 SENSITIVE Sensitive     GENTAMICIN <=1 SENSITIVE Sensitive     IMIPENEM <=0.25 SENSITIVE Sensitive     NITROFURANTOIN  64 INTERMEDIATE Intermediate     TRIMETH/SULFA <=20 SENSITIVE Sensitive     AMPICILLIN/SULBACTAM <=2 SENSITIVE Sensitive     PIP/TAZO <=4 SENSITIVE Sensitive ug/mL    * 80,000 COLONIES/mL KLEBSIELLA PNEUMONIAE  C Difficile Quick Screen w PCR reflex     Status: None   Collection Time: 06/04/23  5:35 PM   Specimen: STOOL  Result Value Ref Range Status   C Diff antigen NEGATIVE NEGATIVE Final   C Diff toxin NEGATIVE NEGATIVE Final   C Diff interpretation No C. difficile detected.  Final    Comment: Performed at [facility], [address] [place], [place] [address]  Gastrointestinal Panel by PCR , Stool     Status: None   Collection  Time: 06/04/23  5:35 PM   Specimen: Stool  Result Value Ref Range Status   Campylobacter species NOT DETECTED NOT DETECTED Final   Plesimonas shigelloides NOT DETECTED NOT DETECTED Final   Salmonella species NOT DETECTED NOT DETECTED Final   Yersinia enterocolitica NOT DETECTED NOT DETECTED Final   Vibrio species NOT DETECTED NOT DETECTED Final   Vibrio cholerae NOT DETECTED NOT DETECTED  Final   Enteroaggregative E coli (EAEC) NOT DETECTED NOT DETECTED Final   Enteropathogenic E coli (EPEC) NOT DETECTED NOT DETECTED Final   Enterotoxigenic E coli (ETEC) NOT DETECTED NOT DETECTED Final   Shiga like toxin producing E coli (STEC) NOT DETECTED NOT DETECTED Final   Shigella/Enteroinvasive E coli (EIEC) NOT DETECTED NOT DETECTED Final   Cryptosporidium NOT DETECTED NOT DETECTED Final   Cyclospora cayetanensis NOT DETECTED NOT DETECTED Final   Entamoeba histolytica NOT DETECTED NOT DETECTED Final   Giardia lamblia NOT DETECTED NOT DETECTED Final   Adenovirus F40/41 NOT DETECTED NOT DETECTED Final   Astrovirus NOT DETECTED NOT DETECTED Final   Norovirus GI/GII NOT DETECTED NOT DETECTED Final   Rotavirus A NOT DETECTED NOT DETECTED Final   Sapovirus (I, II, IV, and V) NOT DETECTED NOT DETECTED Final    Comment: Performed at [facility], [address] [place], [place] [address]     Labs: CBC: Recent Labs  Lab 06/03/23 2225 06/04/23 0127 06/05/23 0359 06/05/23 2134 06/06/23 0405 06/07/23 0433  WBC 5.9 5.6 6.9  --  10.0 10.8*  NEUTROABS 4.1  --   --   --   --   --   HGB 7.3* 7.2* 7.2* 8.5* 8.4* 8.3*  HCT 22.8* 21.7* 21.9* 25.5* 26.5* 25.8*  MCV 97.4 96.4 96.9  --  97.1 98.1  PLT 181 164 158  --  201 192   Basic Metabolic Panel: Recent Labs  Lab 06/03/23 2225 06/04/23 0127 06/04/23 0927 06/05/23 0359 06/06/23 0405 06/07/23 0433  NA 136 137  --  140 146* 147*  K 4.3 4.0  --  3.8 4.1 3.5  CL 103 104  --  110 114* 118*  CO2 22 24  --  23 19* 20*  GLUCOSE 201* 185*   --  131* 221* 211*  BUN 55* 53*  --  40* 38* 47*  CREATININE 1.98* 1.80*  --  1.35* 1.21* 1.25*  CALCIUM  13.0* 12.7*  --  12.4* 12.3* 12.1*  MG  --   --   --   --   --  2.0  PHOS  --   --  4.4  --   --  2.9   Liver Function Tests: Recent Labs  Lab 06/05/23 0359 06/06/23 0405  AST 15 27  ALT 20 29  ALKPHOS 51 58  BILITOT 0.7 1.2  PROT 6.0* 6.3*  ALBUMIN 3.3* 3.3*   No results for input(s): "LIPASE", "AMYLASE" in the last 168 hours. No results for input(s): "AMMONIA" in the last 168 hours. Cardiac Enzymes: No results for input(s): "CKTOTAL", "CKMB", "CKMBINDEX", "TROPONINI" in the last 168 hours. BNP (last 3 results) No results for input(s): "BNP" in the last 8760 hours. CBG: Recent Labs  Lab 06/06/23 2102 06/07/23 0033 06/07/23 0408 06/07/23 0821 06/07/23 1229  GLUCAP 232* 181* 195* 181* 257*    Time spent: 35 minutes  Signed:  Althia Atlas  [facility] 06/07/2023 4:21 PM

## 2023-06-07 NOTE — Progress Notes (Signed)
 SLP Cancellation Note  Patient Details Name: Marissa Huff MRN: [id number] DOB: [birth year]   Cancelled treatment:       Reason Eval/Treat Not Completed: Patient not medically ready;Patient's level of consciousness (consulted NSG; met w/ pt briefly.)   Pt resting in bed. She only alerted briefly to MOD+ stim and did not maintain an alert State for (safe) attempt at po trials. Discussed w/ NSG who agreed stating pt only alerts briefly w/ her and would not be appropriate for oral intake. NSG has been offering oral care for hygiene and stimulation of swallowing.  NSG noted pt's Husband met w/ Oncology and per chart notes/NSG: "06/03: MRI brain shows osseous findings concerning for cancer / bony mets / MM, also findings microvascular ischemic disease, c/w vascular Dementia picture. Pt still not alert and not eating. Long discussion w/ husband at bedside - would not want feeding tube. Discussion re: Hospice services ongoing".   ST services will monitor pt's status next 1-2 days to determine any improvement in status that would indicate appropriateness for oral intake(safely). Meeting w/ Palliative Care/Hospice/MDs re: GOC moving forward to follow.  NSG continuing w/ oral care during the day.      Marissa Edward, MS, CCC-SLP Speech Language Pathologist Rehab Services; [facility] [phone] (ascom) Boleslaw Borghi 06/07/2023, 10:11 AM

## 2023-06-07 NOTE — Plan of Care (Signed)

## 2023-06-07 NOTE — Consult Note (Signed)
 Consultation Note Date: 06/07/2023   Patient Name: Marissa Huff  DOB: 1931-04-09  MRN: [id number]  Age / Sex: 89 y.o., female  PCP: Dellar Fenton, MD Referring Physician: Althia Atlas, MD  Reason for Consultation: Establishing goals of care  HPI/Patient Profile: Per H&P :JERALDINE PRIMEAU is a 89 y.o. Caucasian female with medical history significant for carotid artery stenosis, osteoarthritis, diverticulosis, GERD, dyslipidemia, hypertension, hypothyroidism, PUD and type 1 diabetes mellitus, presented to the emergency room with acute onset of generalized weakness for the last few days with associated urinary frequency and urgency as well as altered mental status.  She admitted to mild cough without wheezing or dyspnea at rest.  She has been having fatigue and dyspnea on exertion.  No chest pain or palpitations.  No nausea or vomiting or abdominal pain.  No diarrhea or melena or bright red bleeding per rectum.  No other bleeding diathesis.  Clinical Assessment and Goals of Care:  Notes and labs reviewed. Notified by staff husband wants comfort care with hospice placement.   Husband states they have 2 daughters. He discusses decline over the last 4 months. He states she has had dementia symptoms for the past year.    We discussed her diagnosis, prognosis, GOC, EOL wishes disposition and options.  Created space and opportunity for patient  to explore thoughts and feelings regarding current medical information.   A detailed discussion was had today regarding advanced directives.  Concepts specific to code status, artifical feeding and hydration, IV antibiotics and rehospitalization were discussed.  The difference between an aggressive medical intervention path and a comfort care path was discussed.  Values and goals of care important to patient and family were attempted to be elicited.  Discussed limitations of  medical interventions to prolong quality of life in some situations and discussed the concept of human mortality.  Husband desires hospice facility placement.   I completed a MOST form today with husband and the signed original was placed in the chart. Each section of options on the form were reviewed in full detail and any questions were answered as needed. The form was scanned and sent to medical records for it to be uploaded under ACP tab in Epic. A photocopy was also placed in the chart to be scanned into EMR. The patient outlined their wishes for the following treatment decisions:  Cardiopulmonary Resuscitation: Do Not Attempt Resuscitation (DNR/No CPR)  Medical Interventions: Comfort Measures: Keep clean, warm, and dry. Use medication by any route, positioning, wound care, and other measures to relieve pain and suffering. Use oxygen, suction and manual treatment of airway obstruction as needed for comfort. Do not transfer to the hospital unless comfort needs cannot be met in current location.  Antibiotics: No antibiotics (use other measures to relieve symptoms)  IV Fluids: No IV fluids (provide other measures to ensure comfort)  Feeding Tube: No feeding tube         SUMMARY OF RECOMMENDATIONS   Comfort care. Husband wants hospice facility.  Primary Diagnoses: Present on Admission:  Hypothyroidism   I have reviewed the medical record, interviewed the patient and family, and examined the patient. The following aspects are pertinent.  Past Medical History:  Diagnosis Date   Allergy    Arthritis    Carotid artery disease (HCC)    Carpal tunnel syndrome, bilateral    Chronic kidney disease, stage 2, mildly decreased GFR    Diabetes mellitus without complication (HCC)    Diverticulosis    Gastric lymphoma (HCC)    Gastroparesis    GERD (gastroesophageal reflux disease)    History of kidney stones    Hyperlipidemia    Hypertension associated with diabetes (HCC)     Hypothyroidism    Malignant lymphoma (HCC)    gastric   PUD (peptic ulcer disease)    Skin cancer    Spinal stenosis    lumbar laminectomy   Type 1 diabetes mellitus (HCC)    Urinary incontinence    Social History   Socioeconomic History   Marital status: Married    Spouse name: Charles   Number of children: 2   Years of education: Not on file   Highest education level: Not on file  Occupational History   Not on file  Tobacco Use   Smoking status: Never   Smokeless tobacco: Never  Vaping Use   Vaping status: Never Used  Substance and Sexual Activity   Alcohol use: No    Alcohol/week: 0.0 standard drinks of alcohol   Drug use: No   Sexual activity: Not Currently    Birth control/protection: Surgical  Other Topics Concern   Not on file  Social History Narrative   married   Social Drivers of Corporate investment banker Strain: Low Risk  (12/05/2022)   Overall Financial Resource Strain (CARDIA)    Difficulty of Paying Living Expenses: Not hard at all  Food Insecurity: No Food Insecurity (06/04/2023)   Hunger Vital Sign    Worried About Running Out of Food in the Last Year: Never true    Ran Out of Food in the Last Year: Never true  Transportation Needs: No Transportation Needs (06/04/2023)   PRAPARE - Administrator, Civil Service (Medical): No    Lack of Transportation (Non-Medical): No  Physical Activity: Inactive (12/05/2022)   Exercise Vital Sign    Days of Exercise per Week: 0 days    Minutes of Exercise per Session: 0 min  Stress: Stress Concern Present (12/05/2022)   [employer] of Occupational Health - Occupational Stress Questionnaire    Feeling of Stress : To some extent  Social Connections: Moderately Integrated (06/04/2023)   Social Connection and Isolation Panel [NHANES]    Frequency of Communication with Friends and Family: Three times a week    Frequency of Social Gatherings with Friends and Family: Three times a week    Attends Religious  Services: More than 4 times per year    Active Member of Clubs or Organizations: No    Attends Banker Meetings: Never    Marital Status: Married   Family History  Problem Relation Age of Onset   Stroke Mother    Stroke Father    Hypertension Father    Diabetes Brother    Multiple sclerosis Brother    Breast cancer Daughter 32   Colon cancer Neg Hx    Scheduled Meds:  fluticasone   2 spray Each Nare Daily   insulin  aspart  0-9 Units Subcutaneous Q4H  insulin  glargine-yfgn  5 Units Subcutaneous Daily   pantoprazole  (PROTONIX ) IV  40 mg Intravenous Q24H   artificial tears  1 drop Both Eyes QHS   Continuous Infusions:  sodium chloride      cefTRIAXone (ROCEPHIN)  IV 1 g (06/06/23 2118)   PRN Meds:.acetaminophen  **OR** acetaminophen , albuterol , dextromethorphan-guaiFENesin , magnesium  hydroxide, morphine  injection, ondansetron  **OR** ondansetron  (ZOFRAN ) IV Medications Prior to Admission:  Prior to Admission medications   Medication Sig Start Date End Date Taking? Authorizing Provider  acetaminophen  (TYLENOL ) 325 MG tablet Take 1-2 tablets (325-650 mg total) by mouth every 6 (six) hours as needed for mild pain (pain score 1-3 or temp > 100.5). 07/07/22  Yes Coralyn Derry, PA-C  albuterol  (VENTOLIN  HFA) 108 (90 Base) MCG/ACT inhaler Inhale 1-2 puffs into the lungs every 6 (six) hours as needed for wheezing or shortness of breath. 05/19/23  Yes Dellar Fenton, MD  busPIRone  (BUSPAR ) 5 MG tablet Take 1 tablet (5 mg total) by mouth daily. 05/19/23  Yes Dellar Fenton, MD  Continuous Blood Gluc Receiver (FREESTYLE LIBRE 14 DAY READER) DEVI Use 1 each as needed .  Use to test blood sugar 10/25/17  Yes [provider]  Continuous Blood Gluc Sensor (FREESTYLE LIBRE 14 DAY SENSOR) MISC Use 1 each every 14 (fourteen) days 10/25/17  Yes [provider]  dextromethorphan-guaiFENesin  (MUCINEX  DM) 30-600 MG 12hr tablet Take 1 tablet by mouth daily as needed for cough.    Yes [provider]  fexofenadine (ALLEGRA) 180 MG tablet Take 180 mg by mouth daily.   Yes [provider]  fluticasone  (FLONASE ) 50 MCG/ACT nasal spray SPRAY 2 SPRAYS INTO EACH NOSTRIL EVERY DAY 05/19/23  Yes Dellar Fenton, MD  glucagon  (GLUCAGON  EMERGENCY) 1 MG injection Inject 1 mg into the muscle once as needed. 05/21/15  Yes Dellar Fenton, MD  insulin  aspart (NOVOLOG  FLEXPEN) 100 UNIT/ML FlexPen Inject 4-13 Units into the skin 3 (three) times daily with meals. Sliding scale 05/22/20  Yes [provider]  LANTUS  SOLOSTAR 100 UNIT/ML Solostar Pen Inject 20-26 Units into the skin at bedtime. Sliding scale 03/08/17  Yes [provider]  levothyroxine  (SYNTHROID ) 200 MCG tablet TAKE 1 TABLET (200 MCG TOTAL) BY MOUTH DAILY BEFORE BREAKFAST. 05/01/23  Yes Dellar Fenton, MD  MULTIPLE VITAMINS-MINERALS PO Take 1 tablet by mouth daily.   Yes [provider]  Olopatadine  HCl (PATADAY ) 0.7 % SOLN Apply 1 drop to eye daily as needed.   Yes [provider]  pantoprazole  (PROTONIX ) 40 MG tablet Take 1 tablet (40 mg total) by mouth 2 (two) times daily. 05/19/23  Yes Dellar Fenton, MD  Polyethylene Glycol 3350  (MIRALAX  PO) Take 1 Dose by mouth daily as needed.   Yes [provider]  rosuvastatin  (CRESTOR ) 5 MG tablet TAKE 1 TABLET BY MOUTH THREE TIMES A WEEK. 05/19/23  Yes Dellar Fenton, MD  traMADol  (ULTRAM ) 50 MG tablet Take 50 mg by mouth as needed. 02/15/23  Yes [provider]  valsartan  (DIOVAN ) 160 MG tablet Take 1 tablet (160 mg total) by mouth daily. 05/19/23  Yes Dellar Fenton, MD  clobetasol  (TEMOVATE ) 0.05 % external solution Apply 1 Application topically 2 (two) times daily. Patient not taking: Reported on 06/04/2023    [provider]  OVER THE COUNTER MEDICATION Take 2 capsules by mouth daily. Cerebra Brain Health    [provider]  OVER THE COUNTER MEDICATION Take 2 capsules by mouth daily as needed.  Gluco Switch Blood sugar support    [provider]  Polyethyl Glycol-Propyl Glycol (SYSTANE OP) Place 1 drop into both eyes at bedtime.    [provider]  Wheat Dextrin (BENEFIBER) POWD Take 1 Dose by mouth daily as needed.    [provider]   Allergies  Allergen Reactions   Penicillins Rash    IgE = 10 (WNL) on 06/28/2022   Review of Systems  Unable to perform ROS   Physical Exam Constitutional:      Comments: Eyes closed.    Pulmonary:     Effort: Pulmonary effort is normal.  Skin:    General: Skin is warm and dry.     Vital Signs: BP (!) 156/92 (BP Location: Left Arm)   Pulse (!) 107   Temp 98.3 F (36.8 C)   Resp 16   Ht 5\' 2"  (1.575 m)   Wt 79.1 kg   SpO2 98%   BMI 31.90 kg/m  Pain Scale: Faces   Pain Score: Asleep   SpO2: SpO2: 98 % O2 Device:SpO2: 98 % O2 Flow Rate: .   IO: Intake/output summary:  Intake/Output Summary (Last 24 hours) at 06/07/2023 1440 Last data filed at 06/07/2023 0300 Gross per 24 hour  Intake 0 ml  Output --  Net 0 ml    LBM: Last BM Date : 06/06/23 Baseline Weight: Weight: 78.9 kg Most recent weight: Weight: 79.1 kg      Signed by: Meribeth Standard, NP   Please contact Palliative Medicine Team phone at [phone] for questions and concerns.  For individual provider: See Tilford Foley

## 2023-06-08 LAB — KAPPA/LAMBDA LIGHT CHAINS
Kappa free light chain: 526.3 mg/L — ABNORMAL HIGH (ref 3.3–19.4)
Kappa, lambda light chain ratio: 138.5 — ABNORMAL HIGH (ref 0.26–1.65)
Lambda free light chains: 3.8 mg/L — ABNORMAL LOW (ref 5.7–26.3)

## 2023-06-11 LAB — PTH-RELATED PEPTIDE: PTH-related peptide: <2 pmol/L

## 2023-06-12 LAB — MULTIPLE MYELOMA PANEL, SERUM
Albumin SerPl Elph-Mcnc: 3.3 g/dL (ref 2.9–4.4)
Albumin/Glob SerPl: 1.3 (ref 0.7–1.7)
Alpha 1: 0.4 g/dL (ref 0.0–0.4)
Alpha2 Glob SerPl Elph-Mcnc: 0.8 g/dL (ref 0.4–1.0)
B-Globulin SerPl Elph-Mcnc: 1.1 g/dL (ref 0.7–1.3)
Gamma Glob SerPl Elph-Mcnc: 0.4 g/dL (ref 0.4–1.8)
Globulin, Total: 2.6 g/dL (ref 2.2–3.9)
IgA: 325 mg/dL (ref 64–422)
IgG (Immunoglobin G), Serum: 441 mg/dL — ABNORMAL LOW (ref 586–1602)
IgM (Immunoglobulin M), Srm: 16 mg/dL — ABNORMAL LOW (ref 26–217)
Total Protein ELP: 5.9 g/dL — ABNORMAL LOW (ref 6.0–8.5)

## 2023-06-14 ENCOUNTER — Telehealth: Payer: Self-pay | Admitting: Internal Medicine

## 2023-06-14 NOTE — Telephone Encounter (Signed)
 Copied from CRM [id number]. Topic: Appointments - Appointment Cancel/Reschedule >> Jun 14, 2023  8:24 AM Deaijah H wrote: Patients husband called in stating patient will not attend any future appointments due to her passing.

## 2023-06-14 NOTE — Telephone Encounter (Signed)
 Noted

## 2023-06-14 NOTE — Telephone Encounter (Signed)
Patient marked deceased in chart

## 2023-07-04 DEATH — deceased

## 2023-07-12 IMAGING — MR MR KNEE*L* W/O CM
7 series · 40 of 40 positions shown · non-contrast
Comparison: X-ray knee 03/19/2020; MRI left knee 05/10/2010.

CLINICAL DATA: Patient complains of anteromedial left knee pain.
She states she had an injection about two weeks ago that was not
helpful. She notes having 2 prior surgeries.

EXAM:
MRI OF THE LEFT KNEE WITHOUT CONTRAST
TECHNIQUE: Multiplanar, multisequence MR imaging of the knee was performed. No
intravenous contrast was administered.

[Series 8: T2 fat-sat · axial · left · 4.0mm · 0.50mm/px · z∈[-100,+25]mm · 6 of 26 slices shown (1 of 3)]
[im 1/26]
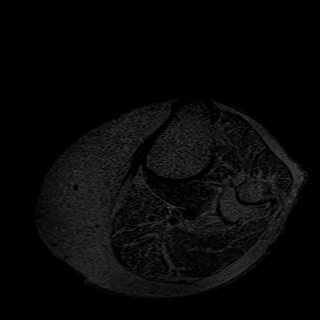
[im 6/26]
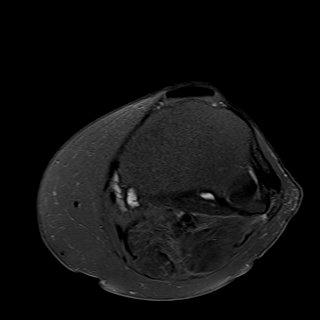
[im 11/26]
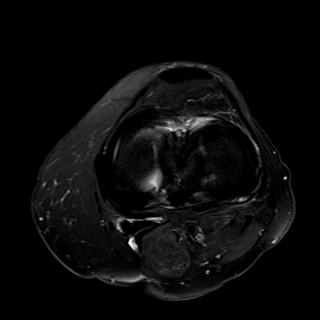
[im 16/26]
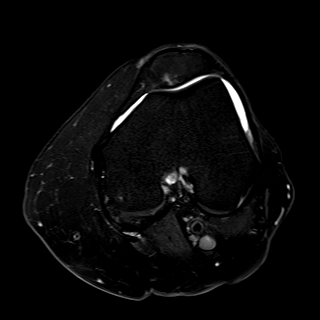
[im 21/26]
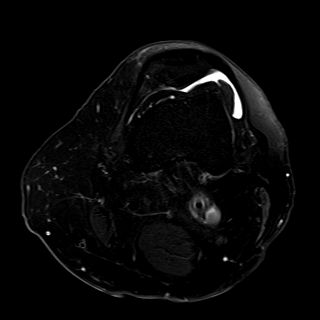
[im 26/26]
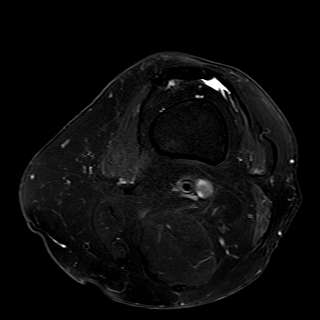

[Series 9: T1 · coronal · left · 4.0mm · 0.47mm/px · 6 of 28 slices shown]
[im 1/28]
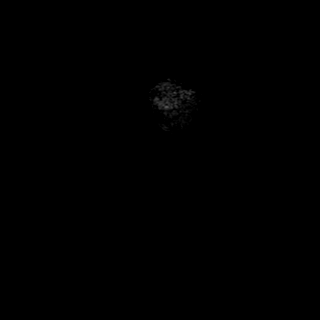
[im 6/28]
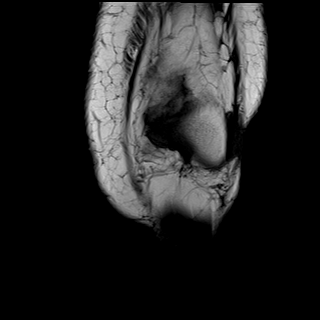
[im 11/28]
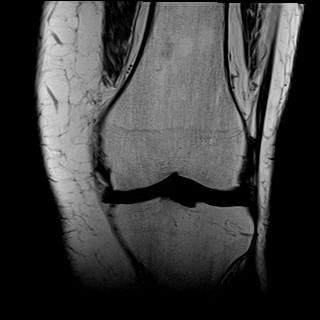
[im 17/28]
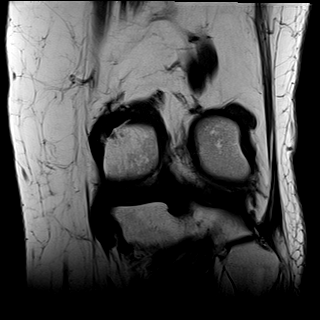
[im 22/28]
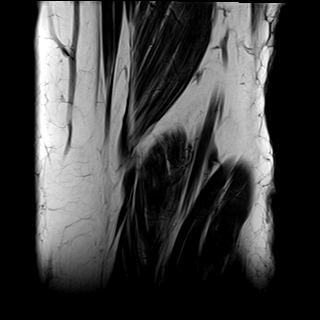
[im 28/28]
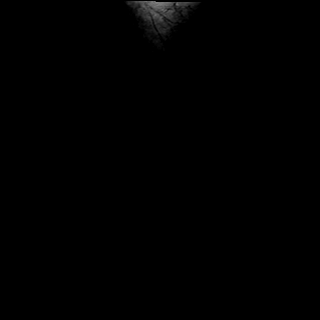

[Series 10: T2 fat-sat · coronal · left · 4.0mm · 0.47mm/px · 6 of 28 slices shown (2 of 3)]
[im 1/28]
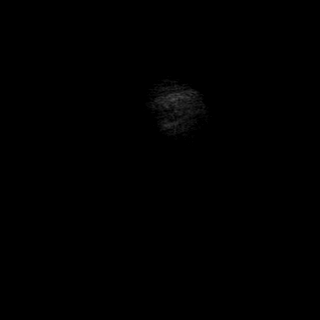
[im 6/28]
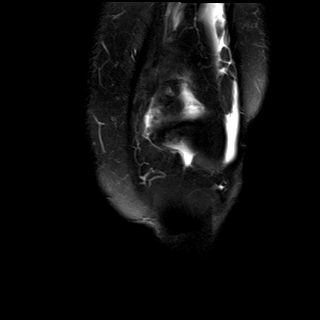
[im 11/28]
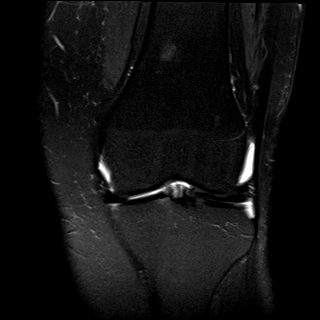
[im 17/28]
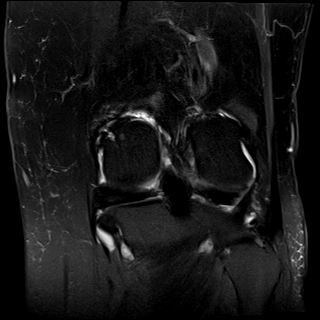
[im 22/28]
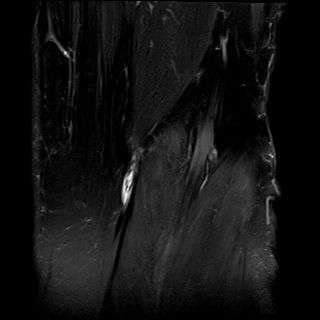
[im 28/28]
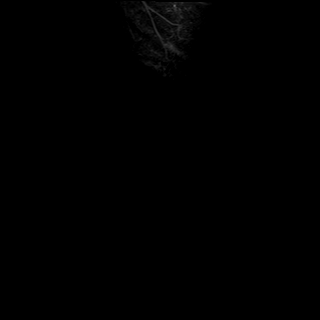

[Series 12: PD fat-sat · sagittal · left · 3.0mm · 0.47mm/px · 6 of 28 slices shown (1 of 2)]
[im 1/28]
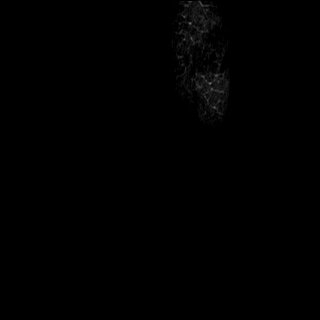
[im 6/28]
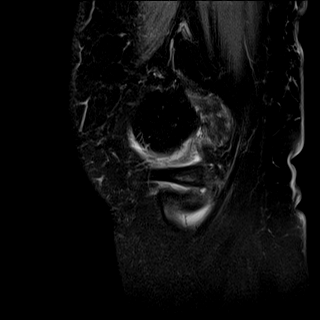
[im 11/28]
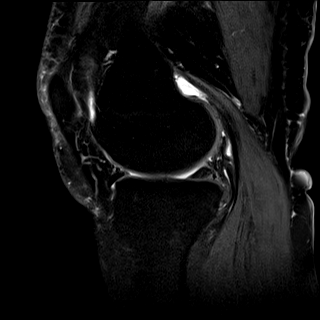
[im 17/28]
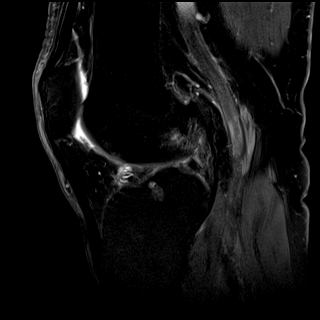
[im 22/28]
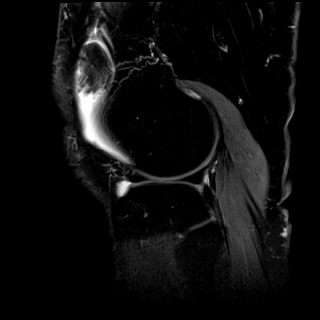
[im 28/28]
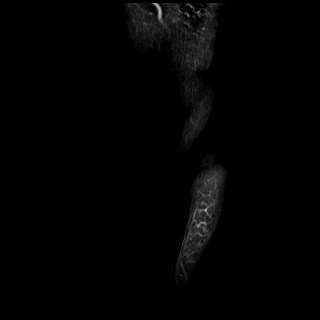

[Series 13: T2 fat-sat · sagittal · left · 3.0mm · 0.47mm/px · 7 of 34 slices shown (3 of 3)]
[im 1/34]
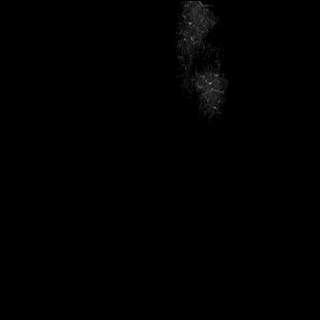
[im 6/34]
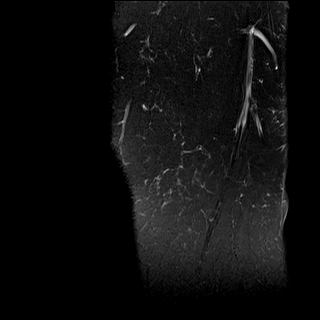
[im 12/34]
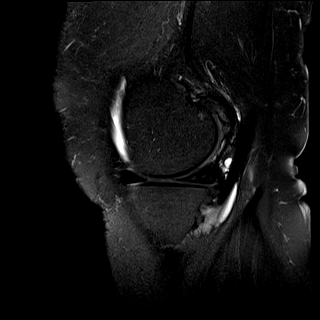
[im 17/34]
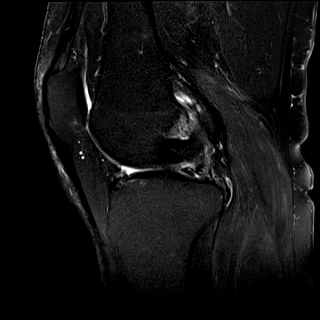
[im 23/34]
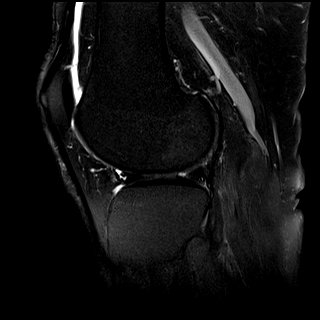
[im 28/34]
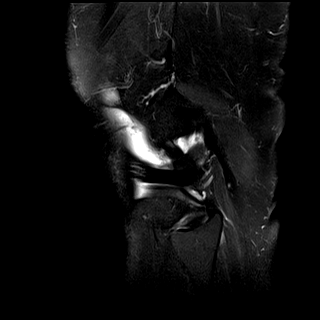
[im 34/34]
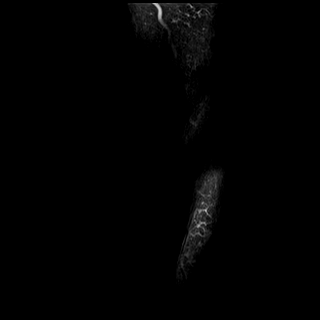

[Series 14: PD · coronal · left · 2.0mm · 0.47mm/px · 3 of 16 slices shown]
[im 1/16]
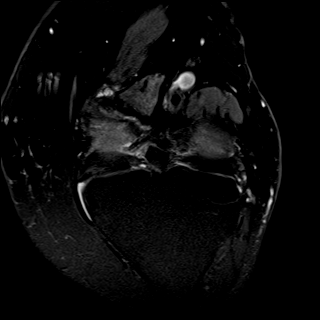
[im 8/16]
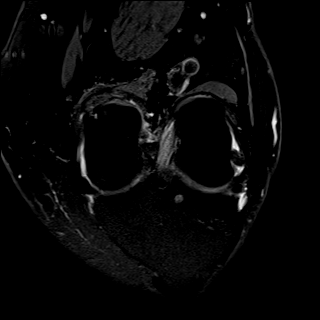
[im 16/16]
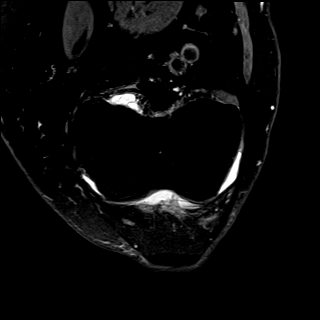

[Series 15: PD fat-sat · coronal · left · 4.0mm · 0.59mm/px · 6 of 28 slices shown (2 of 2)]
[im 1/28]
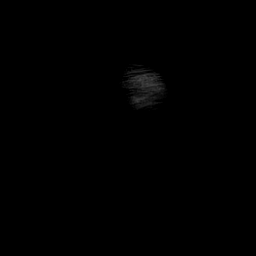
[im 6/28]
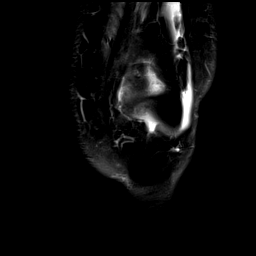
[im 11/28]
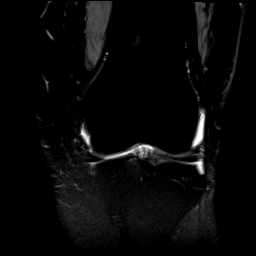
[im 17/28]
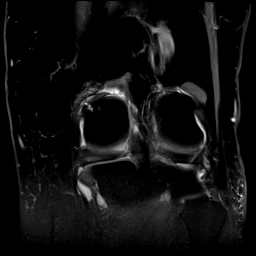
[im 22/28]
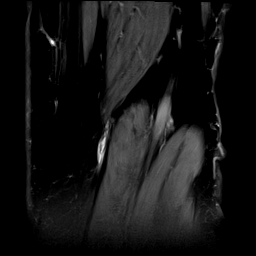
[im 28/28]
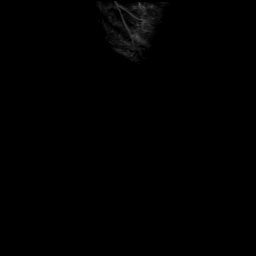

[40 of 40 positions shown; findings below may reference images not displayed]

FINDINGS: MENISCI

Medial: Radial tear of the root of the posterior horn of the medial
meniscus with peripheral meniscal extrusion. Degeneration of the
body of the medial meniscus.

Lateral: Fraying of the free edge of the body of the lateral
meniscus. Partial radial tear of the root of the posterior horn of
the lateral meniscus.

LIGAMENTS

Cruciates: ACL and PCL are intact. Increased signal and expansion of
the ACL as can be seen with mucinous degeneration.

Collaterals: Medial collateral ligament is intact. Lateral
collateral ligament complex is intact.

CARTILAGE

Patellofemoral: Partial-thickness cartilage loss of the inferior
patellar apex with subchondral reactive marrow changes.
Partial-thickness cartilage loss with areas of full-thickness
cartilage loss of the trochlear groove with subchondral reactive
marrow changes.

Medial: Partial-thickness cartilage loss of the medial femorotibial
compartment.

Lateral: Mild partial-thickness cartilage loss of the lateral
femorotibial compartment.

JOINT: Small joint effusion. Normal Mibsham Badruddoza. No plical
thickening.

POPLITEAL FOSSA: Popliteus tendon is intact. No Baker's cyst.

EXTENSOR MECHANISM: Intact quadriceps tendon. Intact patellar
tendon. Intact lateral patellar retinaculum. Intact medial patellar
retinaculum. Intact MPFL.

BONES: No aggressive osseous lesion. No fracture or dislocation.

Other: No fluid collection or hematoma. Muscles are normal.
IMPRESSION: 1. Radial tear of the root of the posterior horn of the medial
meniscus with peripheral meniscal extrusion. Degeneration of the
body of the medial meniscus.
2. Fraying of the free edge of the body of the lateral meniscus.
Partial radial tear of the root of the posterior horn of the lateral
meniscus.
3.  Tricompartmental cartilage abnormalities as described above.

## 2023-07-18 ENCOUNTER — Ambulatory Visit: Admitting: Internal Medicine

## 2024-02-20 ENCOUNTER — Encounter (INDEPENDENT_AMBULATORY_CARE_PROVIDER_SITE_OTHER): Payer: PPO

## 2024-02-20 ENCOUNTER — Ambulatory Visit (INDEPENDENT_AMBULATORY_CARE_PROVIDER_SITE_OTHER): Payer: PPO | Admitting: Vascular Surgery
